# Patient Record
Sex: Male | Born: 1950 | Race: Black or African American | Hispanic: No | Smoking: Never smoker
Health system: Southern US, Community
[De-identification: ages and names within clinical notes are randomized; demographics above are authoritative.]

## PROBLEM LIST (undated history)

## (undated) DIAGNOSIS — K219 Gastro-esophageal reflux disease without esophagitis: Secondary | ICD-10-CM

## (undated) DIAGNOSIS — J449 Chronic obstructive pulmonary disease, unspecified: Secondary | ICD-10-CM

## (undated) DIAGNOSIS — D649 Anemia, unspecified: Secondary | ICD-10-CM

## (undated) DIAGNOSIS — K7030 Alcoholic cirrhosis of liver without ascites: Secondary | ICD-10-CM

## (undated) DIAGNOSIS — A419 Sepsis, unspecified organism: Secondary | ICD-10-CM

## (undated) DIAGNOSIS — K3182 Dieulafoy lesion (hemorrhagic) of stomach and duodenum: Secondary | ICD-10-CM

## (undated) DIAGNOSIS — I10 Essential (primary) hypertension: Secondary | ICD-10-CM

## (undated) DIAGNOSIS — R7303 Prediabetes: Secondary | ICD-10-CM

## (undated) DIAGNOSIS — R578 Other shock: Secondary | ICD-10-CM

## (undated) DIAGNOSIS — K922 Gastrointestinal hemorrhage, unspecified: Secondary | ICD-10-CM

## (undated) DIAGNOSIS — I4891 Unspecified atrial fibrillation: Secondary | ICD-10-CM

## (undated) DIAGNOSIS — Z87442 Personal history of urinary calculi: Secondary | ICD-10-CM

## (undated) DIAGNOSIS — N400 Enlarged prostate without lower urinary tract symptoms: Secondary | ICD-10-CM

## (undated) DIAGNOSIS — R0600 Dyspnea, unspecified: Secondary | ICD-10-CM

## (undated) DIAGNOSIS — N289 Disorder of kidney and ureter, unspecified: Secondary | ICD-10-CM

## (undated) HISTORY — DX: Other shock: R57.8

## (undated) HISTORY — DX: Essential (primary) hypertension: I10

## (undated) HISTORY — DX: Gastrointestinal hemorrhage, unspecified: K92.2

## (undated) HISTORY — DX: Dieulafoy lesion (hemorrhagic) of stomach and duodenum: K31.82

## (undated) HISTORY — DX: Benign prostatic hyperplasia without lower urinary tract symptoms: N40.0

## (undated) HISTORY — DX: Alcoholic cirrhosis of liver without ascites: K70.30

## (undated) HISTORY — DX: Gastro-esophageal reflux disease without esophagitis: K21.9

## (undated) HISTORY — PX: KIDNEY SURGERY: SHX687

---
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
## 2007-08-16 ENCOUNTER — Ambulatory Visit: Payer: Self-pay | Admitting: Nurse Practitioner

## 2007-09-08 ENCOUNTER — Ambulatory Visit: Payer: Self-pay | Admitting: Family Medicine

## 2007-09-29 ENCOUNTER — Ambulatory Visit: Payer: Self-pay | Admitting: Family Medicine

## 2009-02-05 ENCOUNTER — Ambulatory Visit: Payer: Self-pay | Admitting: Adult Health

## 2009-02-14 ENCOUNTER — Emergency Department: Payer: Self-pay | Admitting: Emergency Medicine

## 2009-02-26 ENCOUNTER — Emergency Department: Payer: Self-pay | Admitting: Emergency Medicine

## 2009-03-05 ENCOUNTER — Emergency Department: Payer: Self-pay | Admitting: Emergency Medicine

## 2009-07-19 ENCOUNTER — Ambulatory Visit: Payer: Self-pay | Admitting: Cardiology

## 2009-07-19 ENCOUNTER — Inpatient Hospital Stay (HOSPITAL_COMMUNITY): Admission: EM | Admit: 2009-07-19 | Discharge: 2009-07-21 | Payer: Self-pay | Admitting: Emergency Medicine

## 2009-08-22 ENCOUNTER — Emergency Department: Payer: Self-pay | Admitting: Emergency Medicine

## 2009-08-24 ENCOUNTER — Emergency Department: Payer: Self-pay | Admitting: Unknown Physician Specialty

## 2010-05-28 LAB — CBC
Hemoglobin: 12.5 g/dL — ABNORMAL LOW (ref 13.0–17.0)
Hemoglobin: 14 g/dL (ref 13.0–17.0)
MCHC: 35 g/dL (ref 30.0–36.0)
MCV: 99.1 fL (ref 78.0–100.0)
MCV: 99.7 fL (ref 78.0–100.0)
Platelets: 161 10*3/uL (ref 150–400)
Platelets: 197 10*3/uL (ref 150–400)
RBC: 3.47 MIL/uL — ABNORMAL LOW (ref 4.22–5.81)
RBC: 3.59 MIL/uL — ABNORMAL LOW (ref 4.22–5.81)
RDW: 13.2 % (ref 11.5–15.5)
RDW: 13.6 % (ref 11.5–15.5)
WBC: 5.4 10*3/uL (ref 4.0–10.5)
WBC: 5.9 10*3/uL (ref 4.0–10.5)

## 2010-05-28 LAB — LIPID PANEL: HDL: 58 mg/dL (ref >39)

## 2010-05-28 LAB — CK TOTAL AND CKMB (NOT AT ARMC)
CK, MB: 0.9 ng/mL (ref 0.3–4.0)
CK, MB: 0.9 ng/mL (ref 0.3–4.0)
CK, MB: 1 ng/mL (ref 0.3–4.0)
Relative Index: 0.4 (ref 0.0–2.5)
Relative Index: 0.5 (ref 0.0–2.5)
Relative Index: 0.5 (ref 0.0–2.5)
Total CK: 170 U/L (ref 7–232)
Total CK: 194 U/L (ref 7–232)

## 2010-05-28 LAB — COMPREHENSIVE METABOLIC PANEL
ALT: 30 U/L (ref 0–53)
Alkaline Phosphatase: 59 U/L (ref 39–117)
BUN: 10 mg/dL (ref 6–23)
Creatinine, Ser: 0.87 mg/dL (ref 0.4–1.5)
GFR calc non Af Amer: >60 mL/min (ref >60)
Potassium: 3.9 mEq/L (ref 3.5–5.1)
Total Bilirubin: 1.1 mg/dL (ref 0.3–1.2)
Total Protein: 7 g/dL (ref 6.0–8.3)

## 2010-05-28 LAB — D-DIMER, QUANTITATIVE: D-Dimer, Quant: <0.22 ug/mL-FEU (ref 0.00–0.48)

## 2010-05-28 LAB — TROPONIN I: Troponin I: 0.02 ng/mL (ref 0.00–0.06)

## 2010-05-28 LAB — DIFFERENTIAL
Basophils Absolute: 0 10*3/uL (ref 0.0–0.1)
Basophils Relative: 0 % (ref 0–1)
Eosinophils Absolute: 0.1 10*3/uL (ref 0.0–0.7)
Monocytes Relative: 10 % (ref 3–12)

## 2010-05-28 LAB — BASIC METABOLIC PANEL
CO2: 27 mEq/L (ref 19–32)
Creatinine, Ser: 0.87 mg/dL (ref 0.4–1.5)
Potassium: 3.2 mEq/L — ABNORMAL LOW (ref 3.5–5.1)
Sodium: 133 mEq/L — ABNORMAL LOW (ref 135–145)

## 2010-05-28 LAB — POCT I-STAT, CHEM 8
BUN: 15 mg/dL (ref 6–23)
Chloride: 105 mEq/L (ref 96–112)
HCT: 43 % (ref 39.0–52.0)
Hemoglobin: 14.6 g/dL (ref 13.0–17.0)
Sodium: 141 mEq/L (ref 135–145)
TCO2: 28 mmol/L (ref 0–100)

## 2010-05-28 LAB — POCT CARDIAC MARKERS
CKMB, poc: <1 ng/mL — ABNORMAL LOW (ref 1.0–8.0)
Myoglobin, poc: 64.3 ng/mL (ref 12–200)

## 2010-06-20 DIAGNOSIS — E785 Hyperlipidemia, unspecified: Secondary | ICD-10-CM | POA: Insufficient documentation

## 2010-06-20 LAB — PSA: PSA: 1.8

## 2010-09-10 ENCOUNTER — Ambulatory Visit: Payer: Self-pay

## 2011-02-13 DIAGNOSIS — K219 Gastro-esophageal reflux disease without esophagitis: Secondary | ICD-10-CM | POA: Insufficient documentation

## 2012-01-29 LAB — HEMOGLOBIN A1C: HEMOGLOBIN A1C: 5.4

## 2012-08-17 LAB — LIPID PANEL
CHOLESTEROL: 129 mg/dL (ref 0–200)
HDL: 91 mg/dL — AB (ref 35–70)
LDL Cholesterol: 24 mg/dL
Triglycerides: 70 mg/dL (ref 40–160)

## 2013-06-02 LAB — BASIC METABOLIC PANEL
BUN: 24 mg/dL — AB (ref 4–21)
CREATININE: 1.1 mg/dL (ref 0.6–1.3)
GLUCOSE: 101 mg/dL
POTASSIUM: 4.5 mmol/L (ref 3.4–5.3)
SODIUM: 145 mmol/L (ref 137–147)

## 2013-06-02 LAB — CBC AND DIFFERENTIAL
HCT: 42 % (ref 41–53)
HEMOGLOBIN: 13.9 g/dL (ref 13.5–17.5)
Neutrophils Absolute: 2 /uL
Platelets: 180 10*3/uL (ref 150–399)
WBC: 4.3 10*3/mL

## 2013-06-02 LAB — HEPATIC FUNCTION PANEL
ALT: 58 U/L — AB (ref 10–40)
AST: 72 U/L — AB (ref 14–40)
Alkaline Phosphatase: 78 U/L (ref 25–125)
Bilirubin, Total: 0.7 mg/dL

## 2013-06-02 LAB — TSH: TSH: 1.4 u[IU]/mL (ref 0.41–5.90)

## 2013-12-08 DIAGNOSIS — F1010 Alcohol abuse, uncomplicated: Secondary | ICD-10-CM | POA: Insufficient documentation

## 2013-12-08 DIAGNOSIS — N400 Enlarged prostate without lower urinary tract symptoms: Secondary | ICD-10-CM

## 2013-12-08 DIAGNOSIS — I10 Essential (primary) hypertension: Secondary | ICD-10-CM | POA: Insufficient documentation

## 2013-12-08 DIAGNOSIS — N401 Enlarged prostate with lower urinary tract symptoms: Secondary | ICD-10-CM | POA: Insufficient documentation

## 2014-03-20 ENCOUNTER — Ambulatory Visit: Payer: Self-pay | Admitting: Gastroenterology

## 2014-07-03 LAB — SURGICAL PATHOLOGY

## 2014-07-09 NOTE — Consult Note (Signed)
   Present Illness 64 year old male with history of hypertension and benign prostatic hypertrophy been no cardiac history he was noted to have atrial fibrillation after a colonoscopy.  Patient had been treated with metoprolol succinate and and enteric-coated aspirin as an outpatient.  He drinks approximately 1 tight of vodka today.  He was in atrial fibrillation with controlled ventricular response post procedure.  He had no chest pain or shortness of breath.  He was given 20 mg of IV Cardizem with conversion back to normal sinus rhythm.  There were no ischemic changes on his electrocardiogram.   Physical Exam:  GEN no acute distress   HEENT PERRL   NECK supple  No masses   RESP clear BS   CARD Irregular rate and rhythm  Normal, S1, S2  No murmur   ABD denies tenderness  normal BS  no Adominal Mass   LYMPH negative neck, negative axillae   EXTR negative cyanosis/clubbing, negative edema   SKIN normal to palpation   NEURO cranial nerves intact, motor/sensory function intact   PSYCH A+O to time, place, person   Review of Systems:  Subjective/Chief Complaint No chest pain or shortness of breath   General: No Complaints   Skin: No Complaints   ENT: No Complaints   Eyes: No Complaints   Neck: No Complaints   Respiratory: No Complaints   Cardiovascular: No Complaints   Gastrointestinal: No Complaints   Genitourinary: No Complaints   Vascular: No Complaints   Musculoskeletal: No Complaints   Neurologic: No Complaints   Hematologic: No Complaints   Endocrine: No Complaints   Psychiatric: No Complaints   Review of Systems: All other systems were reviewed and found to be negative   Medications/Allergies Reviewed Medications/Allergies reviewed   Family & Social History:  Family and Social History:  Family History Non-Contributory   Social History positive ETOH   Place of Living Home   EKG:  Interpretation initial atrial fibrillation with variable  ventricular response.  Converted to normal sinus rhythm with IV Cardizem.  There were no ischemic changes.    No Known Allergies:    Impression 64 year old male with history of hypertension and ethanol abuse to was noted be in atrial fibrillation post colonoscopy.  He is converted back to sinus rhythm with IV Cardizem.  He was hemodynamically stable he in atrial fibrillation and remains so in sinus rhythm.  He is currently without symptoms.  He has no chest pain or shortness of breath.  He is on metoprolol succinate at home now will continue this drug.  Will see him as an outpatient if needed.   Plan 1. Continue with current home medications including metoprolol succinate 50 mg twice daily 2. Continue with enteric-coated aspirin when acceptable from a GI standpoint 3. Reduced ethanol intake 4. Will follow-up as an outpatient if desired   Electronic Signatures: Dalia HeadingFath, Mavis Fichera A (MD)  (Signed 11-Jan-16 10:10)  Authored: General Aspect/Present Illness, History and Physical Exam, Review of System, Family & Social History, EKG , Allergies, Impression/Plan   Last Updated: 11-Jan-16 10:10 by Dalia HeadingFath, Jenniferlynn Saad A (MD)

## 2014-12-14 ENCOUNTER — Other Ambulatory Visit: Payer: Self-pay | Admitting: Family Medicine

## 2014-12-14 DIAGNOSIS — R748 Abnormal levels of other serum enzymes: Secondary | ICD-10-CM

## 2014-12-21 ENCOUNTER — Ambulatory Visit
Admission: RE | Admit: 2014-12-21 | Discharge: 2014-12-21 | Disposition: A | Discharge status: Home / Self Care | Payer: Medicare HMO | Source: Ambulatory Visit | Attending: Family Medicine | Admitting: Family Medicine

## 2014-12-21 DIAGNOSIS — R748 Abnormal levels of other serum enzymes: Secondary | ICD-10-CM | POA: Diagnosis present

## 2014-12-21 DIAGNOSIS — K8020 Calculus of gallbladder without cholecystitis without obstruction: Secondary | ICD-10-CM | POA: Insufficient documentation

## 2015-07-19 DIAGNOSIS — K219 Gastro-esophageal reflux disease without esophagitis: Secondary | ICD-10-CM

## 2015-07-19 DIAGNOSIS — E785 Hyperlipidemia, unspecified: Secondary | ICD-10-CM

## 2018-02-15 ENCOUNTER — Emergency Department: Payer: Medicare HMO | Admitting: Registered Nurse

## 2018-02-15 ENCOUNTER — Inpatient Hospital Stay
Admission: EM | Admit: 2018-02-15 | Discharge: 2018-02-19 | DRG: 377 | Disposition: A | Discharge status: Home / Self Care | Payer: Medicare HMO | Attending: Internal Medicine | Admitting: Internal Medicine

## 2018-02-15 ENCOUNTER — Emergency Department: Payer: Medicare HMO

## 2018-02-15 ENCOUNTER — Encounter
Admission: EM | Disposition: A | Discharge status: Home / Self Care | Payer: Self-pay | Source: Home / Self Care | Attending: Family Medicine

## 2018-02-15 ENCOUNTER — Encounter: Payer: Self-pay | Admitting: Emergency Medicine

## 2018-02-15 ENCOUNTER — Other Ambulatory Visit: Payer: Self-pay

## 2018-02-15 DIAGNOSIS — D6959 Other secondary thrombocytopenia: Secondary | ICD-10-CM | POA: Diagnosis present

## 2018-02-15 DIAGNOSIS — K8020 Calculus of gallbladder without cholecystitis without obstruction: Secondary | ICD-10-CM | POA: Diagnosis present

## 2018-02-15 DIAGNOSIS — K922 Gastrointestinal hemorrhage, unspecified: Secondary | ICD-10-CM | POA: Diagnosis not present

## 2018-02-15 DIAGNOSIS — N400 Enlarged prostate without lower urinary tract symptoms: Secondary | ICD-10-CM | POA: Diagnosis present

## 2018-02-15 DIAGNOSIS — I4891 Unspecified atrial fibrillation: Secondary | ICD-10-CM | POA: Diagnosis not present

## 2018-02-15 DIAGNOSIS — F10239 Alcohol dependence with withdrawal, unspecified: Secondary | ICD-10-CM | POA: Diagnosis present

## 2018-02-15 DIAGNOSIS — R578 Other shock: Secondary | ICD-10-CM | POA: Diagnosis present

## 2018-02-15 DIAGNOSIS — Z23 Encounter for immunization: Secondary | ICD-10-CM | POA: Diagnosis not present

## 2018-02-15 DIAGNOSIS — Z8249 Family history of ischemic heart disease and other diseases of the circulatory system: Secondary | ICD-10-CM

## 2018-02-15 DIAGNOSIS — K2211 Ulcer of esophagus with bleeding: Secondary | ICD-10-CM | POA: Diagnosis present

## 2018-02-15 DIAGNOSIS — K219 Gastro-esophageal reflux disease without esophagitis: Secondary | ICD-10-CM | POA: Diagnosis present

## 2018-02-15 DIAGNOSIS — G9340 Encephalopathy, unspecified: Secondary | ICD-10-CM | POA: Diagnosis present

## 2018-02-15 DIAGNOSIS — F1010 Alcohol abuse, uncomplicated: Secondary | ICD-10-CM

## 2018-02-15 DIAGNOSIS — K3182 Dieulafoy lesion (hemorrhagic) of stomach and duodenum: Principal | ICD-10-CM | POA: Diagnosis present

## 2018-02-15 DIAGNOSIS — K921 Melena: Secondary | ICD-10-CM

## 2018-02-15 DIAGNOSIS — I10 Essential (primary) hypertension: Secondary | ICD-10-CM | POA: Diagnosis present

## 2018-02-15 DIAGNOSIS — Z79899 Other long term (current) drug therapy: Secondary | ICD-10-CM | POA: Diagnosis not present

## 2018-02-15 DIAGNOSIS — K449 Diaphragmatic hernia without obstruction or gangrene: Secondary | ICD-10-CM | POA: Diagnosis present

## 2018-02-15 DIAGNOSIS — D62 Acute posthemorrhagic anemia: Secondary | ICD-10-CM | POA: Diagnosis present

## 2018-02-15 DIAGNOSIS — E872 Acidosis: Secondary | ICD-10-CM | POA: Diagnosis present

## 2018-02-15 DIAGNOSIS — N179 Acute kidney failure, unspecified: Secondary | ICD-10-CM | POA: Diagnosis present

## 2018-02-15 DIAGNOSIS — I248 Other forms of acute ischemic heart disease: Secondary | ICD-10-CM | POA: Diagnosis present

## 2018-02-15 DIAGNOSIS — Z7982 Long term (current) use of aspirin: Secondary | ICD-10-CM

## 2018-02-15 DIAGNOSIS — D696 Thrombocytopenia, unspecified: Secondary | ICD-10-CM

## 2018-02-15 HISTORY — DX: Gastrointestinal hemorrhage, unspecified: K92.2

## 2018-02-15 HISTORY — PX: ESOPHAGOGASTRODUODENOSCOPY: SHX5428

## 2018-02-15 LAB — CBC
HEMATOCRIT: 24 % — AB (ref 39.0–52.0)
Hemoglobin: 7.9 g/dL — ABNORMAL LOW (ref 13.0–17.0)
MCH: 37.3 pg — ABNORMAL HIGH (ref 26.0–34.0)
MCHC: 32.9 g/dL (ref 30.0–36.0)
MCV: 113.2 fL — ABNORMAL HIGH (ref 80.0–100.0)
Platelets: 134 10*3/uL — ABNORMAL LOW (ref 150–400)
RBC: 2.12 MIL/uL — ABNORMAL LOW (ref 4.22–5.81)
RDW: 13 % (ref 11.5–15.5)
WBC: 8.7 10*3/uL (ref 4.0–10.5)
nRBC: 0 % (ref 0.0–0.2)

## 2018-02-15 LAB — DIFFERENTIAL
Abs Immature Granulocytes: 0.04 10*3/uL (ref 0.00–0.07)
Basophils Absolute: 0 10*3/uL (ref 0.0–0.1)
Basophils Relative: 1 %
Eosinophils Absolute: 0.2 10*3/uL (ref 0.0–0.5)
Eosinophils Relative: 2 %
Immature Granulocytes: 1 %
LYMPHS ABS: 1.2 10*3/uL (ref 0.7–4.0)
LYMPHS PCT: 14 %
Monocytes Absolute: 0.8 10*3/uL (ref 0.1–1.0)
Monocytes Relative: 9 %
Neutro Abs: 6.4 10*3/uL (ref 1.7–7.7)
Neutrophils Relative %: 73 %
Smear Review: NORMAL

## 2018-02-15 LAB — HEMOGLOBIN AND HEMATOCRIT, BLOOD
HCT: 37.8 % — ABNORMAL LOW (ref 39.0–52.0)
Hemoglobin: 12.6 g/dL — ABNORMAL LOW (ref 13.0–17.0)

## 2018-02-15 LAB — PROTIME-INR
INR: 1.29
Prothrombin Time: 16 seconds — ABNORMAL HIGH (ref 11.4–15.2)

## 2018-02-15 LAB — PROCALCITONIN: PROCALCITONIN: 0.19 ng/mL

## 2018-02-15 LAB — COMPREHENSIVE METABOLIC PANEL
ALK PHOS: 77 U/L (ref 38–126)
ALT: 32 U/L (ref 0–44)
AST: 54 U/L — ABNORMAL HIGH (ref 15–41)
Albumin: 3.1 g/dL — ABNORMAL LOW (ref 3.5–5.0)
Anion gap: 10 (ref 5–15)
BUN: 63 mg/dL — ABNORMAL HIGH (ref 8–23)
CALCIUM: 9 mg/dL (ref 8.9–10.3)
CHLORIDE: 110 mmol/L (ref 98–111)
CO2: 22 mmol/L (ref 22–32)
CREATININE: 1.36 mg/dL — AB (ref 0.61–1.24)
GFR calc Af Amer: >60 mL/min (ref >60)
GFR, EST NON AFRICAN AMERICAN: 53 mL/min — AB (ref >60)
Glucose, Bld: 172 mg/dL — ABNORMAL HIGH (ref 70–99)
Potassium: 3.7 mmol/L (ref 3.5–5.1)
Sodium: 142 mmol/L (ref 135–145)
Total Bilirubin: 1.6 mg/dL — ABNORMAL HIGH (ref 0.3–1.2)
Total Protein: 6 g/dL — ABNORMAL LOW (ref 6.5–8.1)

## 2018-02-15 LAB — AMMONIA: Ammonia: 18 umol/L (ref 9–35)

## 2018-02-15 LAB — TROPONIN I
Troponin I: 0.13 ng/mL (ref <0.03)
Troponin I: 0.15 ng/mL (ref <0.03)

## 2018-02-15 LAB — CG4 I-STAT (LACTIC ACID): LACTIC ACID, VENOUS: 5.58 mmol/L — AB (ref 0.5–1.9)

## 2018-02-15 LAB — APTT: APTT: 27 s (ref 24–36)

## 2018-02-15 LAB — ABO/RH: ABO/RH(D): B POS

## 2018-02-15 LAB — LACTIC ACID, PLASMA: Lactic Acid, Venous: 3.7 mmol/L (ref 0.5–1.9)

## 2018-02-15 LAB — MRSA PCR SCREENING: MRSA by PCR: NEGATIVE

## 2018-02-15 SURGERY — EGD (ESOPHAGOGASTRODUODENOSCOPY)
Anesthesia: General

## 2018-02-15 MED ORDER — LIDOCAINE HCL (PF) 2 % IJ SOLN
INTRAMUSCULAR | Status: AC
  Filled 2018-02-15: qty 10

## 2018-02-15 MED ORDER — DEXMEDETOMIDINE HCL IN NACL 400 MCG/100ML IV SOLN
0.2000 ug/kg/h | INTRAVENOUS | Status: DC
  Administered 2018-02-15: 0.3 ug/kg/h via INTRAVENOUS
  Filled 2018-02-15: qty 100

## 2018-02-15 MED ORDER — FENTANYL CITRATE (PF) 100 MCG/2ML IJ SOLN
INTRAMUSCULAR | Status: DC | PRN
  Administered 2018-02-15: 50 ug via INTRAVENOUS

## 2018-02-15 MED ORDER — PROPOFOL 10 MG/ML IV BOLUS
INTRAVENOUS | Status: AC
  Filled 2018-02-15: qty 20

## 2018-02-15 MED ORDER — DEXAMETHASONE SODIUM PHOSPHATE 10 MG/ML IJ SOLN
INTRAMUSCULAR | Status: DC | PRN
  Administered 2018-02-15: 5 mg via INTRAVENOUS

## 2018-02-15 MED ORDER — PHENYLEPHRINE HCL 10 MG/ML IJ SOLN
INTRAMUSCULAR | Status: DC | PRN
  Administered 2018-02-15 (×4): 100 ug via INTRAVENOUS

## 2018-02-15 MED ORDER — SODIUM CHLORIDE 0.9 % IV BOLUS
500.0000 mL | Freq: Once | INTRAVENOUS | Status: AC
  Administered 2018-02-15: 500 mL via INTRAVENOUS

## 2018-02-15 MED ORDER — MIDAZOLAM HCL 2 MG/2ML IJ SOLN
INTRAMUSCULAR | Status: AC
  Filled 2018-02-15: qty 2

## 2018-02-15 MED ORDER — ADULT MULTIVITAMIN W/MINERALS CH
1.0000 | ORAL_TABLET | Freq: Every day | ORAL | Status: DC
  Administered 2018-02-17 – 2018-02-19 (×3): 1 via ORAL
  Filled 2018-02-15 (×3): qty 1

## 2018-02-15 MED ORDER — FOLIC ACID 1 MG PO TABS
1.0000 mg | ORAL_TABLET | Freq: Every day | ORAL | Status: DC
  Administered 2018-02-17 – 2018-02-19 (×3): 1 mg via ORAL
  Filled 2018-02-15 (×3): qty 1

## 2018-02-15 MED ORDER — LIDOCAINE HCL (CARDIAC) PF 100 MG/5ML IV SOSY
PREFILLED_SYRINGE | INTRAVENOUS | Status: DC | PRN
  Administered 2018-02-15: 80 mg via INTRAVENOUS

## 2018-02-15 MED ORDER — LORAZEPAM 1 MG PO TABS: 1.0000 mg | ORAL_TABLET | Freq: Four times a day (QID) | ORAL | Status: DC | PRN

## 2018-02-15 MED ORDER — PROPOFOL 10 MG/ML IV BOLUS
INTRAVENOUS | Status: DC | PRN
  Administered 2018-02-15: 100 mg via INTRAVENOUS

## 2018-02-15 MED ORDER — PANTOPRAZOLE SODIUM 40 MG IV SOLR: 40.0000 mg | Freq: Two times a day (BID) | INTRAVENOUS | Status: DC

## 2018-02-15 MED ORDER — OCTREOTIDE LOAD VIA INFUSION
50.0000 ug | Freq: Once | INTRAVENOUS | Status: DC
  Filled 2018-02-15: qty 25

## 2018-02-15 MED ORDER — VITAMIN B-1 100 MG PO TABS
100.0000 mg | ORAL_TABLET | Freq: Every day | ORAL | Status: DC
  Administered 2018-02-17 – 2018-02-19 (×3): 100 mg via ORAL
  Filled 2018-02-15 (×3): qty 1

## 2018-02-15 MED ORDER — ALBUTEROL SULFATE (2.5 MG/3ML) 0.083% IN NEBU: 2.5000 mg | INHALATION_SOLUTION | RESPIRATORY_TRACT | Status: DC

## 2018-02-15 MED ORDER — ONDANSETRON HCL 4 MG/2ML IJ SOLN
INTRAMUSCULAR | Status: AC
  Administered 2018-02-15: 4 mg
  Filled 2018-02-15: qty 2

## 2018-02-15 MED ORDER — DEXAMETHASONE SODIUM PHOSPHATE 10 MG/ML IJ SOLN
INTRAMUSCULAR | Status: AC
  Filled 2018-02-15: qty 1

## 2018-02-15 MED ORDER — SODIUM CHLORIDE 0.9 % IV SOLN
10.0000 mL/h | Freq: Once | INTRAVENOUS | Status: AC
  Administered 2018-02-15: 10 mL/h via INTRAVENOUS

## 2018-02-15 MED ORDER — SODIUM CHLORIDE 0.9 % IV SOLN
50.0000 ug/h | INTRAVENOUS | Status: DC
  Administered 2018-02-15 – 2018-02-19 (×7): 50 ug/h via INTRAVENOUS
  Filled 2018-02-15 (×13): qty 1

## 2018-02-15 MED ORDER — ACETAMINOPHEN 325 MG PO TABS: 650.0000 mg | ORAL_TABLET | ORAL | Status: DC | PRN

## 2018-02-15 MED ORDER — THIAMINE HCL 100 MG/ML IJ SOLN
100.0000 mg | Freq: Once | INTRAMUSCULAR | Status: AC
  Administered 2018-02-15: 100 mg via INTRAVENOUS
  Filled 2018-02-15 (×2): qty 2

## 2018-02-15 MED ORDER — LORAZEPAM 2 MG/ML IJ SOLN: 1.0000 mg | INTRAMUSCULAR | Status: DC | PRN

## 2018-02-15 MED ORDER — FENTANYL CITRATE (PF) 100 MCG/2ML IJ SOLN
INTRAMUSCULAR | Status: AC
  Filled 2018-02-15: qty 2

## 2018-02-15 MED ORDER — ONDANSETRON HCL 4 MG/2ML IJ SOLN
INTRAMUSCULAR | Status: AC
  Filled 2018-02-15: qty 2

## 2018-02-15 MED ORDER — SODIUM CHLORIDE 0.9 % IV SOLN
8.0000 mg/h | INTRAVENOUS | Status: AC
  Administered 2018-02-15 – 2018-02-18 (×6): 8 mg/h via INTRAVENOUS
  Filled 2018-02-15 (×8): qty 80

## 2018-02-15 MED ORDER — THIAMINE HCL 100 MG/ML IJ SOLN
100.0000 mg | Freq: Every day | INTRAMUSCULAR | Status: DC
  Administered 2018-02-15 – 2018-02-16 (×2): 100 mg via INTRAVENOUS
  Filled 2018-02-15: qty 1
  Filled 2018-02-15: qty 2
  Filled 2018-02-15: qty 1

## 2018-02-15 MED ORDER — SODIUM CHLORIDE 0.9 % IV BOLUS
1000.0000 mL | Freq: Once | INTRAVENOUS | Status: AC
  Administered 2018-02-15: 1000 mL via INTRAVENOUS

## 2018-02-15 MED ORDER — SUCCINYLCHOLINE CHLORIDE 20 MG/ML IJ SOLN
INTRAMUSCULAR | Status: DC | PRN
  Administered 2018-02-15: 100 mg via INTRAVENOUS

## 2018-02-15 MED ORDER — ALBUTEROL SULFATE (2.5 MG/3ML) 0.083% IN NEBU: 2.5000 mg | INHALATION_SOLUTION | RESPIRATORY_TRACT | Status: DC | PRN

## 2018-02-15 MED ORDER — TAMSULOSIN HCL 0.4 MG PO CAPS
0.4000 mg | ORAL_CAPSULE | Freq: Every day | ORAL | Status: DC
  Administered 2018-02-17 – 2018-02-18 (×2): 0.4 mg via ORAL
  Filled 2018-02-15 (×2): qty 1

## 2018-02-15 MED ORDER — LACTATED RINGERS IV SOLN
INTRAVENOUS | Status: DC | PRN
  Administered 2018-02-15: 19:00:00 via INTRAVENOUS

## 2018-02-15 MED ORDER — LORAZEPAM 2 MG/ML IJ SOLN
1.0000 mg | Freq: Four times a day (QID) | INTRAMUSCULAR | Status: DC | PRN
  Administered 2018-02-15: 1 mg via INTRAVENOUS
  Filled 2018-02-15: qty 1

## 2018-02-15 MED ORDER — ONDANSETRON HCL 4 MG/2ML IJ SOLN
INTRAMUSCULAR | Status: DC | PRN
  Administered 2018-02-15: 4 mg via INTRAVENOUS

## 2018-02-15 MED ORDER — SODIUM CHLORIDE 0.9 % IV SOLN
1.0000 g | INTRAVENOUS | Status: DC
  Administered 2018-02-15: 1 g via INTRAVENOUS
  Filled 2018-02-15: qty 10
  Filled 2018-02-15: qty 1

## 2018-02-15 MED ORDER — ONDANSETRON HCL 4 MG/2ML IJ SOLN: 4.0000 mg | Freq: Four times a day (QID) | INTRAMUSCULAR | Status: DC | PRN

## 2018-02-15 MED ORDER — SODIUM CHLORIDE 0.9 % IV SOLN
80.0000 mg | Freq: Once | INTRAVENOUS | Status: DC
  Filled 2018-02-15 (×3): qty 80

## 2018-02-15 MED ORDER — IPRATROPIUM BROMIDE 0.02 % IN SOLN
0.5000 mg | RESPIRATORY_TRACT | Status: DC
  Administered 2018-02-16 – 2018-02-17 (×11): 0.5 mg via RESPIRATORY_TRACT
  Filled 2018-02-15 (×11): qty 2.5

## 2018-02-15 MED ORDER — SODIUM CHLORIDE 0.9 % IV SOLN: 1.0000 g | Freq: Once | INTRAVENOUS | Status: DC

## 2018-02-15 MED ORDER — SODIUM CHLORIDE 0.9 % IV SOLN
INTRAVENOUS | Status: DC
  Administered 2018-02-15 – 2018-02-18 (×5): via INTRAVENOUS

## 2018-02-15 MED ORDER — MIDAZOLAM HCL 2 MG/2ML IJ SOLN
INTRAMUSCULAR | Status: DC | PRN
  Administered 2018-02-15: 1 mg via INTRAVENOUS

## 2018-02-15 NOTE — Transfer of Care (Signed)
Immediate Anesthesia Transfer of Care Note  Patient: Marvin Ortega  Procedure(s) Performed: ESOPHAGOGASTRODUODENOSCOPY (EGD) (N/A )  Patient Location: PACU and ICU  Anesthesia Type:General  Level of Consciousness: awake and pateint uncooperative  Airway & Oxygen Therapy: Patient Spontanous Breathing and Patient connected to face mask oxygen  Post-op Assessment: Report given to RN and Post -op Vital signs reviewed and stable  Post vital signs: Reviewed and stable  Last Vitals:  Vitals Value Taken Time  BP 142/99 02/15/2018 2025  Temp    Pulse 107   Resp 20   SpO2 96%     Last Pain:  Vitals:   02/15/18 1800  TempSrc: Oral  PainSc:          Complications: No apparent anesthesia complications

## 2018-02-15 NOTE — Op Note (Signed)
[facility] Gastroenterology Patient Name: Marvin Ortega Procedure Date: 02/15/2018 6:57 PM MRN: [id number] Account #: [id number] Date of Birth: [birth year] Admit Type: Outpatient Age: 67 Room: [facility] ENDO ROOM 1 Gender: Male Note Status: Finalized Procedure:            Upper GI endoscopy Indications:          Acute post hemorrhagic anemia, Coffee-ground emesis,                        Melena, Active gastrointestinal bleeding, Suspected                        upper gastrointestinal bleeding Providers:            Toney Reil MD, MD Referring MD:         No Local Md, MD (Referring MD) Medicines:            Monitored Anesthesia Care Complications:        No immediate complications. Estimated blood loss:                        Minimal. Procedure:            Pre-Anesthesia Assessment:                       - Prior to the procedure, a History and Physical was                        performed, and patient medications and allergies were                        reviewed. The patient is competent. The risks and                        benefits of the procedure and the sedation options and                        risks were discussed with the patient. All questions                        were answered and informed consent was obtained.                        Patient identification and proposed procedure were                        verified by the physician, the nurse, the                        anesthesiologist, the anesthetist and the technician in                        the pre-procedure area in the procedure room in the                        endoscopy suite. Mental Status Examination: alert and                        oriented. Airway Examination: normal oropharyngeal  airway and neck mobility. Respiratory Examination:                        clear to auscultation. CV Examination: normal.                        Prophylactic Antibiotics: The patient  does not require                        prophylactic antibiotics. Prior Anticoagulants: The                        patient has taken no previous anticoagulant or                        antiplatelet agents. ASA Grade Assessment: IV - A                        patient with severe systemic disease that is a constant                        threat to life. After reviewing the risks and benefits,                        the patient was deemed in satisfactory condition to                        undergo the procedure. The anesthesia plan was to use                        general anesthesia. Immediately prior to administration                        of medications, the patient was re-assessed for                        adequacy to receive sedatives. The heart rate,                        respiratory rate, oxygen saturations, blood pressure,                        adequacy of pulmonary ventilation, and response to care                        were monitored throughout the procedure. The physical                        status of the patient was re-assessed after the                        procedure.                       After obtaining informed consent, the endoscope was                        passed under direct vision. Throughout the procedure,                        the patient's  blood pressure, pulse, and oxygen                        saturations were monitored continuously. The Endoscope                        was introduced through the mouth, and advanced to the                        second part of duodenum. The upper GI endoscopy was                        accomplished without difficulty. The patient tolerated                        the procedure well. Findings:      The duodenal bulb and second portion of the duodenum were normal.      Hematin (altered blood/coffee-ground-like material) was found in the       entire examined stomach. Lavage with suction was performed using 50 -       200 mL  of sterile water, resulting in clearance with adequate       visualization.      A Dieulafoy lesion with spurting bleeding and was found in the cardia in       the hiatal hernia. Area was injected with 2 mL of a 1:10,000 solution of       epinephrine for hemostasis, bleeding slowed. For hemostasis, three       hemostatic clips were successfully placed (MR conditional). There was no       bleeding at the end of the procedure.      A small hiatal hernia was present.      LA Grade D (one or more mucosal breaks involving at least 75% of       esophageal circumference) esophagitis with no bleeding was found in the       lower third of the esophagus. Impression:           - Normal duodenal bulb and second portion of the                        duodenum.                       - Hematin (altered blood/coffee-ground-like material)                        in the entire stomach.                       - Dieulafoy lesion of stomach.                       - Small hiatal hernia.                       - LA Grade D erosive esophagitis.                       - No specimens collected. Recommendation:       - Transport patient to ER.                       -  NPO today.                       - Give Protonix (pantoprazole): 8 mg/hr IV by                        continuous infusion today.                       - Check hemogram with white blood cell count and                        platelets q 6 hours for one day, goal Hb > 8.                       - Administer an IV bolus of 50 micrograms of octreotide                        followed by an infusion of 50 micrograms per hour today. Procedure Code(s):    --- Professional ---                       [phone], Esophagogastroduodenoscopy, flexible, transoral;                        with control of bleeding, any method Diagnosis Code(s):    --- Professional ---                       K92.2, Gastrointestinal hemorrhage, unspecified                       K31.82, Dieulafoy  lesion (hemorrhagic) of stomach and                        duodenum                       K44.9, Diaphragmatic hernia without obstruction or                        gangrene                       K20.8, Other esophagitis                       D62, Acute posthemorrhagic anemia                       K92.0, Hematemesis                       K92.1, Melena (includes Hematochezia) CPT copyright 2018 American Medical Association. All rights reserved. The codes documented in this report are preliminary and upon coder review may  be revised to meet current compliance requirements. Dr. Libby Mawonini Vanga Toney Reilohini Reddy Vanga MD, MD 02/15/2018 8:02:20 PM This report has been signed electronically. Number of Addenda: 0 Note Initiated On: 02/15/2018 6:57 PM      [facility]

## 2018-02-15 NOTE — Progress Notes (Signed)
eLink Physician-Brief Progress Note Patient Name: [name] DOB: 08-11-1950 MRN: [id number]   Date of Service  02/15/2018  HPI/Events of Note  67 yo male with PMH of ETOH abuse. Presents with melena and coffee ground emesis. EGD --> Dieulafoy lesion of stomach which has been injected with epinephrine and clipped with good hemostasis. Now admitted to ICU. PCCM asked to assume care. VSS.  eICU Interventions  No new orders.      Intervention Category Evaluation Type: New Patient Evaluation  Lenell AntuSommer,Steven Eugene 02/15/2018, 8:59 PM

## 2018-02-15 NOTE — ED Triage Notes (Addendum)
Sent by PCP for black stools since yesterday AM.  Has had 10 black stools today. No blood thinners.  + weakness, dizziness, SHOB/CP, headache.  Hypotensive in triage,

## 2018-02-15 NOTE — ED Provider Notes (Signed)
[facility] Emergency Department Provider Note  ____________________________________________  Time seen: Approximately 6:48 PM  I have reviewed the triage vital signs and the nursing notes.   HISTORY  Chief Complaint Abnormal Lab   HPI Marvin Ortega is a 67 y.o. male with a history of alcohol abuse who presents for evaluation of upper GI bleed.  Patient reports 20 episodes of black tarry stools since yesterday.  Today started feeling dizzy like he is going to pass out which prompted his visit to his primary care doctor.  At the PCPs office patient was found to have a new hemoglobin of 8 and tachycardia with heart rate of 130s and he was transferred to the emergency room for evaluation.  Patient denies any vomiting but has had nausea.  No abdominal pain.  He takes an aspirin but no other NSAIDs.  Drinks about a gallon of vodka a day.  No known history of cirrhosis or varices.  Has never undergone colonoscopy or endoscopy.  Last alcohol was yesterday evening.  Past Medical History:  Diagnosis Date  . BPH (benign prostatic hyperplasia)   . GERD (gastroesophageal reflux disease)   . Hypertension     Patient Active Problem List   Diagnosis Date Noted  . 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 Essential (primary) hypertension 12/08/2013  . AA (alcohol abuse) 12/08/2013  . GERD (gastroesophageal reflux disease) 02/13/2011  . Hyperlipidemia 06/20/2010    History reviewed. No pertinent surgical history.  Prior to Admission medications   Medication Sig Start Date End Date Taking? Authorizing Provider  aspirin EC 81 MG tablet Take 81 mg by mouth daily.   Yes [provider]  famotidine (PEPCID) 20 MG tablet Take 20 mg by mouth 2 (two) times daily. 02/10/18  Yes [provider]  finasteride (PROSCAR) 5 MG tablet Take 5 mg by mouth daily.   Yes [provider]  metoprolol succinate (TOPROL-XL) 50 MG 24 hr tablet Take 100 mg by mouth  daily. 02/13/18  Yes [provider]  silodosin (RAPAFLO) 8 MG CAPS capsule Take 8 mg by mouth daily. 01/26/18  Yes [provider]  Thiamine HCl (VITAMIN B-1) 250 MG tablet Take 250 mg by mouth daily.   Yes [provider]    Allergies Patient has no known allergies.  Family History  Problem Relation Age of Onset  . Heart disease Father   . Aneurysm Sister     Social History Social History   Tobacco Use  . Smoking status: Never Smoker  . Smokeless tobacco: Never Used  Substance Use Topics  . Alcohol use: Yes  . Drug use: Not on file    Review of Systems  Constitutional: Negative for fever. + Lightheadedness Eyes: Negative for visual changes. ENT: Negative for sore throat. Neck: No neck pain  Cardiovascular: Negative for chest pain. Respiratory: Negative for shortness of breath. Gastrointestinal: Negative for abdominal pain, vomiting. + nausea, melena. Genitourinary: Negative for dysuria. Musculoskeletal: Negative for back pain. Skin: Negative for rash. Neurological: Negative for headaches, weakness or numbness. Psych: No SI or HI  ____________________________________________   PHYSICAL EXAM:  VITAL SIGNS: ED Triage Vitals  Enc Vitals Group     BP 02/15/18 1646 (!) 88/47     Pulse Rate 02/15/18 1646 (!) 200     Resp 02/15/18 1646 18     Temp 02/15/18 1646 98.5 F (36.9 C)     Temp Source 02/15/18 1646 Oral     SpO2 02/15/18 1646 96 %  Weight 02/15/18 1642 143 lb (64.9 kg)     Height 02/15/18 1642 5\' 6"  (1.676 m)     Head Circumference --      Peak Flow --      Pain Score 02/15/18 1641 8     Pain Loc --      Pain Edu? --      Excl. in GC? --     Constitutional: Alert and oriented. HEENT:      Head: Normocephalic and atraumatic.         Eyes: Conjunctivae are normal. Sclera is non-icteric.       Mouth/Throat: Mucous membranes are moist.       Neck: Supple with no signs of meningismus. Cardiovascular: Irregularly  irregular rhythm with tachycardic rate. No murmurs, gallops, or rubs. 2+ symmetrical distal pulses are present in all extremities. No JVD. Respiratory: Normal respiratory effort. Lungs are clear to auscultation bilaterally. No wheezes, crackles, or rhonchi.  Gastrointestinal: Soft, non tender, and non distended with positive bowel sounds. No rebound or guarding. Genitourinary: Rectal exam with gross melena Musculoskeletal: Nontender with normal range of motion in all extremities. No edema, cyanosis, or erythema of extremities. Neurologic: Normal speech and language. Face is symmetric. Moving all extremities. No gross focal neurologic deficits are appreciated. Skin: Skin is warm, dry and intact. No rash noted. Psychiatric: Mood and affect are normal. Speech and behavior are normal.  ____________________________________________   LABS (all labs ordered are listed, but only abnormal results are displayed)  Labs Reviewed  COMPREHENSIVE METABOLIC PANEL - Abnormal; Notable for the following components:      Result Value   Glucose, Bld 172 (*)    BUN 63 (*)    Creatinine, Ser 1.36 (*)    Total Protein 6.0 (*)    Albumin 3.1 (*)    AST 54 (*)    Total Bilirubin 1.6 (*)    GFR calc non Af Amer 53 (*)    All other components within normal limits  CBC - Abnormal; Notable for the following components:   RBC 2.12 (*)    Hemoglobin 7.9 (*)    HCT 24.0 (*)    MCV 113.2 (*)    MCH 37.3 (*)    Platelets 134 (*)    All other components within normal limits  TROPONIN I - Abnormal; Notable for the following components:   Troponin I 0.13 (*)    All other components within normal limits  PROTIME-INR - Abnormal; Notable for the following components:   Prothrombin Time 16.0 (*)    All other components within normal limits  CG4 I-STAT (LACTIC ACID) - Abnormal; Notable for the following components:   Lactic Acid, Venous 5.58 (*)    All other components within normal limits  APTT  AMMONIA    DIFFERENTIAL  POC OCCULT BLOOD, ED  I-STAT CG4 LACTIC ACID, ED  TYPE AND SCREEN  PREPARE RBC (CROSSMATCH)   ____________________________________________  EKG  ED ECG REPORT I, Nita Sickle, the attending physician, personally viewed and interpreted this ECG.  Atrial fibrillation with ventricular rate of 189, normal QTC and normal axis, diffuse ST depressions with no ST elevation. New from prior ____________________________________________  RADIOLOGY  none  ____________________________________________   PROCEDURES  Procedure(s) performed: None Procedures Critical Care performed: yes  CRITICAL CARE Performed by: Nita Sickle  ?  Total critical care time: 60 min  Critical care time was exclusive of separately billable procedures and treating other patients.  Critical care was necessary to treat or  prevent imminent or life-threatening deterioration.  Critical care was time spent personally by me on the following activities: development of treatment plan with patient and/or surrogate as well as nursing, discussions with consultants, evaluation of patient's response to treatment, examination of patient, obtaining history from patient or surrogate, ordering and performing treatments and interventions, ordering and review of laboratory studies, ordering and review of radiographic studies, pulse oximetry and re-evaluation of patient's condition.  ____________________________________________   INITIAL IMPRESSION / ASSESSMENT AND PLAN / ED COURSE  67 y.o. male with a history of alcohol abuse who presents for evaluation of upper GI bleed.  Patient with history of alcohol abuse and more than 20 episodes of melena since yesterday.  Arrives with new A. fib with RVR and hypotension concerning for hemorrhagic shock.  Rectal exam showing gross melena.    Patient was given 2 units of emergent release blood and a liter of fluid.  His labs came back with a hemoglobin of 7.91  patient's baseline is 14.  After receiving the first 2 units of emergent release blood patient's blood pressure improved to however he remained in A. fib with RVR.  He then had one episode of coffee-ground emesis of about 400 cc. 2 more units of pRBCs were started. Zofran given. Octreotide, protonix, and rocephin given for concerns of variceal bleed. Dr. Allegra LaiVanga from GI was consulted and agrees with management. She is calling in anesthesia for emergent endoscopy.   Clinical Course as of Feb 15 1853  [facility]  [id number] Patient is stabilizing, flipped back into sinus rhythm with a rate of 117.  Blood pressure is now 123/80. Patient being transferred to endoscopy suite for emergent endoscopy with Dr. Allegra LaiVanga. Discussed with Dr. Luberta MutterKonidena and Dr. Katheren ShamsSalary for ICU admission after procedure. ICU is holding a bed for this patient.    [CV]    Clinical Course User Index [CV] Don PerkingVeronese, [place], MD     As part of my medical decision making, I reviewed the following data within the electronic MEDICAL RECORD NUMBER History obtained from family, Nursing notes reviewed and incorporated, Labs reviewed , EKG interpreted , Old EKG reviewed, Old chart reviewed, Discussed with admitting physician , A consult was requested and obtained from this/these consultant(s) GI, Notes from prior ED visits and [place] Controlled Substance Database    Pertinent labs & imaging results that were available during my care of the patient were reviewed by me and considered in my medical decision making (see chart for details).    ____________________________________________   FINAL CLINICAL IMPRESSION(S) / ED DIAGNOSES  Final diagnoses:  Hemorrhagic shock (HCC)  Upper GI bleed  Thrombocytopenia (HCC)  Alcohol abuse  Atrial fibrillation with RVR (HCC)  Demand ischemia of myocardium (HCC)      NEW MEDICATIONS STARTED DURING THIS VISIT:  ED Discharge Orders    None       Note:  This document was prepared using Dragon voice  recognition software and may include unintentional dictation errors.    Nita SickleVeronese, [place], MD 02/15/18 [phone]

## 2018-02-15 NOTE — Consult Note (Addendum)
Marvin Darby, MD [address]  [place], [place] [address]  Main: [phone]  Fax: [phone] Pager: [phone]   Consultation  Referring Provider:     No ref. provider found Primary Care Physician:  Derinda Late, MD Primary Gastroenterologist: None       Reason for Consultation:     Acute upper GI bleed, melena  Date of Admission:  02/15/2018 Date of Consultation:  02/15/2018         HPI:   Marvin Ortega is a 67 y.o. African-American male with heavy alcohol use, drinks about 1 gallon of vodka daily who presented to ER with hypotension, tachycardia and several episodes of melena that started since yesterday morning.  Patient had one episode of coffee-ground emesis, 400 cc in the ER.  He initially went to see his PCP, where he was found to have heart rate in 130s, hemoglobin 8 and he was transferred to the ER.  He had approximately 20 episodes of black tarry stools since yesterday.  He denies abdominal pain.  He takes baby aspirin.  No known history of cirrhosis or varices.  He denies similar episodes in the past.  He never had EGD or colonoscopy in the past.  He had alcohol yesterday.  His hemoglobin in the ER 7.9, MCV 113.2.  Last normal 13.9 in 05/2013.  Platelets 134, INR 1.29, BUN is disproportionately elevated to creatinine.  Patient denies abdominal pain, abdominal distention, swelling of legs.  In the ER, he was found to have new onset of A. fib with RVR, hypotensive, started on rapid transfusion protocol, received 2 units emergently and just received third unit.  His heart rate slightly improved, blood pressure is normal.  He is on pantoprazole and octreotide drips.  Troponin mildly elevated, lactic acid 5.58.  GI is consulted urgently due to active GI bleed and hypotension   NSAIDs: None  Antiplts/Anticoagulants/Anti thrombotics: None  GI Procedures: None  Past Medical History:  Diagnosis Date  . BPH (benign prostatic hyperplasia)   . GERD  (gastroesophageal reflux disease)   . Hypertension     History reviewed. No pertinent surgical history.  Prior to Admission medications   Medication Sig Start Date End Date Taking? Authorizing Provider  aspirin EC 81 MG tablet Take 81 mg by mouth daily.   Yes [provider]  famotidine (PEPCID) 20 MG tablet Take 20 mg by mouth 2 (two) times daily. 02/10/18  Yes [provider]  finasteride (PROSCAR) 5 MG tablet Take 5 mg by mouth daily.   Yes [provider]  metoprolol succinate (TOPROL-XL) 50 MG 24 hr tablet Take 100 mg by mouth daily. 02/13/18  Yes [provider]  silodosin (RAPAFLO) 8 MG CAPS capsule Take 8 mg by mouth daily. 01/26/18  Yes [provider]  Thiamine HCl (VITAMIN B-1) 250 MG tablet Take 250 mg by mouth daily.   Yes [provider]    Current Facility-Administered Medications:  .  cefTRIAXone (ROCEPHIN) 1 g in sodium chloride 0.9 % 100 mL IVPB, 1 g, Intravenous, Once, Alfred Levins, [place], MD .  octreotide (SANDOSTATIN) 2 mcg/mL load via infusion 50 mcg, 50 mcg, Intravenous, Once **AND** octreotide (SANDOSTATIN) 500 mcg in sodium chloride 0.9 % 250 mL (2 mcg/mL) infusion, 50 mcg/hr, Intravenous, Continuous, [place], [place], MD .  pantoprazole (PROTONIX) 80 mg in sodium chloride 0.9 % 100 mL IVPB, 80 mg, Intravenous, Once, Alfred Levins, [place], MD .  pantoprazole (PROTONIX) 80 mg in sodium chloride 0.9 %  250 mL (0.32 mg/mL) infusion, 8 mg/hr, Intravenous, Continuous, Alfred Levins, [place], MD .  Derrill Memo ON 02/19/2018] pantoprazole (PROTONIX) injection 40 mg, 40 mg, Intravenous, Q12H, Veronese, [place], MD .  thiamine (B-1) injection 100 mg, 100 mg, Intravenous, Once, Alfred Levins, [place], MD  Current Outpatient Medications:  .  aspirin EC 81 MG tablet, Take 81 mg by mouth daily., Disp: , Rfl:  .  famotidine (PEPCID) 20 MG tablet, Take 20 mg by mouth 2 (two) times daily., Disp: , Rfl: 3 .  finasteride (PROSCAR) 5 MG tablet,  Take 5 mg by mouth daily., Disp: , Rfl:  .  metoprolol succinate (TOPROL-XL) 50 MG 24 hr tablet, Take 100 mg by mouth daily., Disp: , Rfl:  .  silodosin (RAPAFLO) 8 MG CAPS capsule, Take 8 mg by mouth daily., Disp: , Rfl: 11 .  Thiamine HCl (VITAMIN B-1) 250 MG tablet, Take 250 mg by mouth daily., Disp: , Rfl:   Facility-Administered Medications Ordered in Other Encounters:  .  lactated ringers infusion, , , Continuous PRN, Hedda Slade, CRNA  Family History  Problem Relation Age of Onset  . Heart disease Father   . Aneurysm Sister      Social History   Tobacco Use  . Smoking status: Never Smoker  . Smokeless tobacco: Never Used  Substance Use Topics  . Alcohol use: Yes  . Drug use: Not on file    Allergies as of 02/15/2018  . (No Known Allergies)    Review of Systems:    All systems reviewed and negative except where noted in HPI.   Physical Exam:  Vital signs in last 24 hours: Temp:  [98.2 F (36.8 C)-98.5 F (36.9 C)] 98.3 F (36.8 C) (12/09 1800) Pulse Rate:  [93-200] 127 (12/09 1830) Resp:  [14-25] 14 (12/09 1830) BP: (73-124)/(47-100) 124/83 (12/09 1830) SpO2:  [95 %-99 %] 99 % (12/09 1830) Weight:  [64.9 kg] 64.9 kg (12/09 1642)   General: In mild distress, cooperative, shivering Head:  Normocephalic and atraumatic. Eyes:   No icterus.   Conjunctiva pale. PERRLA. Ears:  Normal auditory acuity. Neck:  Supple; no masses or thyroidomegaly Lungs: Respirations even and unlabored. Lungs clear to auscultation bilaterally.   No wheezes, crackles, or rhonchi.  Heart: Tachycardia;  Without murmur, clicks, rubs or gallops Abdomen:  Soft, nondistended, nontender. Normal bowel sounds. No appreciable masses or hepatomegaly.  No rebound or guarding.  Rectal:  Not performed. Msk:  Symmetrical without gross deformities.   Extremities:  Without edema, cyanosis or clubbing. Neurologic:  Alert and oriented x3;  grossly normal neurologically. Skin:  Intact without significant  lesions or rashes. Psych:  Alert and cooperative. Normal affect.  LAB RESULTS: CBC Latest Ref Rng & Units 02/15/2018 06/02/2013 07/21/2009  WBC 4.0 - 10.5 K/uL 8.7 4.3 5.9  Hemoglobin 13.0 - 17.0 g/dL 7.9(L) 13.9 11.8(L)  Hematocrit 39.0 - 52.0 % 24.0(L) 42 34.6(L)  Platelets 150 - 400 K/uL 134(L) 180 161    BMET BMP Latest Ref Rng & Units 02/15/2018 06/02/2013 07/21/2009  Glucose 70 - 99 mg/dL 172(H) - 111(H)  BUN 8 - 23 mg/dL 63(H) 24(A) 6  Creatinine 0.61 - 1.24 mg/dL 1.36(H) 1.1 0.87  Sodium 135 - 145 mmol/L 142 145 133(L)  Potassium 3.5 - 5.1 mmol/L 3.7 4.5 3.2(L)  Chloride 98 - 111 mmol/L 110 - 99  CO2 22 - 32 mmol/L 22 - 27  Calcium 8.9 - 10.3 mg/dL 9.0 - 8.4    LFT Hepatic Function Latest Ref Rng & Units  02/15/2018 06/02/2013 07/19/2009  Total Protein 6.5 - 8.1 g/dL 6.0(L) - 7.0  Albumin 3.5 - 5.0 g/dL 3.1(L) - 3.6  AST 15 - 41 U/L 54(H) 72(A) 37  ALT 0 - 44 U/L 32 58(A) 30  Alk Phosphatase 38 - 126 U/L 77 78 59  Total Bilirubin 0.3 - 1.2 mg/dL 1.6(H) - 1.1     STUDIES: No results found.    Impression / Plan:   Marvin Ortega is a 67 y.o. African-American male with heavy alcohol use presents with 2 days history of several episodes of melena resulting in hemorrhagic shock.  Differentials include peptic ulcer disease or variceal bleed or Dielafouy's.   Ultrasound in 2016 with no evidence of cirrhosis or portal hypertension  Acute hemorrhagic shock, melena: Continue pantoprazole and octreotide drips Ceftriaxone for SBP prophylaxis Monitor CBC closely and transfuse to maintain hemoglobin greater than 8 Recommend emergent upper endoscopy to evaluate the source of GI bleed as patient presented with hemorrhagic shock and ongoing melena I spoke to patient's sister.  His wife is not bedside.  Patient signed the consent.  He will be intubated for EGD  I have discussed alternative options, risks & benefits,  which include, but are not limited to, bleeding, infection,  perforation,respiratory complication & drug reaction.  The patient agrees with this plan & written consent will be obtained.   Critically ill  Thank you for involving me in the care of this patient.      LOS: 0 days   Sherri Sear, MD  02/15/2018, 7:11 PM   Note: This dictation was prepared with Dragon dictation along with smaller phrase technology. Any transcriptional errors that result from this process are unintentional.

## 2018-02-15 NOTE — Anesthesia Preprocedure Evaluation (Signed)
Anesthesia Evaluation  Patient identified by MRN, date of birth, ID band Patient awake    Reviewed: Allergy & Precautions, H&P , NPO status , Patient's Chart, lab work & pertinent test results, reviewed documented beta blocker date and time   History of Anesthesia Complications Negative for: history of anesthetic complications  Airway Mallampati: II  TM Distance: >3 FB Neck ROM: full    Dental  (+) Dental Advidsory Given, Missing, Poor Dentition   Pulmonary neg pulmonary ROS,    breath sounds clear to auscultation       Cardiovascular Exercise Tolerance: Good hypertension, (-) angina(-) Past MI, (-) Cardiac Stents and (-) CABG + dysrhythmias (in the ED was in Afib with RVR, now stable) Atrial Fibrillation (-) Valvular Problems/Murmurs Rhythm:regular Rate:Tachycardia  Troponins elevated to .13 currently, but denies chest pain.  This is likely from demand ischemia secondary to acute blood loss, but will need cardiology to workup further.   Neuro/Psych negative neurological ROS  negative psych ROS   GI/Hepatic Neg liver ROS, GERD  ,  Endo/Other  negative endocrine ROS  Renal/GU negative Renal ROS  negative genitourinary   Musculoskeletal   Abdominal   Peds  Hematology negative hematology ROS (+)   Anesthesia Other Findings Past Medical History: No date: BPH (benign prostatic hyperplasia) No date: GERD (gastroesophageal reflux disease) No date: Hypertension  Patient with a history of alcohol abuse, HTN, and GERD, presenting with acute GI bleed and atrial fibrillation with RVR.  Afib now under control and patient received 2 units of blood in the ED.  Now coming emergently for EGD to evaluate/treat the bleed.  Reproductive/Obstetrics negative OB ROS                             Anesthesia Physical Anesthesia Plan  ASA: IV and emergent  Anesthesia Plan: General   Post-op Pain Management:     Induction: Intravenous, Rapid sequence and Cricoid pressure planned  PONV Risk Score and Plan: 2 and Treatment may vary due to age or medical condition  Airway Management Planned: Oral ETT  Additional Equipment:   Intra-op Plan:   Post-operative Plan: Possible Post-op intubation/ventilation and Extubation in OR  Informed Consent: I have reviewed the patients History and Physical, chart, labs and discussed the procedure including the risks, benefits and alternatives for the proposed anesthesia with the patient or authorized representative who has indicated his/her understanding and acceptance.   Dental Advisory Given  Plan Discussed with: Anesthesiologist, CRNA and Surgeon  Anesthesia Plan Comments: (Discussed this patient with Dr. Allegra LaiVanga.  Patient came in with a fib with RVR and has an increased troponin of .13, which is concerning, but given the nature of the case we will emergently go to the endoscopy suite for EGD.  Discussed the risks of anesthesia with the patient including possible post operative intubation and ICU stay as well as loss of life.  Patient voiced understanding and agreed with proceeding.)        Anesthesia Quick Evaluation

## 2018-02-15 NOTE — Anesthesia Procedure Notes (Signed)
Procedure Name: Intubation Date/Time: 02/15/2018 7:20 PM Performed by: Hedda Slade, CRNA Pre-anesthesia Checklist: Patient identified, Patient being monitored, Timeout performed, Emergency Drugs available and Suction available Patient Re-evaluated:Patient Re-evaluated prior to induction Oxygen Delivery Method: Circle system utilized Preoxygenation: Pre-oxygenation with 100% oxygen Induction Type: IV induction and Cricoid Pressure applied Laryngoscope Size: Mac and 4 Grade View: Grade I Tube type: Oral Tube size: 7.0 mm Number of attempts: 1 Airway Equipment and Method: Stylet Placement Confirmation: ETT inserted through vocal cords under direct vision,  positive ETCO2 and breath sounds checked- equal and bilateral Secured at: 22 cm Tube secured with: Tape Dental Injury: Teeth and Oropharynx as per pre-operative assessment

## 2018-02-15 NOTE — H&P (Signed)
[facility] [place] [facility]   PATIENT NAME: Marvin Ortega    MR#:  [id number]  DATE OF BIRTH:  Aug 17, 1950  DATE OF ADMISSION:  02/15/2018  PRIMARY CARE PHYSICIAN: Kandyce RudBabaoff, [name], MD   REQUESTING/REFERRING PHYSICIAN:   CHIEF COMPLAINT:   Chief Complaint  Patient presents with  . Abnormal Lab    HISTORY OF PRESENT ILLNESS: Marvin Ortega  is a 67 y.o. male with a known history per below presenting to the emergency room with numerous black stools for over 24 hours, patient with complaints of lightheadedness, dizziness, generalized weakness, fatigue, in the emergency room patient was found to have heart rate in the 150s, hypotension, creatinine 1.3, lactic acid 5.5, hemoglobin 7.8, patient taken immediately to endoscopy by gastroenterology-had EGD done noted for esophagitis/Dieulafol lesion, patient placed on Protonix drip/octreotide drip per gastroenterology, sent to ICU, hospitalist asked for admission, patient is now been admitted for acute melena with associated shock.  PAST MEDICAL HISTORY:   Past Medical History:  Diagnosis Date  . BPH (benign prostatic hyperplasia)   . GERD (gastroesophageal reflux disease)   . Hypertension     PAST SURGICAL HISTORY: History reviewed. No pertinent surgical history.  SOCIAL HISTORY:  Social History   Tobacco Use  . Smoking status: Never Smoker  . Smokeless tobacco: Never Used  Substance Use Topics  . Alcohol use: Yes    FAMILY HISTORY:  Family History  Problem Relation Age of Onset  . Heart disease Father   . Aneurysm Sister     DRUG ALLERGIES: No Known Allergies  REVIEW OF SYSTEMS:   CONSTITUTIONAL: No fever, fatigue or weakness.  EYES: No blurred or double vision.  EARS, NOSE, AND THROAT: No tinnitus or ear pain.  RESPIRATORY: No cough, shortness of breath, wheezing or hemoptysis.  CARDIOVASCULAR: No chest pain, orthopnea, edema.  GASTROINTESTINAL: No nausea, vomiting, diarrhea or abdominal pain.   GENITOURINARY: No dysuria, hematuria.  ENDOCRINE: No polyuria, nocturia,  HEMATOLOGY: No anemia, easy bruising or bleeding SKIN: No rash or lesion. MUSCULOSKELETAL: No joint pain or arthritis.   NEUROLOGIC: No tingling, numbness, weakness.  PSYCHIATRY: No anxiety or depression.   MEDICATIONS AT HOME:  Prior to Admission medications   Medication Sig Start Date End Date Taking? Authorizing Provider  aspirin EC 81 MG tablet Take 81 mg by mouth daily.   Yes [provider]  famotidine (PEPCID) 20 MG tablet Take 20 mg by mouth 2 (two) times daily. 02/10/18  Yes [provider]  finasteride (PROSCAR) 5 MG tablet Take 5 mg by mouth daily.   Yes [provider]  metoprolol succinate (TOPROL-XL) 50 MG 24 hr tablet Take 100 mg by mouth daily. 02/13/18  Yes [provider]  silodosin (RAPAFLO) 8 MG CAPS capsule Take 8 mg by mouth daily. 01/26/18  Yes [provider]  Thiamine HCl (VITAMIN B-1) 250 MG tablet Take 250 mg by mouth daily.   Yes [provider]      PHYSICAL EXAMINATION:   VITAL SIGNS: Blood pressure 124/83, pulse (!) 127, temperature 98.3 F (36.8 C), temperature source Oral, resp. rate 14, height 5\' 6"  (1.676 m), weight 64.9 kg, SpO2 99 %.  GENERAL:  67 y.o.-year-old patient lying in the bed with no acute distress.  EYES: Pupils equal, round, reactive to light and accommodation. No scleral icterus. Extraocular muscles intact.  HEENT: Head atraumatic, normocephalic. Oropharynx and nasopharynx clear.  NECK:  Supple, no jugular venous distention. No thyroid enlargement, no tenderness.  LUNGS: Normal breath  sounds bilaterally, no wheezing, rales,rhonchi or crepitation. No use of accessory muscles of respiration.  CARDIOVASCULAR: S1, S2 normal. No murmurs, rubs, or gallops.  ABDOMEN: Soft, nontender, nondistended. Bowel sounds present. No organomegaly or mass.  EXTREMITIES: No pedal edema, cyanosis, or clubbing.  NEUROLOGIC: Cranial  nerves II through XII are intact. Muscle strength 5/5 in all extremities. Sensation intact. Gait not checked.  PSYCHIATRIC: The patient is alert and oriented x 3.  SKIN: No obvious rash, lesion, or ulcer.   LABORATORY PANEL:   CBC Recent Labs  Lab 02/15/18 1710  WBC 8.7  HGB 7.9*  HCT 24.0*  PLT 134*  MCV 113.2*  MCH 37.3*  MCHC 32.9  RDW 13.0  LYMPHSABS 1.2  MONOABS 0.8  EOSABS 0.2  BASOSABS 0.0   ------------------------------------------------------------------------------------------------------------------  Chemistries  Recent Labs  Lab 02/15/18 1710  NA 142  K 3.7  CL 110  CO2 22  GLUCOSE 172*  BUN 63*  CREATININE 1.36*  CALCIUM 9.0  AST 54*  ALT 32  ALKPHOS 77  BILITOT 1.6*   ------------------------------------------------------------------------------------------------------------------ estimated creatinine clearance is 46.3 mL/min (A) (by C-G formula based on SCr of 1.36 mg/dL (H)). ------------------------------------------------------------------------------------------------------------------ No results for input(s): TSH, T4TOTAL, T3FREE, THYROIDAB in the last 72 hours.  Invalid input(s): FREET3   Coagulation profile Recent Labs  Lab 02/15/18 1710  INR 1.29   ------------------------------------------------------------------------------------------------------------------- No results for input(s): DDIMER in the last 72 hours. -------------------------------------------------------------------------------------------------------------------  Cardiac Enzymes Recent Labs  Lab 02/15/18 1710  TROPONINI 0.13*   ------------------------------------------------------------------------------------------------------------------ Invalid input(s): POCBNP  ---------------------------------------------------------------------------------------------------------------  Urinalysis No results found for: COLORURINE, APPEARANCEUR, LABSPEC, PHURINE,  GLUCOSEU, HGBUR, BILIRUBINUR, KETONESUR, PROTEINUR, UROBILINOGEN, NITRITE, LEUKOCYTESUR   RADIOLOGY: No results found.  EKG: Orders placed or performed during the hospital encounter of 02/15/18  . ED EKG  . ED EKG    IMPRESSION AND PLAN: *Acute melena with acute shock Status post emergent EGD noted for esophagitis/Dieulafol lesion by gastroenterology Admit to ICU, Protonix drip, octreotide drip, IV fluids for rehydration, gastroenterology consulted for expert opinion, H&H every 6 hours, CBC daily, transfuse as needed, and continue close medical monitoring  *Acute tachyarrhythmia Most likely secondary to above Repeat EKG, rule out acute coronary syndrome with cardiac enzymes x2 sets, continue to monitor hemoglobin as stated above and transfuse as needed  *Chronic alcohol abuse Alcohol withdrawal protocol while in house  *Chronic benign essential hypertension Antihypertensives on hold given hypotension from acute GI bleeding  *History of GERD PPI per above  *Chronic BPH Continue Flomax   All the records are reviewed and case discussed with ED provider. Management plans discussed with the patient, family and they are in agreement.  CODE STATUS:full    Code Status Orders  (From admission, onward)         Start     Ordered   02/15/18 2100  Full code  Continuous     02/15/18 2059        Code Status History    This patient has a current code status but no historical code status.     TOTAL TIME TAKING CARE OF THIS PATIENT: 40 minutes.    Evelena Asa Dallyn Bergland M.D on 02/15/2018   Between 7am to 6pm - Pager - [phone]  After 6pm go to [email or web address] - password [email or web address]  [facility]  Office  [phone]  CC: Primary care physician; Kandyce Rud, MD   Note: This dictation was prepared with Dragon dictation along with smaller phrase technology. Any transcriptional errors that result from this  process are unintentional.

## 2018-02-15 NOTE — Anesthesia Post-op Follow-up Note (Signed)
Anesthesia QCDR form completed.        

## 2018-02-15 NOTE — Consult Note (Signed)
Name: Marvin Ortega MRN: [id number] DOB: Mar 13, 1950    ADMISSION DATE:  02/15/2018 CONSULTATION DATE: 02/15/2018  REFERRING MD : Dr. Allegra Lai   CHIEF COMPLAINT:  Altered Mental Status and GI Bleed   BRIEF PATIENT DESCRIPTION:  67 yo male with PMH of ETOH abuse admitted with GI bleed s/p EGD revealing dieulafoy lesion of stomach which has been injected with epinephrine and clipped with good hemostasis  SIGNIFICANT EVENTS/STUDIES   12/9-Upper endoscopy revealed The duodenal bulb and second portion of the duodenum were normal. Hematin (altered blood/coffee-ground-like material) was found in the entire examined stomach. Lavage with suction was performed using 50 -200 mL of sterile water, resulting in clearance with adequate visualization. A Dieulafoy lesion with spurting bleeding and was found in the cardia in  the hiatal hernia. Area was injected with 2 mL of a 1:10,000 solution of  epinephrine for hemostasis, bleeding slowed. For hemostasis, three  hemostatic clips were successfully placed (MR conditional). There was no bleeding at the end of the procedure. A small hiatal hernia was present. LA Grade D (one or more mucosal breaks involving at least 75% of esophageal circumference) esophagitis with no bleeding was found in the lower third of the esophagus. 12/9-Pt admitted to ICU post EGD   HISTORY OF PRESENT ILLNESS:   This is a 67 yo male admitted with a PMH of HTN, GERD, Benign Prostatic Hyperplasia, and heavy ETOH Abuse (drinks a gallon of vodka daily).  He presented to [facility] ER on 12/9 per his PCP recommendations with GI Bleed.  Per ER notes the pt had 20 episodes of black tarry stools since 12/8, and became dizzy feeling as if he was going to pass out prompting PCP office visit.  During PCP office visit pt tachycardic hr 130's and hgb 8, therefore pt instructed to proceed to the ER.  He does have a hx of heavy ETOH abuse and drinks a gallon of vodka daily his most recent alcoholic beverage was  the evening of 12/8.  In the ER he received 2 units pRBC's of emergent blood and 1L NS bolus hgb 7.91 at that time baseline hgb 14.  He had 1 episode of coffee ground emesis about 400 ml while in the ER.  Initial EKG revealed atrial fibrillation with rvr hr 189, diffuse ST depression without ST elevation following administration of blood pt stabilized cardiac rhythm sinus tachycardia hr 117 bpm. Octreotide and protonix gtts initiated, and he received ceftriaxone due to concern of variceal bleed.  Gastroenterologist Dr. Allegra Lai consulted pt underwent emergent upper endoscopy, which revealed dieulafoy lesion of stomach which has been injected with epinephrine and clipped with good hemostasis.  He was subsequently admitted to ICU post procedure for additional workup and treatment.   PAST MEDICAL HISTORY :   has a past medical history of BPH (benign prostatic hyperplasia), GERD (gastroesophageal reflux disease), and Hypertension.  has no past surgical history on file. Prior to Admission medications   Medication Sig Start Date End Date Taking? Authorizing Provider  aspirin EC 81 MG tablet Take 81 mg by mouth daily.   Yes [provider]  famotidine (PEPCID) 20 MG tablet Take 20 mg by mouth 2 (two) times daily. 02/10/18  Yes [provider]  finasteride (PROSCAR) 5 MG tablet Take 5 mg by mouth daily.   Yes [provider]  metoprolol succinate (TOPROL-XL) 50 MG 24 hr tablet Take 100 mg by mouth daily. 02/13/18  Yes [provider]  silodosin (RAPAFLO) 8 MG CAPS capsule  Take 8 mg by mouth daily. 01/26/18  Yes [provider]  Thiamine HCl (VITAMIN B-1) 250 MG tablet Take 250 mg by mouth daily.   Yes [provider]   No Known Allergies  FAMILY HISTORY:  family history includes Aneurysm in his sister; Heart disease in his father. SOCIAL HISTORY:  reports that he has never smoked. He has never used smokeless tobacco. He reports that he drinks  alcohol.  REVIEW OF SYSTEMS:   Unable to assess pt remains confused   SUBJECTIVE:  Unable to assess pt remains confused   VITAL SIGNS: Temp:  [98.2 F (36.8 C)-98.5 F (36.9 C)] 98.3 F (36.8 C) (12/09 1800) Pulse Rate:  [93-200] 127 (12/09 1830) Resp:  [14-25] 14 (12/09 1830) BP: (73-124)/(47-100) 124/83 (12/09 1830) SpO2:  [95 %-99 %] 99 % (12/09 1830) Weight:  [64.9 kg] 64.9 kg (12/09 1642)  PHYSICAL EXAMINATION: General: well developed male resting in bed, NAD  Neuro: confused, follows commands, PERRL  HEENT: supple, no JVD  Cardiovascular: nsr, rrr, no R/G  Lungs: clear throughout, even, non labored  Abdomen: +BS x4, soft, non tender, non distended  Musculoskeletal: normal bulk and tone, no edema  Skin: intact no rashes or lesions present   Recent Labs  Lab 02/15/18 1710  NA 142  K 3.7  CL 110  CO2 22  BUN 63*  CREATININE 1.36*  GLUCOSE 172*   Recent Labs  Lab 02/15/18 1710  HGB 7.9*  HCT 24.0*  WBC 8.7  PLT 134*   No results found.  ASSESSMENT / PLAN:  Acute hemorrhagic shock secondary to GI Bleed s/p EGD Thrombocytopenia secondary to ETOH abuse  Continuous telemetry monitoring  NS @100  ml/hr  GI consulted appreciate input Serial H&H q6hrs Monitor for s/sx of bleeding  Maintain hgb >8 Continue protonix and octreotide gtts for now  Maintain map >65 with fluid resuscitation  Ceftriaxone for SBP prophylaxis, will check PCT and obtain cultures  US RUQ Limited pending   Elevated troponin likely demand ischemia in setting of GI Bleed New onset atrial fibrillation with rvr-resolved  Continuous telemetry monitoring  Trend troponin's   Acute renal failure likely secondary to hypotension  Lactic acidosis-improving  Trend BMP and lactic acid  Replace electrolytes as indicated  Monitor UOP Avoid nephrotoxic medications   Acute encephalopathy  ETOH abuse Continue CIWA protocol   Sonda Rumbleana Kayci [place], AGNP  Pulmonary/Critical Care Pager [phone]  (please enter 7 digits) PCCM Consult Pager [phone] (please enter 7 digits)

## 2018-02-16 ENCOUNTER — Inpatient Hospital Stay: Payer: Medicare HMO

## 2018-02-16 ENCOUNTER — Encounter: Payer: Self-pay | Admitting: Gastroenterology

## 2018-02-16 DIAGNOSIS — K3182 Dieulafoy lesion (hemorrhagic) of stomach and duodenum: Principal | ICD-10-CM

## 2018-02-16 LAB — LACTIC ACID, PLASMA: Lactic Acid, Venous: 1.5 mmol/L (ref 0.5–1.9)

## 2018-02-16 LAB — CBC
HCT: 32.6 % — ABNORMAL LOW (ref 39.0–52.0)
Hemoglobin: 11 g/dL — ABNORMAL LOW (ref 13.0–17.0)
MCH: 31.6 pg (ref 26.0–34.0)
MCHC: 33.7 g/dL (ref 30.0–36.0)
MCV: 93.7 fL (ref 80.0–100.0)
Platelets: 72 10*3/uL — ABNORMAL LOW (ref 150–400)
RBC: 3.48 MIL/uL — ABNORMAL LOW (ref 4.22–5.81)
RDW: 21.2 % — ABNORMAL HIGH (ref 11.5–15.5)
WBC: 8.4 10*3/uL (ref 4.0–10.5)
nRBC: 0 % (ref 0.0–0.2)

## 2018-02-16 LAB — TROPONIN I
Troponin I: 0.1 ng/mL (ref <0.03)
Troponin I: 0.07 ng/mL (ref <0.03)

## 2018-02-16 LAB — COMPREHENSIVE METABOLIC PANEL
ALT: 26 U/L (ref 0–44)
AST: 39 U/L (ref 15–41)
Albumin: 2.6 g/dL — ABNORMAL LOW (ref 3.5–5.0)
Alkaline Phosphatase: 61 U/L (ref 38–126)
Anion gap: 4 — ABNORMAL LOW (ref 5–15)
BUN: 52 mg/dL — ABNORMAL HIGH (ref 8–23)
CO2: 23 mmol/L (ref 22–32)
Calcium: 7.8 mg/dL — ABNORMAL LOW (ref 8.9–10.3)
Chloride: 117 mmol/L — ABNORMAL HIGH (ref 98–111)
Creatinine, Ser: 1.16 mg/dL (ref 0.61–1.24)
GFR calc Af Amer: >60 mL/min (ref >60)
GFR calc non Af Amer: >60 mL/min (ref >60)
Glucose, Bld: 174 mg/dL — ABNORMAL HIGH (ref 70–99)
Potassium: 4.3 mmol/L (ref 3.5–5.1)
Sodium: 144 mmol/L (ref 135–145)
Total Bilirubin: 1.6 mg/dL — ABNORMAL HIGH (ref 0.3–1.2)
Total Protein: 5 g/dL — ABNORMAL LOW (ref 6.5–8.1)

## 2018-02-16 LAB — HEMOGLOBIN AND HEMATOCRIT, BLOOD
HCT: 34.6 % — ABNORMAL LOW (ref 39.0–52.0)
HEMOGLOBIN: 11.5 g/dL — AB (ref 13.0–17.0)

## 2018-02-16 LAB — ETHANOL: Alcohol, Ethyl (B): <10 mg/dL (ref <10)

## 2018-02-16 LAB — GLUCOSE, CAPILLARY: Glucose-Capillary: 105 mg/dL — ABNORMAL HIGH (ref 70–99)

## 2018-02-16 LAB — PROCALCITONIN: PROCALCITONIN: 0.19 ng/mL

## 2018-02-16 MED ORDER — PNEUMOCOCCAL VAC POLYVALENT 25 MCG/0.5ML IJ INJ
0.5000 mL | INJECTION | INTRAMUSCULAR | Status: AC
  Administered 2018-02-18: 11:00:00 0.5 mL via INTRAMUSCULAR
  Filled 2018-02-16: qty 0.5

## 2018-02-16 MED ORDER — LORAZEPAM 2 MG/ML IJ SOLN: 2.0000 mg | INTRAMUSCULAR | Status: DC | PRN

## 2018-02-16 NOTE — Progress Notes (Signed)
   Subjective:    Patient ID: Marvin Ortega, male    DOB: November 06, 1950, 67 y.o.   MRN: [id number]  HPI 67 yo male with PMH of ETOH abuse admitted with GI bleed s/p EGD revealing Dieulafoy lesion of stomachwhich has been injected with epinephrine and clipped with good hemostasis.  No further episodes of bleeding overnight.  Has been on [place] scale for alcohol withdrawal.  He is on octreotide infusion and pantoprazole infusion. Review of Systems  Unable to perform ROS: Mental status change       Objective:   Physical Exam Vital signs and nursing notes reviewed. General: Well-developed, well-nourished male in no respiratory distress. HEENT: Oral mucosa moist.  Edentulous. Neck: Supple, trachea midline, no JVD. CV: Regular rate and rhythm, no rubs murmurs or gallops heard. Lungs: Clear throughout, no rhonchi or wheezes noted. Abdomen: Nondistended, soft, normoactive bowel sounds.  Cannot elicit tenderness. Musculoskeletal: No deformities.  No cyanosis clubbing or edema noted.  Pulses are intact. Skin: No rashes or petechiae noted. Neurologic: Sleepy, confused when engaged.  Follows simple commands.  Sickly nonverbal at present.     Assessment & Plan:  Acute hemorrhagic shock secondary to GI Bleed s/p EGD Thrombocytopenia secondary to ETOH abuse  Continuous telemetry monitoring  NS @100  ml/hr  GI following patient Serial H&H q6hrs  Monitor for s/sx of bleeding  Maintain hgb >8 Continue protonix and octreotide gtts for now, GI to determine when to DC. Maintain map >65 with fluid resuscitation  Ceftriaxone for SBP prophylaxis, calcitonin was less than 0.5 and cultures negative so far US RUQ not show ascites.  He has gallstones but cannot support use of antibiotics so therefore will DC.  Elevated troponin likely demand ischemia in setting of GI Bleed New onset atrial fibrillation with rvr-resolved  Continuous telemetry monitoring  Trend troponin's, these are trending down with  supportive care so favor demand ischemia.  Acute renal failure likely secondary to hypotension  Lactic acidosis-improving  Trend BMP and lactic acid, lactic acid has normalized now being 1.5. Replace electrolytes as indicated  Monitor UOP Avoid nephrotoxic medications   Acute encephalopathy  ETOH abuse Continue CIWA protocol    Patient's wife at bedside, apprised.  She was discussed at multidisciplinary rounds with Pharm.D., RD, RT, case management, nutrition and bedside nurse.   Blima Rich. Laura Macai Sisneros [place] PCCM

## 2018-02-16 NOTE — Progress Notes (Signed)
[facility] - [place] at [facility]   PATIENT NAME: Marvin Ortega    MR#:  [id number]  DATE OF BIRTH:  04-24-1950  SUBJECTIVE:   Doing well this morning.  Has no concerns.  Denies any chest pain or palpitations.  REVIEW OF SYSTEMS:  Review of Systems  Constitutional: Positive for malaise/fatigue. Negative for chills and fever.  HENT: Negative for congestion and sore throat.   Eyes: Negative for blurred vision and double vision.  Respiratory: Negative for cough and shortness of breath.   Cardiovascular: Negative for chest pain, palpitations and leg swelling.  Gastrointestinal: Negative for abdominal pain, nausea and vomiting.  Genitourinary: Negative for dysuria and urgency.  Musculoskeletal: Negative for back pain and neck pain.  Neurological: Negative for dizziness and headaches.  Psychiatric/Behavioral: Negative for depression. The patient is not nervous/anxious.     DRUG ALLERGIES:  No Known Allergies VITALS:  Blood pressure 122/77, pulse 81, temperature 98.5 F (36.9 C), temperature source Oral, resp. rate 15, height 5\' 6"  (1.676 m), weight 64.9 kg, SpO2 95 %. PHYSICAL EXAMINATION:  Physical Exam  GENERAL:  67 y.o.-year-old patient lying in the bed with no acute distress.  EYES: Pupils equal, round, reactive to light and accommodation. No scleral icterus. Extraocular muscles intact.  HEENT: Head atraumatic, normocephalic. Oropharynx and nasopharynx clear.  Moist mucous membranes. NECK:  Supple, no jugular venous distention. No thyroid enlargement, no tenderness.  LUNGS: Normal breath sounds bilaterally, no wheezing, rales,rhonchi or crepitation. No use of accessory muscles of respiration.  CARDIOVASCULAR: RRR, S1, S2 normal. No murmurs, rubs, or gallops.  ABDOMEN: Soft, nontender, nondistended. Bowel sounds present. No organomegaly or mass.  EXTREMITIES: No pedal edema, cyanosis, or clubbing.  NEUROLOGIC: Cranial nerves II through XII are intact. +  Global weakness. Sensation intact. Gait not checked.  PSYCHIATRIC: The patient is alert and oriented x 3.  SKIN: No obvious rash, lesion, or ulcer.  LABORATORY PANEL:  Male CBC Recent Labs  Lab 02/16/18 0327  WBC 8.4  HGB 11.0*  HCT 32.6*  PLT 72*   ------------------------------------------------------------------------------------------------------------------ Chemistries  Recent Labs  Lab 02/16/18 0328  NA 144  K 4.3  CL 117*  CO2 23  GLUCOSE 174*  BUN 52*  CREATININE 1.16  CALCIUM 7.8*  AST 39  ALT 26  ALKPHOS 61  BILITOT 1.6*   RADIOLOGY:  [place] Abdomen Limited Ruq  Result Date: 02/16/2018 CLINICAL DATA:  ETOH abuse. EXAM: ULTRASOUND ABDOMEN LIMITED RIGHT UPPER QUADRANT COMPARISON:  12/21/2014. FINDINGS: Gallbladder: Multiple gallstones are again noted. Questionable small amount pericholecystic fluid. Negative Murphy sign. Common bile duct: Diameter: 3.9 mm Liver: Heterogeneous/echogenic hepatic parenchyma consistent with fatty infiltration and/or hepatocellular disease. Limited evaluation of the left hepatic lobe due to overlying bowel gas and patient respiration. No focal hepatic abnormality identified. Portal vein is patent on color Doppler imaging with normal direction of blood flow towards the liver. IMPRESSION: 1. Multiple gallstones again noted. Questionable small amount of pericholecystic fluid. Cholecystitis cannot be completely excluded. Negative Murphy sign. No biliary distention. 2. Heterogeneous/echogenic hepatic parenchymal echotexture consistent with fatty infiltration and/or hepatocellular disease. No focal hepatic abnormality identified. Exam noted by bowel gas and respiratory motion. Electronically Signed   By: Maisie Fushomas  Register   On: 02/16/2018 09:35   ASSESSMENT AND PLAN:   Acute melena with acute hemorrhagic shock- resolved after emergent EGD that showed a Dieulafoy lesion -Continue Protonix and octreotide drips -Continue IV fluids -GI  following -Monitor H&H closely -Transfuse as needed  Elevated troponin-likely due to  demand ischemia.  Troponins peaked at 0.13 and then down trended.  No active chest pain. -Monitor  Chronic alcohol abuse -CIWA -Thiamine, folate, multivitamin  Hypertension- BPs have improved -Continue holding home antihypertensives  Acute renal failure-improving, likely due to shock -Continue IV fluids -Monitor creatinine -Avoid nephrotoxic agents  History of GERD -On Protonix drip  Chronic BPH- stable -Continue Flomax  All the records are reviewed and case discussed with Care Management/Social Worker. Management plans discussed with the patient, family and they are in agreement.  CODE STATUS: Full Code  TOTAL TIME TAKING CARE OF THIS PATIENT: 40 minutes.   More than 50% of the time was spent in counseling/coordination of care: YES  POSSIBLE D/C IN 2-3 DAYS, DEPENDING ON CLINICAL CONDITION.   Jinny Blossom Latha Staunton M.D on 02/16/2018 at 5:16 PM  Between 7am to 6pm - Pager - [phone]  After 6pm go to [email or web address] - Social research officer, government  [facility] [place] [facility]  Office  [phone]  CC: Primary care physician; Kandyce Rud, MD  Note: This dictation was prepared with Dragon dictation along with smaller phrase technology. Any transcriptional errors that result from this process are unintentional.

## 2018-02-16 NOTE — Plan of Care (Signed)
?  Problem: Education: ?Goal: Knowledge of General Education information will improve ?Description: Including pain rating scale, medication(s)/side effects and non-pharmacologic comfort measures ?Outcome: Progressing ?  ?Problem: Health Behavior/Discharge Planning: ?Goal: Ability to manage health-related needs will improve ?Outcome: Progressing ?  ?Problem: Clinical Measurements: ?Goal: Ability to maintain clinical measurements within normal limits will improve ?Outcome: Progressing ?  ?Problem: Clinical Measurements: ?Goal: Will remain free from infection ?Outcome: Progressing ?  ?Problem: Clinical Measurements: ?Goal: Diagnostic test results will improve ?Outcome: Progressing ?  ?Problem: Clinical Measurements: ?Goal: Respiratory complications will improve ?Outcome: Progressing ?  ?Problem: Clinical Measurements: ?Goal: Cardiovascular complication will be avoided ?Outcome: Progressing ?  ?Problem: Activity: ?Goal: Risk for activity intolerance will decrease ?Outcome: Progressing ?  ?Problem: Nutrition: ?Goal: Adequate nutrition will be maintained ?Outcome: Progressing ?  ?Problem: Coping: ?Goal: Level of anxiety will decrease ?Outcome: Progressing ?  ?Problem: Elimination: ?Goal: Will not experience complications related to bowel motility ?Outcome: Progressing ?Goal: Will not experience complications related to urinary retention ?Outcome: Progressing ?  ?Problem: Pain Managment: ?Goal: General experience of comfort will improve ?Outcome: Progressing ?  ?Problem: Safety: ?Goal: Ability to remain free from injury will improve ?Outcome: Progressing ?  ?Problem: Skin Integrity: ?Goal: Risk for impaired skin integrity will decrease ?Outcome: Progressing ?  ?

## 2018-02-17 ENCOUNTER — Other Ambulatory Visit: Payer: Self-pay

## 2018-02-17 LAB — CBC WITH DIFFERENTIAL/PLATELET
Abs Immature Granulocytes: 0.04 10*3/uL (ref 0.00–0.07)
Basophils Absolute: 0 10*3/uL (ref 0.0–0.1)
Basophils Relative: 0 %
EOS ABS: 0 10*3/uL (ref 0.0–0.5)
Eosinophils Relative: 0 %
HCT: 30 % — ABNORMAL LOW (ref 39.0–52.0)
HEMOGLOBIN: 9.9 g/dL — AB (ref 13.0–17.0)
Immature Granulocytes: 1 %
Lymphocytes Relative: 17 %
Lymphs Abs: 1.3 10*3/uL (ref 0.7–4.0)
MCH: 31.4 pg (ref 26.0–34.0)
MCHC: 33 g/dL (ref 30.0–36.0)
MCV: 95.2 fL (ref 80.0–100.0)
Monocytes Absolute: 0.6 10*3/uL (ref 0.1–1.0)
Monocytes Relative: 8 %
Neutro Abs: 5.5 10*3/uL (ref 1.7–7.7)
Neutrophils Relative %: 74 %
Platelets: 69 10*3/uL — ABNORMAL LOW (ref 150–400)
RBC: 3.15 MIL/uL — ABNORMAL LOW (ref 4.22–5.81)
RDW: 20.7 % — ABNORMAL HIGH (ref 11.5–15.5)
WBC: 7.4 10*3/uL (ref 4.0–10.5)
nRBC: 0 % (ref 0.0–0.2)

## 2018-02-17 LAB — PROCALCITONIN: Procalcitonin: 0.1 ng/mL

## 2018-02-17 LAB — BASIC METABOLIC PANEL
Anion gap: 4 — ABNORMAL LOW (ref 5–15)
BUN: 36 mg/dL — ABNORMAL HIGH (ref 8–23)
CO2: 21 mmol/L — ABNORMAL LOW (ref 22–32)
Calcium: 7.1 mg/dL — ABNORMAL LOW (ref 8.9–10.3)
Chloride: 116 mmol/L — ABNORMAL HIGH (ref 98–111)
Creatinine, Ser: 1.2 mg/dL (ref 0.61–1.24)
GFR calc Af Amer: >60 mL/min (ref >60)
GFR calc non Af Amer: >60 mL/min (ref >60)
Glucose, Bld: 121 mg/dL — ABNORMAL HIGH (ref 70–99)
Potassium: 3.4 mmol/L — ABNORMAL LOW (ref 3.5–5.1)
Sodium: 141 mmol/L (ref 135–145)

## 2018-02-17 LAB — HIV ANTIBODY (ROUTINE TESTING W REFLEX): HIV Screen 4th Generation wRfx: NONREACTIVE

## 2018-02-17 MED ORDER — IPRATROPIUM BROMIDE 0.02 % IN SOLN: 0.5000 mg | RESPIRATORY_TRACT | Status: DC | PRN

## 2018-02-17 MED ORDER — POTASSIUM CHLORIDE CRYS ER 20 MEQ PO TBCR
20.0000 meq | EXTENDED_RELEASE_TABLET | Freq: Once | ORAL | Status: AC
  Administered 2018-02-17: 20 meq via ORAL
  Filled 2018-02-17: qty 1

## 2018-02-17 NOTE — Progress Notes (Signed)
Called significant other to notify of transfer

## 2018-02-17 NOTE — Progress Notes (Signed)
[facility] - [place] at [facility]   PATIENT NAME: Zebbie Ace    MR#:  [id number]  DATE OF BIRTH:  11-07-1950  SUBJECTIVE:   Continues to do well.  No additional bleeding episodes.  Endorses some generalized fatigue.  REVIEW OF SYSTEMS:  Review of Systems  Constitutional: Positive for malaise/fatigue. Negative for chills and fever.  HENT: Negative for congestion and sore throat.   Eyes: Negative for blurred vision and double vision.  Respiratory: Negative for cough and shortness of breath.   Cardiovascular: Negative for chest pain, palpitations and leg swelling.  Gastrointestinal: Negative for abdominal pain, nausea and vomiting.  Genitourinary: Negative for dysuria and urgency.  Musculoskeletal: Negative for back pain and neck pain.  Neurological: Negative for dizziness and headaches.  Psychiatric/Behavioral: Negative for depression. The patient is not nervous/anxious.     DRUG ALLERGIES:  No Known Allergies VITALS:  Blood pressure 129/73, pulse 72, temperature 99 F (37.2 C), temperature source Oral, resp. rate (!) 24, height 5\' 6"  (1.676 m), weight 65.8 kg, SpO2 95 %. PHYSICAL EXAMINATION:  Physical Exam  GENERAL:  67 y.o.-year-old patient lying in the bed with no acute distress.  EYES: Pupils equal, round, reactive to light and accommodation. No scleral icterus. Extraocular muscles intact.  HEENT: Head atraumatic, normocephalic. Oropharynx and nasopharynx clear.  Moist mucous membranes. NECK:  Supple, no jugular venous distention. No thyroid enlargement, no tenderness.  LUNGS: Normal breath sounds bilaterally, no wheezing, rales,rhonchi or crepitation. No use of accessory muscles of respiration.  CARDIOVASCULAR: RRR, S1, S2 normal. No murmurs, rubs, or gallops.  ABDOMEN: Soft, nontender, nondistended. Bowel sounds present. No organomegaly or mass.  EXTREMITIES: No pedal edema, cyanosis, or clubbing.  NEUROLOGIC: Cranial nerves II through XII are  intact. + Global weakness. Sensation intact. Gait not checked.  PSYCHIATRIC: The patient is alert and oriented x 3.  SKIN: No obvious rash, lesion, or ulcer.  LABORATORY PANEL:  Male CBC Recent Labs  Lab 02/17/18 0543  WBC 7.4  HGB 9.9*  HCT 30.0*  PLT 69*   ------------------------------------------------------------------------------------------------------------------ Chemistries  Recent Labs  Lab 02/16/18 0328 02/17/18 0543  NA 144 141  K 4.3 3.4*  CL 117* 116*  CO2 23 21*  GLUCOSE 174* 121*  BUN 52* 36*  CREATININE 1.16 1.20  CALCIUM 7.8* 7.1*  AST 39  --   ALT 26  --   ALKPHOS 61  --   BILITOT 1.6*  --    RADIOLOGY:  No results found. ASSESSMENT AND PLAN:   Acute melena with acute hemorrhagic shock- resolved after emergent EGD that showed a Dieulafoy lesion.  Hemoglobin dropped from 11.5>9.9 today. -Continue Protonix and octreotide drips -Continue IV fluids -GI following -Monitor H&H closely -Transfuse as needed -On clear liquid diet, will advance as tolerated  Elevated troponin-likely due to demand ischemia.  Troponins peaked at 0.13 and then down trended.  No active chest pain. -Monitor  Chronic alcohol abuse -CIWA -Thiamine, folate, multivitamin  Hypertension- BPs have improved -Continue holding home antihypertensives  Acute renal failure-improved, likely due to shock -Continue IV fluids -Monitor creatinine -Avoid nephrotoxic agents  History of GERD -On Protonix drip  Chronic BPH- stable -Continue Flomax  All the records are reviewed and case discussed with Care Management/Social Worker. Management plans discussed with the patient, family and they are in agreement.  CODE STATUS: Full Code  TOTAL TIME TAKING CARE OF THIS PATIENT: 42 minutes.   More than 50% of the time was spent in counseling/coordination of care:  YES  POSSIBLE D/C IN 2-3 DAYS, DEPENDING ON CLINICAL CONDITION.   Jinny BlossomKaty D Duane Trias M.D on 02/17/2018 at 4:25  PM  Between 7am to 6pm - Pager - [phone]  After 6pm go to [email or web address] - Social research officer, governmentpassword EPAS [facility]  [facility] [place] [facility]  Office  [phone]  CC: Primary care physician; Kandyce RudBabaoff, [name], MD  Note: This dictation was prepared with Dragon dictation along with smaller phrase technology. Any transcriptional errors that result from this process are unintentional.

## 2018-02-17 NOTE — Treatment Plan (Addendum)
Patient has been stable without any further episodes of bleeding.  DTs well controlled.  He is awake and alert.  Hemodynamically stable.  Transfer to floor with telemetry.  It is currently floor status.  PCCM will sign off.  Gailen Shelter. Laura Gonzalez, MD [place] PCCM

## 2018-02-18 LAB — TYPE AND SCREEN
ABO/RH(D): B POS
Antibody Screen: NEGATIVE
UNIT DIVISION: 0
UNIT DIVISION: 0
Unit division: 0
Unit division: 0

## 2018-02-18 LAB — BPAM RBC
BLOOD PRODUCT EXPIRATION DATE: 202001102359
BLOOD PRODUCT EXPIRATION DATE: 202001102359
BLOOD PRODUCT EXPIRATION DATE: 202001102359
Blood Product Expiration Date: 202001102359
ISSUE DATE / TIME: 201912091711
ISSUE DATE / TIME: 201912091755
ISSUE DATE / TIME: 201912091755
ISSUE DATE / TIME: 201912091711
Unit Type and Rh: 5100
Unit Type and Rh: 5100
Unit Type and Rh: 5100
Unit Type and Rh: 5100

## 2018-02-18 LAB — CBC
HEMATOCRIT: 31 % — AB (ref 39.0–52.0)
Hemoglobin: 10.4 g/dL — ABNORMAL LOW (ref 13.0–17.0)
MCH: 31.7 pg (ref 26.0–34.0)
MCHC: 33.5 g/dL (ref 30.0–36.0)
MCV: 94.5 fL (ref 80.0–100.0)
Platelets: 70 10*3/uL — ABNORMAL LOW (ref 150–400)
RBC: 3.28 MIL/uL — ABNORMAL LOW (ref 4.22–5.81)
RDW: 19 % — ABNORMAL HIGH (ref 11.5–15.5)
WBC: 7.6 10*3/uL (ref 4.0–10.5)
nRBC: 0 % (ref 0.0–0.2)

## 2018-02-18 LAB — BASIC METABOLIC PANEL
Anion gap: 6 (ref 5–15)
BUN: 21 mg/dL (ref 8–23)
CO2: 19 mmol/L — ABNORMAL LOW (ref 22–32)
Calcium: 7.1 mg/dL — ABNORMAL LOW (ref 8.9–10.3)
Chloride: 112 mmol/L — ABNORMAL HIGH (ref 98–111)
Creatinine, Ser: 1.18 mg/dL (ref 0.61–1.24)
GFR calc Af Amer: >60 mL/min (ref >60)
Glucose, Bld: 102 mg/dL — ABNORMAL HIGH (ref 70–99)
POTASSIUM: 3.4 mmol/L — AB (ref 3.5–5.1)
SODIUM: 137 mmol/L (ref 135–145)

## 2018-02-18 LAB — PREPARE RBC (CROSSMATCH)

## 2018-02-18 MED ORDER — POTASSIUM CHLORIDE CRYS ER 20 MEQ PO TBCR
40.0000 meq | EXTENDED_RELEASE_TABLET | Freq: Once | ORAL | Status: AC
  Administered 2018-02-18: 40 meq via ORAL
  Filled 2018-02-18: qty 2

## 2018-02-18 NOTE — Plan of Care (Signed)

## 2018-02-18 NOTE — Evaluation (Signed)
Physical Therapy Evaluation Patient Details Name: Marvin Ortega MRN: [id number] DOB: 04-09-1950 Today's Date: 02/18/2018   History of Present Illness  Pt is a 67 y.o. male with a known history that includes BPH, GERD, and HTN presented to the emergency room with numerous black stools for over 24 hours, patient with complaints of lightheadedness, dizziness, generalized weakness, fatigue, in the emergency room patient was found to have heart rate in the 150s, hypotension, creatinine 1.3, lactic acid 5.5, hemoglobin 7.8, patient taken immediately to endoscopy by gastroenterology-had EGD done noted for esophagitis/Dieulafol lesion, patient placed on Protonix drip/octreotide drip per gastroenterology, sent to ICU, hospitalist asked for admission, patient is now been admitted for acute melena with associated shock.  Assessment includes: acute melena with acute hemorrhagic shock, elevated troponin likely due to demand ischemia, chronic alcohol abuse, HTN, and acute renal failure.    Clinical Impression  Pt presents with minor deficits in strength, transfers, mobility, gait, and activity tolerance but overall performed well during the session with BP 119/73 and HR/SpO2 WNL throughout on room air.  Pt required extra time and effort with bed mobility tasks and transfers but no physical assistance.  Pt reported feeling weaker than at baseline and began ambulating with a RW but quickly decided to walk without an AD.  Pt was ultimately steady walking without an AD with slow cadence and short B step length but fatigued after ambulating around 125' and returned to sitting.  Pt will benefit from HHPT services upon discharge to safely address above deficits for decreased risk of further functional decline and eventual return to PLOF.      Follow Up Recommendations Home health PT    Equipment Recommendations  None recommended by PT    Recommendations for Other Services       Precautions / Restrictions  Precautions Precautions: Fall Restrictions Weight Bearing Restrictions: No      Mobility  Bed Mobility Overal bed mobility: Modified Independent             General bed mobility comments: Extra time and effort but no physical assistance required  Transfers Overall transfer level: Needs assistance Equipment used: Rolling walker (2 wheeled) Transfers: Sit to/from Stand Sit to Stand: Supervision         General transfer comment: Fair eccentric and concentric control  Ambulation/Gait Ambulation/Gait assistance: Min guard Gait Distance (Feet): 125 Feet Assistive device: None Gait Pattern/deviations: Step-through pattern;Decreased step length - right;Decreased step length - left Gait velocity: Decreased   General Gait Details: Slow cadence with amb but steady including during sharp turns without an AD  Stairs            Wheelchair Mobility    Modified Rankin (Stroke Patients Only)       Balance Overall balance assessment: No apparent balance deficits (not formally assessed)                                           Pertinent Vitals/Pain Pain Assessment: No/denies pain    Home Living Family/patient expects to be discharged to:: Private residence Living Arrangements: Non-relatives/Friends Available Help at Discharge: Family;Available 24 hours/day Type of Home: House Home Access: Stairs to enter Entrance Stairs-Rails: Right Entrance Stairs-Number of Steps: 3 Home Layout: Multi-level;Able to live on main level with bedroom/bathroom Home Equipment: None      Prior Function Level of Independence: Independent  Comments: Ind amb community distances without an AD, no fall history, Ind with ADLs     Hand Dominance        Extremity/Trunk Assessment   Upper Extremity Assessment Upper Extremity Assessment: Overall WFL for tasks assessed    Lower Extremity Assessment Lower Extremity Assessment: Generalized weakness        Communication   Communication: No difficulties  Cognition Arousal/Alertness: Awake/alert Behavior During Therapy: WFL for tasks assessed/performed Overall Cognitive Status: Within Functional Limits for tasks assessed                                        General Comments      Exercises Total Joint Exercises Ankle Circles/Pumps: AROM;Both;10 reps;15 reps Quad Sets: Strengthening;Both;10 reps Gluteal Sets: Strengthening;Both;10 reps Towel Squeeze: Strengthening;Both;10 reps Heel Slides: AROM;Both;10 reps Hip ABduction/ADduction: AROM;Both;10 reps Straight Leg Raises: AROM;Both;10 reps Long Arc Quad: AROM;Both;10 reps;15 reps Knee Flexion: AROM;Both;10 reps;15 reps Marching in Standing: AROM;Both;10 reps;Standing   Assessment/Plan    PT Assessment Patient needs continued PT services  PT Problem List Decreased strength;Decreased activity tolerance       PT Treatment Interventions Gait training;Stair training;Functional mobility training;Therapeutic activities;Therapeutic exercise;Patient/family education;Balance training    PT Goals (Current goals can be found in the Care Plan section)  Acute Rehab PT Goals Patient Stated Goal: To get my strength back PT Goal Formulation: With patient Time For Goal Achievement: 03/03/18 Potential to Achieve Goals: Good    Frequency Min 2X/week   Barriers to discharge        Co-evaluation               AM-PAC PT "6 Clicks" Mobility  Outcome Measure Help needed turning from your back to your side while in a flat bed without using bedrails?: None Help needed moving from lying on your back to sitting on the side of a flat bed without using bedrails?: None Help needed moving to and from a bed to a chair (including a wheelchair)?: A Little Help needed standing up from a chair using your arms (e.g., wheelchair or bedside chair)?: A Little Help needed to walk in hospital room?: A Little Help needed climbing  3-5 steps with a railing? : A Little 6 Click Score: 20    End of Session Equipment Utilized During Treatment: Gait belt Activity Tolerance: Patient tolerated treatment well Patient left: in chair;with chair alarm set;with call bell/phone within reach Nurse Communication: Mobility status PT Visit Diagnosis: Muscle weakness (generalized) (M62.81);Difficulty in walking, not elsewhere classified (R26.2)    Time: [id number] PT Time Calculation (min) (ACUTE ONLY): 29 min   Charges:   PT Evaluation $PT Eval Low Complexity: 1 Low PT Treatments $Therapeutic Exercise: 8-22 mins        D. Scott Eddye Broxterman PT, DPT 02/18/18, 10:45 AM

## 2018-02-18 NOTE — Progress Notes (Addendum)
[facility] [place] [facility]   PATIENT NAME: Marvin Ortega    MR#:  [id number]  DATE OF BIRTH:  08-Jul-1950  SUBJECTIVE:   No complaints this morning.  Denies any hematochezia, melena, abdominal pain, nausea, vomiting.  REVIEW OF SYSTEMS:  Review of Systems  Constitutional: Positive for malaise/fatigue. Negative for chills and fever.  HENT: Negative for congestion and sore throat.   Eyes: Negative for blurred vision and double vision.  Respiratory: Negative for cough and shortness of breath.   Cardiovascular: Negative for chest pain, palpitations and leg swelling.  Gastrointestinal: Negative for abdominal pain, nausea and vomiting.  Genitourinary: Negative for dysuria and urgency.  Musculoskeletal: Negative for back pain and neck pain.  Neurological: Negative for dizziness and headaches.  Psychiatric/Behavioral: Negative for depression. The patient is not nervous/anxious.     DRUG ALLERGIES:  No Known Allergies VITALS:  Blood pressure 119/73, pulse 86, temperature 98.8 F (37.1 C), temperature source Oral, resp. rate 17, height 5\' 6"  (1.676 m), weight 68.5 kg, SpO2 93 %. PHYSICAL EXAMINATION:  Physical Exam  GENERAL:  67 y.o.-year-old patient lying in the bed with no acute distress.  EYES: Pupils equal, round, reactive to light and accommodation. No scleral icterus. Extraocular muscles intact.  HEENT: Head atraumatic, normocephalic. Oropharynx and nasopharynx clear.  Moist mucous membranes. NECK:  Supple, no jugular venous distention. No thyroid enlargement, no tenderness.  LUNGS: Normal breath sounds bilaterally, no wheezing, rales,rhonchi or crepitation. No use of accessory muscles of respiration.  CARDIOVASCULAR: RRR, S1, S2 normal. No murmurs, rubs, or gallops.  ABDOMEN: Soft, nontender, nondistended. Bowel sounds present. No organomegaly or mass.  EXTREMITIES: No pedal edema, cyanosis, or clubbing.  NEUROLOGIC: Cranial nerves II through XII are  intact. + Global weakness. Sensation intact. Gait not checked.  PSYCHIATRIC: The patient is alert and oriented x 3.  SKIN: No obvious rash, lesion, or ulcer.  LABORATORY PANEL:  Male CBC Recent Labs  Lab 02/18/18 0730  WBC 7.6  HGB 10.4*  HCT 31.0*  PLT 70*   ------------------------------------------------------------------------------------------------------------------ Chemistries  Recent Labs  Lab 02/16/18 0328  02/18/18 0730  NA 144   < > 137  K 4.3   < > 3.4*  CL 117*   < > 112*  CO2 23   < > 19*  GLUCOSE 174*   < > 102*  BUN 52*   < > 21  CREATININE 1.16   < > 1.18  CALCIUM 7.8*   < > 7.1*  AST 39  --   --   ALT 26  --   --   ALKPHOS 61  --   --   BILITOT 1.6*  --   --    < > = values in this interval not displayed.   RADIOLOGY:  No results found. ASSESSMENT AND PLAN:   Acute melena with acute hemorrhagic shock- resolved after emergent EGD that showed a Dieulafoy lesion.  Hemoglobin has been stable over the last 3 days. -Continue Protonix and octreotide drips x 72 hours (last day today) -Stop IV fluids -Advance to full liquid diet -GI following  Elevated troponin-likely due to demand ischemia.  Troponins peaked at 0.13 and then down trended.  No active chest pain. -Monitor  Chronic alcohol abuse- stable -CIWA -Thiamine, folate, multivitamin  Hypertension- had a low blood pressure this morning -Continue holding home antihypertensives  History of GERD -On Protonix drip, will plan to discharge on p.o. Protonix twice daily  Chronic BPH- stable -Continue Flomax  All the records are reviewed and case discussed with Care Management/Social Worker. Management plans discussed with the patient, family and they are in agreement.  CODE STATUS: Full Code  TOTAL TIME TAKING CARE OF THIS PATIENT: 35 minutes.   More than 50% of the time was spent in counseling/coordination of care: YES  POSSIBLE D/C tomorrow, DEPENDING ON CLINICAL CONDITION.   Jinny Blossom  Mieke Brinley M.D on 02/18/2018 at 12:39 PM  Between 7am to 6pm - Pager - [phone]  After 6pm go to [email or web address] - Social research officer, government  [facility]  Office  [phone]  CC: Primary care physician; Kandyce Rud, MD  Note: This dictation was prepared with Dragon dictation along with smaller phrase technology. Any transcriptional errors that result from this process are unintentional.

## 2018-02-18 NOTE — Progress Notes (Signed)
Marvin Miniumarren Lanier Felty, MD [facility]   [address] [place], [place] [address] Phone: [phone] Fax : [phone]   Subjective: The patient reports that he has not had any further signs of GI bleeding such as black stools or bloody stools.  He denies any abdominal pain.  He also states that he is tolerating his p.o. diet.  Patient's hemoglobin yesterday was 9.9 and today it is 10.4.   Objective: Vital signs in last 24 hours: Vitals:   02/18/18 0352 02/18/18 0500 02/18/18 0930 02/18/18 1338  BP: (!) 97/53  119/73 108/68  Pulse: 69  86 79  Resp: 17   18  Temp: 98.8 F (37.1 C)   99.6 F (37.6 C)  TempSrc: Oral   Oral  SpO2: 93%  93% 95%  Weight:  68.5 kg    Height:       Weight change: 2.7 kg  Intake/Output Summary (Last 24 hours) at 02/18/2018 1415 Last data filed at 02/18/2018 1345 Gross per 24 hour  Intake 4461.35 ml  Output 1100 ml  Net 3361.35 ml     Exam: Heart:: Regular rate and rhythm, S1S2 present or without murmur or extra heart sounds Lungs: normal and clear to auscultation and percussion Abdomen: soft, nontender, normal bowel sounds   Lab Results: @LABTEST2 @ Micro Results: Recent Results (from the past 240 hour(s))  MRSA PCR Screening     Status: None   Collection Time: 02/15/18  8:49 PM  Result Value Ref Range Status   MRSA by PCR NEGATIVE NEGATIVE Final    Comment:        The GeneXpert MRSA Assay (FDA approved for NASAL specimens only), is one component of a comprehensive MRSA colonization surveillance program. It is not intended to diagnose MRSA infection nor to guide or monitor treatment for MRSA infections. Performed at [facility], [address] [place], [place] [address]   CULTURE, BLOOD (ROUTINE X 2) w Reflex to ID Panel     Status: None (Preliminary result)   Collection Time: 02/16/18  3:00 AM  Result Value Ref Range Status   Specimen Description BLOOD BLOOD RIGHT HAND  Final   Special Requests   Final    BOTTLES DRAWN  AEROBIC AND ANAEROBIC Blood Culture adequate volume   Culture   Final    NO GROWTH 2 DAYS Performed at [facility], [address] [place], [place] [address]    Report Status PENDING  Incomplete  CULTURE, BLOOD (ROUTINE X 2) w Reflex to ID Panel     Status: None (Preliminary result)   Collection Time: 02/16/18  3:28 AM  Result Value Ref Range Status   Specimen Description BLOOD BLOOD LEFT HAND  Final   Special Requests   Final    BOTTLES DRAWN AEROBIC AND ANAEROBIC Blood Culture results may not be optimal due to an excessive volume of blood received in culture bottles   Culture   Final    NO GROWTH 2 DAYS Performed at [facility], [address] [place], [place] [address]    Report Status PENDING  Incomplete   Studies/Results: No results found. Medications: I have reviewed the patient's current medications. Scheduled Meds: . folic acid  1 mg Oral Daily  . multivitamin with minerals  1 tablet Oral Daily  . octreotide  50 mcg Intravenous Once  . [START ON 02/19/2018] pantoprazole  40 mg Intravenous Q12H  . tamsulosin  0.4 mg Oral QPC supper  . thiamine  100 mg Oral Daily  Or  . thiamine  100 mg Intravenous Daily   Continuous Infusions: . dexmedetomidine (PRECEDEX) IV infusion Stopped (02/16/18 0840)  . octreotide  (SANDOSTATIN)    IV infusion 50 mcg/hr (02/17/18 2200)  . pantoprazole (PROTONIX) IVPB Stopped (02/15/18 2137)  . pantoprozole (PROTONIX) infusion 8 mg/hr (02/18/18 1347)   PRN Meds:.acetaminophen, albuterol, ipratropium, LORazepam, ondansetron (ZOFRAN) IV   Assessment: Active Problems:   Hemorrhagic shock (HCC)   Upper GI bleed   Dieulafoy lesion of stomach   GIB (gastrointestinal bleeding)    Plan: The patient has had no further sign of any GI bleeding.  The patient has been doing well and states that he was told he may go home tomorrow.  There is nothing further to do from a GI point of view and the patient should follow-up with  Dr. Allegra Lai as an outpatient after discharge.  The patient has been explained the plan and agrees with it.   LOS: 3 days   Marvin Minium 02/18/2018, 2:15 PM

## 2018-02-18 NOTE — Care Management Important Message (Signed)
Important Message  Patient Details  Name: Marvin Ortega MRN: [id number] Date of Birth: 01-05-1951   Medicare Important Message Given:  Yes    Olegario MessierKathy A Sebastyan Snodgrass 02/18/2018, 12:06 PM

## 2018-02-19 LAB — CBC
HCT: 29.5 % — ABNORMAL LOW (ref 39.0–52.0)
Hemoglobin: 9.9 g/dL — ABNORMAL LOW (ref 13.0–17.0)
MCH: 32 pg (ref 26.0–34.0)
MCHC: 33.6 g/dL (ref 30.0–36.0)
MCV: 95.5 fL (ref 80.0–100.0)
Platelets: 73 10*3/uL — ABNORMAL LOW (ref 150–400)
RBC: 3.09 MIL/uL — ABNORMAL LOW (ref 4.22–5.81)
RDW: 18.8 % — AB (ref 11.5–15.5)
WBC: 6.9 10*3/uL (ref 4.0–10.5)
nRBC: 0 % (ref 0.0–0.2)

## 2018-02-19 LAB — BASIC METABOLIC PANEL
Anion gap: 6 (ref 5–15)
BUN: 15 mg/dL (ref 8–23)
CALCIUM: 7.3 mg/dL — AB (ref 8.9–10.3)
CO2: 20 mmol/L — ABNORMAL LOW (ref 22–32)
CREATININE: 1.17 mg/dL (ref 0.61–1.24)
Chloride: 110 mmol/L (ref 98–111)
GFR calc Af Amer: >60 mL/min (ref >60)
GFR calc non Af Amer: >60 mL/min (ref >60)
Glucose, Bld: 110 mg/dL — ABNORMAL HIGH (ref 70–99)
Potassium: 3.4 mmol/L — ABNORMAL LOW (ref 3.5–5.1)
Sodium: 136 mmol/L (ref 135–145)

## 2018-02-19 LAB — MAGNESIUM: Magnesium: 1.6 mg/dL — ABNORMAL LOW (ref 1.7–2.4)

## 2018-02-19 MED ORDER — POTASSIUM CHLORIDE CRYS ER 20 MEQ PO TBCR
40.0000 meq | EXTENDED_RELEASE_TABLET | Freq: Once | ORAL | Status: AC
  Administered 2018-02-19: 08:00:00 40 meq via ORAL
  Filled 2018-02-19: qty 2

## 2018-02-19 MED ORDER — POTASSIUM CHLORIDE CRYS ER 20 MEQ PO TBCR
40.0000 meq | EXTENDED_RELEASE_TABLET | Freq: Once | ORAL | Status: AC
  Administered 2018-02-19: 40 meq via ORAL
  Filled 2018-02-19: qty 2

## 2018-02-19 MED ORDER — PANTOPRAZOLE SODIUM 40 MG PO TBEC: 40.0000 mg | DELAYED_RELEASE_TABLET | Freq: Two times a day (BID) | ORAL | 0 refills | Status: DC

## 2018-02-19 MED ORDER — MAGNESIUM SULFATE 2 GM/50ML IV SOLN
2.0000 g | Freq: Once | INTRAVENOUS | Status: AC
  Administered 2018-02-19: 2 g via INTRAVENOUS
  Filled 2018-02-19: qty 50

## 2018-02-19 MED ORDER — PANTOPRAZOLE SODIUM 40 MG PO TBEC
40.0000 mg | DELAYED_RELEASE_TABLET | Freq: Two times a day (BID) | ORAL | Status: DC
  Administered 2018-02-19: 08:00:00 40 mg via ORAL
  Filled 2018-02-19: qty 1

## 2018-02-19 NOTE — Consult Note (Signed)
PHARMACY CONSULT NOTE - FOLLOW UP  Pharmacy Consult for Electrolyte Monitoring and Replacement   Recent Labs: Potassium (mmol/L)  Date Value  02/19/2018 3.4 (L)   Magnesium (mg/dL)  Date Value  16/10/960412/13/2019 1.6 (L)   Calcium (mg/dL)  Date Value  54/09/811912/13/2019 7.3 (L)   Albumin (g/dL)  Date Value  14/78/295612/12/2017 2.6 (L)  ]   Assessment: Pharmacy has been consulted to replete potassium and magnesium  Goal of Therapy:  K~ 4 Mg ~2  Plan:  Mg IV 2g x 1 and KCl tabs 40meq x 1  Albina Billetharles M Trinadee Verhagen ,PharmD Clinical Pharmacist 02/19/2018 8:21 AM

## 2018-02-19 NOTE — Discharge Instructions (Signed)
It was so nice to meet you during this hospitalization!  You came into the hospital because you were having bleeding from your stomach. The stomach doctor did an endoscopy, which showed that you had an artery bleeding in your stomach. She was able to stop the bleeding.  I have started a new medication called Protonix to help heal your stomach lining. Please take this twice a day.  I would also recommend STOPPING your aspirin for now. Your primary care doctor may choose to restart it.  Take care, Dr. Nancy MarusMayo

## 2018-02-19 NOTE — Discharge Summary (Signed)
[employer] [facility] [place] [facility]   PATIENT NAME: Marvin Ortega    MR#:  [id number]  DATE OF BIRTH:  Jun 25, 1950  DATE OF ADMISSION:  02/15/2018   ADMITTING PHYSICIAN: Toney Reil, MD  DATE OF DISCHARGE: 02/19/18  PRIMARY CARE PHYSICIAN: Kandyce Rud, MD   ADMISSION DIAGNOSIS:  Hemorrhagic shock (HCC) [R57.8] Alcohol abuse [F10.10] Thrombocytopenia (HCC) [D69.6] Upper GI bleed [K92.2] Atrial fibrillation with RVR (HCC) [I48.91] Demand ischemia of myocardium (HCC) [I24.8] DISCHARGE DIAGNOSIS:  Active Problems:   Hemorrhagic shock (HCC)   Upper GI bleed   Dieulafoy lesion of stomach   GIB (gastrointestinal bleeding)  SECONDARY DIAGNOSIS:   Past Medical History:  Diagnosis Date  . BPH (benign prostatic hyperplasia)   . GERD (gastroesophageal reflux disease)   . Hypertension    HOSPITAL COURSE:   Marvin Ortega is a 67 year old male who presented to the ED with multiple episodes of melena, lightheadedness, dizziness, generalized weakness.  In the ED, heart rates were in the 150s and he was hypotensive with a hemoglobin of 7.8.  He was taken immediately to endoscopy by gastroenterology.    Acute melena withacute hemorrhagic shock- resolved  -EGD that showed a Dieulafoy lesion.   -Hemoglobin remained stable after EGD -Treated with Protonix and octreotide drips x 72 hours  -Discharged home on Protonix 40 mg twice daily -Aspirin held on discharge, can consider restarting as an outpatient -Follow-up with GI as an outpatient  Elevated troponin-likely due to demand ischemia.  Troponins peaked at 0.13 and then down trended.  No active chest pain.  Chronicalcohol abuse- stable -CIWA -Given thiamine, folate, multivitamin  Hypertension- normotensive on the day of discharge -Blood pressure meds initially held due to hemorrhagic shock, but were restarted on the day of discharge  History of GERD -Discharged home on Protonix 40 mg twice daily, in  addition to Pepcid.  Chronic BPH- stable -Continued Flomax  Generalized weakness-likely secondary to deconditioning -Seen by physical therapy, who recommended home health PT  DISCHARGE CONDITIONS:  Acute melena-resolved Elevated troponin Chronic alcohol abuse Hypertension History of GERD Chronic BPH CONSULTS OBTAINED:  Treatment Team:  Toney Reil, MD DRUG ALLERGIES:  No Known Allergies DISCHARGE MEDICATIONS:   Allergies as of 02/19/2018   No Known Allergies     Medication List    STOP taking these medications   aspirin EC 81 MG tablet     TAKE these medications   famotidine 20 MG tablet Commonly known as:  PEPCID Take 20 mg by mouth 2 (two) times daily.   finasteride 5 MG tablet Commonly known as:  PROSCAR Take 5 mg by mouth daily.   metoprolol succinate 50 MG 24 hr tablet Commonly known as:  TOPROL-XL Take 100 mg by mouth daily.   pantoprazole 40 MG tablet Commonly known as:  PROTONIX Take 1 tablet (40 mg total) by mouth 2 (two) times daily.   silodosin 8 MG Caps capsule Commonly known as:  RAPAFLO Take 8 mg by mouth daily.   vitamin B-1 250 MG tablet Take 250 mg by mouth daily.        DISCHARGE INSTRUCTIONS:  1.  Follow-up with PCP in 5 days 2.  Follow-up with gastroenterology in 2 weeks 3.  Take Protonix 40 mg twice daily 4.  Aspirin held on discharge, may be restarted as an outpatient if no additional signs of GI bleeding DIET:  Cardiac diet DISCHARGE CONDITION:  Stable ACTIVITY:  Activity as tolerated OXYGEN:  Home Oxygen: No.  Oxygen Delivery:  room air DISCHARGE LOCATION:  home   If you experience worsening of your admission symptoms, develop shortness of breath, life threatening emergency, suicidal or homicidal thoughts you must seek medical attention immediately by calling 911 or calling your MD immediately  if symptoms less severe.  You Must read complete instructions/literature along with all the possible adverse  reactions/side effects for all the Medicines you take and that have been prescribed to you. Take any new Medicines after you have completely understood and accpet all the possible adverse reactions/side effects.   Please note  You were cared for by a hospitalist during your hospital stay. If you have any questions about your discharge medications or the care you received while you were in the hospital after you are discharged, you can call the unit and asked to speak with the hospitalist on call if the hospitalist that took care of you is not available. Once you are discharged, your primary care physician will handle any further medical issues. Please note that NO REFILLS for any discharge medications will be authorized once you are discharged, as it is imperative that you return to your primary care physician (or establish a relationship with a primary care physician if you do not have one) for your aftercare needs so that they can reassess your need for medications and monitor your lab values.    On the day of Discharge:  VITAL SIGNS:  Blood pressure 130/72, pulse 66, temperature 98.6 F (37 C), temperature source Oral, resp. rate 16, height 5\' 6"  (1.676 m), weight 68.6 kg, SpO2 97 %. PHYSICAL EXAMINATION:  GENERAL:  67 y.o.-year-old patient lying in the bed with no acute distress.  EYES: Pupils equal, round, reactive to light and accommodation. No scleral icterus. Extraocular muscles intact.  HEENT: Head atraumatic, normocephalic. Oropharynx and nasopharynx clear.  NECK:  Supple, no jugular venous distention. No thyroid enlargement, no tenderness.  LUNGS: Normal breath sounds bilaterally, no wheezing, rales,rhonchi or crepitation. No use of accessory muscles of respiration.  CARDIOVASCULAR: RRR, S1, S2 normal. No murmurs, rubs, or gallops.  ABDOMEN: Soft, non-tender, non-distended. Bowel sounds present. No organomegaly or mass.  EXTREMITIES: No pedal edema, cyanosis, or clubbing.  NEUROLOGIC:  Cranial nerves II through XII are intact. + Global weakness. Sensation intact. Gait not checked.  PSYCHIATRIC: The patient is alert and oriented x 3.  SKIN: No obvious rash, lesion, or ulcer.  DATA REVIEW:   CBC Recent Labs  Lab 02/19/18 0258  WBC 6.9  HGB 9.9*  HCT 29.5*  PLT 73*    Chemistries  Recent Labs  Lab 02/16/18 0328  02/19/18 0258  NA 144   < > 136  K 4.3   < > 3.4*  CL 117*   < > 110  CO2 23   < > 20*  GLUCOSE 174*   < > 110*  BUN 52*   < > 15  CREATININE 1.16   < > 1.17  CALCIUM 7.8*   < > 7.3*  MG  --   --  1.6*  AST 39  --   --   ALT 26  --   --   ALKPHOS 61  --   --   BILITOT 1.6*  --   --    < > = values in this interval not displayed.     Microbiology Results  Results for orders placed or performed during the hospital encounter of 02/15/18  MRSA PCR Screening     Status: None   Collection Time:  02/15/18  8:49 PM  Result Value Ref Range Status   MRSA by PCR NEGATIVE NEGATIVE Final    Comment:        The GeneXpert MRSA Assay (FDA approved for NASAL specimens only), is one component of a comprehensive MRSA colonization surveillance program. It is not intended to diagnose MRSA infection nor to guide or monitor treatment for MRSA infections. Performed at [facility], [address] [place], [place] [address]   CULTURE, BLOOD (ROUTINE X 2) w Reflex to ID Panel     Status: None (Preliminary result)   Collection Time: 02/16/18  3:00 AM  Result Value Ref Range Status   Specimen Description BLOOD BLOOD RIGHT HAND  Final   Special Requests   Final    BOTTLES DRAWN AEROBIC AND ANAEROBIC Blood Culture adequate volume   Culture   Final    NO GROWTH 3 DAYS Performed at [facility], [address] [place], [place] [address]    Report Status PENDING  Incomplete  CULTURE, BLOOD (ROUTINE X 2) w Reflex to ID Panel     Status: None (Preliminary result)   Collection Time: 02/16/18  3:28 AM  Result Value Ref Range Status    Specimen Description BLOOD BLOOD LEFT HAND  Final   Special Requests   Final    BOTTLES DRAWN AEROBIC AND ANAEROBIC Blood Culture results may not be optimal due to an excessive volume of blood received in culture bottles   Culture   Final    NO GROWTH 3 DAYS Performed at [facility], [address] [place], [place] [address]    Report Status PENDING  Incomplete    RADIOLOGY:  No results found.   Management plans discussed with the patient, family and they are in agreement.  CODE STATUS: Full Code   TOTAL TIME TAKING CARE OF THIS PATIENT: 35 minutes.    Jinny BlossomKaty D Dejai Schubach M.D on 02/19/2018 at 1:37 PM  Between 7am to 6pm - Pager - [phone]  After 6pm go to [email or web address] - Social research officer, governmentpassword [email or web address]  [employer]  Hospitalists  Office  [phone]  CC: Primary care physician; Kandyce RudBabaoff, [name], MD   Note: This dictation was prepared with Dragon dictation along with smaller phrase technology. Any transcriptional errors that result from this process are unintentional.

## 2018-02-19 NOTE — Progress Notes (Signed)
Pt  Alert and oriented times 4. Discharged instructions provided to pt. And reviewed. No questions presented by pt. Following review. Unit Number provided for and future clarification needed. Hard copy prescription provided with review of discharge instructions. Pt awaiting completion of IV medication before removal of IV and transition to self care at home

## 2018-02-19 NOTE — Care Management Note (Signed)
Case Management Note  Patient Details  Name: Marvin Ortega MRN: [id number] Date of Birth: 1950/08/08  Subjective/Objective:    Admitted to [facility] with the diagnosis of hemorrhagic shock, Lives with wife, Lucendia HerrlichFaye [phone]). Seen Dr, Madelin RearBaboff last Monday. [facility] [address].  No home health. No skilled facility.   No home oxygen. Cane in the home, if needed.   Takes care of all basic activities of daily living himself, drives. No falls. Good appetite. Wife will transport           Action/Plan: Physical therapy evaluation completed. Recommending home with physical therapy. Query for zip code [phone] completed. One copy on chart and one copy given to [name]. Discussed agencies. Declining these services at this time. States that [facility] usually sends a nurse to see me.      Expected Discharge Date:                  Expected Discharge Plan:     In-House Referral:   yes  Discharge planning Services   yes  Post Acute Care Choice:   yes Choice offered to:   [name].   DME Arranged:    DME Agency:     HH Arranged:    HH Agency:    Declining  Status of Service:     If discussed at Long Length of Stay Meetings, dates discussed:    Additional Comments:  Gwenette GreetBrenda S Nayali Talerico, RN MSN CCM Care Management [phone] 02/19/2018, 8:26 AM

## 2018-02-21 LAB — CULTURE, BLOOD (ROUTINE X 2)
Culture: NO GROWTH
Culture: NO GROWTH
Special Requests: ADEQUATE

## 2018-02-25 DIAGNOSIS — I480 Paroxysmal atrial fibrillation: Secondary | ICD-10-CM | POA: Insufficient documentation

## 2018-02-25 NOTE — Anesthesia Postprocedure Evaluation (Signed)
Anesthesia Post Note  Patient: Marvin Ortega  Procedure(s) Performed: ESOPHAGOGASTRODUODENOSCOPY (EGD) (N/A )  Anesthesia Type: General Comments: Patient discharged prior to anesthesia evaluation     Last Vitals:  Vitals:   02/19/18 0015 02/19/18 0427  BP: 126/70 130/72  Pulse: 72 66  Resp: 17 16  Temp: 37.1 C 37 C  SpO2: 99% 97%    Last Pain:  Vitals:   02/19/18 0750  TempSrc:   PainSc: 0-No pain                 [name],  [name]

## 2018-03-29 ENCOUNTER — Encounter: Payer: Self-pay | Admitting: Gastroenterology

## 2018-03-29 ENCOUNTER — Ambulatory Visit (INDEPENDENT_AMBULATORY_CARE_PROVIDER_SITE_OTHER): Payer: Medicare HMO | Admitting: Gastroenterology

## 2018-03-29 VITALS — BP 127/77 | HR 68 | Resp 17 | Ht 66.0 in | Wt 146.8 lb

## 2018-03-29 DIAGNOSIS — K3182 Dieulafoy lesion (hemorrhagic) of stomach and duodenum: Secondary | ICD-10-CM

## 2018-03-29 DIAGNOSIS — R945 Abnormal results of liver function studies: Secondary | ICD-10-CM | POA: Diagnosis not present

## 2018-03-29 DIAGNOSIS — D62 Acute posthemorrhagic anemia: Secondary | ICD-10-CM | POA: Diagnosis not present

## 2018-03-29 DIAGNOSIS — R7989 Other specified abnormal findings of blood chemistry: Secondary | ICD-10-CM

## 2018-03-29 NOTE — Progress Notes (Addendum)
Marvin Repress, MD [address]  [address]  [place], [place] [address]  Main: [phone]  Fax: [phone]    Gastroenterology Consultation  Referring Provider:     Kandyce Rud, MD Primary Care Physician:  Kandyce Rud, MD Primary Gastroenterologist:  Dr. Arlyss Ortega Reason for Consultation:  Hospital f/u, recent upper GI bleed         HPI:   Marvin Ortega is a 68 y.o. African-American male referred by Dr. Kandyce Rud, MD  for consultation & management of recent hospitalization at [facility] for massive upper GI bleed, 02/15/2018 to 02/19/2018.  Patient presented with hematemesis and melena, acute blood loss anemia, hemorrhagic shock, underwent emergent EGD and was found to have actively bleeding Diuelafoy lesion in the stomach, hemostasis achieved with placement of clips.  Patient was extubated and was discharged home on Protonix.  Patient has history of heavy alcohol use with possible underlying alcoholic liver disease.  There was no evidence of varices or portal hypertensive gastropathy on recent EGD  Interval summary Patient is accompanied by his spouse today.  He denies any symptoms today.  He denies recurrent episodes of melena, hematemesis since discharge.  He reports that he does not drink alcohol at least at home.  He denies abdominal pain, nausea or vomiting.  He reports having regular bowel movements.  He denies fatigue, shortness of breath, chest pain.  He is taking Protonix daily  NSAIDs: None  Antiplts/Anticoagulants/Anti thrombotics: None  GI Procedures:  EGD 02/15/18 - Normal duodenal bulb and second portion of the duodenum. - Hematin (altered blood/coffee-ground-like material) in the entire stomach. - Dieulafoy lesion of stomach. - Small hiatal hernia. - LA Grade D erosive esophagitis. - No specimens collected.  Past Medical History:  Diagnosis Date  . BPH (benign prostatic hyperplasia)   . GERD (gastroesophageal reflux disease)   .  Hypertension     Past Surgical History:  Procedure Laterality Date  . ESOPHAGOGASTRODUODENOSCOPY N/A 02/15/2018   Procedure: ESOPHAGOGASTRODUODENOSCOPY (EGD);  Surgeon: Toney Reil, MD;  Location: [facility] ENDOSCOPY;  Service: Gastroenterology;  Laterality: N/A;    Current Outpatient Medications:  .  finasteride (PROSCAR) 5 MG tablet, Take 5 mg by mouth daily., Disp: , Rfl:  .  metoprolol succinate (TOPROL-XL) 50 MG 24 hr tablet, Take 100 mg by mouth daily., Disp: , Rfl:  .  pantoprazole (PROTONIX) 40 MG tablet, Take 1 tablet (40 mg total) by mouth 2 (two) times daily., Disp: 60 tablet, Rfl: 0 .  silodosin (RAPAFLO) 8 MG CAPS capsule, Take 8 mg by mouth daily., Disp: , Rfl: 11 .  Thiamine HCl (VITAMIN B-1) 250 MG tablet, Take 250 mg by mouth daily., Disp: , Rfl:  .  famotidine (PEPCID) 20 MG tablet, Take 20 mg by mouth 2 (two) times daily., Disp: , Rfl: 3   Family History  Problem Relation Age of Onset  . Heart disease Father   . Aneurysm Sister      Social History   Tobacco Use  . Smoking status: Never Smoker  . Smokeless tobacco: Never Used  Substance Use Topics  . Alcohol use: Yes  . Drug use: Not on file    Allergies as of 03/29/2018  . (No Known Allergies)    Review of Systems:    All systems reviewed and negative except where noted in HPI.   Physical Exam:  BP 127/77 (BP Location: Left Arm, Patient Position: Sitting, Cuff Size: Normal)   Pulse 68   Resp 17  Ht 5\' 6"  (1.676 m)   Wt 146 lb 12.8 oz (66.6 kg)   BMI 23.69 kg/m  No LMP for male patient.  General:   Alert,  Well-developed, well-nourished, pleasant and cooperative in NAD Head:  Normocephalic and atraumatic. Eyes:  Sclera clear, no icterus.   Conjunctiva pink. Ears:  Normal auditory acuity. Nose:  No deformity, discharge, or lesions. Mouth:  No deformity or lesions,oropharynx pink & moist. Neck:  Supple; no masses or thyromegaly. Lungs:  Respirations even and unlabored.  Clear throughout to  auscultation.   No wheezes, crackles, or rhonchi. No acute distress. Heart:  Regular rate and rhythm; no murmurs, clicks, rubs, or gallops. Abdomen:  Normal bowel sounds. Soft, non-tender and non-distended without masses, hepatosplenomegaly or hernias noted.  No guarding or rebound tenderness.   Rectal: Not performed Msk:  Symmetrical without gross deformities. Good, equal movement & strength bilaterally. Pulses:  Normal pulses noted. Extremities:  No clubbing or edema.  No cyanosis. Neurologic:  Alert and oriented x3;  grossly normal neurologically. Skin:  Intact without significant lesions or rashes. No jaundice. Psych:  Alert and cooperative. Normal mood and affect.  Imaging Studies: Reviewed  Assessment and Plan:   Marvin Ortega is a 68 y.o. African-American male with history of heavy alcohol use, with recent massive upper GI bleed secondary to Dieulafoy lesion in the stomach, hemostasis achieved with placement of hemoclips Patient is currently asymptomatic, with no signs or symptoms to suggest recurrent GI bleed He also has severe erosive esophagitis  Continue Protonix 40 mg twice daily Check CBC, iron studies, B12 and folate levels  History of alcohol use Patient developed thrombocytopenia during the hospital stay most likely secondary to active alcohol use Ultrasound right upper quadrant did not reveal cirrhosis, patent portal vein Recheck platelet count, LFTs today If he has ongoing thrombocytopenia, will perform ultrasound to evaluate for splenomegaly Check viral hepatitis panel Maintain abstinence from alcohol  Colon cancer screening Patient had a colonoscopy in 03/2014 at [facility], report not available Will obtain colonoscopy and pathology report  Follow up in 4 weeks    Addendum 04/19/2018 Colonoscopy and pathology results obtained, scanned into his chart Bowel prep excellent Recommend surveillance colonoscopy in 03/2019   Marvin Repress, MD

## 2018-03-30 LAB — IRON AND TIBC
Iron Saturation: 20 % (ref 15–55)
Iron: 60 ug/dL (ref 38–169)
Total Iron Binding Capacity: 293 ug/dL (ref 250–450)
UIBC: 233 ug/dL (ref 111–343)

## 2018-03-30 LAB — FERRITIN: Ferritin: 69 ng/mL (ref 30–400)

## 2018-03-30 LAB — COMPREHENSIVE METABOLIC PANEL
ALT: 16 IU/L (ref 0–44)
AST: 28 IU/L (ref 0–40)
Albumin/Globulin Ratio: 1 — ABNORMAL LOW (ref 1.2–2.2)
Albumin: 3.3 g/dL — ABNORMAL LOW (ref 3.8–4.8)
Alkaline Phosphatase: 118 IU/L — ABNORMAL HIGH (ref 39–117)
BUN/Creatinine Ratio: 14 (ref 10–24)
BUN: 14 mg/dL (ref 8–27)
Bilirubin Total: 0.8 mg/dL (ref 0.0–1.2)
CALCIUM: 9 mg/dL (ref 8.6–10.2)
CO2: 20 mmol/L (ref 20–29)
Chloride: 105 mmol/L (ref 96–106)
Creatinine, Ser: 0.97 mg/dL (ref 0.76–1.27)
GFR calc Af Amer: 92 mL/min/{1.73_m2} (ref >59)
GFR calc non Af Amer: 79 mL/min/{1.73_m2} (ref >59)
Globulin, Total: 3.4 g/dL (ref 1.5–4.5)
Glucose: 103 mg/dL — ABNORMAL HIGH (ref 65–99)
Potassium: 4 mmol/L (ref 3.5–5.2)
Sodium: 139 mmol/L (ref 134–144)
Total Protein: 6.7 g/dL (ref 6.0–8.5)

## 2018-03-30 LAB — HEPATITIS PANEL, ACUTE
HEP B S AG: NEGATIVE
Hep A IgM: NEGATIVE
Hep B C IgM: NEGATIVE
Hep C Virus Ab: <0.1 s/co ratio (ref 0.0–0.9)

## 2018-03-30 LAB — VITAMIN B12: Vitamin B-12: 774 pg/mL (ref 232–1245)

## 2018-03-30 LAB — CBC
Hematocrit: 32.9 % — ABNORMAL LOW (ref 37.5–51.0)
Hemoglobin: 11.6 g/dL — ABNORMAL LOW (ref 13.0–17.7)
MCH: 33.3 pg — ABNORMAL HIGH (ref 26.6–33.0)
MCHC: 35.3 g/dL (ref 31.5–35.7)
MCV: 95 fL (ref 79–97)
Platelets: 153 10*3/uL (ref 150–450)
RBC: 3.48 x10E6/uL — ABNORMAL LOW (ref 4.14–5.80)
RDW: 16.7 % — ABNORMAL HIGH (ref 11.6–15.4)
WBC: 6.2 10*3/uL (ref 3.4–10.8)

## 2018-03-30 LAB — FOLATE: Folate: 6.6 ng/mL (ref >3.0)

## 2018-04-12 ENCOUNTER — Encounter: Payer: Self-pay | Admitting: Gastroenterology

## 2018-04-27 ENCOUNTER — Encounter: Payer: Self-pay | Admitting: Gastroenterology

## 2018-04-27 ENCOUNTER — Ambulatory Visit (INDEPENDENT_AMBULATORY_CARE_PROVIDER_SITE_OTHER): Payer: Medicare HMO | Admitting: Gastroenterology

## 2018-04-27 ENCOUNTER — Other Ambulatory Visit: Payer: Self-pay

## 2018-04-27 VITALS — BP 124/79 | HR 71 | Resp 17 | Ht 66.0 in | Wt 147.6 lb

## 2018-04-27 DIAGNOSIS — K2210 Ulcer of esophagus without bleeding: Secondary | ICD-10-CM

## 2018-04-27 NOTE — Progress Notes (Signed)
Arlyss Repress, MD [address]  [address]  [place], [place] [address]  Main: [phone]  Fax: [phone]    Gastroenterology Consultation  Referring Provider:     Kandyce Rud, MD Primary Care Physician:  Kandyce Rud, MD Primary Gastroenterologist:  Dr. Arlyss Repress Reason for Consultation:  Hospital f/u, recent upper GI bleed         HPI:   Marvin Ortega is a 68 y.o. African-American male referred by Dr. Kandyce Rud, MD  for consultation & management of recent hospitalization at [facility] for massive upper GI bleed, 02/15/2018 to 02/19/2018.  Patient presented with hematemesis and melena, acute blood loss anemia, hemorrhagic shock, underwent emergent EGD and was found to have actively bleeding Diuelafoy lesion in the stomach, hemostasis achieved with placement of clips.  Patient was extubated and was discharged home on Protonix.  Patient has history of heavy alcohol use with possible underlying alcoholic liver disease.  There was no evidence of varices or portal hypertensive gastropathy on recent EGD  Interval summary Patient is accompanied by his spouse today.  He denies any symptoms today.  He denies recurrent episodes of melena, hematemesis since discharge.  He reports that he does not drink alcohol at least at home.  He denies abdominal pain, nausea or vomiting.  He reports having regular bowel movements.  He denies fatigue, shortness of breath, chest pain.  He is taking Protonix daily  Follow-up visit 04/27/2018 He is no longer experiencing melena, upper abdominal pain.  He reports seeing blood in the urine, currently being treated for possible UTI.  He continues to take Protonix 40 mg twice daily.  He admits to not drinking alcohol.  He is accompanied by his wife today.  His labs from last visit showed improvement in his anemia  NSAIDs: None  Antiplts/Anticoagulants/Anti thrombotics: None  GI Procedures:  EGD 02/15/18 - Normal duodenal bulb and second  portion of the duodenum. - Hematin (altered blood/coffee-ground-like material) in the entire stomach. - Dieulafoy lesion of stomach. - Small hiatal hernia. - LA Grade D erosive esophagitis. - No specimens collected.  Past Medical History:  Diagnosis Date  . BPH (benign prostatic hyperplasia)   . GERD (gastroesophageal reflux disease)   . Hemorrhagic shock (HCC)   . Hypertension     Past Surgical History:  Procedure Laterality Date  . ESOPHAGOGASTRODUODENOSCOPY N/A 02/15/2018   Procedure: ESOPHAGOGASTRODUODENOSCOPY (EGD);  Surgeon: Toney Reil, MD;  Location: [facility];  Service: Gastroenterology;  Laterality: N/A;    Current Outpatient Medications:  .  ciprofloxacin (CIPRO) 500 MG tablet, Take 500 mg by mouth 2 (two) times daily. for 10 days, Disp: , Rfl:  .  finasteride (PROSCAR) 5 MG tablet, Take 5 mg by mouth daily., Disp: , Rfl:  .  metoprolol succinate (TOPROL-XL) 50 MG 24 hr tablet, Take 100 mg by mouth daily., Disp: , Rfl:  .  pantoprazole (PROTONIX) 40 MG tablet, Take 1 tablet (40 mg total) by mouth 2 (two) times daily., Disp: 60 tablet, Rfl: 0 .  silodosin (RAPAFLO) 8 MG CAPS capsule, Take 8 mg by mouth daily., Disp: , Rfl: 11 .  Thiamine HCl (VITAMIN B-1) 250 MG tablet, Take 250 mg by mouth daily., Disp: , Rfl:  .  famotidine (PEPCID) 20 MG tablet, Take 20 mg by mouth 2 (two) times daily., Disp: , Rfl: 3   Family History  Problem Relation Age of Onset  . Heart disease Father   . Aneurysm Sister  Social History   Tobacco Use  . Smoking status: Never Smoker  . Smokeless tobacco: Never Used  Substance Use Topics  . Alcohol use: Yes  . Drug use: Not on file    Allergies as of 04/27/2018  . (No Known Allergies)    Review of Systems:    All systems reviewed and negative except where noted in HPI.   Physical Exam:  BP 124/79 (BP Location: Left Arm, Patient Position: Sitting, Cuff Size: Normal)   Pulse 71   Resp 17   Ht 5\' 6"  (1.676 m)   Wt  147 lb 9.6 oz (67 kg)   BMI 23.82 kg/m  No LMP for male patient.  General:   Alert,  Well-developed, well-nourished, pleasant and cooperative in NAD Head:  Normocephalic and atraumatic. Eyes:  Sclera clear, no icterus.   Conjunctiva pink. Ears:  Normal auditory acuity. Nose:  No deformity, discharge, or lesions. Mouth:  No deformity or lesions,oropharynx pink & moist. Neck:  Supple; no masses or thyromegaly. Lungs:  Respirations even and unlabored.  Clear throughout to auscultation.   No wheezes, crackles, or rhonchi. No acute distress. Heart:  Regular rate and rhythm; no murmurs, clicks, rubs, or gallops. Abdomen:  Normal bowel sounds. Soft, non-tender and non-distended without masses, hepatosplenomegaly or hernias noted.  No guarding or rebound tenderness.   Rectal: Not performed Msk:  Symmetrical without gross deformities. Good, equal movement & strength bilaterally. Pulses:  Normal pulses noted. Extremities:  No clubbing or edema.  No cyanosis. Neurologic:  Alert and oriented x3;  grossly normal neurologically. Skin:  Intact without significant lesions or rashes. No jaundice. Psych:  Alert and cooperative. Normal mood and affect.  Imaging Studies: Reviewed  Assessment and Plan:   Marvin Ortega is a 68 y.o. African-American male with history of heavy alcohol use, with recent massive upper GI bleed secondary to Dieulafoy lesion in the stomach, hemostasis achieved with placement of hemoclips Patient is currently asymptomatic, with no signs or symptoms to suggest recurrent GI bleed He also has severe erosive esophagitis  Recent upper GI bleed: Currently resolved Anemia is improving iron studies, B12 and folate levels are normal  LA grade D esophagitis Continue Protonix 40 mg twice daily at least for 2 more months Recommend EGD in 2 months to confirm healing of the esophagitis and rule out underlying Barrett's  History of alcohol use: Currently sober Patient developed  thrombocytopenia during the hospital stay most likely secondary to active alcohol use Ultrasound right upper quadrant did not reveal cirrhosis, patent portal vein Thrombocytopenia resolved, LFTs significantly improved viral hepatitis panel negative Maintain abstinence from alcohol  Colon cancer screening Patient had a colonoscopy in 03/2014 at [facility] Colonoscopy and pathology results obtained, scanned into his chart Bowel prep excellent Recommend surveillance colonoscopy in 03/2019  Follow-up with PCP   Arlyss Repress, MD

## 2018-06-08 ENCOUNTER — Telehealth: Payer: Self-pay

## 2018-06-08 NOTE — Telephone Encounter (Signed)
Contacted patient to make him aware that his EGD scheduled to 06/24/18 had been canceled due to COVID 19 and offered to reschedule it for him in June/July.  Patient states he did not want to reschedule and will call the office if he needs anything.  Thanks [employer]

## 2018-06-24 ENCOUNTER — Encounter: Admission: RE | Payer: Self-pay | Source: Home / Self Care

## 2018-06-24 ENCOUNTER — Ambulatory Visit: Admission: RE | Admit: 2018-06-24 | Payer: Medicare HMO | Source: Home / Self Care | Admitting: Gastroenterology

## 2018-06-24 SURGERY — ESOPHAGOGASTRODUODENOSCOPY (EGD) WITH PROPOFOL
Anesthesia: General

## 2018-06-28 ENCOUNTER — Ambulatory Visit: Payer: Self-pay | Admitting: Urology

## 2018-08-12 ENCOUNTER — Telehealth: Payer: Self-pay | Admitting: Urology

## 2018-09-28 ENCOUNTER — Encounter: Payer: Self-pay | Admitting: Urology

## 2018-09-28 ENCOUNTER — Other Ambulatory Visit: Payer: Self-pay

## 2018-09-28 ENCOUNTER — Ambulatory Visit (INDEPENDENT_AMBULATORY_CARE_PROVIDER_SITE_OTHER): Payer: Medicare HMO | Admitting: Urology

## 2018-09-28 VITALS — BP 127/79 | HR 81 | Ht 66.0 in | Wt 151.1 lb

## 2018-09-28 DIAGNOSIS — R310 Gross hematuria: Secondary | ICD-10-CM | POA: Insufficient documentation

## 2018-09-28 DIAGNOSIS — N401 Enlarged prostate with lower urinary tract symptoms: Secondary | ICD-10-CM | POA: Diagnosis not present

## 2018-09-28 LAB — URINALYSIS, COMPLETE
Bilirubin, UA: NEGATIVE
Glucose, UA: NEGATIVE
Ketones, UA: NEGATIVE
Nitrite, UA: NEGATIVE
Protein,UA: NEGATIVE
RBC, UA: NEGATIVE
Specific Gravity, UA: 1.02 (ref 1.005–1.030)
Urobilinogen, Ur: 0.2 mg/dL (ref 0.2–1.0)
pH, UA: 5 (ref 5.0–7.5)

## 2018-09-28 LAB — MICROSCOPIC EXAMINATION
Bacteria, UA: NONE SEEN
RBC, Urine: NONE SEEN /hpf (ref 0–2)

## 2018-09-28 NOTE — Progress Notes (Signed)
09/28/2018 9:11 AM   Marvin Ortega 04-20-1950 [id number]  Referring provider: Kandyce RudBabaoff, [name], MD [phone] [facility] [place] [facility] [place],  [place] [id number]  Chief Complaint  Patient presents with  . Hematuria    HPI: Marvin Ortega is a 68 year-old male seen at the request of Dr. Larwance SachsBabaoff for evaluation of hematuria.  Today he denies gross hematuria however chart review remarkable for a visit with his PCP on 04/23/2018 with an episode of total gross painless hematuria beginning on 04/22/2018.  Urinalysis at that visit remarkable for dark red urine with >50 RBCs.  A repeat urinalysis on 05/20/2018 showed moderate blood on dipstick and 10-50 RBCs.  Urine culture on 04/23/2018 was negative.  A PSA performed on 05/17/2018 was 0.57 (uncorrected).  He denies prior urologic evaluation however is on finasteride and tamsulosin for BPH.  He denies bothersome lower urinary tract symptoms.  He denies flank, abdominal, pelvic or scrotal pain.  PMH: Past Medical History:  Diagnosis Date  . BPH (benign prostatic hyperplasia)   . Dieulafoy lesion of stomach   . GERD (gastroesophageal reflux disease)   . GIB (gastrointestinal bleeding) 02/15/2018  . Hemorrhagic shock (HCC)   . Hypertension   . Upper GI bleed     Surgical History: Past Surgical History:  Procedure Laterality Date  . ESOPHAGOGASTRODUODENOSCOPY N/A 02/15/2018   Procedure: ESOPHAGOGASTRODUODENOSCOPY (EGD);  Surgeon: Toney ReilVanga, Rohini Reddy, MD;  [facility];  Service: Gastroenterology;  Laterality: N/A;    Home Medications:  Allergies as of 09/28/2018   No Known Allergies     Medication List       Accurate as of September 28, 2018  9:11 AM. If you have any questions, ask your nurse or doctor.        STOP taking these medications   ciprofloxacin 500 MG tablet Commonly known as: CIPRO Stopped by: Riki AltesScott C Abriel Geesey, MD     TAKE these medications   famotidine 20 MG tablet Commonly  known as: PEPCID Take 20 mg by mouth 2 (two) times daily.   finasteride 5 MG tablet Commonly known as: PROSCAR Take 5 mg by mouth daily.   furosemide 40 MG tablet Commonly known as: LASIX TAKE 1 TABLET BY MOUTH EVERY DAY   metoprolol succinate 50 MG 24 hr tablet Commonly known as: TOPROL-XL Take 100 mg by mouth daily.   pantoprazole 40 MG tablet Commonly known as: PROTONIX Take 1 tablet (40 mg total) by mouth 2 (two) times daily.   Potassium Chloride ER 20 MEQ Tbcr TAKE 1 TABLET BY MOUTH TWICE A DAY   silodosin 8 MG Caps capsule Commonly known as: RAPAFLO Take 8 mg by mouth daily.   tamsulosin 0.4 MG Caps capsule Commonly known as: FLOMAX Take by mouth.   vitamin B-1 250 MG tablet Take 250 mg by mouth daily.       Allergies: No Known Allergies  Family History: Family History  Problem Relation Age of Onset  . Heart disease Father   . Aneurysm Sister     Social History:  reports that he has never smoked. He has never used smokeless tobacco. He reports current alcohol use. He reports that he does not use drugs.  ROS: UROLOGY Frequent Urination?: No Hard to postpone urination?: No Burning/pain with urination?: No Get up at night to urinate?: Yes Leakage of urine?: No Urine stream starts and stops?: No Trouble starting stream?: No Do you have to strain to urinate?: No Blood in  urine?: No Urinary tract infection?: No Sexually transmitted disease?: No Injury to kidneys or bladder?: No Painful intercourse?: No Weak stream?: No Erection problems?: No Penile pain?: No  Gastrointestinal Nausea?: No Vomiting?: No Indigestion/heartburn?: No Diarrhea?: No Constipation?: No  Constitutional Fever: No Night sweats?: No Weight loss?: No Fatigue?: No  Skin Skin rash/lesions?: No Itching?: No  Eyes Blurred vision?: No Double vision?: No  Ears/Nose/Throat Sore throat?: No Sinus problems?: Yes  Hematologic/Lymphatic Swollen glands?: No Easy  bruising?: No  Cardiovascular Leg swelling?: No Chest pain?: No  Respiratory Cough?: No Shortness of breath?: Yes  Endocrine Excessive thirst?: No  Musculoskeletal Back pain?: No Joint pain?: No  Neurological Headaches?: No Dizziness?: No  Psychologic Depression?: No Anxiety?: No  Physical Exam: BP 127/79 (BP Location: Left Arm, Patient Position: Sitting, Cuff Size: Normal)   Pulse 81   Ht 5\' 6"  (1.676 m)   Wt 151 lb 1.6 oz (68.5 kg)   BMI 24.39 kg/m   Constitutional:  Alert and oriented, No acute distress. HEENT: [place] AT, moist mucus membranes.  Trachea midline, no masses. Cardiovascular: No clubbing, cyanosis, or edema. Respiratory: Normal respiratory effort, no increased work of breathing. GI: Abdomen is soft, nontender, nondistended, no abdominal masses GU: No CVA tenderness, phallus uncircumcised without lesions, testes descended bilaterally without masses or tenderness, cord/epididymis palpably normal bilaterally.  Prostate-lower one half smooth without nodules.  Unable to palpate upper half a gland secondary to patient discomfort and movement. Skin: No rashes, bruises or suspicious lesions. Neurologic: Grossly intact, no focal deficits, moving all 4 extremities. Psychiatric: Normal mood and affect.  Laboratory Data:  Urinalysis Dipstick trace leukocytes Microscopy 6-10 WBC, no RBC  Assessment & Plan:   68 year old male with a history of gross hematuria February 2020 and significant microscopic hematuria 1 month later.  We discussed potential etiologies of gross hematuria including both benign and malignant conditions.  I recommended further evaluation with CT urogram and cystoscopy.  The procedures were discussed in detail.  All questions were answered and he elected to schedule.   Abbie Sons, [place] [address], [place], [place] [address] [phone]

## 2018-10-07 ENCOUNTER — Other Ambulatory Visit: Payer: Self-pay

## 2018-10-07 ENCOUNTER — Ambulatory Visit
Admission: RE | Admit: 2018-10-07 | Discharge: 2018-10-07 | Disposition: A | Discharge status: Home / Self Care | Payer: Medicare HMO | Source: Ambulatory Visit | Attending: Urology | Admitting: Urology

## 2018-10-07 DIAGNOSIS — R310 Gross hematuria: Secondary | ICD-10-CM | POA: Diagnosis not present

## 2018-10-07 LAB — POCT I-STAT CREATININE: Creatinine, Ser: 1.2 mg/dL (ref 0.61–1.24)

## 2018-10-07 MED ORDER — IOHEXOL 300 MG/ML  SOLN
125.0000 mL | Freq: Once | INTRAMUSCULAR | Status: AC | PRN
  Administered 2018-10-07: 125 mL via INTRAVENOUS

## 2018-10-29 ENCOUNTER — Encounter: Payer: Self-pay | Admitting: Urology

## 2018-10-29 ENCOUNTER — Ambulatory Visit (INDEPENDENT_AMBULATORY_CARE_PROVIDER_SITE_OTHER): Payer: Medicare HMO | Admitting: Urology

## 2018-10-29 ENCOUNTER — Other Ambulatory Visit: Payer: Self-pay

## 2018-10-29 VITALS — BP 125/84 | HR 80 | Ht 66.0 in | Wt 151.0 lb

## 2018-10-29 DIAGNOSIS — N210 Calculus in bladder: Secondary | ICD-10-CM

## 2018-10-29 DIAGNOSIS — R310 Gross hematuria: Secondary | ICD-10-CM

## 2018-10-29 DIAGNOSIS — N200 Calculus of kidney: Secondary | ICD-10-CM | POA: Insufficient documentation

## 2018-10-29 DIAGNOSIS — N201 Calculus of ureter: Secondary | ICD-10-CM | POA: Diagnosis not present

## 2018-10-29 LAB — URINALYSIS, COMPLETE
Bilirubin, UA: NEGATIVE
Glucose, UA: NEGATIVE
Ketones, UA: NEGATIVE
Leukocytes,UA: NEGATIVE
Nitrite, UA: NEGATIVE
Protein,UA: NEGATIVE
Specific Gravity, UA: 1.02 (ref 1.005–1.030)
Urobilinogen, Ur: 0.2 mg/dL (ref 0.2–1.0)
pH, UA: 5 (ref 5.0–7.5)

## 2018-10-29 LAB — MICROSCOPIC EXAMINATION: Bacteria, UA: NONE SEEN

## 2018-10-29 NOTE — H&P (View-Only) (Signed)
10/29/2018 10:08 AM   Karmen BongoWallace E Leiphart 07/05/1950 [id number]  Referring provider: Kandyce RudBabaoff, [name], MD [phone] [address] [facility] [place] [facility] [place],  [place] [id number]  Chief Complaint  Patient presents with  . Cysto    HPI: 68 y.o. male initially seen 09/28/2018 for gross hematuria and clinically significant microscopic hematuria.  CT urogram and cystoscopy was recommended.  He was scheduled today for CT however requested his CT report prior to cystoscopy.  His CT did show a large jack stone bladder calculus measuring 4.9 cm.  He also had 4 and 5 mm distal right ureteral calculi without obstruction.  There was a 2.1 cm lower pole partial staghorn calculus in the left kidney and 2 small nonobstructing bilateral renal calculi   PMH: Past Medical History:  Diagnosis Date  . BPH (benign prostatic hyperplasia)   . Dieulafoy lesion of stomach   . GERD (gastroesophageal reflux disease)   . GIB (gastrointestinal bleeding) 02/15/2018  . Hemorrhagic shock (HCC)   . Hypertension   . Upper GI bleed     Surgical History: Past Surgical History:  Procedure Laterality Date  . ESOPHAGOGASTRODUODENOSCOPY N/A 02/15/2018   Procedure: ESOPHAGOGASTRODUODENOSCOPY (EGD);  Surgeon: Toney ReilVanga, Rohini Reddy, MD;  [facility];  Service: Gastroenterology;  Laterality: N/A;    Home Medications:  Allergies as of 10/29/2018   No Known Allergies     Medication List       Accurate as of October 29, 2018 10:08 AM. If you have any questions, ask your nurse or doctor.        famotidine 20 MG tablet Commonly known as: PEPCID Take 20 mg by mouth 2 (two) times daily.   finasteride 5 MG tablet Commonly known as: PROSCAR Take 5 mg by mouth daily.   furosemide 40 MG tablet Commonly known as: LASIX TAKE 1 TABLET BY MOUTH EVERY DAY   metoprolol succinate 50 MG 24 hr tablet Commonly known as: TOPROL-XL Take 100 mg by mouth daily.   pantoprazole 40 MG tablet  Commonly known as: PROTONIX Take 1 tablet (40 mg total) by mouth 2 (two) times daily.   Potassium Chloride ER 20 MEQ Tbcr TAKE 1 TABLET BY MOUTH TWICE A DAY   silodosin 8 MG Caps capsule Commonly known as: RAPAFLO Take 8 mg by mouth daily.   tamsulosin 0.4 MG Caps capsule Commonly known as: FLOMAX Take by mouth.   vitamin B-1 250 MG tablet Take 250 mg by mouth daily.       Allergies: No Known Allergies  Family History: Family History  Problem Relation Age of Onset  . Heart disease Father   . Aneurysm Sister     Social History:  reports that he has never smoked. He has never used smokeless tobacco. He reports current alcohol use. He reports that he does not use drugs.  ROS: No significant change from 09/28/2018  Physical Exam: BP 125/84 (BP Location: Left Arm, Patient Position: Sitting, Cuff Size: Normal)   Pulse 80   Ht 5\' 6"  (1.676 m)   Wt 151 lb (68.5 kg)   BMI 24.37 kg/m   Constitutional:  Alert and oriented, No acute distress. HEENT:  AT, moist mucus membranes.  Trachea midline, no masses. Cardiovascular: No clubbing, cyanosis, or edema.  RRR Respiratory: Normal respiratory effort, no increased work of breathing.  Clear GI: Abdomen is soft, nontender, nondistended, no abdominal masses GU: No CVA tenderness Lymph: No cervical or inguinal lymphadenopathy. Skin: No rashes, bruises or suspicious  lesions. Neurologic: Grossly intact, no focal deficits, moving all 4 extremities. Psychiatric: Normal mood and affect.  Laboratory Data:  Urinalysis Dipstick 2+ blood Microscopy 3-10 RBC  Pertinent Imaging: CT personally reviewed  Results for orders placed during the hospital encounter of 10/07/18  CT HEMATURIA WORKUP   Narrative CLINICAL DATA:  Patient with gross hematuria.  EXAM: CT ABDOMEN AND PELVIS WITHOUT AND WITH CONTRAST  TECHNIQUE: Multidetector CT imaging of the abdomen and pelvis was performed following the standard protocol before and  following the bolus administration of intravenous contrast.  CONTRAST:  145mL OMNIPAQUE IOHEXOL 300 MG/ML  SOLN  COMPARISON:  None.  FINDINGS: Lower chest: Heart is mildly enlarged. Emphysematous changes within the lower lungs bilaterally. No pleural effusion.  Hepatobiliary: The liver is mildly nodular in contour. No focal hepatic lesions identified. Multiple stones within the gallbladder lumen. No intrahepatic or extrahepatic biliary ductal dilatation.  Pancreas: Unremarkable  Spleen: Unremarkable  Adrenals/Urinary Tract: Normal adrenal glands. There is a 2.1 cm stone within the inferior pole of the left kidney. There is a 3 mm stone within the superior pole of the left kidney. There is a 2 mm stone within the superior pole of the right kidney. There are 2 adjacent stones within the distal right ureter measuring 5 mm and 4 mm respectively. There is a 1.4 cm cyst within the interpolar region of the right kidney. There is a 1.6 cm exophytic cyst inferior pole left kidney. No suspicious enhancing renal masses. Delayed images demonstrate excreted contrast material the bilateral renal collecting systems and ureters. No filling defects identified within the opacified portions. There is a large jack stone calculus within the urinary bladder measuring up to 4.9 cm.  Stomach/Bowel: Sigmoid colonic diverticulosis. No CT evidence for acute diverticulitis. Normal appendix. No evidence for small bowel obstruction. No free fluid or free intraperitoneal air. Small hiatal hernia. Normal morphology of the stomach.  Vascular/Lymphatic: Normal caliber abdominal aorta. Peripheral calcified atherosclerotic plaque. No retroperitoneal lymphadenopathy. There is a 7 mm paraesophageal lymph node (image 9; series 9)  Reproductive: Heterogeneous prostate.  Other: None.  Musculoskeletal: Lower thoracic and lumbar spine degenerative changes. Healing posterior right lower rib fracture   IMPRESSION: 1. There are 2 adjacent stones within the distal right ureter measuring 5 mm and 4 mm. 2. Large jack stone calculus (4.9 cm) within the urinary bladder. 3. Bilateral nephrolithiasis with the larger stone measuring 2.1 cm inferior pole left kidney. 4. Nodular hepatic contour suggestive of cirrhosis. 5. Paraesophageal varices. There is circumferential wall thickening of the distal esophagus, nonspecific. Recommend further evaluation with upper endoscopy given the adjacent periesophageal lymph node to exclude the possibility of esophageal lesion/mass. 6. Cholelithiasis. 7. These results will be called to the ordering clinician or representative by the Radiologist Assistant, and communication documented in the PACS or zVision Dashboard.   Electronically Signed   By: Lovey Newcomer M.D.   On: 10/07/2018 14:11     Assessment & Plan:    - Bladder calculus Based on CT findings cystoscopy was not performed today and will be performed under anesthesia.  He was informed of the large bladder calculus and the recommendation for removal.  I have recommended scheduling cystolitholapaxy.  He has no bothersome lower urinary tract symptoms and declines an outlet procedure.  He would like to discuss this with his family.  The procedure was discussed in detail including potential risks of bleeding and infection.  - Right ureteral calculi He has 2 small distal right ureteral calculi which may  pass.  No significant obstruction or pain.  If he does not pass the calculi would recommend ureteroscopic removal at the time of his bladder stone surgery.  - Nephrolithiasis Partial lower pole left staghorn.  Will address options at a future visit since above calculi will need to be addressed initially.   Le Ferraz C Kemaya Dorner, MD  [facility] [address], [address] [place], [place] [address] [phone]  

## 2018-10-29 NOTE — Progress Notes (Signed)
10/29/2018 10:08 AM   Marvin Ortega 07/05/1950 [id number]  Referring provider: Kandyce RudBabaoff, [name], MD [phone] [address] [facility] [place] [facility] [place],  [place] [id number]  Chief Complaint  Patient presents with  . Cysto    HPI: 68 y.o. male initially seen 09/28/2018 for gross hematuria and clinically significant microscopic hematuria.  CT urogram and cystoscopy was recommended.  He was scheduled today for CT however requested his CT report prior to cystoscopy.  His CT did show a large jack stone bladder calculus measuring 4.9 cm.  He also had 4 and 5 mm distal right ureteral calculi without obstruction.  There was a 2.1 cm lower pole partial staghorn calculus in the left kidney and 2 small nonobstructing bilateral renal calculi   PMH: Past Medical History:  Diagnosis Date  . BPH (benign prostatic hyperplasia)   . Dieulafoy lesion of stomach   . GERD (gastroesophageal reflux disease)   . GIB (gastrointestinal bleeding) 02/15/2018  . Hemorrhagic shock (HCC)   . Hypertension   . Upper GI bleed     Surgical History: Past Surgical History:  Procedure Laterality Date  . ESOPHAGOGASTRODUODENOSCOPY N/A 02/15/2018   Procedure: ESOPHAGOGASTRODUODENOSCOPY (EGD);  Surgeon: Toney ReilVanga, Rohini Reddy, MD;  [facility];  Service: Gastroenterology;  Laterality: N/A;    Home Medications:  Allergies as of 10/29/2018   No Known Allergies     Medication List       Accurate as of October 29, 2018 10:08 AM. If you have any questions, ask your nurse or doctor.        famotidine 20 MG tablet Commonly known as: PEPCID Take 20 mg by mouth 2 (two) times daily.   finasteride 5 MG tablet Commonly known as: PROSCAR Take 5 mg by mouth daily.   furosemide 40 MG tablet Commonly known as: LASIX TAKE 1 TABLET BY MOUTH EVERY DAY   metoprolol succinate 50 MG 24 hr tablet Commonly known as: TOPROL-XL Take 100 mg by mouth daily.   pantoprazole 40 MG tablet  Commonly known as: PROTONIX Take 1 tablet (40 mg total) by mouth 2 (two) times daily.   Potassium Chloride ER 20 MEQ Tbcr TAKE 1 TABLET BY MOUTH TWICE A DAY   silodosin 8 MG Caps capsule Commonly known as: RAPAFLO Take 8 mg by mouth daily.   tamsulosin 0.4 MG Caps capsule Commonly known as: FLOMAX Take by mouth.   vitamin B-1 250 MG tablet Take 250 mg by mouth daily.       Allergies: No Known Allergies  Family History: Family History  Problem Relation Age of Onset  . Heart disease Father   . Aneurysm Sister     Social History:  reports that he has never smoked. He has never used smokeless tobacco. He reports current alcohol use. He reports that he does not use drugs.  ROS: No significant change from 09/28/2018  Physical Exam: BP 125/84 (BP Location: Left Arm, Patient Position: Sitting, Cuff Size: Normal)   Pulse 80   Ht 5\' 6"  (1.676 m)   Wt 151 lb (68.5 kg)   BMI 24.37 kg/m   Constitutional:  Alert and oriented, No acute distress. HEENT:  AT, moist mucus membranes.  Trachea midline, no masses. Cardiovascular: No clubbing, cyanosis, or edema.  RRR Respiratory: Normal respiratory effort, no increased work of breathing.  Clear GI: Abdomen is soft, nontender, nondistended, no abdominal masses GU: No CVA tenderness Lymph: No cervical or inguinal lymphadenopathy. Skin: No rashes, bruises or suspicious  lesions. Neurologic: Grossly intact, no focal deficits, moving all 4 extremities. Psychiatric: Normal mood and affect.  Laboratory Data:  Urinalysis Dipstick 2+ blood Microscopy 3-10 RBC  Pertinent Imaging: CT personally reviewed  Results for orders placed during the hospital encounter of 10/07/18  CT HEMATURIA WORKUP   Narrative CLINICAL DATA:  Patient with gross hematuria.  EXAM: CT ABDOMEN AND PELVIS WITHOUT AND WITH CONTRAST  TECHNIQUE: Multidetector CT imaging of the abdomen and pelvis was performed following the standard protocol before and  following the bolus administration of intravenous contrast.  CONTRAST:  145mL OMNIPAQUE IOHEXOL 300 MG/ML  SOLN  COMPARISON:  None.  FINDINGS: Lower chest: Heart is mildly enlarged. Emphysematous changes within the lower lungs bilaterally. No pleural effusion.  Hepatobiliary: The liver is mildly nodular in contour. No focal hepatic lesions identified. Multiple stones within the gallbladder lumen. No intrahepatic or extrahepatic biliary ductal dilatation.  Pancreas: Unremarkable  Spleen: Unremarkable  Adrenals/Urinary Tract: Normal adrenal glands. There is a 2.1 cm stone within the inferior pole of the left kidney. There is a 3 mm stone within the superior pole of the left kidney. There is a 2 mm stone within the superior pole of the right kidney. There are 2 adjacent stones within the distal right ureter measuring 5 mm and 4 mm respectively. There is a 1.4 cm cyst within the interpolar region of the right kidney. There is a 1.6 cm exophytic cyst inferior pole left kidney. No suspicious enhancing renal masses. Delayed images demonstrate excreted contrast material the bilateral renal collecting systems and ureters. No filling defects identified within the opacified portions. There is a large jack stone calculus within the urinary bladder measuring up to 4.9 cm.  Stomach/Bowel: Sigmoid colonic diverticulosis. No CT evidence for acute diverticulitis. Normal appendix. No evidence for small bowel obstruction. No free fluid or free intraperitoneal air. Small hiatal hernia. Normal morphology of the stomach.  Vascular/Lymphatic: Normal caliber abdominal aorta. Peripheral calcified atherosclerotic plaque. No retroperitoneal lymphadenopathy. There is a 7 mm paraesophageal lymph node (image 9; series 9)  Reproductive: Heterogeneous prostate.  Other: None.  Musculoskeletal: Lower thoracic and lumbar spine degenerative changes. Healing posterior right lower rib fracture   IMPRESSION: 1. There are 2 adjacent stones within the distal right ureter measuring 5 mm and 4 mm. 2. Large jack stone calculus (4.9 cm) within the urinary bladder. 3. Bilateral nephrolithiasis with the larger stone measuring 2.1 cm inferior pole left kidney. 4. Nodular hepatic contour suggestive of cirrhosis. 5. Paraesophageal varices. There is circumferential wall thickening of the distal esophagus, nonspecific. Recommend further evaluation with upper endoscopy given the adjacent periesophageal lymph node to exclude the possibility of esophageal lesion/mass. 6. Cholelithiasis. 7. These results will be called to the ordering clinician or representative by the Radiologist Assistant, and communication documented in the PACS or zVision Dashboard.   Electronically Signed   By: Lovey Newcomer M.D.   On: 10/07/2018 14:11     Assessment & Plan:    - Bladder calculus Based on CT findings cystoscopy was not performed today and will be performed under anesthesia.  He was informed of the large bladder calculus and the recommendation for removal.  I have recommended scheduling cystolitholapaxy.  He has no bothersome lower urinary tract symptoms and declines an outlet procedure.  He would like to discuss this with his family.  The procedure was discussed in detail including potential risks of bleeding and infection.  - Right ureteral calculi He has 2 small distal right ureteral calculi which may  pass.  No significant obstruction or pain.  If he does not pass the calculi would recommend ureteroscopic removal at the time of his bladder stone surgery.  - Nephrolithiasis Partial lower pole left staghorn.  Will address options at a future visit since above calculi will need to be addressed initially.   Riki AltesScott C Stoioff, MD  [facility] [address], [address] [place], [place] [address] [phone]

## 2018-11-04 ENCOUNTER — Telehealth: Payer: Self-pay | Admitting: Urology

## 2018-11-04 NOTE — Telephone Encounter (Signed)
Pt's sister called and would like a call back. (She is on his DPR) Pt has told her that surgery was discussed at his last visit but he was unsure what kind of surgery it was.

## 2018-11-09 ENCOUNTER — Other Ambulatory Visit: Payer: Self-pay | Admitting: Radiology

## 2018-11-09 DIAGNOSIS — N201 Calculus of ureter: Secondary | ICD-10-CM

## 2018-11-09 DIAGNOSIS — N210 Calculus in bladder: Secondary | ICD-10-CM

## 2018-11-12 ENCOUNTER — Other Ambulatory Visit: Payer: Self-pay

## 2018-11-12 ENCOUNTER — Encounter
Admission: RE | Admit: 2018-11-12 | Discharge: 2018-11-12 | Disposition: A | Discharge status: Home / Self Care | Payer: Medicare HMO | Source: Ambulatory Visit | Attending: Urology | Admitting: Urology

## 2018-11-12 DIAGNOSIS — I10 Essential (primary) hypertension: Secondary | ICD-10-CM | POA: Diagnosis not present

## 2018-11-12 DIAGNOSIS — Z01812 Encounter for preprocedural laboratory examination: Secondary | ICD-10-CM | POA: Diagnosis not present

## 2018-11-12 HISTORY — DX: Personal history of urinary calculi: Z87.442

## 2018-11-12 HISTORY — DX: Prediabetes: R73.03

## 2018-11-12 NOTE — Pre-Procedure Instructions (Signed)
Echo complete5/03/2018 [place] Component Name Value Ref Range  LV Ejection Fraction (%) 55   Aortic Valve Stenosis Grade none   Aortic Valve Regurgitation Grade mild   Aortic Valve Max Velocity (m/s) 1.4 m/sec  Mitral Valve Stenosis Grade none   Mitral Valve Regurgitation Grade mild   Tricuspid Valve Regurgitation Grade mild   Tricuspid Valve Regurgitation Max Velocity (m/s) 2.5 m/sec  Right Ventricle Systolic Pressure (mmHg) 50.0 mmHg  LV End Diastolic Diameter (cm) 4.3 cm  LV End Systolic Diameter (cm) 3.2 cm  LV Septum Wall Thickness (cm) 1.4 cm  LV Posterior Wall Thickness (cm) 1.2 cm  Left Atrium Diameter (cm) 3.7 cm  Result Narrative             CARDIOLOGY DEPARTMENT          Marcelli, McCone                  [id number]      [place] #: [id number]      [place], [place], [place] [address]    Date: 07/09/2018 10: 31 AM                                Adult  Male  Age: 68 yrs      ECHOCARDIOGRAM REPORT               Outpatient                                [id number][facility]    STUDY:CHEST WALL        TAPE:          MD1: BABAOFF, [name] ELIJAH    ECHO:Yes  DOPPLER:Yes    FILE:          BP: 124/79 mmHg    COLOR:Yes  CONTRAST:No   MACHINE:Philips  RV BIOPSY:No     3D:No SOUND QLTY:Moderate      Height: 64 in   MEDIUM:None                       Weight: 155 lb                                BSA: 1.8 m2 _________________________________________________________________________________________        HISTORY: DOE         REASON: Assess, LV function       INDICATION: Acute congestive heart failure, unspecified heart failure type  ( _________________________________________________________________________________________ ECHOCARDIOGRAPHIC MEASUREMENTS 2D DIMENSIONS AORTA         Values  Normal Range  MAIN PA     Values  Normal Range        Annulus: 2.2 cm    [2.3-2.9]     PA Main: nm*    [1.5-2.1]       Aorta Sin: 3.5 cm    [3.1-3.7]  RIGHT VENTRICLE      ST Junction: nm*     [2.6-3.2]     RV Base: nm*    [<4.2]       Asc.Aorta: nm*     [2.6-3.4]     RV Mid: 2.8 cm  [<  3.5] LEFT VENTRICLE                   RV Length: nm*    [<8.6]         LVIDd: 4.3 cm    [4.2-5.9]  INFERIOR VENA CAVA         LVIDs: 3.2 cm            Max. IVC: nm*    [<=2.1]           FS: 27.0 %    [>25]      Min. IVC: nm*          SWT: 1.4 cm    [0.6-1.0]  ------------------          PWT: 1.2 cm    [0.6-1.0]  nm* - not measured LEFT ATRIUM        LA Diam: 3.7 cm    [3.0-4.0]      LA A4C Area: nm*     [<20]       LA Volume: nm*     [18-58] _________________________________________________________________________________________ ECHOCARDIOGRAPHIC DESCRIPTIONS AORTIC ROOT          Size: Normal       Dissection: INDETERM FOR DISSECTION AORTIC VALVE        Leaflets: Tricuspid          Morphology: MILDLY THICKENED        Mobility: Fully mobile LEFT VENTRICLE          Size: Normal            Anterior: Normal      Contraction: Normal             Lateral: Normal       Closest EF: >55% (Estimated)        Septal: Normal       LV Masses: No Masses            Apical: Normal          LVH: MODERATE LVH         Inferior: Normal                           Posterior: Normal      Dias.FxClass: N/A MITRAL  VALVE        Leaflets: Normal            Mobility: Fully mobile       Morphology: Normal LEFT ATRIUM          Size: Normal            LA Masses: No masses       IA Septum: Normal IAS MAIN PA          Size: Normal PULMONIC VALVE       Morphology: Normal            Mobility: Fully mobile RIGHT VENTRICLE       RV Masses: No Masses             Size: Normal       Free Wall: Normal           Contraction: Normal TRICUSPID VALVE        Leaflets: Normal            Mobility: Fully mobile       Morphology: Normal RIGHT ATRIUM          Size: Normal  RA Other: None        RA Mass: No masses PERICARDIUM         Fluid: No effusion INFERIOR VENACAVA          Size: Not seen Not Seen _________________________________________________________________________________________  DOPPLER ECHO and OTHER SPECIAL PROCEDURES         Aortic: MILD AR          No AS             137.2 cm/sec peak vel   7.5 mmHg peak grad         Mitral: MILD MR          No MS             3.8 cm^2 by DOPPLER             MV Inflow E Vel = 69.0 cm/sec   MV Annulus E'Vel = 5.8 cm/sec             E/E'Ratio = 11.9       Tricuspid: MILD TR          No TS             249.8 cm/sec peak TR vel  28.0 mmHg peak RV pressure       Pulmonary: MILD PR          No PS             118.1 cm/sec peak vel   5.6 mmHg peak grad _________________________________________________________________________________________ INTERPRETATION NORMAL LEFT VENTRICULAR SYSTOLIC FUNCTION  WITH MODERATE LVH NORMAL RIGHT VENTRICULAR SYSTOLIC FUNCTION MILD VALVULAR REGURGITATION (See above) NO VALVULAR STENOSIS MILD to MODERATE AR MILD MR, TR,  PR EF 50-55% LVH: MODERATE LVH Closest EF: >55% (Estimated) Aortic: MILD AR Mitral: MILD MR Tricuspid: MILD TR _________________________________________________________________________________________ Electronically signed by      MD Mariel Kansky on 07/09/2018 02: 28 PM      Performed By: Merla Riches, RDCS   Ordering Physician: Kandyce Rud _________________________________________________________________________________________  Other Result Information  Interface, Text Results In - 07/09/2018  2:29 PM EDT                      CARDIOLOGY DEPARTMENT                   REMMY, BEERMAN           [facility]                                    [id number]           [facility]                           Acct #: [id number]           [address] [place], [place] [address]       Date: 07/09/2018 10: 52 AM                                                              Adult   Male   Age: 68 yrs  ECHOCARDIOGRAM REPORT                              Outpatient                                                              [facility]      STUDY:CHEST WALL               TAPE:                    MD1: BABAOFF, [name] ELIJAH       ECHO:Yes    DOPPLER:Yes       FILE:                    BP: 124/79 mmHg      COLOR:Yes   CONTRAST:No     MACHINE:Philips  RV BIOPSY:No          3D:No  SOUND QLTY:Moderate            Height: 64 in     MEDIUM:None                                              Weight: 155 lb                                                              BSA: 1.8 m2 _________________________________________________________________________________________               HISTORY: DOE                REASON: Assess, LV function            INDICATION: Acute congestive heart failure, unspecified heart failure type ( _________________________________________________________________________________________ ECHOCARDIOGRAPHIC MEASUREMENTS 2D DIMENSIONS AORTA                   Values   Normal Range   MAIN PA         Values    Normal Range               Annulus: 2.2 cm       [2.3-2.9]         PA Main: nm*       [1.5-2.1]             Aorta Sin: 3.5 cm       [3.1-3.7]    RIGHT VENTRICLE           ST Junction: nm*          [2.6-3.2]         RV Base: nm*       [<4.2]             Asc.Aorta: nm*          [2.6-3.4]          RV Mid: 2.8 cm    [<3.5] LEFT VENTRICLE  RV Length: nm*       [<8.6]                 LVIDd: 4.3 cm       [4.2-5.9]    INFERIOR VENA CAVA                 LVIDs: 3.2 cm                        Max. IVC: nm*       [<=2.1]                    FS: 27.0 %       [>25]            Min. IVC: nm*                   SWT: 1.4 cm       [0.6-1.0]    ------------------                   PWT: 1.2 cm       [0.6-1.0]    nm* - not measured LEFT ATRIUM               LA Diam: 3.7 cm       [3.0-4.0]           LA A4C Area: nm*          [<20]             LA Volume: nm*          [18-58] _________________________________________________________________________________________ ECHOCARDIOGRAPHIC DESCRIPTIONS AORTIC ROOT                  Size: Normal            Dissection: INDETERM FOR DISSECTION AORTIC VALVE              Leaflets: Tricuspid                   Morphology: MILDLY THICKENED              Mobility: Fully mobile LEFT VENTRICLE                  Size: Normal                        Anterior: Normal           Contraction: Normal                         Lateral: Normal            Closest EF: >55% (Estimated)                Septal: Normal             LV Masses: No Masses                       Apical: Normal                   LVH: MODERATE LVH                  Inferior: Normal  Posterior: Normal          Dias.FxClass: N/A MITRAL VALVE              Leaflets: Normal                        Mobility: Fully mobile            Morphology: Normal LEFT ATRIUM                  Size: Normal                        LA Masses: No masses             IA Septum: Normal IAS MAIN PA                  Size: Normal PULMONIC VALVE            Morphology: Normal                        Mobility: Fully mobile RIGHT VENTRICLE             RV Masses: No Masses                         Size: Normal             Free Wall: Normal                     Contraction: Normal TRICUSPID VALVE              Leaflets: Normal                        Mobility: Fully mobile            Morphology: Normal RIGHT ATRIUM                  Size: Normal                        RA Other: None               RA Mass: No masses PERICARDIUM                 Fluid: No effusion INFERIOR VENACAVA                  Size: Not seen Not Seen _________________________________________________________________________________________  DOPPLER ECHO and OTHER SPECIAL PROCEDURES                Aortic: MILD AR                    No AS                        137.2 cm/sec peak vel      7.5 mmHg peak grad                Mitral: MILD MR                    No MS                        3.8 cm^2 by DOPPLER  MV Inflow E Vel = 69.0 cm/sec     MV Annulus E'Vel = 5.8 cm/sec                        E/E'Ratio = 11.9             Tricuspid: MILD TR                    No TS                        249.8 cm/sec peak TR vel   28.0 mmHg peak RV pressure             Pulmonary: MILD PR                    No PS                        118.1 cm/sec peak vel      5.6 mmHg peak grad _________________________________________________________________________________________ INTERPRETATION NORMAL LEFT VENTRICULAR SYSTOLIC FUNCTION   WITH MODERATE LVH NORMAL RIGHT VENTRICULAR SYSTOLIC FUNCTION MILD VALVULAR REGURGITATION (See above) NO VALVULAR STENOSIS MILD to MODERATE AR MILD MR, TR, PR EF 50-55% LVH: MODERATE LVH Closest EF: >55% (Estimated) Aortic: MILD AR Mitral: MILD MR Tricuspid: MILD  TR _________________________________________________________________________________________ Electronically signed by            MD Mariel Kansky on 07/09/2018 02: 28 PM          Performed By: Merla Riches, RDCS    Ordering Physician: Kandyce Rud _________________________________________________________________________________________  Status Results Details   Encounter Summary  2019 December ECG 12-lead12/11/2017 [facility] Component Name Value Ref Range  Vent Rate (bpm) 131   PR Interval (msec) 132   QRS Interval (msec) 80   QT Interval (msec) 320   QTc (msec) 472   Other Result Information  This result has an attachment that is not available.  Result Narrative  Sinus tachycardia Otherwise normal ECG No previous ECGs available I reviewed and concur with this report. Electronically signed RU:EAVWUJW MD, [name] [phone]) on 03/08/2018 8:12:05 AM

## 2018-11-12 NOTE — Patient Instructions (Signed)
Your procedure is scheduled on: Tuesday 11/23/18 Report to DAY SURGERY DEPARTMENT LOCATED ON 2ND FLOOR MEDICAL MALL ENTRANCE. To find out your arrival time please call [phone] between 1PM - 3PM on Monday 11/22/18.  Remember: Instructions that are not followed completely may result in serious medical risk, up to and including death, or upon the discretion of your surgeon and anesthesiologist your surgery may need to be rescheduled.     _X__ 1. Do not eat food after midnight the night before your procedure.                 No gum chewing or hard candies. You may drink clear liquids up to 2 hours                 before you are scheduled to arrive for your surgery- DO not drink clear                 liquids within 2 hours of the start of your surgery.                 Clear Liquids include:  water, apple juice without pulp, clear carbohydrate                 drink such as Clearfast or Gatorade, Black Coffee or Tea (Do not add                 anything to coffee or tea). Diabetics water only  __X__2.  On the morning of surgery brush your teeth with toothpaste and water, you                 may rinse your mouth with mouthwash if you wish.  Do not swallow any              toothpaste of mouthwash.     _X__ 3.  No Alcohol for 24 hours before or after surgery.   _X__ 4.  Do Not Smoke or use e-cigarettes For 24 Hours Prior to Your Surgery.                 Do not use any chewable tobacco products for at least 6 hours prior to                 surgery.  ____  5.  Bring all medications with you on the day of surgery if instructed.   __X__  6.  Notify your doctor if there is any change in your medical condition      (cold, fever, infections).     Do not wear jewelry, make-up, hairpins, clips or nail polish. Do not wear lotions, powders, or perfumes.  Do not shave 48 hours prior to surgery. Men may shave face and neck. Do not bring valuables to the hospital.    [facility] not [facility]  any belongings or valuables.  Contacts, dentures/partials or body piercings may not be worn into surgery. Bring a case for your contacts, glasses or hearing aids, a denture cup will be supplied. Leave your suitcase in the car. After surgery it may be brought to your room. For patients admitted to the hospital, discharge time is determined by your treatment team.   Patients discharged the day of surgery will not be allowed to drive home.   Please read over the following fact sheets that you were given:   MRSA Information  __X__ Take these medicines the morning of surgery with A SIP OF WATER:  1. finasteride (PROSCAR  2. metoprolol succinate (TOPROL-XL  3. pantoprazole (PROTONIX  4. tamsulosin (FLOMAX  5.  6.  ____ Fleet Enema (as directed)   ____ Use CHG Soap/SAGE wipes as directed  ____ Use inhalers on the day of surgery  ____ Stop metformin/Janumet/Farxiga 2 days prior to surgery    ____ Take 1/2 of usual insulin dose the night before surgery. No insulin the morning          of surgery.   ____ Stop Blood Thinners Coumadin/Plavix/Xarelto/Pleta/Pradaxa/Eliquis/Effient/Aspirin  on   Or contact your Surgeon, Cardiologist or Medical Doctor regarding  ability to stop your blood thinners  __X__ Stop Anti-inflammatories 7 days before surgery such as Advil, Ibuprofen, Motrin,  BC or Goodies Powder, Naprosyn, Naproxen, Aleve, Aspirin    __X__ Stop all herbal supplements, fish oil or vitamin E until after surgery.    ____ Bring C-Pap to the hospital.

## 2018-11-13 LAB — URINE CULTURE: Culture: NO GROWTH

## 2018-11-19 ENCOUNTER — Other Ambulatory Visit: Payer: Self-pay

## 2018-11-19 ENCOUNTER — Other Ambulatory Visit
Admission: RE | Admit: 2018-11-19 | Discharge: 2018-11-19 | Disposition: A | Discharge status: Home / Self Care | Payer: Medicare HMO | Source: Ambulatory Visit | Attending: Urology | Admitting: Urology

## 2018-11-19 DIAGNOSIS — Z20828 Contact with and (suspected) exposure to other viral communicable diseases: Secondary | ICD-10-CM | POA: Diagnosis not present

## 2018-11-19 DIAGNOSIS — Z01812 Encounter for preprocedural laboratory examination: Secondary | ICD-10-CM | POA: Diagnosis present

## 2018-11-20 LAB — SARS CORONAVIRUS 2 (TAT 6-24 HRS): SARS Coronavirus 2: NEGATIVE

## 2018-11-23 ENCOUNTER — Encounter: Payer: Self-pay | Admitting: Certified Registered Nurse Anesthetist

## 2018-11-23 ENCOUNTER — Ambulatory Visit: Payer: Medicare HMO | Admitting: Certified Registered Nurse Anesthetist

## 2018-11-23 ENCOUNTER — Encounter
Admission: AD | Disposition: A | Discharge status: Home Health | Payer: Self-pay | Source: Home / Self Care | Attending: Urology

## 2018-11-23 ENCOUNTER — Inpatient Hospital Stay
Admission: AD | Admit: 2018-11-23 | Discharge: 2018-11-28 | DRG: 660 | Disposition: A | Discharge status: Home Health | Payer: Medicare HMO | Attending: Internal Medicine | Admitting: Internal Medicine

## 2018-11-23 ENCOUNTER — Ambulatory Visit: Payer: Medicare HMO

## 2018-11-23 ENCOUNTER — Other Ambulatory Visit: Payer: Self-pay

## 2018-11-23 DIAGNOSIS — N210 Calculus in bladder: Secondary | ICD-10-CM

## 2018-11-23 DIAGNOSIS — A0472 Enterocolitis due to Clostridium difficile, not specified as recurrent: Secondary | ICD-10-CM | POA: Diagnosis present

## 2018-11-23 DIAGNOSIS — D509 Iron deficiency anemia, unspecified: Secondary | ICD-10-CM | POA: Diagnosis present

## 2018-11-23 DIAGNOSIS — Z8249 Family history of ischemic heart disease and other diseases of the circulatory system: Secondary | ICD-10-CM

## 2018-11-23 DIAGNOSIS — I129 Hypertensive chronic kidney disease with stage 1 through stage 4 chronic kidney disease, or unspecified chronic kidney disease: Secondary | ICD-10-CM | POA: Diagnosis present

## 2018-11-23 DIAGNOSIS — R310 Gross hematuria: Secondary | ICD-10-CM | POA: Diagnosis present

## 2018-11-23 DIAGNOSIS — K219 Gastro-esophageal reflux disease without esophagitis: Secondary | ICD-10-CM | POA: Diagnosis present

## 2018-11-23 DIAGNOSIS — R159 Full incontinence of feces: Secondary | ICD-10-CM | POA: Diagnosis present

## 2018-11-23 DIAGNOSIS — R3129 Other microscopic hematuria: Secondary | ICD-10-CM | POA: Diagnosis present

## 2018-11-23 DIAGNOSIS — N202 Calculus of kidney with calculus of ureter: Secondary | ICD-10-CM | POA: Diagnosis present

## 2018-11-23 DIAGNOSIS — N200 Calculus of kidney: Secondary | ICD-10-CM

## 2018-11-23 DIAGNOSIS — N400 Enlarged prostate without lower urinary tract symptoms: Secondary | ICD-10-CM | POA: Diagnosis present

## 2018-11-23 DIAGNOSIS — N201 Calculus of ureter: Secondary | ICD-10-CM

## 2018-11-23 DIAGNOSIS — N183 Chronic kidney disease, stage 3 (moderate): Secondary | ICD-10-CM | POA: Diagnosis present

## 2018-11-23 HISTORY — PX: CYSTOSCOPY/URETEROSCOPY/HOLMIUM LASER/STENT PLACEMENT: SHX6546

## 2018-11-23 HISTORY — PX: CYSTOSCOPY WITH LITHOLAPAXY: SHX1425

## 2018-11-23 HISTORY — PX: CYSTOSCOPY W/ RETROGRADES: SHX1426

## 2018-11-23 SURGERY — CYSTOSCOPY, WITH BLADDER CALCULUS LITHOLAPAXY
Anesthesia: General | Site: Ureter | Laterality: Right

## 2018-11-23 MED ORDER — ONDANSETRON HCL 4 MG/2ML IJ SOLN: 4.0000 mg | Freq: Once | INTRAMUSCULAR | Status: DC | PRN

## 2018-11-23 MED ORDER — CEFAZOLIN SODIUM-DEXTROSE 2-4 GM/100ML-% IV SOLN
2.0000 g | INTRAVENOUS | Status: AC
  Administered 2018-11-23: 2 g via INTRAVENOUS

## 2018-11-23 MED ORDER — FENTANYL CITRATE (PF) 100 MCG/2ML IJ SOLN
INTRAMUSCULAR | Status: AC
  Filled 2018-11-23: qty 2

## 2018-11-23 MED ORDER — MIDAZOLAM HCL 2 MG/2ML IJ SOLN
INTRAMUSCULAR | Status: AC
  Filled 2018-11-23: qty 2

## 2018-11-23 MED ORDER — MIDAZOLAM HCL 2 MG/2ML IJ SOLN
INTRAMUSCULAR | Status: DC | PRN
  Administered 2018-11-23: 2 mg via INTRAVENOUS

## 2018-11-23 MED ORDER — LACTATED RINGERS IV SOLN
INTRAVENOUS | Status: DC
  Administered 2018-11-23 – 2018-11-25 (×5): via INTRAVENOUS

## 2018-11-23 MED ORDER — ONDANSETRON HCL 4 MG/2ML IJ SOLN
INTRAMUSCULAR | Status: AC
  Filled 2018-11-23: qty 2

## 2018-11-23 MED ORDER — FAMOTIDINE 20 MG PO TABS
20.0000 mg | ORAL_TABLET | Freq: Two times a day (BID) | ORAL | Status: DC
  Administered 2018-11-23 – 2018-11-28 (×10): 20 mg via ORAL
  Filled 2018-11-23 (×10): qty 1

## 2018-11-23 MED ORDER — LIDOCAINE HCL (CARDIAC) PF 100 MG/5ML IV SOSY
PREFILLED_SYRINGE | INTRAVENOUS | Status: DC | PRN
  Administered 2018-11-23: 60 mg via INTRAVENOUS

## 2018-11-23 MED ORDER — VITAMIN B-1 100 MG PO TABS
250.0000 mg | ORAL_TABLET | Freq: Every day | ORAL | Status: DC
  Administered 2018-11-23 – 2018-11-28 (×6): 250 mg via ORAL
  Filled 2018-11-23 (×7): qty 3

## 2018-11-23 MED ORDER — DEXAMETHASONE SODIUM PHOSPHATE 10 MG/ML IJ SOLN
INTRAMUSCULAR | Status: DC | PRN
  Administered 2018-11-23: 10 mg via INTRAVENOUS

## 2018-11-23 MED ORDER — METOPROLOL SUCCINATE ER 100 MG PO TB24: 100.0000 mg | ORAL_TABLET | Freq: Every day | ORAL | Status: DC

## 2018-11-23 MED ORDER — FENTANYL CITRATE (PF) 100 MCG/2ML IJ SOLN
INTRAMUSCULAR | Status: DC | PRN
  Administered 2018-11-23 (×6): 25 ug via INTRAVENOUS

## 2018-11-23 MED ORDER — PROPOFOL 10 MG/ML IV BOLUS
INTRAVENOUS | Status: AC
  Filled 2018-11-23: qty 20

## 2018-11-23 MED ORDER — IOHEXOL 180 MG/ML  SOLN
INTRAMUSCULAR | Status: DC | PRN
  Administered 2018-11-23: 20 mL

## 2018-11-23 MED ORDER — DEXAMETHASONE SODIUM PHOSPHATE 10 MG/ML IJ SOLN
INTRAMUSCULAR | Status: AC
  Filled 2018-11-23: qty 1

## 2018-11-23 MED ORDER — PHENYLEPHRINE HCL (PRESSORS) 10 MG/ML IV SOLN
INTRAVENOUS | Status: DC | PRN
  Administered 2018-11-23: 100 ug via INTRAVENOUS

## 2018-11-23 MED ORDER — FUROSEMIDE 40 MG PO TABS
40.0000 mg | ORAL_TABLET | Freq: Every day | ORAL | Status: DC
  Administered 2018-11-23 – 2018-11-28 (×6): 40 mg via ORAL
  Filled 2018-11-23 (×6): qty 1

## 2018-11-23 MED ORDER — METOPROLOL SUCCINATE ER 50 MG PO TB24
50.0000 mg | ORAL_TABLET | Freq: Every day | ORAL | Status: DC
  Administered 2018-11-23 – 2018-11-28 (×7): 50 mg via ORAL
  Filled 2018-11-23 (×5): qty 1
  Filled 2018-11-23: qty 2
  Filled 2018-11-23 (×3): qty 1

## 2018-11-23 MED ORDER — POTASSIUM CHLORIDE ER 20 MEQ PO TBCR: 1.0000 | EXTENDED_RELEASE_TABLET | Freq: Two times a day (BID) | ORAL | Status: DC

## 2018-11-23 MED ORDER — FENTANYL CITRATE (PF) 100 MCG/2ML IJ SOLN: 25.0000 ug | INTRAMUSCULAR | Status: DC | PRN

## 2018-11-23 MED ORDER — FINASTERIDE 5 MG PO TABS
5.0000 mg | ORAL_TABLET | Freq: Every day | ORAL | Status: DC
  Administered 2018-11-24 – 2018-11-28 (×5): 5 mg via ORAL
  Filled 2018-11-23 (×5): qty 1

## 2018-11-23 MED ORDER — POTASSIUM CHLORIDE CRYS ER 20 MEQ PO TBCR
20.0000 meq | EXTENDED_RELEASE_TABLET | Freq: Two times a day (BID) | ORAL | Status: DC
  Administered 2018-11-23 – 2018-11-28 (×10): 20 meq via ORAL
  Filled 2018-11-23 (×10): qty 1

## 2018-11-23 MED ORDER — PANTOPRAZOLE SODIUM 40 MG PO TBEC
40.0000 mg | DELAYED_RELEASE_TABLET | Freq: Two times a day (BID) | ORAL | Status: DC
  Administered 2018-11-23 – 2018-11-28 (×10): 40 mg via ORAL
  Filled 2018-11-23 (×10): qty 1

## 2018-11-23 MED ORDER — LIDOCAINE HCL (PF) 2 % IJ SOLN
INTRAMUSCULAR | Status: AC
  Filled 2018-11-23: qty 10

## 2018-11-23 MED ORDER — FAMOTIDINE 20 MG PO TABS
ORAL_TABLET | ORAL | Status: AC
  Administered 2018-11-23: 20 mg via ORAL
  Filled 2018-11-23: qty 1

## 2018-11-23 MED ORDER — CEFAZOLIN SODIUM-DEXTROSE 2-4 GM/100ML-% IV SOLN
INTRAVENOUS | Status: AC
  Filled 2018-11-23: qty 100

## 2018-11-23 MED ORDER — PROPOFOL 10 MG/ML IV BOLUS
INTRAVENOUS | Status: DC | PRN
  Administered 2018-11-23: 120 mg via INTRAVENOUS

## 2018-11-23 MED ORDER — FAMOTIDINE 20 MG PO TABS
20.0000 mg | ORAL_TABLET | Freq: Once | ORAL | Status: AC
  Administered 2018-11-23: 10:00:00 20 mg via ORAL

## 2018-11-23 MED ORDER — ONDANSETRON HCL 4 MG/2ML IJ SOLN
INTRAMUSCULAR | Status: DC | PRN
  Administered 2018-11-23: 4 mg via INTRAVENOUS

## 2018-11-23 SURGICAL SUPPLY — 45 items
ADAPTER IRRIG TUBE 2 SPIKE SOL (ADAPTER) ×4 IMPLANT
ADPR TBG 2 SPK PMP STRL ASCP (ADAPTER) ×4
BAG DRAIN CYSTO-URO LG1000N (MISCELLANEOUS) ×4 IMPLANT
BASKET PARACHUTE 3.1  ZE (MISCELLANEOUS) ×2 IMPLANT
BASKET ZERO TIP 1.9FR (BASKET) ×2 IMPLANT
BRUSH SCRUB EZ 1% IODOPHOR (MISCELLANEOUS) ×4 IMPLANT
BSKT STON RTRVL ZERO TP 1.9FR (BASKET) ×2
CATH FOL 2WAY LX 20X30 (CATHETERS) ×2 IMPLANT
CATH URETL 5X70 OPEN END (CATHETERS) ×2 IMPLANT
CNTNR SPEC 2.5X3XGRAD LEK (MISCELLANEOUS) ×2
CONT SPEC 4OZ STER OR WHT (MISCELLANEOUS) ×2
CONT SPEC 4OZ STRL OR WHT (MISCELLANEOUS) ×2
CONTAINER SPEC 2.5X3XGRAD LEK (MISCELLANEOUS) IMPLANT
DRAPE UTILITY 15X26 TOWEL STRL (DRAPES) ×4 IMPLANT
FIBER LASER TRAC TIP (UROLOGICAL SUPPLIES) ×2 IMPLANT
GLOVE BIO SURGEON STRL SZ8 (GLOVE) ×4 IMPLANT
GLOVE BIOGEL M 6.5 STRL (GLOVE) ×2 IMPLANT
GLOVE PROTEXIS LATEX SZ 7.5 (GLOVE) ×4 IMPLANT
GLOVE SURG LATEX 7.5 PF (GLOVE) IMPLANT
GOWN STRL REUS W/ TWL LRG LVL3 (GOWN DISPOSABLE) ×4 IMPLANT
GOWN STRL REUS W/ TWL XL LVL3 (GOWN DISPOSABLE) ×2 IMPLANT
GOWN STRL REUS W/TWL LRG LVL3 (GOWN DISPOSABLE) ×8
GOWN STRL REUS W/TWL XL LVL3 (GOWN DISPOSABLE) ×4
GUIDEWIRE STR DUAL SENSOR (WIRE) ×4 IMPLANT
INFUSOR MANOMETER BAG 3000ML (MISCELLANEOUS) ×4 IMPLANT
INTRODUCER DILATOR DOUBLE (INTRODUCER) IMPLANT
IV NS IRRIG 3000ML ARTHROMATIC (IV SOLUTION) ×30 IMPLANT
KIT PROBE 340X3.4XDISP GRN (MISCELLANEOUS) IMPLANT
KIT PROBE TRILOGY 3.4X340 (MISCELLANEOUS) ×4
KIT TURNOVER CYSTO (KITS) ×4 IMPLANT
PACK CYSTO AR (MISCELLANEOUS) ×4 IMPLANT
SET CYSTO W/LG BORE CLAMP LF (SET/KITS/TRAYS/PACK) ×2 IMPLANT
SET IRRIG Y TYPE TUR BLADDER L (SET/KITS/TRAYS/PACK) ×4 IMPLANT
SHEATH URETERAL 12FRX35CM (MISCELLANEOUS) IMPLANT
SOL .9 NS 3000ML IRR  AL (IV SOLUTION)
SOL .9 NS 3000ML IRR AL (IV SOLUTION)
SOL .9 NS 3000ML IRR UROMATIC (IV SOLUTION) ×2 IMPLANT
STENT URET 6FRX24 CONTOUR (STENTS) ×2 IMPLANT
STENT URET 6FRX26 CONTOUR (STENTS) IMPLANT
SURGILUBE 2OZ TUBE FLIPTOP (MISCELLANEOUS) ×4 IMPLANT
SYR 10ML LL (SYRINGE) ×2 IMPLANT
SYRINGE IRR TOOMEY STRL 70CC (SYRINGE) ×4 IMPLANT
VALVE UROSEAL ADJ ENDO (VALVE) IMPLANT
WATER STERILE IRR 1000ML POUR (IV SOLUTION) ×2 IMPLANT
WATER STERILE IRR 3000ML UROMA (IV SOLUTION) IMPLANT

## 2018-11-23 NOTE — Interval H&P Note (Signed)
History and Physical Interval Note:  11/23/2018 10:12 AM  Marvin Ortega  has presented today for surgery, with the diagnosis of bladder calculus,right ureteral calculi.  The various methods of treatment have been discussed with the patient and family. After consideration of risks, benefits and other options for treatment, the patient has consented to  Procedure(s): CYSTOSCOPY WITH LITHOLAPAXY (N/A) CYSTOSCOPY/URETEROSCOPY/HOLMIUM LASER/STENT PLACEMENT (N/A) as a surgical intervention.  The patient's history has been reviewed, patient examined, no change in status, stable for surgery.  I have reviewed the patient's chart and labs.  Questions were answered to the patient's satisfaction.     [place]

## 2018-11-23 NOTE — Anesthesia Procedure Notes (Signed)
Procedure Name: LMA Insertion Date/Time: 11/23/2018 10:43 AM Performed by: Eben Burow, CRNA Pre-anesthesia Checklist: Patient identified, Emergency Drugs available, Suction available and Patient being monitored Patient Re-evaluated:Patient Re-evaluated prior to induction Oxygen Delivery Method: Circle system utilized Preoxygenation: Pre-oxygenation with 100% oxygen Induction Type: IV induction Ventilation: Mask ventilation without difficulty LMA: LMA inserted LMA Size: 4.0 Number of attempts: 1 Placement Confirmation: positive ETCO2 and breath sounds checked- equal and bilateral Tube secured with: Tape Dental Injury: Teeth and Oropharynx as per pre-operative assessment

## 2018-11-23 NOTE — Op Note (Signed)
Preoperative diagnosis:  1. Right ureteral calculi 2. Bladder calculus (5 cm)  Postoperative diagnosis:  Same   Procedure:  1. Cystoscopy 2. Right ureteroscopy and stone removal 3. Ureteroscopic laser lithotripsy 4. Right ureteral stent placement (6FR) 24 cm 5. Right retrograde pyelography with interpretation 6. Cystolitholapaxy >2.5 cm (staged)  Surgeon: Lorin PicketScott C. Tod Abrahamsen, M.D.  Anesthesia: General  Complications: None  Intraoperative findings:  1. Right retrograde pyelography post procedure showed no filling defects, stone fragments or contrast extravasation  2. Large, dense jackstone bladder calculus  EBL: Minimal  Specimens: 1. Calculus fragments for analysis   Indication: Karmen BongoWallace E Encarnacion is a 68 y.o. male initially referred for total gross painless hematuria.  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  There was initially elected to treat his distal ureteral calculi and bladder calculus.  He denied bothersome lower urinary tract symptoms and refused a bladder outlet procedure.  After reviewing the management options for treatment, the patient elected to proceed with the above surgical procedure(s). We have discussed the potential benefits and risks of the procedure, side effects of the proposed treatment, the likelihood of the patient achieving the goals of the procedure, and any potential problems that might occur during the procedure or recuperation. Informed consent has been obtained.  Description of procedure:  The patient was taken to the operating room and general anesthesia was induced.  The patient was placed in the dorsal lithotomy position, prepped and draped in the usual sterile fashion, and preoperative antibiotics were administered. A preoperative time-out was performed.   A 21 French cystoscope was lubricated and passed under direct  vision.  The urethra was normal in caliber without stricture.  The prostate demonstrated mild lateral lobe enlargement and moderate median lobe.  Panendoscopy was performed and the bladder mucosa showed the large bladder calculus.  The ureteral orifice ease were normal appearing bilaterally.  No solid or papillary bladder mucosal lesions were identified.  The bladder mucosa was slightly erythematous secondary to the calculus.   Attention was directed to the right ureteral orifice and a 0.038 Sensor wire was then advanced up the right ureter into the renal pelvis under fluoroscopic guidance.  A 4.5 Fr semirigid ureteroscope was then advanced into the ureter next to the guidewire and the calculi were identified in the distal ureter.  The stones were then fragmented with a 200 micron holmium laser fiber on a setting of 0.2 J and frequency of 20 hz.  These stones were noted to be hard.  All stone fragments were then removed from the ureter with a zero tip nitinol basket.  Reinspection of the ureter revealed no remaining visible stones or fragments.   Retrograde pyelogram was performed with findings as described above.  A 6 FR/24 CM Contour stent was was placed under fluoroscopic guidance.  The wire was then removed with an adequate stent curl noted in the renal pelvis as well as in the bladder.  A 26 [place]French Piranha scope sheath with obturator was lubricated and passed under direct vision.  The inner sheath was removed and the offset scope was placed into the sheath.  A Swiss Psychologist, sport and exerciseLithoclast Trilogy Lithotripter was then placed through the scope and fragmentation of the calculus was commenced with subsequent increase in settings to 100% shockwave/12 Hz.  The stone was noted to be extremely hard and after approximately 2 hours greater than 50% of the  stone had been fragmented.  It was elected at this point to perform a staged procedure.  Several small fragments were irrigated from the bladder.  The scope was  removed.  A an 17 French Foley catheter was placed with return of clear effluent upon irrigation.  After anesthetic reversal he was transported to the PACU in stable condition.  Plan: He will be scheduled for follow-up cystolitholapaxy within the next 1-2 weeks.   John Giovanni, MD

## 2018-11-23 NOTE — Anesthesia Preprocedure Evaluation (Signed)
Anesthesia Evaluation  Patient identified by MRN, date of birth, ID band Patient awake    Reviewed: Allergy & Precautions, NPO status , Patient's Chart, lab work & pertinent test results, reviewed documented beta blocker date and time   History of Anesthesia Complications Negative for: history of anesthetic complications  Airway Mallampati: III       Dental  (+) Upper Dentures, Poor Dentition, Chipped, Missing   Pulmonary neg sleep apnea, neg COPD, Not current smoker,           Cardiovascular hypertension, Pt. on medications and Pt. on home beta blockers (-) Past MI and (-) CHF (-) dysrhythmias (-) Valvular Problems/Murmurs     Neuro/Psych neg Seizures    GI/Hepatic Neg liver ROS, GERD  Medicated and Controlled,  Endo/Other  neg diabetes  Renal/GU Renal disease (stones)     Musculoskeletal   Abdominal   Peds  Hematology   Anesthesia Other Findings   Reproductive/Obstetrics                             Anesthesia Physical Anesthesia Plan  ASA: III  Anesthesia Plan: General   Post-op Pain Management:    Induction: Intravenous  PONV Risk Score and Plan: 2 and Ondansetron and Dexamethasone  Airway Management Planned: LMA  Additional Equipment:   Intra-op Plan:   Post-operative Plan:   Informed Consent: I have reviewed the patients History and Physical, chart, labs and discussed the procedure including the risks, benefits and alternatives for the proposed anesthesia with the patient or authorized representative who has indicated his/her understanding and acceptance.       Plan Discussed with:   Anesthesia Plan Comments:         Anesthesia Quick Evaluation

## 2018-11-23 NOTE — Anesthesia Post-op Follow-up Note (Signed)
Anesthesia QCDR form completed.        

## 2018-11-23 NOTE — Transfer of Care (Signed)
Immediate Anesthesia Transfer of Care Note  Patient: BOW BUNTYN  Procedure(s) Performed: CYSTOSCOPY WITH LITHOLAPAXY (N/A Bladder) CYSTOSCOPY/URETEROSCOPY/HOLMIUM LASER/STENT PLACEMENT (Right Ureter) CYSTOSCOPY WITH RETROGRADE PYELOGRAM (Right Ureter)  Patient Location: PACU  Anesthesia Type:General  Level of Consciousness: drowsy  Airway & Oxygen Therapy: Patient Spontanous Breathing and Patient connected to face mask oxygen  Post-op Assessment: Report given to RN and Post -op Vital signs reviewed and stable  Post vital signs: Reviewed and stable  Last Vitals:  Vitals Value Taken Time  BP 132/93 11/23/18 1348  Temp 97.3   Pulse 62 11/23/18 1350  Resp 11 11/23/18 1350  SpO2 100 % 11/23/18 1350  Vitals shown include unvalidated device data.  Last Pain:  Vitals:   11/23/18 1348  TempSrc:   PainSc: (P) Asleep         Complications: No apparent anesthesia complications

## 2018-11-24 LAB — C DIFFICILE QUICK SCREEN W PCR REFLEX
C Diff antigen: POSITIVE — AB
C Diff interpretation: DETECTED
C Diff toxin: POSITIVE — AB

## 2018-11-24 NOTE — Care Management Obs Status (Signed)
[place] NOTIFICATION   Patient Details  Name: Marvin Ortega MRN: [id number] Date of Birth: Apr 22, 1950   Medicare Observation Status Notification Given:  Yes    Shelbie Hutching, RN 11/24/2018, 2:32 PM

## 2018-11-24 NOTE — Evaluation (Signed)
Physical Therapy Evaluation Patient Details Name: Marvin Ortega MRN: [id number] DOB: [birth year] Today's Date: 11/24/2018   History of Present Illness  Pt is a 68 year old male admitted with renal calculus and systocscopy with litholapaxy performed 11/23/2018. PMH includes dyspnea, chronic alcoholism and hospitalization last year for acute melena with hemorrhagic shock.  Clinical Impression  Pt is a 68 year old male who lives in a multi-story home alone.  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  Pt able to perform bed mobility mod I and sit without difficulty.  He stood from bedside without UE assistance, appearing slightly unsteady but able to stand without use of AD.  Pt with fair balance during standing ADL tasks and formal balance training.  He demonstrated deficits with dynamic and static tasks requiring narrow BOS and reaching outside BOS.  Pt presented with fair strength of UE/LE and reported no sensation loss or visual deficits.  He was able to complete 100 ft of ambulation without AD and with two standing rest breaks, appearing slightly unsteady and reporting fatigue.  PT discussed pt having SPC nearby during recovery in case he needs it.  Pt will continue to benefit from skilled PT with focus on strength, balance, tolerance to activity and AD training.    Follow Up Recommendations Home health PT;Supervision - Intermittent    Equipment Recommendations  Cane;Rolling walker with 5" wheels(Pt states that he has both a SPC and RW at home.)    Recommendations for Other Services       Precautions / Restrictions Precautions Precautions: Fall      Mobility  Bed Mobility Overal bed mobility: Modified Independent             General bed mobility comments: Increased time  Transfers Overall transfer level: Needs assistance   Transfers: Sit to/from Stand Sit to Stand: Supervision         General transfer comment: Pt able to rise from bedside without UE  assistance.  Slow and maintaining a flexed posture at first.  States that he does not feel dizzy but generally weak.  Ambulation/Gait Ambulation/Gait assistance: Min guard Gait Distance (Feet): 100 Feet       Gait velocity interpretation: 1.31 - 2.62 ft/sec, indicative of limited community ambulator General Gait Details: Wide BOS, fair step length and foot clearance.  Pt refused when offered to hold IV pole for support.  No LOB's but pt did stop 2x and appeared to be processing directions, reported feeling very weak but without dizziness.  Stairs            Wheelchair Mobility    Modified Rankin (Stroke Patients Only)       Balance Overall balance assessment: Independent             Standing balance comment: Able to stand at sink and wash hands without UE support. Single Leg Stance - Right Leg: 3 Single Leg Stance - Left Leg: 3 Tandem Stance - Right Leg: 10 Tandem Stance - Left Leg: 6 Rhomberg - Eyes Opened: 10 Rhomberg - Eyes Closed: 10     Standardized Balance Assessment Standardized Balance Assessment : Berg Balance Test Berg Balance Test Sit to Stand: Able to stand without using hands and stabilize independently Standing Unsupported: Able to stand safely 2 minutes Sitting with Back Unsupported but Feet Supported on Floor or Stool: Able to sit safely and securely 2 minutes Stand to Sit: Controls descent by using hands Transfers: Able to transfer safely, minor use of  hands Standing Unsupported with Eyes Closed: Able to stand 10 seconds with supervision Standing Ubsupported with Feet Together: Able to place feet together independently and stand for 1 minute with supervision From Standing, Reach Forward with Outstretched Arm: Can reach forward >5 cm safely (2") From Standing Position, Pick up Object from Floor: Unable to try/needs assist to keep balance From Standing Position, Turn to Look Behind Over each Shoulder: Looks behind one side only/other side shows  less weight shift Turn 360 Degrees: Able to turn 360 degrees safely but slowly Standing Unsupported, Alternately Place Feet on Step/Stool: Able to stand independently and complete 8 steps >20 seconds Standing Unsupported, One Foot in Front: Needs help to step but can hold 15 seconds Standing on One Leg: Able to lift leg independently and hold equal to or more than 3 seconds Total Score: 38         Pertinent Vitals/Pain Pain Assessment: No/denies pain    Home Living Family/patient expects to be discharged to:: Private residence Living Arrangements: Alone   Type of Home: House Home Access: Stairs to enter Entrance Stairs-Rails: Right Entrance Stairs-Number of Steps: 3 Home Layout: Multi-level;Able to live on main level with bedroom/bathroom Home Equipment: None      Prior Function Level of Independence: Independent         Comments: Community ambulator without AD, able to drive and obtain groceries on his own.     Hand Dominance        Extremity/Trunk Assessment   Upper Extremity Assessment Upper Extremity Assessment: Overall WFL for tasks assessed    Lower Extremity Assessment Lower Extremity Assessment: Overall WFL for tasks assessed(Grossly 4/5 bilaterally.)    Cervical / Trunk Assessment Cervical / Trunk Assessment: Normal  Communication   Communication: No difficulties  Cognition Arousal/Alertness: Lethargic Behavior During Therapy: Flat affect Overall Cognitive Status: Within Functional Limits for tasks assessed                                 General Comments: Pt intermittently responsive and not giving answers to questions.  Appears to understand questions but generally indifferent.  Pt's sister present to assist with history and decision making.      General Comments      Exercises Other Exercises Other Exercises: Time to discuss benefit of home health and possible adaptations needed for obtaining groceries and use of SPC. x5 min    Assessment/Plan    PT Assessment Patient needs continued PT services  PT Problem List Decreased strength;Decreased mobility;Decreased activity tolerance;Decreased balance;Decreased knowledge of use of DME       PT Treatment Interventions DME instruction;Therapeutic activities;Gait training;Therapeutic exercise;Patient/family education;Stair training;Balance training;Functional mobility training    PT Goals (Current goals can be found in the Care Plan section)  Acute Rehab PT Goals Patient Stated Goal: To be able to get out and obtain his own groceries again as soon as possible. PT Goal Formulation: With patient Time For Goal Achievement: 12/08/18 Potential to Achieve Goals: Good    Frequency Min 2X/week   Barriers to discharge        Co-evaluation               AM-PAC PT "6 Clicks" Mobility  Outcome Measure Help needed turning from your back to your side while in a flat bed without using bedrails?: A Little Help needed moving from lying on your back to sitting on the side of a flat bed without  using bedrails?: A Little Help needed moving to and from a bed to a chair (including a wheelchair)?: A Little Help needed standing up from a chair using your arms (e.g., wheelchair or bedside chair)?: A Little Help needed to walk in hospital room?: A Little Help needed climbing 3-5 steps with a railing? : A Little 6 Click Score: 18    End of Session Equipment Utilized During Treatment: Gait belt Activity Tolerance: Patient tolerated treatment well;Patient limited by fatigue Patient left: in bed;with call bell/phone within reach;with bed alarm set;with family/visitor present;with nursing/sitter in room Nurse Communication: Mobility status PT Visit Diagnosis: Unsteadiness on feet (R26.81);Muscle weakness (generalized) (M62.81);Difficulty in walking, not elsewhere classified (R26.2)    Time: 1027-2536 PT Time Calculation (min) (ACUTE ONLY): 29 min   Charges:   PT  Evaluation $PT Eval Low Complexity: 1 Low PT Treatments $Therapeutic Activity: 8-22 mins        Glenetta Hew, PT, DPT   Glenetta Hew 11/24/2018, 2:31 PM

## 2018-11-24 NOTE — Progress Notes (Signed)
Dr. Erlene Quan from urology notified about C. Diff result being positive. Dr. Erlene Quan indicates they will touch base with ID tomorrow about results.

## 2018-11-24 NOTE — Progress Notes (Signed)
PT has evaluated the patient. Patient will be D/C with home health services. Positive for C. Diff.

## 2018-11-24 NOTE — Progress Notes (Signed)
No complaints this morning VSS, afebrile  Exam: Abdomen soft.  Urine pink-tinged  Impression: Status post ureteroscopic stone removal and partial cystolitholapaxy of a large bladder calculus.  Plan: He is ready for discharge however lives alone and his sister has requested short-term rehab.  We will tentatively schedule follow-up cystolitholapaxy next week.

## 2018-11-24 NOTE — Progress Notes (Signed)
MD notified: The patient just his had his 3rd liquid stool and has not had any stool softeners. Would you like to place an order for  C. Diff?

## 2018-11-24 NOTE — TOC Initial Note (Signed)
Transition of Care [facility](TOC) - Initial/Assessment Note    Patient Details  Name: Marvin Ortega MRN: [id number] Date of Birth: 05/05/1950  Transition of Care [facility](TOC) CM/SW Contact:    Allayne ButcherJeanna M Maddy Graham, RN Phone Number: 11/24/2018, 2:35 PM  Clinical Narrative:                 Patient is from home and lives alone, patient's sister is at the bedside and expressed concerns about patient going home by himself.  PT has worked with patient and he did well with no assistive devices walking around the nurses station.  The patient is weaker than baseline but does not require SNF.  Patient is agreeable to home health services being arranged.  Agency choice offered, patient has no preference.  Referral for home health given to Mellissa Kohuteresa Cooper with [facility] for RN, PT, OT and aide.  Patient's sister will be able to provide transportation home.   RNCM provided information on Meals on Wheels for patient, patient normally drives but is recommended to stay at home while recovering.  Sister was worried because she [place] in [place] and the patient has no one close by to help out.   RNCM also provided patient with information on [employer] for transportation and informed him of grocery stores that deliver and other food delivery services like [employer] and [employer].   Patient and sister are agreeable to discharge plan.   Expected Discharge Plan: Home w Home Health Services Barriers to Discharge: Continued Medical Work up   Patient Goals and CMS Choice Patient states their goals for this hospitalization and ongoing recovery are:: To go home and for my breathing to get better- patient has mentioned wheezing to his PCP since November CMS Medicare.gov Compare Post Acute Care list provided to:: Patient Choice offered to / list presented to : Patient  Expected Discharge Plan and Services Expected Discharge Plan: Home w Home Health Services   Discharge Planning Services: CM Consult Post Acute Care Choice: Home Health Living  arrangements for the past 2 months: Single Family Home                           HH Arranged: RN, PT, OT, Nurse's Aide [facility] Date HH Agency Contacted: 11/24/18 Time HH Agency Contacted: 1434 [facility] Agency: Mellissa Kohuteresa Cooper  Prior Living Arrangements/Services Living arrangements for the past 2 months: Single Family Home Lives with:: Self Patient language and need for interpreter reviewed:: No Do you feel safe going back to the place where you live?: Yes      Need for Family Participation in Patient Care: Yes (Comment)(lives alone) Care giver support system in place?: Yes (comment)(sister)   Criminal Activity/Legal Involvement Pertinent to Current Situation/Hospitalization: No - Comment as needed  Activities of Daily Living Home Assistive Devices/Equipment: Dentures (specify type) ADL Screening (condition at time of admission) Patient's cognitive ability adequate to safely complete daily activities?: Yes Is the patient deaf or have difficulty hearing?: Yes Does the patient have difficulty seeing, even when wearing glasses/contacts?: No Does the patient have difficulty concentrating, remembering, or making decisions?: No Patient able to express need for assistance with ADLs?: Yes Does the patient have difficulty dressing or bathing?: No Independently performs ADLs?: Yes (appropriate for developmental age) Does the patient have difficulty walking or climbing stairs?: No Weakness of Legs: None Weakness of Arms/Hands: None  Permission Sought/Granted Permission sought to share  information with : Case Manager, Other (comment), Family Supports Permission granted to share information with : Yes, Verbal Permission Granted     Permission granted to share info w AGENCY: [facility]  Permission granted to share info w Relationship: Sister     Emotional Assessment Appearance:: Appears stated  age Attitude/Demeanor/Rapport: Engaged Affect (typically observed): Quiet, Accepting Orientation: : Oriented to Self, Oriented to Place, Oriented to  Time, Oriented to Situation Alcohol / Substance Use: Not Applicable Psych Involvement: No (comment)  Admission diagnosis:  bladder calculus,right ureteral calculi Patient Active Problem List   Diagnosis Date Noted  . Renal calculus 11/23/2018  . Ureteral calculi 10/29/2018  . Calculus of bladder 10/29/2018  . Nephrolithiasis 10/29/2018  . Gross hematuria 09/28/2018  . PAF (paroxysmal atrial fibrillation) ([place]) 02/25/2018  . Benign prostatic hyperplasia with lower urinary tract symptoms 12/08/2013  . Essential (primary) hypertension 12/08/2013  . AA (alcohol abuse) 12/08/2013  . GERD (gastroesophageal reflux disease) 02/13/2011  . Hyperlipidemia 06/20/2010   PCP:  Derinda Late, MD Pharmacy:   [facility] [address], [place] - [place] [address] [place] [address] Phone: [phone] Fax: [phone]     Social Determinants of Health (SDOH) Interventions    Readmission Risk Interventions No flowsheet data found.

## 2018-11-25 DIAGNOSIS — D509 Iron deficiency anemia, unspecified: Secondary | ICD-10-CM | POA: Diagnosis present

## 2018-11-25 DIAGNOSIS — K219 Gastro-esophageal reflux disease without esophagitis: Secondary | ICD-10-CM | POA: Diagnosis present

## 2018-11-25 DIAGNOSIS — R3129 Other microscopic hematuria: Secondary | ICD-10-CM | POA: Diagnosis present

## 2018-11-25 DIAGNOSIS — N210 Calculus in bladder: Secondary | ICD-10-CM | POA: Diagnosis present

## 2018-11-25 DIAGNOSIS — R159 Full incontinence of feces: Secondary | ICD-10-CM | POA: Diagnosis present

## 2018-11-25 DIAGNOSIS — N201 Calculus of ureter: Secondary | ICD-10-CM | POA: Diagnosis not present

## 2018-11-25 DIAGNOSIS — N400 Enlarged prostate without lower urinary tract symptoms: Secondary | ICD-10-CM | POA: Diagnosis present

## 2018-11-25 DIAGNOSIS — I129 Hypertensive chronic kidney disease with stage 1 through stage 4 chronic kidney disease, or unspecified chronic kidney disease: Secondary | ICD-10-CM | POA: Diagnosis present

## 2018-11-25 DIAGNOSIS — N183 Chronic kidney disease, stage 3 (moderate): Secondary | ICD-10-CM | POA: Diagnosis present

## 2018-11-25 DIAGNOSIS — A0472 Enterocolitis due to Clostridium difficile, not specified as recurrent: Secondary | ICD-10-CM | POA: Diagnosis present

## 2018-11-25 DIAGNOSIS — R310 Gross hematuria: Secondary | ICD-10-CM | POA: Diagnosis present

## 2018-11-25 DIAGNOSIS — Z8249 Family history of ischemic heart disease and other diseases of the circulatory system: Secondary | ICD-10-CM | POA: Diagnosis not present

## 2018-11-25 DIAGNOSIS — N202 Calculus of kidney with calculus of ureter: Secondary | ICD-10-CM | POA: Diagnosis present

## 2018-11-25 LAB — CBC
HCT: 27.4 % — ABNORMAL LOW (ref 39.0–52.0)
Hemoglobin: 8.8 g/dL — ABNORMAL LOW (ref 13.0–17.0)
MCH: 28.3 pg (ref 26.0–34.0)
MCHC: 32.1 g/dL (ref 30.0–36.0)
MCV: 88.1 fL (ref 80.0–100.0)
Platelets: 233 10*3/uL (ref 150–400)
RBC: 3.11 MIL/uL — ABNORMAL LOW (ref 4.22–5.81)
RDW: 15.8 % — ABNORMAL HIGH (ref 11.5–15.5)
WBC: 9.7 10*3/uL (ref 4.0–10.5)
nRBC: 0 % (ref 0.0–0.2)

## 2018-11-25 LAB — BASIC METABOLIC PANEL
Anion gap: 12 (ref 5–15)
BUN: 14 mg/dL (ref 8–23)
CO2: 23 mmol/L (ref 22–32)
Calcium: 8.6 mg/dL — ABNORMAL LOW (ref 8.9–10.3)
Chloride: 99 mmol/L (ref 98–111)
Creatinine, Ser: 1.25 mg/dL — ABNORMAL HIGH (ref 0.61–1.24)
GFR calc Af Amer: >60 mL/min (ref >60)
GFR calc non Af Amer: 58 mL/min — ABNORMAL LOW (ref >60)
Glucose, Bld: 151 mg/dL — ABNORMAL HIGH (ref 70–99)
Potassium: 3.8 mmol/L (ref 3.5–5.1)
Sodium: 134 mmol/L — ABNORMAL LOW (ref 135–145)

## 2018-11-25 LAB — IRON AND TIBC
Iron: 17 ug/dL — ABNORMAL LOW (ref 45–182)
Saturation Ratios: 4 % — ABNORMAL LOW (ref 17.9–39.5)
TIBC: 399 ug/dL (ref 250–450)
UIBC: 382 ug/dL

## 2018-11-25 MED ORDER — SODIUM CHLORIDE 0.9 % IV SOLN
INTRAVENOUS | Status: DC
  Administered 2018-11-25 – 2018-11-26 (×2): via INTRAVENOUS

## 2018-11-25 MED ORDER — OXYBUTYNIN CHLORIDE 5 MG PO TABS
5.0000 mg | ORAL_TABLET | Freq: Three times a day (TID) | ORAL | Status: DC | PRN
  Administered 2018-11-25 – 2018-11-27 (×3): 5 mg via ORAL
  Filled 2018-11-25 (×3): qty 1

## 2018-11-25 MED ORDER — VANCOMYCIN 50 MG/ML ORAL SOLUTION
125.0000 mg | Freq: Four times a day (QID) | ORAL | Status: DC
  Administered 2018-11-25 – 2018-11-28 (×13): 125 mg via ORAL
  Filled 2018-11-25 (×20): qty 2.5

## 2018-11-25 NOTE — Consult Note (Addendum)
Pharmacy Antibiotic Note  Marvin Ortega is a 68 y.o. male admitted on 11/23/2018 with C diff.  Pharmacy has been consulted for PO Vancomycin dosing.  Do not see a history of prior C Diff infection or treatment in pt history.  Plan: Will start Vancomycin 125mg  qid x 10 days per protocol   Height: 5\' 6"  (167.6 cm) Weight: 153 lb 4.8 oz (69.5 kg) IBW/kg (Calculated) : 63.8  Temp (24hrs), Avg:98.5 F (36.9 C), Min:98.3 F (36.8 C), Max:98.8 F (37.1 C)  No results for input(s): WBC, CREATININE, LATICACIDVEN, VANCOTROUGH, VANCOPEAK, VANCORANDOM, GENTTROUGH, GENTPEAK, GENTRANDOM, TOBRATROUGH, TOBRAPEAK, TOBRARND, AMIKACINPEAK, AMIKACINTROU, AMIKACIN in the last 168 hours.  CrCl cannot be calculated (Patient's most recent lab result is older than the maximum 21 days allowed.).    No Known Allergies  Antimicrobials this admission: 9/15 Cefazolin 2g x 1 9/17 PO Vancomycin >>  Dose adjustments this admission: None  Microbiology results: 9/15 C Diff Tox +, Antigen +  Thank you for allowing pharmacy to be a part of this patient's care.  Lu Duffel, PharmD, BCPS Clinical Pharmacist 11/25/2018 10:17 AM

## 2018-11-25 NOTE — H&P (Signed)
[facility] [place] [facility]   PATIENT NAME: Marvin Ortega    MR#:  [id number]  DATE OF BIRTH:  03/25/1950  DATE OF ADMISSION:  11/23/2018  PRIMARY CARE PHYSICIAN: Babaoff, [name], Marvin Ortega   REQUESTING/REFERRING PHYSICIAN: [place]  CHIEF COMPLAINT:  No chief complaint on file.   HISTORY OF PRESENT ILLNESS: Marvin Ortega  is a 68 y.o. male with a known history of BPH, GERD, Htn- was admitted to Urology service for bladder and ureteral stone procedure- s/p procedure and foley placement. Plan was to d/Ortega with foley and follow in urology clinic for further procedure. Pt started watery diarrhea and Checked and found positive for Ortega diff. He had so far 5 episodes today. Urology requested medicine to take over this pt as there is no active management by them currently. Pt. denies any fever or abd pain. Able to eat fine.  PAST MEDICAL HISTORY:   Past Medical History:  Diagnosis Date  . BPH (benign prostatic hyperplasia)   . Dieulafoy lesion of stomach   . Dyspnea    on exertion   . GERD (gastroesophageal reflux disease)   . GIB (gastrointestinal bleeding) 02/15/2018  . Hemorrhagic shock (HCC)   . History of kidney stones   . Hypertension   . Pre-diabetes   . Upper GI bleed     PAST SURGICAL HISTORY:  Past Surgical History:  Procedure Laterality Date  . CYSTOSCOPY W/ RETROGRADES Right 11/23/2018   Procedure: CYSTOSCOPY WITH RETROGRADE PYELOGRAM;  Surgeon: Marvin Ortega, Marvin Ortega, Marvin Ortega;  Location: [facility] ORS;  Service: Urology;  Laterality: Right;  . CYSTOSCOPY WITH LITHOLAPAXY N/A 11/23/2018   Procedure: CYSTOSCOPY WITH LITHOLAPAXY;  Surgeon: Marvin Ortega, Marvin Ortega, Marvin Ortega;  Location: [facility] ORS;  Service: Urology;  Laterality: N/A;  . CYSTOSCOPY/URETEROSCOPY/HOLMIUM LASER/STENT PLACEMENT Right 11/23/2018   Procedure: CYSTOSCOPY/URETEROSCOPY/HOLMIUM LASER/STENT PLACEMENT;  Surgeon: Marvin Ortega, Marvin Ortega, Marvin Ortega;  Location: [facility] ORS;  Service: Urology;  Laterality: Right;  .  ESOPHAGOGASTRODUODENOSCOPY N/A 02/15/2018   Procedure: ESOPHAGOGASTRODUODENOSCOPY (EGD);  Surgeon: Marvin Ortega, Marvin Reddy, Marvin Ortega;  Location: [facility] ENDOSCOPY;  Service: Gastroenterology;  Laterality: N/A;    SOCIAL HISTORY:  Social History   Tobacco Use  . Smoking status: Never Smoker  . Smokeless tobacco: Never Used  Substance Use Topics  . Alcohol use: Yes    FAMILY HISTORY:  Family History  Problem Relation Age of Onset  . Heart disease Father   . Aneurysm Sister     DRUG ALLERGIES: No Known Allergies  REVIEW OF SYSTEMS:   CONSTITUTIONAL: No fever, fatigue or weakness.  EYES: No blurred or double vision.  EARS, NOSE, AND THROAT: No tinnitus or ear pain.  RESPIRATORY: No cough, shortness of breath, wheezing or hemoptysis.  CARDIOVASCULAR: No chest pain, orthopnea, edema.  GASTROINTESTINAL: No nausea, vomiting,have diarrhea ,no abdominal pain.  GENITOURINARY: No dysuria, hematuria.  ENDOCRINE: No polyuria, nocturia,  HEMATOLOGY: No anemia, easy bruising or bleeding SKIN: No rash or lesion. MUSCULOSKELETAL: No joint pain or arthritis.   NEUROLOGIC: No tingling, numbness, weakness.  PSYCHIATRY: No anxiety or depression.   MEDICATIONS AT HOME:  Prior to Admission medications   Medication Sig Start Date End Date Taking? Authorizing Provider  famotidine (PEPCID) 20 MG tablet Take 20 mg by mouth 2 (two) times daily. 02/10/18  Yes Provider, Historical, Marvin Ortega  finasteride (PROSCAR) 5 MG tablet Take 5 mg by mouth daily.   Yes Provider, Historical, Marvin Ortega  furosemide (LASIX) 40 MG tablet TAKE 1 TABLET BY MOUTH EVERY DAY 09/23/18  Yes Provider, Historical, Marvin Ortega  metoprolol   succinate (TOPROL-XL) 50 MG 24 hr tablet Take 100 mg by mouth daily. 02/13/18  Yes Provider, Historical, Marvin Ortega  pantoprazole (PROTONIX) 40 MG tablet Take 1 tablet (40 mg total) by mouth 2 (two) times daily. 02/19/18  Yes Marvin Ortega, Marvin KennerKaty Dodd, Marvin Ortega  Potassium Chloride ER 20 MEQ TBCR TAKE 1 TABLET BY MOUTH TWICE A DAY 09/23/18  Yes Provider,  Historical, Marvin Ortega  silodosin (RAPAFLO) 8 MG CAPS capsule Take 8 mg by mouth daily. 01/26/18  Yes Provider, Historical, Marvin Ortega  tamsulosin (FLOMAX) 0.4 MG CAPS capsule Take 0.4 mg by mouth daily.  05/06/18 05/06/19 Yes Provider, Historical, Marvin Ortega  Thiamine HCl (VITAMIN B-1) 250 MG tablet Take 250 mg by mouth daily.   Yes Provider, Historical, Marvin Ortega      PHYSICAL EXAMINATION:   VITAL SIGNS: Blood pressure 112/78, pulse 69, temperature 98.4 F (36.9 Ortega), temperature source Oral, resp. rate 16, height 5\' 6"  (1.676 m), weight 69.5 kg, SpO2 99 %.  GENERAL:  68 y.o.-year-old patient lying in the bed with no acute distress.  EYES: Pupils equal, round, reactive to light and accommodation. No scleral icterus. Extraocular muscles intact.  HEENT: Head atraumatic, normocephalic. Oropharynx and nasopharynx clear.  NECK:  Supple, no jugular venous distention. No thyroid enlargement, no tenderness.  LUNGS: Normal breath sounds bilaterally, no wheezing, rales,rhonchi or crepitation. No use of accessory muscles of respiration.  CARDIOVASCULAR: S1, S2 normal. No murmurs, rubs, or gallops.  ABDOMEN: Soft, nontender, nondistended. Bowel sounds present. No organomegaly or mass. Foley in place. EXTREMITIES: No pedal edema, cyanosis, or clubbing.  NEUROLOGIC: Cranial nerves II through XII are intact. Muscle strength 5/5 in all extremities. Sensation intact. Gait not checked.  PSYCHIATRIC: The patient is alert and oriented x 3.  SKIN: No obvious rash, lesion, or ulcer.   LABORATORY PANEL:   CBC Recent Labs  Lab 11/25/18 1332  WBC 9.7  HGB 8.8*  HCT 27.4*  PLT 233  MCV 88.1  MCH 28.3  MCHC 32.1  RDW 15.8*   ------------------------------------------------------------------------------------------------------------------  Chemistries  No results for input(s): NA, K, CL, CO2, GLUCOSE, BUN, CREATININE, CALCIUM, MG, AST, ALT, ALKPHOS, BILITOT in the last 168 hours.  Invalid input(s):  GFRCGP ------------------------------------------------------------------------------------------------------------------ CrCl cannot be calculated (Patient's most recent lab result is older than the maximum 21 days allowed.). ------------------------------------------------------------------------------------------------------------------ No results for input(s): TSH, T4TOTAL, T3FREE, THYROIDAB in the last 72 hours.  Invalid input(s): FREET3   Coagulation profile No results for input(s): INR, PROTIME in the last 168 hours. ------------------------------------------------------------------------------------------------------------------- No results for input(s): DDIMER in the last 72 hours. -------------------------------------------------------------------------------------------------------------------  Cardiac Enzymes No results for input(s): CKMB, TROPONINI, MYOGLOBIN in the last 168 hours.  Invalid input(s): CK ------------------------------------------------------------------------------------------------------------------ Invalid input(s): POCBNP  ---------------------------------------------------------------------------------------------------------------  Urinalysis    Component Value Date/Time   APPEARANCEUR Hazy (A) 10/29/2018 0909   GLUCOSEU Negative 10/29/2018 0909   BILIRUBINUR Negative 10/29/2018 0909   PROTEINUR Negative 10/29/2018 0909   NITRITE Negative 10/29/2018 0909   LEUKOCYTESUR Negative 10/29/2018 0909     RADIOLOGY: No results found.  EKG: Orders placed or performed during the hospital encounter of 11/12/18  . EKG 12 lead  . EKG 12 lead    IMPRESSION AND PLAN:  * Ortega diff diarrhea   Oral vanc cont.   IV fluids for hydration.    Monitor.  * Ureteral stone.   S/p procedure for stone removal, foley in place- need to discharge home with foley and f/up in urology clinic.   Urology to cont to follow while inpatient.  * GERD  Cont PPI.  *  Chronic anemia   Check Iron.  * CKD stage 3   Monitor with IV fluids.  PT had suggested HHAPT on discharge.  All the records are reviewed and case discussed with ED provider. Management plans discussed with the patient, family and they are in agreement.  CODE STATUS: Code Status History    Date Active Date Inactive Code Status Order ID Comments User Context   02/15/2018 2059 02/19/2018 1612 Full Code [id number]  SalaryAvel Peace, Marvin Ortega Inpatient   Advance Care Planning Activity       TOTAL TIME TAKING CARE OF THIS PATIENT: 45 minutes.    Vaughan Basta M.D on 11/25/2018   Between 7am to 6pm - Pager - [phone]  After 6pm go to [email or web address] - password [email or web address] [place] [facility]  Office  [phone]  CC: Primary care physician; Derinda Late, Marvin Ortega   Note: This dictation was prepared with Dragon dictation along with smaller phrase technology. Any transcriptional errors that result from this process are unintentional.

## 2018-11-25 NOTE — Progress Notes (Signed)
Urology notified: The patient is complaing of feeling as he cannot urinate and having pain. Bladder scan per formed, 72ml . 338ml emptied from foley bag this AM. He does not have any PRN pain meds or PRN meds for bladder spams would you like to order so meds. Dr. Erlene Quan order a pharmacy consult for the patient yesterday as the patient tested positive for C. Diff.

## 2018-11-25 NOTE — Progress Notes (Signed)
Physical Therapy Treatment Patient Details Name: Marvin Ortega MRN: [id number] DOB: 31-Dec-1950 Today's Date: 11/25/2018    History of Present Illness Pt is a 68 year old male admitted with renal calculus and systocscopy with litholapaxy performed 11/23/2018. PMH includes dyspnea, chronic alcoholism and hospitalization last year for acute melena with hemorrhagic shock.    PT Comments    Pt appearing more alert and willing to work with therapy today.  He was able to perform bed mobility independently and stand from bedside without difficulty.  Pt allowed time for toileting and SBA during clean up.  He was able to perform all standing balance exercises, CGA, without significant LOB or deviation, though he did appear unsteady on feet 3-4 times.  Pt appeared to fatigue during seated there ex following balance training and began to only participate with 3-4 reps of each set.  Pt ended session in bed and reported no pain.  He will continue to benefit from skilled PT with focus on strength, tolerance to activity, balance and fall prevention.   Follow Up Recommendations  Home health PT;Supervision - Intermittent     Equipment Recommendations  Cane;Rolling walker with 5" wheels(Pt states that he has both a SPC and RW at home.)    Recommendations for Other Services       Precautions / Restrictions Precautions Precautions: Fall Precaution Comments: Contact pre    Mobility  Bed Mobility Overal bed mobility: Modified Independent             General bed mobility comments: Increased time  Transfers Overall transfer level: Needs assistance   Transfers: Sit to/from Stand Sit to Stand: Supervision         General transfer comment: Pt better able to stand from bedside with minimal UE assistance.  Pt also able to stand from [facility] without PT assistance.  Ambulation/Gait Ambulation/Gait assistance: Min guard Gait Distance (Feet): 100 Feet Assistive device: None Gait Pattern/deviations:  Wide base of support   Gait velocity interpretation: 1.31 - 2.62 ft/sec, indicative of limited community ambulator General Gait Details: Better step length and foot clearance today.  Pt did appear slightly unsteady at times but no LOB's experienced.  CGA at all times.   Stairs             Wheelchair Mobility    Modified Rankin (Stroke Patients Only)       Balance Overall balance assessment: Independent Sitting-balance support: Feet supported Sitting balance-Leahy Scale: Normal     Standing balance support: No upper extremity supported Standing balance-Leahy Scale: Fair                              Cognition Arousal/Alertness: Awake/alert Behavior During Therapy: WFL for tasks assessed/performed Overall Cognitive Status: Within Functional Limits for tasks assessed                                 General Comments: More alert today and answering all questions.      Exercises Other Exercises Other Exercises: Standing march, knee flexion, mini squat, toe lifts, heel lifts x20 each. Other Exercises: Dynamic gait: side stepping, tandem walk x4 laps along counter. Other Exercises: seated: leanbacks, leanbacks with trunk rotation, marching, heel lifts, toe lifts, pillow squeeze x10 with pt appearing to become fatigued and lose interest in ther ex during seated exercises. Other Exercises: Time for toileting x4 min  General Comments        Pertinent Vitals/Pain Pain Assessment: No/denies pain    Home Living                      Prior Function            PT Goals (current goals can now be found in the care plan section) Acute Rehab PT Goals Patient Stated Goal: To be able to get out and obtain his own groceries again as soon as possible. PT Goal Formulation: With patient Time For Goal Achievement: 12/08/18 Potential to Achieve Goals: Good    Frequency    Min 2X/week      PT Plan Current plan remains appropriate     Co-evaluation              AM-PAC PT "6 Clicks" Mobility   Outcome Measure  Help needed turning from your back to your side while in a flat bed without using bedrails?: A Little Help needed moving from lying on your back to sitting on the side of a flat bed without using bedrails?: A Little Help needed moving to and from a bed to a chair (including a wheelchair)?: A Little Help needed standing up from a chair using your arms (e.g., wheelchair or bedside chair)?: A Little Help needed to walk in hospital room?: A Little Help needed climbing 3-5 steps with a railing? : A Little 6 Click Score: 18    End of Session Equipment Utilized During Treatment: Gait belt Activity Tolerance: Patient tolerated treatment well;Patient limited by fatigue Patient left: in bed;with call bell/phone within reach;with bed alarm set   PT Visit Diagnosis: Unsteadiness on feet (R26.81);Muscle weakness (generalized) (M62.81);Difficulty in walking, not elsewhere classified (R26.2)     Time: [id number] PT Time Calculation (min) (ACUTE ONLY): 24 min  Charges:  $Therapeutic Exercise: 8-22 mins $Neuromuscular Re-education: 8-22 mins                     Glenetta HewSarah Ghada Abbett, PT, DPT    Glenetta HewSarah Lubna Stegeman 11/25/2018, 2:39 PM

## 2018-11-25 NOTE — Progress Notes (Signed)
Urology Inpatient Progress Note  Subjective: Marvin Ortega is a 68 y.o. male who is POD1 from cystoscopy with right ureteroscopy with laser lithotripsy and stent placement, as well as a partial cystolitholapaxy with Dr. Lonna CobbStoioff. Dr. Lonna CobbStoioff plans to complete the staged cystolitholapaxy next week.  Patient experienced multiple episodes of loose stool overnight; C diff assay positive. 6 bowel movements noted so far today; patient has had one episode of stool incontinence associated with this.  He reports abdominal pain only prior to bowel movements and when he feels his bladder is full. Foley in place draining pale pink urine.  No labs available; CBC and BMP pending. He is afebrile and normotensive.  Anti-infectives: Anti-infectives (From admission, onward)   Start     Dose/Rate Route Frequency Ordered Stop   11/25/18 1030  vancomycin (VANCOCIN) 50 mg/mL oral solution 125 mg     125 mg Oral 4 times daily 11/25/18 1015 12/05/18 0959   11/23/18 1007  ceFAZolin (ANCEF) 2-4 GM/100ML-% IVPB    Note to Pharmacy: Mikey BussingLewis, Cindy   : cabinet override      11/23/18 1007 11/23/18 1045   11/23/18 0943  ceFAZolin (ANCEF) IVPB 2g/100 mL premix     2 g 200 mL/hr over 30 Minutes Intravenous 30 min pre-op 11/23/18 0943 11/23/18 1055      Current Facility-Administered Medications  Medication Dose Route Frequency Provider Last Rate Last Dose  . famotidine (PEPCID) tablet 20 mg  20 mg Oral BID Riki AltesStoioff, Scott C, MD   20 mg at 11/25/18 0806  . finasteride (PROSCAR) tablet 5 mg  5 mg Oral Daily Stoioff, Scott C, MD   5 mg at 11/25/18 0806  . furosemide (LASIX) tablet 40 mg  40 mg Oral Daily Stoioff, Scott C, MD   40 mg at 11/25/18 0806  . lactated ringers infusion   Intravenous Continuous Riki AltesStoioff, Scott C, MD 75 mL/hr at 11/25/18 0811    . metoprolol succinate (TOPROL-XL) 24 hr tablet 50 mg  50 mg Oral Daily Stoioff, Scott C, MD   50 mg at 11/25/18 0806  . oxybutynin (DITROPAN) tablet 5 mg  5 mg Oral Q8H  PRN Stoioff, Scott C, MD   5 mg at 11/25/18 1015  . pantoprazole (PROTONIX) EC tablet 40 mg  40 mg Oral BID Riki AltesStoioff, Scott C, MD   40 mg at 11/25/18 0806  . potassium chloride SA (K-DUR) CR tablet 20 mEq  20 mEq Oral BID Riki AltesStoioff, Scott C, MD   20 mEq at 11/25/18 0806  . thiamine (VITAMIN B-1) tablet 250 mg  250 mg Oral Daily Stoioff, Scott C, MD   250 mg at 11/25/18 0806  . vancomycin (VANCOCIN) 50 mg/mL oral solution 125 mg  125 mg Oral QID Albina BilletShanlever, Charles M, RPH   125 mg at 11/25/18 1130   Objective: Vital signs in last 24 hours: Temp:  [98.3 F (36.8 C)-98.8 F (37.1 C)] 98.4 F (36.9 C) (09/17 1213) Pulse Rate:  [69-81] 69 (09/17 1213) Resp:  [16-18] 16 (09/17 1213) BP: (112-138)/(76-83) 112/78 (09/17 1213) SpO2:  [98 %-99 %] 99 % (09/17 1213)  Intake/Output from previous day: 09/16 0701 - 09/17 0700 In: 480 [P.O.:480] Out: 5125 [Urine:5125] Intake/Output this shift: Total I/O In: -  Out: 900 [Urine:900]  Physical Exam Vitals signs and nursing note reviewed.  Constitutional:      General: He is not in acute distress.    Appearance: He is not ill-appearing, toxic-appearing or diaphoretic.  HENT:     Head:  Normocephalic and atraumatic.  Abdominal:     General: There is no distension.     Palpations: Abdomen is soft.     Tenderness: There is no abdominal tenderness. There is no right CVA tenderness, left CVA tenderness, guarding or rebound.  Skin:    General: Skin is warm and dry.  Neurological:     Mental Status: He is alert and oriented to person, place, and time.  Psychiatric:        Mood and Affect: Mood normal.        Behavior: Behavior normal.    Lab Results:  Results for orders placed or performed during the hospital encounter of 11/23/18  C difficile quick scan w PCR reflex   Specimen: STOOL  Result Value Ref Range   C Diff antigen POSITIVE (A) NEGATIVE   C Diff toxin POSITIVE (A) NEGATIVE   C Diff interpretation Toxin producing C. difficile detected.     Assessment & Plan: Patient recovering well following surgery.  Stable from a urologic perspective.  We will transfer care to inpatient hospitalist for manage of his C. difficile infection.  Debroah Loop, PA-C 11/25/2018

## 2018-11-25 NOTE — H&P (View-Only) (Signed)
[employer] - [place] [facility]   PATIENT NAME: Marvin Ortega    MR#:  [id number]  DATE OF BIRTH:  [birth year]  DATE OF ADMISSION:  11/23/2018  PRIMARY CARE PHYSICIAN: Kandyce Rud, MD   REQUESTING/REFERRING PHYSICIAN: Stoiff  CHIEF COMPLAINT:  No chief complaint on file.   HISTORY OF PRESENT ILLNESS: Marvin Ortega  is a 68 y.o. male with a known history of BPH, GERD, Htn- was admitted to Urology service for bladder and ureteral stone procedure- s/p procedure and foley placement. Plan was to d/c with foley and follow in urology clinic for further procedure. Pt started watery diarrhea and Checked and found positive for C diff. He had so far 5 episodes today. Urology requested medicine to take over this pt as there is no active management by them currently. Pt. denies any fever or abd pain. Able to eat fine.  PAST MEDICAL HISTORY:   Past Medical History:  Diagnosis Date  . BPH (benign prostatic hyperplasia)   . Dieulafoy lesion of stomach   . Dyspnea    on exertion   . GERD (gastroesophageal reflux disease)   . GIB (gastrointestinal bleeding) 02/15/2018  . Hemorrhagic shock (HCC)   . History of kidney stones   . Hypertension   . Pre-diabetes   . Upper GI bleed     PAST SURGICAL HISTORY:  Past Surgical History:  Procedure Laterality Date  . CYSTOSCOPY W/ RETROGRADES Right 11/23/2018   Procedure: CYSTOSCOPY WITH RETROGRADE PYELOGRAM;  Surgeon: Riki Altes, MD;  Location: [facility] ORS;  Service: Urology;  Laterality: Right;  . CYSTOSCOPY WITH LITHOLAPAXY N/A 11/23/2018   Procedure: CYSTOSCOPY WITH LITHOLAPAXY;  Surgeon: Riki Altes, MD;  Location: [facility] OR[facility];  Service: Urology;  Laterality: N/A;  . CYSTOSCOPY/URETEROSCOPY/HOLMIUM LASER/STENT PLACEMENT Right 11/23/2018   Procedure: CYSTOSCOPY/URETEROSCOPY/HOLMIUM LASER/STENT PLACEMENT;  Surgeon: Riki Altes, MD;  Location: [facility] OR[facility];  Service: Urology;  Laterality: Right;  .  ESOPHAGOGASTRODUODENOSCOPY N/A 02/15/2018   Procedure: ESOPHAGOGASTRODUODENOSCOPY (EGD);  Surgeon: Toney Reil, MD;  Location: [facility];  Service: Gastroenterology;  Laterality: N/A;    SOCIAL HISTORY:  Social History   Tobacco Use  . Smoking status: Never Smoker  . Smokeless tobacco: Never Used  Substance Use Topics  . Alcohol use: Yes    FAMILY HISTORY:  Family History  Problem Relation Age of Onset  . Heart disease Father   . Aneurysm Sister     DRUG ALLERGIES: No Known Allergies  REVIEW OF SYSTEMS:   CONSTITUTIONAL: No fever, fatigue or weakness.  EYES: No blurred or double vision.  EARS, NOSE, AND THROAT: No tinnitus or ear pain.  RESPIRATORY: No cough, shortness of breath, wheezing or hemoptysis.  CARDIOVASCULAR: No chest pain, orthopnea, edema.  GASTROINTESTINAL: No nausea, vomiting,have diarrhea ,no abdominal pain.  GENITOURINARY: No dysuria, hematuria.  ENDOCRINE: No polyuria, nocturia,  HEMATOLOGY: No anemia, easy bruising or bleeding SKIN: No rash or lesion. MUSCULOSKELETAL: No joint pain or arthritis.   NEUROLOGIC: No tingling, numbness, weakness.  PSYCHIATRY: No anxiety or depression.   MEDICATIONS AT HOME:  Prior to Admission medications   Medication Sig Start Date End Date Taking? Authorizing Provider  famotidine (PEPCID) 20 MG tablet Take 20 mg by mouth 2 (two) times daily. 02/10/18  Yes [provider]  finasteride (PROSCAR) 5 MG tablet Take 5 mg by mouth daily.   Yes [provider]  furosemide (LASIX) 40 MG tablet TAKE 1 TABLET BY MOUTH EVERY DAY 09/23/18  Yes [provider]  metoprolol  succinate (TOPROL-XL) 50 MG 24 hr tablet Take 100 mg by mouth daily. 02/13/18  Yes [provider]  pantoprazole (PROTONIX) 40 MG tablet Take 1 tablet (40 mg total) by mouth 2 (two) times daily. 02/19/18  Yes Mayo, Allyn KennerKaty Dodd, MD  Potassium Chloride ER 20 MEQ TBCR TAKE 1 TABLET BY MOUTH TWICE A DAY 09/23/18  Yes [provider]  silodosin (RAPAFLO) 8 MG CAPS capsule Take 8 mg by mouth daily. 01/26/18  Yes [provider]  tamsulosin (FLOMAX) 0.4 MG CAPS capsule Take 0.4 mg by mouth daily.  05/06/18 05/06/19 Yes [provider]  Thiamine HCl (VITAMIN B-1) 250 MG tablet Take 250 mg by mouth daily.   Yes [provider]      PHYSICAL EXAMINATION:   VITAL SIGNS: Blood pressure 112/78, pulse 69, temperature 98.4 F (36.9 C), temperature source Oral, resp. rate 16, height 5\' 6"  (1.676 m), weight 69.5 kg, SpO2 99 %.  GENERAL:  68 y.o.-year-old patient lying in the bed with no acute distress.  EYES: Pupils equal, round, reactive to light and accommodation. No scleral icterus. Extraocular muscles intact.  HEENT: Head atraumatic, normocephalic. Oropharynx and nasopharynx clear.  NECK:  Supple, no jugular venous distention. No thyroid enlargement, no tenderness.  LUNGS: Normal breath sounds bilaterally, no wheezing, rales,rhonchi or crepitation. No use of accessory muscles of respiration.  CARDIOVASCULAR: S1, S2 normal. No murmurs, rubs, or gallops.  ABDOMEN: Soft, nontender, nondistended. Bowel sounds present. No organomegaly or mass. Foley in place. EXTREMITIES: No pedal edema, cyanosis, or clubbing.  NEUROLOGIC: Cranial nerves II through XII are intact. Muscle strength 5/5 in all extremities. Sensation intact. Gait not checked.  PSYCHIATRIC: The patient is alert and oriented x 3.  SKIN: No obvious rash, lesion, or ulcer.   LABORATORY PANEL:   CBC Recent Labs  Lab 11/25/18 1332  WBC 9.7  HGB 8.8*  HCT 27.4*  PLT 233  MCV 88.1  MCH 28.3  MCHC 32.1  RDW 15.8*   ------------------------------------------------------------------------------------------------------------------  Chemistries  No results for input(s): NA, K, CL, CO2, GLUCOSE, BUN, CREATININE, CALCIUM, MG, AST, ALT, ALKPHOS, BILITOT in the last 168 hours.  Invalid input(s):  GFRCGP ------------------------------------------------------------------------------------------------------------------ CrCl cannot be calculated (Patient's most recent lab result is older than the maximum 21 days allowed.). ------------------------------------------------------------------------------------------------------------------ No results for input(s): TSH, T4TOTAL, T3FREE, THYROIDAB in the last 72 hours.  Invalid input(s): FREET3   Coagulation profile No results for input(s): INR, PROTIME in the last 168 hours. ------------------------------------------------------------------------------------------------------------------- No results for input(s): DDIMER in the last 72 hours. -------------------------------------------------------------------------------------------------------------------  Cardiac Enzymes No results for input(s): CKMB, TROPONINI, MYOGLOBIN in the last 168 hours.  Invalid input(s): CK ------------------------------------------------------------------------------------------------------------------ Invalid input(s): POCBNP  ---------------------------------------------------------------------------------------------------------------  Urinalysis    Component Value Date/Time   APPEARANCEUR Hazy (A) 10/29/2018 0909   GLUCOSEU Negative 10/29/2018 0909   BILIRUBINUR Negative 10/29/2018 0909   PROTEINUR Negative 10/29/2018 0909   NITRITE Negative 10/29/2018 0909   LEUKOCYTESUR Negative 10/29/2018 0909     RADIOLOGY: No results found.  EKG: Orders placed or performed during the hospital encounter of 11/12/18  . EKG 12 lead  . EKG 12 lead    IMPRESSION AND PLAN:  * C diff diarrhea   Oral vanc cont.   IV fluids for hydration.    Monitor.  * Ureteral stone.   S/p procedure for stone removal, foley in place- need to discharge home with foley and f/up in urology clinic.   Urology to cont to follow while inpatient.  * GERD  Cont PPI.  *  Chronic anemia   Check Iron.  * CKD stage 3   Monitor with IV fluids.  PT had suggested HHAPT on discharge.  All the records are reviewed and case discussed with ED provider. Management plans discussed with the patient, family and they are in agreement.  CODE STATUS: Code Status History    Date Active Date Inactive Code Status Order ID Comments User Context   02/15/2018 2059 02/19/2018 1612 Full Code [id number]  SalaryAvel Peace, MD Inpatient   Advance Care Planning Activity       TOTAL TIME TAKING CARE OF THIS PATIENT: 45 minutes.    Vaughan Basta M.D on 11/25/2018   Between 7am to 6pm - Pager - [phone]  After 6pm go to [email or web address] - password [email or web address] [email or web address]  Office  [phone]  CC: Primary care physician; Derinda Late, MD   Note: This dictation was prepared with Dragon dictation along with smaller phrase technology. Any transcriptional errors that result from this process are unintentional.

## 2018-11-26 ENCOUNTER — Other Ambulatory Visit: Admission: AD | Admit: 2018-11-26 | Payer: Medicare HMO | Source: Home / Self Care | Admitting: Urology

## 2018-11-26 MED ORDER — ENOXAPARIN SODIUM 40 MG/0.4ML ~~LOC~~ SOLN
40.0000 mg | SUBCUTANEOUS | Status: DC
  Administered 2018-11-26 – 2018-11-28 (×3): 40 mg via SUBCUTANEOUS
  Filled 2018-11-26 (×3): qty 0.4

## 2018-11-26 MED ORDER — FERROUS SULFATE 325 (65 FE) MG PO TABS
325.0000 mg | ORAL_TABLET | Freq: Two times a day (BID) | ORAL | Status: DC
  Administered 2018-11-26 – 2018-11-28 (×4): 325 mg via ORAL
  Filled 2018-11-26 (×4): qty 1

## 2018-11-26 MED ORDER — SODIUM CHLORIDE 0.9 % IV SOLN
INTRAVENOUS | Status: DC
  Administered 2018-11-26 – 2018-11-27 (×2): via INTRAVENOUS

## 2018-11-26 NOTE — Progress Notes (Signed)
Urology Inpatient Progress Note  Subjective: Marvin Ortega is a 68 y.o. male who is POD2 from cystoscopy with right ureteroscopy with laser lithotripsy and stent placement, as well as a partial cystolitholapaxy with Dr. Bernardo Heater.  Dr. Bernardo Heater plans to complete the staged cystolitholapaxy next week.  Patient was started on PO vancomycin for C. difficile infection yesterday.  11 total bowel movements yesterday, none so far this morning.  Creatinine 1.25, WBC count 9.7.  He remains afebrile and normotensive.  He reports feeling well today.  He denies abdominal pain.  Foley in place draining pink-tinged urine.  Anti-infectives: Anti-infectives (From admission, onward)   Start     Dose/Rate Route Frequency Ordered Stop   11/25/18 1030  vancomycin (VANCOCIN) 50 mg/mL oral solution 125 mg     125 mg Oral 4 times daily 11/25/18 1015 12/05/18 0959   11/23/18 1007  ceFAZolin (ANCEF) 2-4 GM/100ML-% IVPB    Note to Pharmacy: Sylvester Harder   : cabinet override      11/23/18 1007 11/23/18 1045   11/23/18 0943  ceFAZolin (ANCEF) IVPB 2g/100 mL premix     2 g 200 mL/hr over 30 Minutes Intravenous 30 min pre-op 11/23/18 0943 11/23/18 1055      Current Facility-Administered Medications  Medication Dose Route Frequency Provider Last Rate Last Dose  . 0.9 %  sodium chloride infusion   Intravenous Continuous Vaughan Basta, MD 50 mL/hr at 11/26/18 0914    . enoxaparin (LOVENOX) injection 40 mg  40 mg Subcutaneous Q24H Epifanio Lesches, MD   40 mg at 11/26/18 0912  . famotidine (PEPCID) tablet 20 mg  20 mg Oral BID John Giovanni C, MD   20 mg at 11/26/18 0854  . finasteride (PROSCAR) tablet 5 mg  5 mg Oral Daily Stoioff, Scott C, MD   5 mg at 11/26/18 0855  . furosemide (LASIX) tablet 40 mg  40 mg Oral Daily Stoioff, Scott C, MD   40 mg at 11/26/18 0855  . lactated ringers infusion   Intravenous Continuous Abbie Sons, MD 75 mL/hr at 11/25/18 0811    . metoprolol succinate  (TOPROL-XL) 24 hr tablet 50 mg  50 mg Oral Daily Stoioff, Scott C, MD   50 mg at 11/26/18 0855  . oxybutynin (DITROPAN) tablet 5 mg  5 mg Oral Q8H PRN Stoioff, Scott C, MD   5 mg at 11/26/18 0900  . pantoprazole (PROTONIX) EC tablet 40 mg  40 mg Oral BID John Giovanni C, MD   40 mg at 11/26/18 0854  . potassium chloride SA (K-DUR) CR tablet 20 mEq  20 mEq Oral BID John Giovanni C, MD   20 mEq at 11/26/18 0855  . thiamine (VITAMIN B-1) tablet 250 mg  250 mg Oral Daily Stoioff, Scott C, MD   250 mg at 11/26/18 0854  . vancomycin (VANCOCIN) 50 mg/mL oral solution 125 mg  125 mg Oral QID Lu Duffel, RPH   125 mg at 11/26/18 0912     Objective: Vital signs in last 24 hours: Temp:  [98.4 F (36.9 C)] 98.4 F (36.9 C) (09/18 0537) Pulse Rate:  [69-81] 74 (09/18 0854) Resp:  [16-20] 20 (09/18 0537) BP: (112-134)/(78-85) 126/78 (09/18 0854) SpO2:  [98 %-99 %] 99 % (09/18 0537)  Intake/Output from previous day: 09/17 0701 - 09/18 0700 In: -  Out: 2650 [Urine:2650] Intake/Output this shift: No intake/output data recorded.  Physical Exam Vitals signs and nursing note reviewed.  Constitutional:      General:  He is awake. He is not in acute distress.    Appearance: He is not ill-appearing, toxic-appearing or diaphoretic.  HENT:     Head: Normocephalic and atraumatic.  Pulmonary:     Effort: Pulmonary effort is normal. No respiratory distress.  Abdominal:     General: There is no distension.     Palpations: Abdomen is soft.     Tenderness: There is no abdominal tenderness. There is no guarding or rebound.  Skin:    General: Skin is warm and dry.  Neurological:     Mental Status: He is alert.  Psychiatric:        Mood and Affect: Mood normal.        Behavior: Behavior normal.    Lab Results:  Recent Labs    11/25/18 1332  WBC 9.7  HGB 8.8*  HCT 27.4*  PLT 233   BMET Recent Labs    11/25/18 1332  NA 134*  K 3.8  CL 99  CO2 23  GLUCOSE 151*  BUN 14   CREATININE 1.25*  CALCIUM 8.6*   Assessment & Plan: Patient recovering well, stable from a urological perspective. Plan to keep Foley until scheduled staged cystolitholapaxy next week. No new recommendations today.  Carman ChingSamantha Onyekachi Gathright, PA-C 11/26/2018

## 2018-11-26 NOTE — Progress Notes (Signed)
The patient had 9 small bowel movements during the day shift. Oral vanc provided. IV fluids running. Urine pink tinged color.

## 2018-11-26 NOTE — Progress Notes (Signed)
[place] [facility] [place] NAME: Marvin Ortega    MR#:  [id number]  DATE OF BIRTH:  January 03, 1951  SUBJECTIVE: Seen at bedside, admitted to medical service secondary to C. difficile colitis, had 11 episodes of diarrhea yesterday and 3 this morning, on floor vancomycin.  No abdominal pain, oral intake is.,  Denies any other complaints.  CHIEF COMPLAINT:  No chief complaint on file.   REVIEW OF SYSTEMS:   ROS CONSTITUTIONAL: No fever, fatigue or weakness.  EYES: No blurred or double vision.  EARS, NOSE, AND THROAT: No tinnitus or ear pain.  RESPIRATORY: No cough, shortness of breath, wheezing or hemoptysis.  CARDIOVASCULAR: No chest pain, orthopnea, edema.  GASTROINTESTINAL: N no nausea or vomiting.  Has diarrhea, 3 loose stools since morning as per nurse GENITOURINARY: No dysuria, hematuria.  ENDOCRINE: No polyuria, nocturia,  HEMATOLOGY: No anemia, easy bruising or bleeding SKIN: No rash or lesion. MUSCULOSKELETAL: No joint pain or arthritis.   NEUROLOGIC: No tingling, numbness, weakness.  PSYCHIATRY: No anxiety or depression.   DRUG ALLERGIES:  No Known Allergies  VITALS:  Blood pressure 113/73, pulse 77, temperature 98.4 F (36.9 C), temperature source Oral, resp. rate 19, height 5\' 6"  (1.676 m), weight 69.5 kg, SpO2 98 %.  PHYSICAL EXAMINATION:  GENERAL:  68 y.o.-year-old patient lying in the bed with no acute distress.  EYES: Pupils equal, round, reactive to light and accommodation. No scleral icterus. Extraocular muscles intact.  HEENT: Head atraumatic, normocephalic. Oropharynx and nasopharynx clear.  NECK:  Supple, no jugular venous distention. No thyroid enlargement, no tenderness.  LUNGS: Normal breath sounds bilaterally, no wheezing, rales,rhonchi or crepitation. No use of accessory muscles of respiration.  CARDIOVASCULAR: S1, S2 normal. No murmurs, rubs, or gallops.  ABDOMEN: Soft, nontender, nondistended. Bowel sounds  present. No organomegaly or mass.  EXTREMITIES: No pedal edema, cyanosis, or clubbing.  NEUROLOGIC: Cranial nerves II through XII are intact. Muscle strength 5/5 in all extremities. Sensation intact. Gait not checked.  PSYCHIATRIC: The patient is alert and oriented x 3.  SKIN: No obvious rash, lesion, or ulcer.    LABORATORY PANEL:   CBC Recent Labs  Lab 11/25/18 1332  WBC 9.7  HGB 8.8*  HCT 27.4*  PLT 233   ------------------------------------------------------------------------------------------------------------------  Chemistries  Recent Labs  Lab 11/25/18 1332  NA 134*  K 3.8  CL 99  CO2 23  GLUCOSE 151*  BUN 14  CREATININE 1.25*  CALCIUM 8.6*   ------------------------------------------------------------------------------------------------------------------  Cardiac Enzymes No results for input(s): TROPONINI in the last 168 hours. ------------------------------------------------------------------------------------------------------------------  RADIOLOGY:  No results found.  EKG:   Orders placed or performed during the hospital encounter of 11/12/18  . EKG 12 lead  . EKG 12 lead    ASSESSMENT AND PLAN:   68 year old male with a postop day 2 of cystoscopy and right ureteroscopy with laser lithotripsy status post stent placement, patient supposed to have stagedcystolitholapaxy by Dr.  Bernardo Heater next week  #1. C. difficile colitis, diarrhea episodes are decreasing but still has diarrhea, continue oral vancomycin for total 14 days   2/. ureteral stone status post right ureteroscopy, laser lithotripsy, stent placement by urology, discharge with Foley when ready, appreciate urology following the patient   3/chronic iron deficiency anemia ;replace Iron.  4.  GERD: Continue PPI 5.  Essential hypertension, continue home antihypertensives.    all the records are reviewed and case discussed with Care Management/Social Workerr. Management plans discussed with  the patient, family and they  are in agreement.  CODE STATUS: Full code  TOTAL TIME TAKING CARE OF THIS PATIENT: 35 minutes.   POSSIBLE D/C IN 1-2 DAYS, DEPENDING ON CLINICAL CONDITION.   Katha HammingSnehalatha Drago Hammonds M.D on 11/26/2018 at 2:24 PM  Between 7am to 6pm - Pager - [phone]  After 6pm go to [email or web address] - password [email or web address] [email or web address]  [facility]  Office  [phone]  CC: Primary care physician; Kandyce RudBabaoff, [name], MD   Note: This dictation was prepared with Dragon dictation along with smaller phrase technology. Any transcriptional errors that result from this process are unintentional.

## 2018-11-27 LAB — URINE CULTURE: Culture: NO GROWTH

## 2018-11-27 NOTE — Progress Notes (Signed)
[facility] [place] [facility]   PATIENT NAME: Marvin CossWallace Paternostro    MR#:  [id number]  DATE OF BIRTH:  [birth year]  SUBJECTIVE: Seen at bedside, admitted to medical service secondary to C. difficile colitis, had 11 episodes of diarrhea yesterday and 3 this morning, on floor vancomycin.  No abdominal pain, oral intake is.,  Denies any other complaints.  CHIEF COMPLAINT:  No chief complaint on file.  Patient states that he is not having any further diarrhea.  REVIEW OF SYSTEMS:   ROS CONSTITUTIONAL: No fever, fatigue or weakness.  EYES: No blurred or double vision.  EARS, NOSE, AND THROAT: No tinnitus or ear pain.  RESPIRATORY: No cough, shortness of breath, wheezing or hemoptysis.  CARDIOVASCULAR: No chest pain, orthopnea, edema.  GASTROINTESTINAL: N no nausea or vomiting.  Has diarrhea, 3 loose stools since morning as per nurse GENITOURINARY: No dysuria, hematuria.  ENDOCRINE: No polyuria, nocturia,  HEMATOLOGY: No anemia, easy bruising or bleeding SKIN: No rash or lesion. MUSCULOSKELETAL: No joint pain or arthritis.   NEUROLOGIC: No tingling, numbness, weakness.  PSYCHIATRY: No anxiety or depression.   DRUG ALLERGIES:  No Known Allergies  VITALS:  Blood pressure 114/73, pulse 65, temperature 98.4 F (36.9 C), temperature source Oral, resp. rate 20, height 5\' 6"  (1.676 m), weight 69.5 kg, SpO2 100 %.  PHYSICAL EXAMINATION:  GENERAL:  68 y.o.-year-old patient lying in the bed with no acute distress.  EYES: Pupils equal, round, reactive to light and accommodation. No scleral icterus. Extraocular muscles intact.  HEENT: Head atraumatic, normocephalic. Oropharynx and nasopharynx clear.  NECK:  Supple, no jugular venous distention. No thyroid enlargement, no tenderness.  LUNGS: Normal breath sounds bilaterally, no wheezing, rales,rhonchi or crepitation. No use of accessory muscles of respiration.  CARDIOVASCULAR: S1, S2 normal. No murmurs, rubs, or  gallops.  ABDOMEN: Soft, nontender, nondistended. Bowel sounds present. No organomegaly or mass.  EXTREMITIES: No pedal edema, cyanosis, or clubbing.  NEUROLOGIC: Cranial nerves II through XII are intact. Muscle strength 5/5 in all extremities. Sensation intact. Gait not checked.  PSYCHIATRIC: The patient is alert and oriented x 3.  SKIN: No obvious rash, lesion, or ulcer.    LABORATORY PANEL:   CBC Recent Labs  Lab 11/25/18 1332  WBC 9.7  HGB 8.8*  HCT 27.4*  PLT 233   ------------------------------------------------------------------------------------------------------------------  Chemistries  Recent Labs  Lab 11/25/18 1332  NA 134*  K 3.8  CL 99  CO2 23  GLUCOSE 151*  BUN 14  CREATININE 1.25*  CALCIUM 8.6*   ------------------------------------------------------------------------------------------------------------------  Cardiac Enzymes No results for input(s): TROPONINI in the last 168 hours. ------------------------------------------------------------------------------------------------------------------  RADIOLOGY:  No results found.  EKG:   Orders placed or performed during the hospital encounter of 11/12/18  . EKG 12 lead  . EKG 12 lead    ASSESSMENT AND PLAN:   68 year old male with a postop day 2 of cystoscopy and right ureteroscopy with laser lithotripsy status post stent placement, patient supposed to have stagedcystolitholapaxy by Dr.  Lonna CobbStoioff next week  #1. C. difficile colitis, improved will need oral vancomycin for 14 days  #2 ureteral stone status post right ureteroscopy, laser lithotripsy, stent placement by urology, discharge with Foley when ready, appreciate urology following the patient   #3 chronic iron deficiency anemia ;replace Iron.  #4 GERD: Continue PPI #5 essential hypertension, continue home antihypertensives.    all the records are reviewed and case discussed with Care Management/Social Workerr. Management plans  discussed with the patient, family and  they are in agreement.  CODE STATUS: Full code  TOTAL TIME TAKING CARE OF THIS PATIENT: 35 minutes.   POSSIBLE D/C IN 1-2 DAYS, DEPENDING ON CLINICAL CONDITION.   Dustin Flock M.D on 11/27/2018 at 1:06 PM  Between 7am to 6pm - Pager - [phone]  After 6pm go to [email or web address] - password [email or web address] [facility]  Office  [phone]  CC: Primary care physician; Derinda Late, MD   Note: This dictation was prepared with Dragon dictation along with smaller phrase technology. Any transcriptional errors that result from this process are unintentional.

## 2018-11-27 NOTE — Plan of Care (Addendum)
Patient is getting better.no fall ,antibiotic given,catheter in place ,pt had 3 BMs and  BMS are soft during the shift, IV fluid is running.  Problem: Education: Goal: Knowledge of General Education information will improve Description: Including pain rating scale, medication(s)/side effects and non-pharmacologic comfort measures 11/27/2018 1658 by Duayne Cal, RN Outcome: Progressing 11/27/2018 1657 by Duayne Cal, RN Outcome: Progressing   Problem: Health Behavior/Discharge Planning: Goal: Ability to manage health-related needs will improve 11/27/2018 1658 by Duayne Cal, RN Outcome: Progressing 11/27/2018 1657 by Duayne Cal, RN Outcome: Progressing   Problem: Clinical Measurements: Goal: Ability to maintain clinical measurements within normal limits will improve 11/27/2018 1658 by Duayne Cal, RN Outcome: Progressing 11/27/2018 1657 by Duayne Cal, RN Outcome: Progressing Goal: Will remain free from infection 11/27/2018 1658 by Duayne Cal, RN Outcome: Progressing 11/27/2018 1657 by Duayne Cal, RN Outcome: Progressing Goal: Diagnostic test results will improve 11/27/2018 1658 by Duayne Cal, RN Outcome: Progressing 11/27/2018 1657 by Duayne Cal, RN Outcome: Progressing Goal: Respiratory complications will improve 11/27/2018 1658 by Duayne Cal, RN Outcome: Progressing 11/27/2018 1657 by Duayne Cal, RN Outcome: Progressing Goal: Cardiovascular complication will be avoided 11/27/2018 1658 by Duayne Cal, RN Outcome: Progressing 11/27/2018 1657 by Duayne Cal, RN Outcome: Progressing   Problem: Activity: Goal: Risk for activity intolerance will decrease 11/27/2018 1658 by Duayne Cal, RN Outcome: Progressing 11/27/2018 1657 by Duayne Cal, RN Outcome: Progressing   Problem: Nutrition: Goal: Adequate nutrition will be maintained 11/27/2018 1658 by Duayne Cal, RN Outcome: Progressing 11/27/2018 1657 by Duayne Cal, RN Outcome: Progressing    Problem: Coping: Goal: Level of anxiety will decrease 11/27/2018 1658 by Duayne Cal, RN Outcome: Progressing 11/27/2018 1657 by Duayne Cal, RN Outcome: Progressing   Problem: Elimination: Goal: Will not experience complications related to bowel motility 11/27/2018 1658 by Duayne Cal, RN Outcome: Progressing 11/27/2018 1657 by Duayne Cal, RN Outcome: Progressing Goal: Will not experience complications related to urinary retention 11/27/2018 1658 by Duayne Cal, RN Outcome: Progressing 11/27/2018 1657 by Duayne Cal, RN Outcome: Progressing   Problem: Pain Managment: Goal: General experience of comfort will improve 11/27/2018 1658 by Duayne Cal, RN Outcome: Progressing 11/27/2018 1657 by Duayne Cal, RN Outcome: Progressing   Problem: Safety: Goal: Ability to remain free from injury will improve 11/27/2018 1658 by Duayne Cal, RN Outcome: Progressing 11/27/2018 1657 by Duayne Cal, RN Outcome: Progressing   Problem: Skin Integrity: Goal: Risk for impaired skin integrity will decrease 11/27/2018 1658 by Duayne Cal, RN Outcome: Progressing 11/27/2018 1657 by Duayne Cal, RN Outcome: Progressing

## 2018-11-28 MED ORDER — OXYBUTYNIN CHLORIDE 5 MG PO TABS: 5.0000 mg | ORAL_TABLET | Freq: Three times a day (TID) | ORAL | 0 refills | Status: DC | PRN

## 2018-11-28 MED ORDER — VANCOMYCIN 50 MG/ML ORAL SOLUTION: 125.0000 mg | Freq: Four times a day (QID) | ORAL | 0 refills | Status: AC

## 2018-11-28 NOTE — TOC Transition Note (Signed)
Transition of Care [facility]) - CM/SW Discharge Note   Patient Details  Name: Marvin Ortega MRN: [id number] Date of Birth: 07-19-1950  Transition of Care [facility]) CM/SW Contact:  Katrina Stack, RN Phone Number: 11/28/2018, 9:59 AM   Clinical Narrative:   Patient for discharge home today.  Notified Helene Kelp with [facility] and obtained order for RN PT OT Aide. [facility] pharmacy will provide the Vancomycin solution.    Final next level of care: [place] Barriers to Discharge: Continued Medical Work up   Patient Goals and CMS Choice Patient states their goals for this hospitalization and ongoing recovery are:: To go home and for my breathing to get better- patient has mentioned wheezing to his PCP since November CMS Medicare.gov Compare Post Acute Care list provided to:: Patient Choice offered to / list presented to : Patient  Discharge Placement                       Discharge Plan and Services   Discharge Planning Services: CM Consult Post Acute Care Choice: Home Health                    HH Arranged: RN, PT, OT, Nurse's Aide [place] Agency: [facility] [employer]) Date [place]: 11/28/18 Time [place]: [phone] Representative spoke with at [place]: [place] (SDOH) Interventions     Readmission Risk Interventions No flowsheet data found.

## 2018-11-28 NOTE — Progress Notes (Signed)
Patient went home accompanied his niece. IV was removed. Patent was educated ab how to take care of the catheter at home, he also  Understood the discharged instruction including the medications.

## 2018-11-28 NOTE — Plan of Care (Signed)

## 2018-11-28 NOTE — Discharge Summary (Signed)
[facility] - [place] at [facility]  Marvin Ortega, 68 y.o., DOB Sep 12, 1950, MRN [id number]. Admission date: 11/23/2018 Discharge Date 11/28/2018 Primary MD Kandyce Rud, MD Admitting Physician Riki Altes, MD  Admission Diagnosis  bladder calculus,right ureteral calculi  Discharge Diagnosis   Active Problems: C. difficile colitis Ureteral stone status post right ureteroscopy and laser lithotripsy and stent placement Chronic iron deficiency anemia GERD Essential hypertension    Hospital Course  Patient is 68 year old who was postop day due to of cystoscopy and right ureteroscopy with laser lithotripsy with stent placement who started having diarrhea.  He was noted to have C. difficile colitis.  Patient was started on vancomycin.  With this treatment states diarrhea is much better.  Patient is doing better.  He is stable for discharge with outpatient follow-up.            Consults  urology  Significant Tests:  See full reports for all details     Dg Or Urology Cysto Image ([facility] Only)  Result Date: 11/23/2018 There is no interpretation for this exam.  This order is for images obtained during a surgical procedure.  Please See "Surgeries" Tab for more information regarding the procedure.       Today   Subjective:   Marvin Ortega patient's diarrhea is much improved. Objective:   Blood pressure 116/81, pulse 70, temperature 99.3 F (37.4 C), temperature source Oral, resp. rate 16, height 5\' 6"  (1.676 m), weight 69.5 kg, SpO2 99 %.  .  Intake/Output Summary (Last 24 hours) at 11/28/2018 1134 Last data filed at 11/28/2018 0800 Gross per 24 hour  Intake 1451.29 ml  Output 2250 ml  Net -798.71 ml    Exam VITAL SIGNS: Blood pressure 116/81, pulse 70, temperature 99.3 F (37.4 C), temperature source Oral, resp. rate 16, height 5\' 6"  (1.676 m), weight 69.5 kg, SpO2 99 %.  GENERAL:  68 y.o.-year-old patient lying in the bed with no acute  distress.  EYES: Pupils equal, round, reactive to light and accommodation. No scleral icterus. Extraocular muscles intact.  HEENT: Head atraumatic, normocephalic. Oropharynx and nasopharynx clear.  NECK:  Supple, no jugular venous distention. No thyroid enlargement, no tenderness.  LUNGS: Normal breath sounds bilaterally, no wheezing, rales,rhonchi or crepitation. No use of accessory muscles of respiration.  CARDIOVASCULAR: S1, S2 normal. No murmurs, rubs, or gallops.  ABDOMEN: Soft, nontender, nondistended. Bowel sounds present. No organomegaly or mass.  EXTREMITIES: No pedal edema, cyanosis, or clubbing.  NEUROLOGIC: Cranial nerves II through XII are intact. Muscle strength 5/5 in all extremities. Sensation intact. Gait not checked.  PSYCHIATRIC: The patient is alert and oriented x 3.  SKIN: No obvious rash, lesion, or ulcer.   Data Review     CBC w Diff:  Lab Results  Component Value Date   WBC 9.7 11/25/2018   HGB 8.8 (L) 11/25/2018   HGB 11.6 (L) 03/29/2018   HCT 27.4 (L) 11/25/2018   HCT 32.9 (L) 03/29/2018   PLT 233 11/25/2018   PLT 153 03/29/2018   LYMPHOPCT 17 02/17/2018   MONOPCT 8 02/17/2018   EOSPCT 0 02/17/2018   BASOPCT 0 02/17/2018   CMP:  Lab Results  Component Value Date   NA 134 (L) 11/25/2018   NA 139 03/29/2018   K 3.8 11/25/2018   CL 99 11/25/2018   CO2 23 11/25/2018   BUN 14 11/25/2018   BUN 14 03/29/2018   CREATININE 1.25 (H) 11/25/2018   GLU 101 06/02/2013   PROT 6.7  03/29/2018   ALBUMIN 3.3 (L) 03/29/2018   BILITOT 0.8 03/29/2018   ALKPHOS 118 (H) 03/29/2018   AST 28 03/29/2018   ALT 16 03/29/2018  .  Micro Results Recent Results (from the past 240 hour(s))  SARS CORONAVIRUS 2 (TAT 6-24 HRS) Nasopharyngeal Nasopharyngeal Swab     Status: None   Collection Time: 11/19/18  8:39 AM   Specimen: Nasopharyngeal Swab  Result Value Ref Range Status   SARS Coronavirus 2 NEGATIVE NEGATIVE Final    Comment: (NOTE) SARS-CoV-2 target nucleic  acids are NOT DETECTED. The SARS-CoV-2 RNA is generally detectable in upper and lower respiratory specimens during the acute phase of infection. Negative results do not preclude SARS-CoV-2 infection, do not rule out co-infections with other pathogens, and should not be used as the sole basis for treatment or other patient management decisions. Negative results must be combined with clinical observations, patient history, and epidemiological information. The expected result is Negative. Fact Sheet for Patients: HairSlick.nohttps://www.fda.gov/media/138098/download Fact Sheet for Healthcare Providers: quierodirigir.comhttps://www.fda.gov/media/138095/download This test is not yet approved or cleared by the [place] FDA and  has been authorized for detection and/or diagnosis of SARS-CoV-2 by FDA under an Emergency Use Authorization (EUA). This EUA will remain  in effect (meaning this test can be used) for the duration of the COVID-19 declaration under Section 56 4(b)(1) of the Act, 21 U.S.C. section 360bbb-3(b)(1), unless the authorization is terminated or revoked sooner. Performed at [facility], [address] [place], [place] [address]   C difficile quick scan w PCR reflex     Status: Abnormal   Collection Time: 11/24/18  4:32 PM   Specimen: STOOL  Result Value Ref Range Status   C Diff antigen POSITIVE (A) NEGATIVE Final   C Diff toxin POSITIVE (A) NEGATIVE Final   C Diff interpretation Toxin producing C. difficile detected.  Final    Comment: CRITICAL RESULT CALLED TO, READ BACK BY AND VERIFIED WITH: JORGE MORALES 11/24/18 @ 1849  MLK Performed at [facility], [address] [place], [place] [address]   Urine Culture     Status: None   Collection Time: 11/26/18 11:51 AM   Specimen: Urine, Random  Result Value Ref Range Status   Specimen Description   Final    URINE, RANDOM Performed at [facility], [address] [place], [place] [address]    Special Requests    Final    NONE Performed at [facility], [address] [place], [place] [address]    Culture   Final    NO GROWTH Performed at [facility], [address] [place], [place] [address]    Report Status 11/27/2018 FINAL  Final     Code Status History    Date Active Date Inactive Code Status Order ID Comments User Context   02/15/2018 2059 02/19/2018 1612 Full Code [id number]  SalaryEvelena Asa, Montell D, MD Inpatient   Advance Care Planning Activity          Follow-up Information    Kandyce RudBabaoff, [name], MD Follow up in 6 day(s).   Specialty: Family Medicine Contact information: [id number] [facility] [place] [phone]        Riki AltesStoioff, Scott C, MD Follow up in 2 day(s).   Specialty: Urology Why: hosp f/u need further procedures Contact information: [address] [place] [address] [phone]  Discharge Medications   Allergies as of 11/28/2018   No Known Allergies     Medication List    TAKE these medications   famotidine 20 MG tablet Commonly known as: PEPCID Take 20 mg by mouth 2 (two) times daily.   finasteride 5 MG tablet Commonly known as: PROSCAR Take 5 mg by mouth daily.   furosemide 40 MG tablet Commonly known as: LASIX TAKE 1 TABLET BY MOUTH EVERY DAY   metoprolol succinate 50 MG 24 hr tablet Commonly known as: TOPROL-XL Take 100 mg by mouth daily.   oxybutynin 5 MG tablet Commonly known as: DITROPAN Take 1 tablet (5 mg total) by mouth every 8 (eight) hours as needed for bladder spasms.   pantoprazole 40 MG tablet Commonly known as: PROTONIX Take 1 tablet (40 mg total) by mouth 2 (two) times daily.   Potassium Chloride ER 20 MEQ Tbcr TAKE 1 TABLET BY MOUTH TWICE A DAY   silodosin 8 MG Caps capsule Commonly known as: RAPAFLO Take 8 mg by mouth daily.   tamsulosin 0.4 MG Caps capsule Commonly known as: FLOMAX Take 0.4 mg by mouth  daily.   vancomycin 50 mg/mL  oral solution Commonly known as: VANCOCIN Take 2.5 mLs (125 mg total) by mouth 4 (four) times daily for 7 days.   vitamin B-1 250 MG tablet Take 250 mg by mouth daily.          Total Time in preparing paper work, data evaluation and todays exam - 57 minutes  Dustin Flock M.D on 11/28/2018 at 11:34 AM [place]  [phone]

## 2018-11-29 ENCOUNTER — Other Ambulatory Visit: Payer: Self-pay

## 2018-11-29 ENCOUNTER — Other Ambulatory Visit
Admission: RE | Admit: 2018-11-29 | Discharge: 2018-11-29 | Disposition: A | Discharge status: Home / Self Care | Payer: Medicare HMO | Source: Ambulatory Visit | Attending: Urology | Admitting: Urology

## 2018-11-29 DIAGNOSIS — Z01812 Encounter for preprocedural laboratory examination: Secondary | ICD-10-CM | POA: Diagnosis present

## 2018-11-29 DIAGNOSIS — Z20828 Contact with and (suspected) exposure to other viral communicable diseases: Secondary | ICD-10-CM | POA: Insufficient documentation

## 2018-11-29 LAB — SARS CORONAVIRUS 2 (TAT 6-24 HRS): SARS Coronavirus 2: NEGATIVE

## 2018-11-29 NOTE — Pre-Procedure Instructions (Signed)
Faxed request for doctor's orders and H&P to Dr. Dene Gentry office.  Date of surgery - 11/30/18.

## 2018-11-30 ENCOUNTER — Ambulatory Visit: Payer: Medicare HMO | Admitting: Anesthesiology

## 2018-11-30 ENCOUNTER — Other Ambulatory Visit: Payer: Self-pay

## 2018-11-30 ENCOUNTER — Encounter: Payer: Self-pay | Admitting: Anesthesiology

## 2018-11-30 ENCOUNTER — Encounter
Admission: RE | Disposition: A | Discharge status: Home Health | Payer: Self-pay | Source: Home / Self Care | Attending: Urology

## 2018-11-30 ENCOUNTER — Observation Stay
Admission: RE | Admit: 2018-11-30 | Discharge: 2018-12-01 | Disposition: A | Discharge status: Home Health | Payer: Medicare HMO | Attending: Urology | Admitting: Urology

## 2018-11-30 DIAGNOSIS — D649 Anemia, unspecified: Secondary | ICD-10-CM | POA: Insufficient documentation

## 2018-11-30 DIAGNOSIS — N210 Calculus in bladder: Principal | ICD-10-CM | POA: Diagnosis present

## 2018-11-30 DIAGNOSIS — Z466 Encounter for fitting and adjustment of urinary device: Secondary | ICD-10-CM | POA: Insufficient documentation

## 2018-11-30 DIAGNOSIS — K219 Gastro-esophageal reflux disease without esophagitis: Secondary | ICD-10-CM | POA: Diagnosis not present

## 2018-11-30 DIAGNOSIS — Z79899 Other long term (current) drug therapy: Secondary | ICD-10-CM | POA: Insufficient documentation

## 2018-11-30 DIAGNOSIS — A0472 Enterocolitis due to Clostridium difficile, not specified as recurrent: Secondary | ICD-10-CM | POA: Diagnosis not present

## 2018-11-30 DIAGNOSIS — Z87442 Personal history of urinary calculi: Secondary | ICD-10-CM | POA: Insufficient documentation

## 2018-11-30 DIAGNOSIS — I129 Hypertensive chronic kidney disease with stage 1 through stage 4 chronic kidney disease, or unspecified chronic kidney disease: Secondary | ICD-10-CM | POA: Diagnosis not present

## 2018-11-30 DIAGNOSIS — N401 Enlarged prostate with lower urinary tract symptoms: Secondary | ICD-10-CM | POA: Diagnosis not present

## 2018-11-30 DIAGNOSIS — N183 Chronic kidney disease, stage 3 (moderate): Secondary | ICD-10-CM | POA: Insufficient documentation

## 2018-11-30 HISTORY — PX: CYSTOSCOPY WITH LITHOLAPAXY: SHX1425

## 2018-11-30 HISTORY — PX: CYSTOSCOPY WITH HOLMIUM LASER LITHOTRIPSY: SHX6639

## 2018-11-30 LAB — CREATININE, SERUM
Creatinine, Ser: 1.07 mg/dL (ref 0.61–1.24)
GFR calc Af Amer: >60 mL/min (ref >60)
GFR calc non Af Amer: >60 mL/min (ref >60)

## 2018-11-30 LAB — CBC
HCT: 29.6 % — ABNORMAL LOW (ref 39.0–52.0)
Hemoglobin: 9.2 g/dL — ABNORMAL LOW (ref 13.0–17.0)
MCH: 28 pg (ref 26.0–34.0)
MCHC: 31.1 g/dL (ref 30.0–36.0)
MCV: 90 fL (ref 80.0–100.0)
Platelets: 211 10*3/uL (ref 150–400)
RBC: 3.29 MIL/uL — ABNORMAL LOW (ref 4.22–5.81)
RDW: 15.8 % — ABNORMAL HIGH (ref 11.5–15.5)
WBC: 4.5 10*3/uL (ref 4.0–10.5)
nRBC: 0 % (ref 0.0–0.2)

## 2018-11-30 SURGERY — CYSTOSCOPY, WITH BLADDER CALCULUS LITHOLAPAXY
Anesthesia: General

## 2018-11-30 MED ORDER — VITAMIN B-1 100 MG PO TABS
250.0000 mg | ORAL_TABLET | Freq: Every day | ORAL | Status: DC
  Administered 2018-11-30 – 2018-12-01 (×2): 250 mg via ORAL
  Filled 2018-11-30 (×3): qty 3

## 2018-11-30 MED ORDER — LIDOCAINE HCL (CARDIAC) PF 100 MG/5ML IV SOSY
PREFILLED_SYRINGE | INTRAVENOUS | Status: DC | PRN
  Administered 2018-11-30: 60 mg via INTRAVENOUS

## 2018-11-30 MED ORDER — METOPROLOL SUCCINATE ER 100 MG PO TB24
100.0000 mg | ORAL_TABLET | Freq: Every day | ORAL | Status: DC
  Administered 2018-11-30: 21:00:00 100 mg via ORAL
  Filled 2018-11-30 (×3): qty 1

## 2018-11-30 MED ORDER — TRAMADOL HCL 50 MG PO TABS: 50.0000 mg | ORAL_TABLET | Freq: Two times a day (BID) | ORAL | Status: DC | PRN

## 2018-11-30 MED ORDER — SEVOFLURANE IN SOLN
RESPIRATORY_TRACT | Status: AC
  Filled 2018-11-30: qty 250

## 2018-11-30 MED ORDER — ENOXAPARIN SODIUM 40 MG/0.4ML ~~LOC~~ SOLN
40.0000 mg | SUBCUTANEOUS | Status: DC
  Administered 2018-12-01: 40 mg via SUBCUTANEOUS
  Filled 2018-11-30: qty 0.4

## 2018-11-30 MED ORDER — LACTATED RINGERS IV SOLN: INTRAVENOUS | Status: DC

## 2018-11-30 MED ORDER — PROPOFOL 10 MG/ML IV BOLUS
INTRAVENOUS | Status: DC | PRN
  Administered 2018-11-30: 30 mg via INTRAVENOUS
  Administered 2018-11-30: 150 mg via INTRAVENOUS

## 2018-11-30 MED ORDER — LIDOCAINE HCL (PF) 2 % IJ SOLN
INTRAMUSCULAR | Status: AC
  Filled 2018-11-30: qty 10

## 2018-11-30 MED ORDER — FENTANYL CITRATE (PF) 100 MCG/2ML IJ SOLN
INTRAMUSCULAR | Status: AC
  Filled 2018-11-30: qty 2

## 2018-11-30 MED ORDER — MIDAZOLAM HCL 2 MG/2ML IJ SOLN
INTRAMUSCULAR | Status: DC | PRN
  Administered 2018-11-30: 2 mg via INTRAVENOUS

## 2018-11-30 MED ORDER — DEXAMETHASONE SODIUM PHOSPHATE 10 MG/ML IJ SOLN
INTRAMUSCULAR | Status: DC | PRN
  Administered 2018-11-30: 5 mg via INTRAVENOUS

## 2018-11-30 MED ORDER — FENTANYL CITRATE (PF) 100 MCG/2ML IJ SOLN
INTRAMUSCULAR | Status: DC | PRN
  Administered 2018-11-30: 25 ug via INTRAVENOUS
  Administered 2018-11-30: 50 ug via INTRAVENOUS
  Administered 2018-11-30 (×8): 25 ug via INTRAVENOUS

## 2018-11-30 MED ORDER — OXYBUTYNIN CHLORIDE 5 MG PO TABS: 5.0000 mg | ORAL_TABLET | Freq: Three times a day (TID) | ORAL | Status: DC | PRN

## 2018-11-30 MED ORDER — CEFAZOLIN SODIUM-DEXTROSE 2-4 GM/100ML-% IV SOLN
INTRAVENOUS | Status: AC
  Filled 2018-11-30: qty 100

## 2018-11-30 MED ORDER — FENTANYL CITRATE (PF) 100 MCG/2ML IJ SOLN: 25.0000 ug | INTRAMUSCULAR | Status: DC | PRN

## 2018-11-30 MED ORDER — FENTANYL CITRATE (PF) 250 MCG/5ML IJ SOLN
INTRAMUSCULAR | Status: AC
  Filled 2018-11-30: qty 5

## 2018-11-30 MED ORDER — MEPERIDINE HCL 50 MG/ML IJ SOLN: 6.2500 mg | INTRAMUSCULAR | Status: DC | PRN

## 2018-11-30 MED ORDER — FAMOTIDINE 20 MG PO TABS
20.0000 mg | ORAL_TABLET | Freq: Two times a day (BID) | ORAL | Status: DC
  Administered 2018-11-30 – 2018-12-01 (×2): 20 mg via ORAL
  Filled 2018-11-30 (×2): qty 1

## 2018-11-30 MED ORDER — PROMETHAZINE HCL 25 MG/ML IJ SOLN: 6.2500 mg | INTRAMUSCULAR | Status: DC | PRN

## 2018-11-30 MED ORDER — EPHEDRINE SULFATE 50 MG/ML IJ SOLN
INTRAMUSCULAR | Status: DC | PRN
  Administered 2018-11-30: 5 mg via INTRAVENOUS
  Administered 2018-11-30: 10 mg via INTRAVENOUS
  Administered 2018-11-30 (×2): 5 mg via INTRAVENOUS

## 2018-11-30 MED ORDER — EPHEDRINE SULFATE 50 MG/ML IJ SOLN
INTRAMUSCULAR | Status: AC
  Filled 2018-11-30: qty 1

## 2018-11-30 MED ORDER — OXYCODONE HCL 5 MG PO TABS: 5.0000 mg | ORAL_TABLET | Freq: Once | ORAL | Status: DC | PRN

## 2018-11-30 MED ORDER — DEXAMETHASONE SODIUM PHOSPHATE 10 MG/ML IJ SOLN
INTRAMUSCULAR | Status: AC
  Filled 2018-11-30: qty 1

## 2018-11-30 MED ORDER — ONDANSETRON HCL 4 MG/2ML IJ SOLN
INTRAMUSCULAR | Status: AC
  Filled 2018-11-30: qty 2

## 2018-11-30 MED ORDER — ACETAMINOPHEN 325 MG PO TABS: 650.0000 mg | ORAL_TABLET | ORAL | Status: DC | PRN

## 2018-11-30 MED ORDER — MIDAZOLAM HCL 2 MG/2ML IJ SOLN
INTRAMUSCULAR | Status: AC
  Filled 2018-11-30: qty 2

## 2018-11-30 MED ORDER — VANCOMYCIN 50 MG/ML ORAL SOLUTION
125.0000 mg | Freq: Four times a day (QID) | ORAL | Status: DC
  Administered 2018-11-30 – 2018-12-01 (×3): 125 mg via ORAL
  Filled 2018-11-30 (×6): qty 2.5

## 2018-11-30 MED ORDER — FUROSEMIDE 40 MG PO TABS
40.0000 mg | ORAL_TABLET | Freq: Every day | ORAL | Status: DC
  Administered 2018-11-30: 40 mg via ORAL
  Filled 2018-11-30 (×3): qty 1

## 2018-11-30 MED ORDER — SODIUM CHLORIDE 0.9 % IV SOLN
INTRAVENOUS | Status: DC
  Administered 2018-11-30: 13:00:00 via INTRAVENOUS

## 2018-11-30 MED ORDER — ONDANSETRON HCL 4 MG/2ML IJ SOLN
INTRAMUSCULAR | Status: DC | PRN
  Administered 2018-11-30: 4 mg via INTRAVENOUS

## 2018-11-30 MED ORDER — PHENYLEPHRINE HCL (PRESSORS) 10 MG/ML IV SOLN
INTRAVENOUS | Status: DC | PRN
  Administered 2018-11-30: 100 ug via INTRAVENOUS
  Administered 2018-11-30: 200 ug via INTRAVENOUS

## 2018-11-30 MED ORDER — PANTOPRAZOLE SODIUM 40 MG PO TBEC
40.0000 mg | DELAYED_RELEASE_TABLET | Freq: Two times a day (BID) | ORAL | Status: DC
  Administered 2018-11-30 – 2018-12-01 (×2): 40 mg via ORAL
  Filled 2018-11-30 (×2): qty 1

## 2018-11-30 MED ORDER — CEFAZOLIN SODIUM-DEXTROSE 2-4 GM/100ML-% IV SOLN
2.0000 g | Freq: Once | INTRAVENOUS | Status: AC
  Administered 2018-11-30: 2 g via INTRAVENOUS

## 2018-11-30 MED ORDER — LACTATED RINGERS IV SOLN
INTRAVENOUS | Status: DC | PRN
  Administered 2018-11-30: 16:00:00 via INTRAVENOUS

## 2018-11-30 MED ORDER — FINASTERIDE 5 MG PO TABS
5.0000 mg | ORAL_TABLET | Freq: Every day | ORAL | Status: DC
  Administered 2018-11-30 – 2018-12-01 (×2): 5 mg via ORAL
  Filled 2018-11-30 (×3): qty 1

## 2018-11-30 MED ORDER — TAMSULOSIN HCL 0.4 MG PO CAPS
0.4000 mg | ORAL_CAPSULE | Freq: Every day | ORAL | Status: DC
  Administered 2018-12-01: 0.4 mg via ORAL
  Filled 2018-11-30: qty 1

## 2018-11-30 MED ORDER — POTASSIUM CHLORIDE CRYS ER 20 MEQ PO TBCR
20.0000 meq | EXTENDED_RELEASE_TABLET | Freq: Two times a day (BID) | ORAL | Status: DC
  Administered 2018-11-30 – 2018-12-01 (×2): 20 meq via ORAL
  Filled 2018-11-30 (×2): qty 1

## 2018-11-30 MED ORDER — OXYCODONE HCL 5 MG/5ML PO SOLN: 5.0000 mg | Freq: Once | ORAL | Status: DC | PRN

## 2018-11-30 MED ORDER — ONDANSETRON HCL 4 MG/2ML IJ SOLN: 4.0000 mg | INTRAMUSCULAR | Status: DC | PRN

## 2018-11-30 SURGICAL SUPPLY — 20 items
BAG DRAIN CYSTO-URO LG1000N (MISCELLANEOUS) ×4 IMPLANT
BASKET ZERO TIP 1.9FR (BASKET) IMPLANT
BSKT STON RTRVL ZERO TP 1.9FR (BASKET)
CATH FOL 2WAY LX 18X30 (CATHETERS) ×2 IMPLANT
CATH URETL 5X70 OPEN END (CATHETERS) ×2 IMPLANT
FIBER LASER FLEXIVA 1000 (UROLOGICAL SUPPLIES) ×2 IMPLANT
GLOVE BIO SURGEON STRL SZ8 (GLOVE) ×4 IMPLANT
GOWN STRL REUS W/ TWL LRG LVL3 (GOWN DISPOSABLE) ×4 IMPLANT
GOWN STRL REUS W/ TWL XL LVL3 (GOWN DISPOSABLE) ×2 IMPLANT
GOWN STRL REUS W/TWL LRG LVL3 (GOWN DISPOSABLE) ×8
GOWN STRL REUS W/TWL XL LVL3 (GOWN DISPOSABLE) ×4
IV NS IRRIG 3000ML ARTHROMATIC (IV SOLUTION) ×22 IMPLANT
KIT PROBE TRILOGY 3.9X350 (MISCELLANEOUS) ×2 IMPLANT
KIT TURNOVER CYSTO (KITS) ×4 IMPLANT
PACK CYSTO AR (MISCELLANEOUS) ×4 IMPLANT
SET IRRIG Y TYPE TUR BLADDER L (SET/KITS/TRAYS/PACK) ×4 IMPLANT
SURGILUBE 2OZ TUBE FLIPTOP (MISCELLANEOUS) ×4 IMPLANT
SYRINGE IRR TOOMEY STRL 70CC (SYRINGE) ×4 IMPLANT
WATER STERILE IRR 1000ML POUR (IV SOLUTION) ×4 IMPLANT
WATER STERILE IRR 3000ML UROMA (IV SOLUTION) IMPLANT

## 2018-11-30 NOTE — Anesthesia Postprocedure Evaluation (Signed)
Anesthesia Post Note  Patient: DEZI SCHANER  Procedure(s) Performed: CYSTOSCOPY WITH LITHOLAPAXY (N/A Bladder) CYSTOSCOPY/URETEROSCOPY/HOLMIUM LASER/STENT PLACEMENT (Right Ureter) CYSTOSCOPY WITH RETROGRADE PYELOGRAM (Right Ureter)  Patient location during evaluation: PACU Anesthesia Type: General Level of consciousness: awake and alert Pain management: pain level controlled Vital Signs Assessment: post-procedure vital signs reviewed and stable Respiratory status: spontaneous breathing, nonlabored ventilation, respiratory function stable and patient connected to nasal cannula oxygen Cardiovascular status: blood pressure returned to baseline and stable Postop Assessment: no apparent nausea or vomiting Anesthetic complications: no     Last Vitals:  Vitals:   11/28/18 0540 11/28/18 0800  BP: (!) 118/58 116/81  Pulse: 67 70  Resp: 18 16  Temp: 37.4 C 37.4 C  SpO2: 96% 99%    Last Pain:  Vitals:   11/28/18 0800  TempSrc: Oral  PainSc: 0-No pain                 Molli Barrows

## 2018-11-30 NOTE — Anesthesia Postprocedure Evaluation (Signed)
Anesthesia Post Note  Patient: Marvin Ortega  Procedure(s) Performed: CYSTOSCOPY WITH LITHOLAPAXY (N/A ) CYSTOSCOPY WITH HOLMIUM LASER LITHOTRIPSY  Patient location during evaluation: PACU Anesthesia Type: General Level of consciousness: awake and alert Pain management: pain level controlled Vital Signs Assessment: post-procedure vital signs reviewed and stable Respiratory status: spontaneous breathing and respiratory function stable Cardiovascular status: stable Anesthetic complications: no     Last Vitals:  Vitals:   11/30/18 1744 11/30/18 1746  BP:  (!) 139/97  Pulse: 72 80  Resp: 10 13  Temp:    SpO2: 97% 97%    Last Pain:  Vitals:   11/30/18 1744  TempSrc:   PainSc: 0-No pain                 Romani Wilbon K

## 2018-11-30 NOTE — Interval H&P Note (Signed)
History and Physical Interval Note: Mr. Boylen underwent right ureteroscopy with laser lithotripsy and removal of distal ureteral calculi with stent placement and stage cystolitholapaxy of a 5 cm bladder calculus.  He developed C. difficile enterocolitis with a single dose of Ancef preop.  His preoperative urine culture was negative.  He presents for completion of cystolitholapaxy.  All questions were answered and he desires to proceed.  9/15  11/30/2018 1:23 PM  [name]  has presented today for surgery, with the diagnosis of Bladder Calculus.  The various methods of treatment have been discussed with the patient and family. After consideration of risks, benefits and other options for treatment, the patient has consented to  Procedure(s): CYSTOSCOPY WITH LITHOLAPAXY (N/A) as a surgical intervention.  The patient's history has been reviewed, patient examined, no change in status, stable for surgery.  I have reviewed the patient's chart and labs.  Questions were answered to the patient's satisfaction.     [place]

## 2018-11-30 NOTE — Anesthesia Post-op Follow-up Note (Signed)
Anesthesia QCDR form completed.        

## 2018-11-30 NOTE — Anesthesia Preprocedure Evaluation (Signed)
Anesthesia Evaluation  Patient identified by MRN, date of birth, ID band Patient awake    Reviewed: Allergy & Precautions, NPO status , Patient's Chart, lab work & pertinent test results  History of Anesthesia Complications Negative for: history of anesthetic complications  Airway Mallampati: II  TM Distance: >3 FB Neck ROM: Full    Dental no notable dental hx.    Pulmonary neg pulmonary ROS, neg sleep apnea, neg COPD,    breath sounds clear to auscultation- rhonchi (-) wheezing      Cardiovascular hypertension, Pt. on medications (-) CAD, (-) Past MI, (-) Cardiac Stents and (-) CABG  Rhythm:Regular Rate:Normal - Systolic murmurs and - Diastolic murmurs    Neuro/Psych neg Seizures PSYCHIATRIC DISORDERS negative neurological ROS     GI/Hepatic Neg liver ROS, GERD  ,  Endo/Other  negative endocrine ROSneg diabetes  Renal/GU Renal disease: nephrolithiasis.     Musculoskeletal negative musculoskeletal ROS (+)   Abdominal (+) - obese,   Peds  Hematology negative hematology ROS (+)   Anesthesia Other Findings Past Medical History: No date: BPH (benign prostatic hyperplasia) No date: Dieulafoy lesion of stomach No date: Dyspnea     Comment:  on exertion  No date: GERD (gastroesophageal reflux disease) 02/15/2018: GIB (gastrointestinal bleeding) No date: Hemorrhagic shock (HCC) No date: History of kidney stones No date: Hypertension No date: Pre-diabetes No date: Upper GI bleed   Reproductive/Obstetrics                             Anesthesia Physical Anesthesia Plan  ASA: II  Anesthesia Plan: General   Post-op Pain Management:    Induction: Intravenous  PONV Risk Score and Plan: 1 and Propofol infusion  Airway Management Planned: Natural Airway  Additional Equipment:   Intra-op Plan:   Post-operative Plan:   Informed Consent: I have reviewed the patients History and  Physical, chart, labs and discussed the procedure including the risks, benefits and alternatives for the proposed anesthesia with the patient or authorized representative who has indicated his/her understanding and acceptance.     Dental advisory given  Plan Discussed with: CRNA and Anesthesiologist  Anesthesia Plan Comments:         Anesthesia Quick Evaluation

## 2018-11-30 NOTE — Anesthesia Procedure Notes (Signed)
Procedure Name: LMA Insertion Date/Time: 11/30/2018 1:42 PM Performed by: Cory Munch, RN Pre-anesthesia Checklist: Patient identified Patient Re-evaluated:Patient Re-evaluated prior to induction Oxygen Delivery Method: Circle system utilized Preoxygenation: Pre-oxygenation with 100% oxygen Induction Type: IV induction Ventilation: Mask ventilation without difficulty LMA: LMA inserted LMA Size: 5.0 Number of attempts: 1 Placement Confirmation: positive ETCO2 and breath sounds checked- equal and bilateral Tube secured with: Tape Dental Injury: Teeth and Oropharynx as per pre-operative assessment

## 2018-11-30 NOTE — Op Note (Signed)
Preoperative diagnosis:  1. Bladder calculus <2.5 cm 2. Indwelling ureteral stent  Postoperative diagnosis:  Same  Procedure: 1. Cystolitholapaxy <2.5 cm 2. Removal right ureteral stent  Surgeon: Abbie Sons, MD  Anesthesia: General  Complications: None  Intraoperative findings:  Multiple remain stone fragments from prior cystolitholapaxy with 2 additional fragments each measuring >1 cm.  EBL: Minimal  Specimens: None  Indication: Marvin Ortega is a 68 y.o. patient with an approximately 5 cm jack stone bladder calculus who presents for completion of stage cystolitholapaxy.  Last week he is status post cystolitholapaxy and ureteroscopic removal of 2 right distal ureteral calculi.  After reviewing the management options for treatment, he elected to proceed with the above surgical procedure(s). We have discussed the potential benefits and risks of the procedure, side effects of the proposed treatment, the likelihood of the patient achieving the goals of the procedure, and any potential problems that might occur during the procedure or recuperation. Informed consent has been obtained.  Description of procedure:  The patient was taken to the operating room and general anesthesia was induced.  The patient was placed in the dorsal lithotomy position, prepped and draped in the usual sterile fashion, and preoperative antibiotics were administered. A preoperative time-out was performed.   A 26 [place] Piranha scope sheath with obturator was lubricated and passed under direct vision.  The inner sheath was removed and the offset scope was placed into the sheath.  Multiple remaining fragments from his previous procedure were subtle at the bladder base.  There were 2 larger fragments measuring greater than 1 cm in size.  There was moderate median lobe prostate tissue.  The right ureteral stent was easily identified.  A [place] Geologist, engineering was then placed through the scope  and fragmentation of the calculus was commenced at settings of 100% shockwave/12 Hz and 100% ultrasound.  Due to the hard composition of this calculus fragmentation of the remaining fragments was slow.  The trilogy was removed and replaced with a 1000 m holmium laser fiber and fragmentation of the larger calculi was performed starting at 0.4 J / 53 W and increased to 1 J / 53 W.  Once adequately fragmented the power was decreased to 0.4/40 W and several of the smaller fragments were treated.  The fragments were removed via irrigation with repeat fragmentation of larger calculi.  Once almost all the fragments were irrigated from the bladder the trilogy was replaced and smaller fragments and dust particles were treated with a combination of ultrasound and suction from the bladder.  Once all significantly sized fragments were removed the right ureteral stent was grasped endoscopic forceps and removed without difficulty.  A 20 French Foley catheter was placed without difficulty and irrigated with return of clear effluent.  After anesthetic reversal the patient was transported to the PACU in stable condition.  Plan: He lives alone and will be kept for overnight observation.  His Foley catheter will be removed early next week by home health.  Abbie Sons, M.D.

## 2018-11-30 NOTE — Transfer of Care (Addendum)
Immediate Anesthesia Transfer of Care Note  Patient: Marvin Ortega  Procedure(s) Performed: CYSTOSCOPY WITH LITHOLAPAXY (N/A ) CYSTOSCOPY WITH HOLMIUM LASER LITHOTRIPSY  Patient Location: PACU  Anesthesia Type:General  Level of Consciousness: sedated  Airway & Oxygen Therapy: Patient Spontanous Breathing and Patient connected to face mask oxygen  Post-op Assessment: Report given to RN and Post -op Vital signs reviewed and stable  Post vital signs: Reviewed and stable  Last Vitals:  Vitals Value Taken Time  BP    Temp    Pulse    Resp    SpO2      Last Pain:  Vitals:   11/30/18 1218  TempSrc: Oral  PainSc: 0-No pain         Complications: No apparent anesthesia complications

## 2018-12-01 ENCOUNTER — Telehealth: Payer: Self-pay | Admitting: Urology

## 2018-12-01 ENCOUNTER — Encounter: Payer: Self-pay | Admitting: Urology

## 2018-12-01 DIAGNOSIS — N210 Calculus in bladder: Secondary | ICD-10-CM | POA: Diagnosis not present

## 2018-12-01 LAB — STONE ANALYSIS
Calcium Oxalate Dihydrate: 10 %
Calcium Oxalate Monohydrate: 80 %
Uric Acid Calculi: 10 %
Weight Calculi: 28 mg

## 2018-12-01 MED ORDER — CHLORHEXIDINE GLUCONATE CLOTH 2 % EX PADS
6.0000 | MEDICATED_PAD | Freq: Every day | CUTANEOUS | Status: DC
  Administered 2018-12-01: 6 via TOPICAL

## 2018-12-01 NOTE — Progress Notes (Signed)
Patient was given all discharge information. Using teach back, he proved that he understood when to take his medication, his follow-up appointments and when to call the doctor. Patient's condition was stable and his IV was taken out. He tolerated it well. He was taken to the front in a wheelchair by one of our staff members.

## 2018-12-01 NOTE — Progress Notes (Signed)
POD#1  No complaints this morning.  Kept overnight observation as he lives alone.  VSS, afebrile  Exam: Foley catheter draining clear urine.  Abdomen soft   Impression: Doing well status post cystolitholapaxy-completion of staged treatment.  Plan: -Discharge home with indwelling Foley -Have home health remove Foley Monday morning 9/28 -Office follow-up approximately 1 month

## 2018-12-01 NOTE — Telephone Encounter (Signed)
-----   Message from Abbie Sons, MD sent at 12/01/2018  8:31 AM EDT ----- Needs a 1 month follow-up with bladder scan either me or PA

## 2018-12-01 NOTE — Care Management Obs Status (Signed)
[place] NOTIFICATION   Patient Details  Name: FREDRIK MOGEL MRN: [id number] Date of Birth: [birth year]   Medicare Observation Status Notification Given:  No(admitted obs less thand 24 hours)    Beverly Sessions, RN 12/01/2018, 8:55 AM

## 2018-12-01 NOTE — Discharge Summary (Signed)
Date of admission: 11/30/2018  Date of discharge: 12/01/2018  Admission diagnosis: Bladder calculus  Discharge diagnosis: Bladder calculus  Secondary diagnoses:  Patient Active Problem List   Diagnosis Date Noted  . Bladder calculus 11/30/2018  . C. difficile colitis 11/25/2018  . Renal calculus 11/23/2018  . Ureteral calculi 10/29/2018  . Calculus of bladder 10/29/2018  . Nephrolithiasis 10/29/2018  . Gross hematuria 09/28/2018  . PAF (paroxysmal atrial fibrillation) ([place]) 02/25/2018  . Benign prostatic hyperplasia with lower urinary tract symptoms 12/08/2013  . Essential (primary) hypertension 12/08/2013  . AA (alcohol abuse) 12/08/2013  . GERD (gastroesophageal reflux disease) 02/13/2011  . Hyperlipidemia 06/20/2010    History and Physical: For full details, please see admission history and physical. Briefly, Marvin Ortega is a 68 y.o. year old patient with a large bladder calculus who underwent completion of stage cystolitholapaxy.  He lives alone and was placed in overnight observation.  Hospital Course: Patient tolerated the procedure well.  He was then transferred to the floor after an uneventful PACU stay.  His hospital course was uncomplicated.  On POD#1 he had met discharge criteria: was eating a regular diet, was up and ambulating independently,  pain was well controlled.  He will be discharged with an indwelling catheter and will have home health remove on Monday 12/06/2018  Laboratory values:  Recent Labs    11/30/18 1950  WBC 4.5  HGB 9.2*  HCT 29.6*   Recent Labs    11/30/18 1950  CREATININE 1.07   No results for input(s): LABPT, INR in the last 72 hours. No results for input(s): LABURIN in the last 72 hours. Results for orders placed or performed during the hospital encounter of 11/29/18  SARS CORONAVIRUS 2 (TAT 6-24 HRS) Nasopharyngeal Nasopharyngeal Swab     Status: None   Collection Time: 11/29/18 10:31 AM   Specimen: Nasopharyngeal Swab  Result  Value Ref Range Status   SARS Coronavirus 2 NEGATIVE NEGATIVE Final    Comment: (NOTE) SARS-CoV-2 target nucleic acids are NOT DETECTED. The SARS-CoV-2 RNA is generally detectable in upper and lower respiratory specimens during the acute phase of infection. Negative results do not preclude SARS-CoV-2 infection, do not rule out co-infections with other pathogens, and should not be used as the sole basis for treatment or other patient management decisions. Negative results must be combined with clinical observations, patient history, and epidemiological information. The expected result is Negative. Fact Sheet for Patients: SugarRoll.be Fact Sheet for Healthcare Providers: https://www.woods-mathews.com/ This test is not yet approved or cleared by the [place] FDA and  has been authorized for detection and/or diagnosis of SARS-CoV-2 by FDA under an Emergency Use Authorization (EUA). This EUA will remain  in effect (meaning this test can be used) for the duration of the COVID-19 declaration under Section 56 4(b)(1) of the Act, 21 U.S.C. section 360bbb-3(b)(1), unless the authorization is terminated or revoked sooner. Performed at [facility], [place] [address] [place], [place] [address]     Disposition: Home  Discharge instruction: The patient was instructed to be ambulatory but told to refrain from heavy lifting, strenuous activity for 1 week  Discharge medications:  Allergies as of 12/01/2018   No Known Allergies     Medication List    STOP taking these medications   tamsulosin 0.4 MG Caps capsule Commonly known as: FLOMAX     TAKE these medications   famotidine 20 MG tablet Commonly known as: PEPCID Take 20 mg by mouth 2 (two) times daily.  finasteride 5 MG tablet Commonly known as: PROSCAR Take 5 mg by mouth daily.   furosemide 40 MG tablet Commonly known as: LASIX TAKE 1 TABLET BY MOUTH EVERY DAY   metoprolol  succinate 50 MG 24 hr tablet Commonly known as: TOPROL-XL Take 100 mg by mouth daily.   oxybutynin 5 MG tablet Commonly known as: DITROPAN Take 1 tablet (5 mg total) by mouth every 8 (eight) hours as needed for bladder spasms.   pantoprazole 40 MG tablet Commonly known as: PROTONIX Take 1 tablet (40 mg total) by mouth 2 (two) times daily.   Potassium Chloride ER 20 MEQ Tbcr TAKE 1 TABLET BY MOUTH TWICE A DAY   silodosin 8 MG Caps capsule Commonly known as: RAPAFLO Take 8 mg by mouth daily.   vancomycin 50 mg/mL  oral solution Commonly known as: VANCOCIN Take 2.5 mLs (125 mg total) by mouth 4 (four) times daily for 7 days.   vitamin B-1 250 MG tablet Take 250 mg by mouth daily.       Followup: Office follow-up 1 month

## 2018-12-01 NOTE — Telephone Encounter (Signed)
App made and mailed ° °Marvin Ortega ° °

## 2018-12-01 NOTE — TOC Transition Note (Signed)
Transition of Care [facility]) - CM/SW Discharge Note   Patient Details  Name: Marvin Ortega MRN: [id number] Date of Birth: Jul 22, 1950  [facility] CM/SW Contact:  Marvin Sessions, RN Phone Number: 12/01/2018, 10:12 AM   Clinical Narrative:     Patient to discharge with resumption of home health orders.   Marvin Ortega with [facility] notified of discharge.  Will need foley removed on Monday.  She is aware  Final next level of care: [place] Barriers to Discharge: No Barriers Identified   Patient Goals and CMS Choice        Discharge Placement                       Discharge Plan and Services                          HH Arranged: RN, PT, Nurse's Aide [place] Agency: [facility] [employer] Date [place]: 12/01/18 Time [place]: 1012 [place] spoke with [place] Interventions     Readmission Risk Interventions No flowsheet data found.

## 2018-12-02 ENCOUNTER — Emergency Department
Admission: EM | Admit: 2018-12-02 | Discharge: 2018-12-02 | Disposition: A | Discharge status: Home / Self Care | Payer: Medicare HMO | Attending: Emergency Medicine | Admitting: Emergency Medicine

## 2018-12-02 ENCOUNTER — Other Ambulatory Visit: Payer: Self-pay

## 2018-12-02 ENCOUNTER — Encounter: Payer: Self-pay | Admitting: Emergency Medicine

## 2018-12-02 DIAGNOSIS — Z466 Encounter for fitting and adjustment of urinary device: Secondary | ICD-10-CM | POA: Diagnosis not present

## 2018-12-02 DIAGNOSIS — I10 Essential (primary) hypertension: Secondary | ICD-10-CM | POA: Insufficient documentation

## 2018-12-02 DIAGNOSIS — Z79899 Other long term (current) drug therapy: Secondary | ICD-10-CM | POA: Diagnosis not present

## 2018-12-02 DIAGNOSIS — Z7689 Persons encountering health services in other specified circumstances: Secondary | ICD-10-CM

## 2018-12-02 DIAGNOSIS — R319 Hematuria, unspecified: Secondary | ICD-10-CM | POA: Diagnosis present

## 2018-12-02 DIAGNOSIS — Z960 Presence of urogenital implants: Secondary | ICD-10-CM | POA: Diagnosis not present

## 2018-12-02 NOTE — ED Triage Notes (Signed)
Pt reports is passing blood in his urinary catheter and having pain. Pt reports has a procedure Tuesday and has been having issues since then.

## 2018-12-02 NOTE — ED Provider Notes (Signed)
[facility] Emergency Department Provider Note   ____________________________________________   I have reviewed the triage vital signs and the nursing notes.   HISTORY  Chief Complaint Hematuria   History limited by: Not Limited   HPI Marvin Ortega is a 68 y.o. male who presents to the emergency department today because of concern for bleeding around his foley catheter and discomfort with foley catheter. Patient had it placed 2 days ago after a procedure to break up a bladder stone. Was discharged from the hospital yesterday. Says that since leaving he has noticed the discomfort. Noticed some blood coming out around the foley catheter today. Has continued to have urinary output into the leg bag.   Records reviewed. Per medical record review patient has a history of cystoscopy with litholapaxy performed 2 days ago.  Past Medical History:  Diagnosis Date  . BPH (benign prostatic hyperplasia)   . Dieulafoy lesion of stomach   . Dyspnea    on exertion   . GERD (gastroesophageal reflux disease)   . GIB (gastrointestinal bleeding) 02/15/2018  . Hemorrhagic shock (HCC)   . History of kidney stones   . Hypertension   . Pre-diabetes   . Upper GI bleed     Patient Active Problem List   Diagnosis Date Noted  . Bladder calculus 11/30/2018  . C. difficile colitis 11/25/2018  . Renal calculus 11/23/2018  . Ureteral calculi 10/29/2018  . Calculus of bladder 10/29/2018  . Nephrolithiasis 10/29/2018  . Gross hematuria 09/28/2018  . PAF (paroxysmal atrial fibrillation) (HCC) 02/25/2018  . Benign prostatic hyperplasia with lower urinary tract symptoms 12/08/2013  . Essential (primary) hypertension 12/08/2013  . AA (alcohol abuse) 12/08/2013  . GERD (gastroesophageal reflux disease) 02/13/2011  . Hyperlipidemia 06/20/2010    Past Surgical History:  Procedure Laterality Date  . CYSTOSCOPY W/ RETROGRADES Right 11/23/2018   Procedure: CYSTOSCOPY WITH  RETROGRADE PYELOGRAM;  Surgeon: Riki Altes, MD;  Location: [facility] ORS;  Service: Urology;  Laterality: Right;  . CYSTOSCOPY WITH HOLMIUM LASER LITHOTRIPSY  11/30/2018   Procedure: CYSTOSCOPY WITH HOLMIUM LASER LITHOTRIPSY;  Surgeon: Riki Altes, MD;  Location: [facility] ORS;  Service: Urology;;  . Bluford Kaufmann WITH LITHOLAPAXY N/A 11/23/2018   Procedure: CYSTOSCOPY WITH LITHOLAPAXY;  Surgeon: Riki Altes, MD;  Location: [facility] ORS;  Service: Urology;  Laterality: N/A;  . CYSTOSCOPY WITH LITHOLAPAXY N/A 11/30/2018   Procedure: CYSTOSCOPY WITH LITHOLAPAXY;  Surgeon: Riki Altes, MD;  Location: [facility] ORS;  Service: Urology;  Laterality: N/A;  . CYSTOSCOPY/URETEROSCOPY/HOLMIUM LASER/STENT PLACEMENT Right 11/23/2018   Procedure: CYSTOSCOPY/URETEROSCOPY/HOLMIUM LASER/STENT PLACEMENT;  Surgeon: Riki Altes, MD;  Location: [facility] ORS;  Service: Urology;  Laterality: Right;  . ESOPHAGOGASTRODUODENOSCOPY N/A 02/15/2018   Procedure: ESOPHAGOGASTRODUODENOSCOPY (EGD);  Surgeon: Toney Reil, MD;  Location: [facility] ENDOSCOPY;  Service: Gastroenterology;  Laterality: N/A;    Prior to Admission medications   Medication Sig Start Date End Date Taking? Authorizing Provider  famotidine (PEPCID) 20 MG tablet Take 20 mg by mouth 2 (two) times daily. 02/10/18   [provider]  finasteride (PROSCAR) 5 MG tablet Take 5 mg by mouth daily.    [provider]  furosemide (LASIX) 40 MG tablet TAKE 1 TABLET BY MOUTH EVERY DAY 09/23/18   [provider]  metoprolol succinate (TOPROL-XL) 50 MG 24 hr tablet Take 100 mg by mouth daily. 02/13/18   [provider]  oxybutynin (DITROPAN) 5 MG tablet Take 1 tablet (5 mg total) by mouth every 8 (eight) hours  as needed for bladder spasms. 11/28/18   [place]uburn BilberryPatel, Shreyang, MD  pantoprazole (PROTONIX) 40 MG tablet Take 1 tablet (40 mg total) by mouth 2 (two) times daily. 02/19/18   Mayo, Allyn KennerKaty Dodd, MD  Potassium Chloride ER 20 MEQ TBCR TAKE 1  TABLET BY MOUTH TWICE A DAY 09/23/18   [provider]  silodosin (RAPAFLO) 8 MG CAPS capsule Take 8 mg by mouth daily. 01/26/18   [provider]  Thiamine HCl (VITAMIN B-1) 250 MG tablet Take 250 mg by mouth daily.    [provider]  vancomycin (VANCOCIN) 50 mg/mL oral solution Take 2.5 mLs (125 mg total) by mouth 4 (four) times daily for 7 days. 11/28/18 12/05/18  [place]urn BilberryPatel, Shreyang, MD    Allergies Patient has no known allergies.  Family History  Problem Relation Age of Onset  . Heart disease Father   . Aneurysm Sister     Social History Social History   Tobacco Use  . Smoking status: Never Smoker  . Smokeless tobacco: Never Used  Substance Use Topics  . Alcohol use: Yes  . Drug use: Never    Review of Systems Constitutional: No fever/chills Eyes: No visual changes. ENT: No sore throat. Cardiovascular: Denies chest pain. Respiratory: Denies shortness of breath. Gastrointestinal: No abdominal pain.  No nausea, no vomiting.  No diarrhea.   Genitourinary: Positive for bleeding around foley catheter Musculoskeletal: Negative for back pain. Skin: Negative for rash. Neurological: Negative for headaches, focal weakness or numbness.  ____________________________________________   PHYSICAL EXAM:  VITAL SIGNS: ED Triage Vitals  Enc Vitals Group     BP 12/02/18 0934 111/72     Pulse Rate 12/02/18 0934 77     Resp 12/02/18 0934 18     Temp 12/02/18 0934 99 F (37.2 C)     Temp Source 12/02/18 0934 Oral     SpO2 12/02/18 0934 99 %     Weight 12/02/18 0925 153 lb (69.4 kg)     Height 12/02/18 0925 5\' 6"  (1.676 m)     Head Circumference --      Peak Flow --      Pain Score 12/02/18 0925 8   Constitutional: Alert and oriented.  Eyes: Conjunctivae are normal.  ENT      Head: Normocephalic and atraumatic.      Nose: No congestion/rhinnorhea.      Mouth/Throat: Mucous membranes are moist.      Neck: No  stridor. Hematological/Lymphatic/Immunilogical: No cervical lymphadenopathy. Cardiovascular: Normal rate, regular rhythm.  No murmurs, rubs, or gallops.  Respiratory: Normal respiratory effort without tachypnea nor retractions. Breath sounds are clear and equal bilaterally. No wheezes/rales/rhonchi. Gastrointestinal: Soft and non tender. No rebound. No guarding.  Genitourinary: Small amount of dried blood noted on foley catheter. Dark yellow urine in leg bag.  Musculoskeletal: Normal range of motion in all extremities. No lower extremity edema. Neurologic:  Normal speech and language. No gross focal neurologic deficits are appreciated.  Skin:  Skin is warm, dry and intact. No rash noted. Psychiatric: Mood and affect are normal. Speech and behavior are normal. Patient exhibits appropriate insight and judgment.  ____________________________________________    LABS (pertinent positives/negatives)  None  ____________________________________________   EKG  None  ____________________________________________    RADIOLOGY  None  ____________________________________________   PROCEDURES  Procedures  ____________________________________________   INITIAL IMPRESSION / ASSESSMENT AND PLAN / ED COURSE  Pertinent labs & imaging results that were available during my care of the patient were reviewed by me and  considered in my medical decision making (see chart for details).   Patient presented to the emergency department today because of concerns for some bleeding around site of recently placed Foley catheter.  Bladder scan showed less than 50 and patient does appear to still be draining urine in bag.  Do think likely this bleeding is coming from some irritation from the Foley catheter.  Discussed this with the patient.  Will plan on discharging to follow-up with urology.  ____________________________________________   FINAL CLINICAL IMPRESSION(S) / ED DIAGNOSES  Final  diagnoses:  Encounter for assessment of Foley catheter     Note: This dictation was prepared with Dragon dictation. Any transcriptional errors that result from this process are unintentional     Nance Pear, MD 12/02/18 1103

## 2018-12-06 ENCOUNTER — Telehealth: Payer: Self-pay | Admitting: Urology

## 2018-12-06 NOTE — Telephone Encounter (Signed)
Called [name] informed her it is ok to have foley pulled this week when she visits the pt.

## 2018-12-06 NOTE — Telephone Encounter (Signed)
Marvin Ortega from [facility] asking for clarification of foley removal, saw pt on Saturday, not able to remove foley today as no visits will be made today, Marvin Ortega wants to ensure or clarify it's okay to remove foley the next nurse visit this week. Please advise Marvin Ortega at [phone].

## 2018-12-07 ENCOUNTER — Encounter: Payer: Self-pay | Admitting: Urology

## 2018-12-30 ENCOUNTER — Other Ambulatory Visit: Payer: Self-pay

## 2018-12-30 ENCOUNTER — Other Ambulatory Visit: Payer: Self-pay | Admitting: Family Medicine

## 2018-12-30 ENCOUNTER — Ambulatory Visit (INDEPENDENT_AMBULATORY_CARE_PROVIDER_SITE_OTHER): Payer: Medicare HMO | Admitting: Physician Assistant

## 2018-12-30 ENCOUNTER — Encounter: Payer: Self-pay | Admitting: Physician Assistant

## 2018-12-30 VITALS — BP 129/76 | HR 105 | Ht 66.0 in | Wt 149.0 lb

## 2018-12-30 DIAGNOSIS — N200 Calculus of kidney: Secondary | ICD-10-CM

## 2018-12-30 DIAGNOSIS — N401 Enlarged prostate with lower urinary tract symptoms: Secondary | ICD-10-CM | POA: Diagnosis not present

## 2018-12-30 DIAGNOSIS — R918 Other nonspecific abnormal finding of lung field: Secondary | ICD-10-CM

## 2018-12-30 LAB — BLADDER SCAN AMB NON-IMAGING

## 2018-12-30 NOTE — Progress Notes (Signed)
12/30/2018 10:29 AM   Marvin Ortega 1951/02/12 [id number]  CC: Postop cystolitholapaxy and right ureteral stent removal  HPI: Marvin Ortega is a 68 y.o. male who presents for postoperative evaluation s/p completion of staged cystolitholapaxy and right ureteral stent removal with Dr. Bernardo Heater on 11/30/2018.  He proceeded to the emergency department on 12/02/2018 with complaints of hematuria and discomfort associated with his Foley catheter.  Foley catheter subsequently removed by home health approximately 3 weeks ago.  In the interim, patient reports feeling well.  He denies incomplete bladder emptying, dysuria, and gross hematuria.  He is still taking finasteride 5 mg daily.  PVR 260mL today.  PMH: Past Medical History:  Diagnosis Date  . BPH (benign prostatic hyperplasia)   . Dieulafoy lesion of stomach   . Dyspnea    on exertion   . GERD (gastroesophageal reflux disease)   . GIB (gastrointestinal bleeding) 02/15/2018  . Hemorrhagic shock ([place])   . History of kidney stones   . Hypertension   . Pre-diabetes   . Upper GI bleed     Surgical History: Past Surgical History:  Procedure Laterality Date  . CYSTOSCOPY W/ RETROGRADES Right 11/23/2018   Procedure: CYSTOSCOPY WITH RETROGRADE PYELOGRAM;  Surgeon: Abbie Sons, MD;  Location: [facility] ORS;  Service: Urology;  Laterality: Right;  . CYSTOSCOPY WITH HOLMIUM LASER LITHOTRIPSY  11/30/2018   Procedure: CYSTOSCOPY WITH HOLMIUM LASER LITHOTRIPSY;  Surgeon: Abbie Sons, MD;  Location: [facility] OR[facility];  Service: Urology;;  . Consuela Mimes WITH LITHOLAPAXY N/A 11/23/2018   Procedure: CYSTOSCOPY WITH LITHOLAPAXY;  Surgeon: Abbie Sons, MD;  Location: [facility];  Service: Urology;  Laterality: N/A;  . CYSTOSCOPY WITH LITHOLAPAXY N/A 11/30/2018   Procedure: CYSTOSCOPY WITH LITHOLAPAXY;  Surgeon: Abbie Sons, MD;  Location: [facility];  Service: Urology;  Laterality: N/A;  . CYSTOSCOPY/URETEROSCOPY/HOLMIUM LASER/STENT PLACEMENT  Right 11/23/2018   Procedure: CYSTOSCOPY/URETEROSCOPY/HOLMIUM LASER/STENT PLACEMENT;  Surgeon: Abbie Sons, MD;  Location: [facility];  Service: Urology;  Laterality: Right;  . ESOPHAGOGASTRODUODENOSCOPY N/A 02/15/2018   Procedure: ESOPHAGOGASTRODUODENOSCOPY (EGD);  Surgeon: Lin Landsman, MD;  Location: [facility] ENDOSCOPY;  Service: Gastroenterology;  Laterality: N/A;    Home Medications:  Allergies as of 12/30/2018   No Known Allergies     Medication List       Accurate as of December 30, 2018 10:29 AM. If you have any questions, ask your nurse or doctor.        STOP taking these medications   famotidine 20 MG tablet Commonly known as: PEPCID Stopped by: Debroah Loop, PA-C   furosemide 40 MG tablet Commonly known as: LASIX Stopped by: Debroah Loop, PA-C   oxybutynin 5 MG tablet Commonly known as: DITROPAN Stopped by: Debroah Loop, PA-C   Potassium Chloride ER 20 MEQ Tbcr Stopped by: Debroah Loop, PA-C   silodosin 8 MG Caps capsule Commonly known as: RAPAFLO Stopped by: Debroah Loop, PA-C     TAKE these medications   finasteride 5 MG tablet Commonly known as: PROSCAR Take 5 mg by mouth daily.   metoprolol succinate 50 MG 24 hr tablet Commonly known as: TOPROL-XL Take 100 mg by mouth 2 (two) times daily.   pantoprazole 40 MG tablet Commonly known as: PROTONIX Take 1 tablet (40 mg total) by mouth 2 (two) times daily.   vitamin B-1 250 MG tablet Take 250 mg by mouth daily.       Allergies: No Known Allergies  Family History: Family History  Problem Relation Age of  Onset  . Heart disease Father   . Aneurysm Sister     Social History:  reports that he has never smoked. He has never used smokeless tobacco. He reports current alcohol use. He reports that he does not use drugs.  ROS: UROLOGY Frequent Urination?: No Hard to postpone urination?: No Burning/pain with urination?: No Get up at night to  urinate?: No Leakage of urine?: No Urine stream starts and stops?: No Trouble starting stream?: No Do you have to strain to urinate?: No Blood in urine?: No Urinary tract infection?: No Sexually transmitted disease?: No Injury to kidneys or bladder?: No Painful intercourse?: No Weak stream?: No Erection problems?: No Penile pain?: No  Gastrointestinal Nausea?: No Vomiting?: No Indigestion/heartburn?: No Diarrhea?: No Constipation?: No  Constitutional Fever: No Night sweats?: No Weight loss?: No Fatigue?: No  Skin Skin rash/lesions?: No Itching?: No  Eyes Blurred vision?: No Double vision?: No  Ears/Nose/Throat Sore throat?: No Sinus problems?: No  Hematologic/Lymphatic Swollen glands?: No Easy bruising?: No  Cardiovascular Leg swelling?: No Chest pain?: No  Respiratory Cough?: No Shortness of breath?: Yes  Endocrine Excessive thirst?: No  Musculoskeletal Back pain?: No Joint pain?: No  Neurological Headaches?: No Dizziness?: No  Psychologic Depression?: No Anxiety?: No  Physical Exam: BP 129/76   Pulse (!) 105   Ht 5\' 6"  (1.676 m)   Wt 149 lb (67.6 kg)   BMI 24.05 kg/m   Constitutional:  Alert and oriented, No acute distress. HEENT: [place] AT, moist mucus membranes.  Trachea midline, no masses. Cardiovascular: No clubbing, cyanosis, or edema. Respiratory: Normal respiratory effort, no increased work of breathing. GI: Abdomen is soft, nontender, nondistended, no abdominal masses GU: No CVA tenderness Lymph: No cervical or inguinal lymphadenopathy. Skin: No rashes, bruises or suspicious lesions. Neurologic: Grossly intact, no focal deficits, moving all 4 extremities. Psychiatric: Normal mood and affect.  Laboratory Data: Results for orders placed or performed in visit on 12/30/18  BLADDER SCAN AMB NON-IMAGING  Result Value Ref Range   Scan Result 01/01/19    Assessment & Plan:   1. Benign prostatic hyperplasia with lower urinary tract  symptoms, symptom details unspecified PVR 213 mL today.  Catheter removed approximately 3 weeks ago.  I believe this is stable and does not require further intervention at this time.  Patient to continue finasteride.  I explained to the patient that bladder stones are caused by urinary stasis and that I cannot guarantee he would not develop a new bladder stone in the future.  He is still hesitant to undergo bladder outlet procedure. - BLADDER SCAN AMB NON-IMAGING  2. Bilateral nephrolithiasis Patient is believed to still have bilateral nonobstructing nephrolithiasis, including a 2.1 cm inferior left renal pole stone.  I would like him to follow-up with Dr. in 3 months with a renal ultrasound prior to evaluate for hydronephrosis and stability of these.  He expressed understanding. - Lonna Cobb RENAL; Future - Return in about 3 months (around 04/01/2019) for Bilateral nephrolithiasis f/u with [facility] with renal [facility] prior.  [place], PA-C  [facility] [address] [place], [place] [phone]

## 2018-12-31 ENCOUNTER — Other Ambulatory Visit: Payer: Self-pay

## 2018-12-31 ENCOUNTER — Ambulatory Visit
Admission: RE | Admit: 2018-12-31 | Discharge: 2018-12-31 | Disposition: A | Discharge status: Home / Self Care | Payer: Medicare HMO | Source: Ambulatory Visit | Attending: Family Medicine | Admitting: Family Medicine

## 2018-12-31 DIAGNOSIS — R918 Other nonspecific abnormal finding of lung field: Secondary | ICD-10-CM | POA: Insufficient documentation

## 2019-03-28 ENCOUNTER — Emergency Department: Payer: Medicare HMO

## 2019-03-28 ENCOUNTER — Inpatient Hospital Stay
Admission: EM | Admit: 2019-03-28 | Discharge: 2019-04-02 | DRG: 871 | Disposition: A | Discharge status: Home Health | Payer: Medicare HMO | Attending: Internal Medicine | Admitting: Internal Medicine

## 2019-03-28 ENCOUNTER — Inpatient Hospital Stay: Payer: Medicare HMO

## 2019-03-28 ENCOUNTER — Other Ambulatory Visit: Payer: Self-pay

## 2019-03-28 DIAGNOSIS — N390 Urinary tract infection, site not specified: Secondary | ICD-10-CM | POA: Diagnosis present

## 2019-03-28 DIAGNOSIS — I480 Paroxysmal atrial fibrillation: Secondary | ICD-10-CM | POA: Diagnosis present

## 2019-03-28 DIAGNOSIS — D649 Anemia, unspecified: Secondary | ICD-10-CM | POA: Diagnosis not present

## 2019-03-28 DIAGNOSIS — N179 Acute kidney failure, unspecified: Secondary | ICD-10-CM | POA: Diagnosis not present

## 2019-03-28 DIAGNOSIS — J449 Chronic obstructive pulmonary disease, unspecified: Secondary | ICD-10-CM | POA: Diagnosis not present

## 2019-03-28 DIAGNOSIS — K219 Gastro-esophageal reflux disease without esophagitis: Secondary | ICD-10-CM | POA: Diagnosis present

## 2019-03-28 DIAGNOSIS — R339 Retention of urine, unspecified: Secondary | ICD-10-CM

## 2019-03-28 DIAGNOSIS — N10 Acute tubulo-interstitial nephritis: Secondary | ICD-10-CM | POA: Diagnosis not present

## 2019-03-28 DIAGNOSIS — J440 Chronic obstructive pulmonary disease with acute lower respiratory infection: Secondary | ICD-10-CM | POA: Diagnosis present

## 2019-03-28 DIAGNOSIS — N401 Enlarged prostate with lower urinary tract symptoms: Secondary | ICD-10-CM | POA: Diagnosis present

## 2019-03-28 DIAGNOSIS — J209 Acute bronchitis, unspecified: Secondary | ICD-10-CM | POA: Diagnosis present

## 2019-03-28 DIAGNOSIS — N138 Other obstructive and reflux uropathy: Secondary | ICD-10-CM | POA: Diagnosis present

## 2019-03-28 DIAGNOSIS — I4891 Unspecified atrial fibrillation: Secondary | ICD-10-CM | POA: Diagnosis not present

## 2019-03-28 DIAGNOSIS — N170 Acute kidney failure with tubular necrosis: Secondary | ICD-10-CM | POA: Diagnosis present

## 2019-03-28 DIAGNOSIS — A419 Sepsis, unspecified organism: Secondary | ICD-10-CM | POA: Diagnosis not present

## 2019-03-28 DIAGNOSIS — I10 Essential (primary) hypertension: Secondary | ICD-10-CM | POA: Diagnosis not present

## 2019-03-28 DIAGNOSIS — F1010 Alcohol abuse, uncomplicated: Secondary | ICD-10-CM | POA: Diagnosis present

## 2019-03-28 DIAGNOSIS — R7303 Prediabetes: Secondary | ICD-10-CM | POA: Diagnosis present

## 2019-03-28 DIAGNOSIS — D509 Iron deficiency anemia, unspecified: Secondary | ICD-10-CM | POA: Diagnosis present

## 2019-03-28 DIAGNOSIS — I959 Hypotension, unspecified: Secondary | ICD-10-CM | POA: Diagnosis not present

## 2019-03-28 DIAGNOSIS — J441 Chronic obstructive pulmonary disease with (acute) exacerbation: Secondary | ICD-10-CM | POA: Diagnosis not present

## 2019-03-28 DIAGNOSIS — F1011 Alcohol abuse, in remission: Secondary | ICD-10-CM | POA: Diagnosis present

## 2019-03-28 DIAGNOSIS — I471 Supraventricular tachycardia: Secondary | ICD-10-CM | POA: Diagnosis not present

## 2019-03-28 DIAGNOSIS — R0600 Dyspnea, unspecified: Secondary | ICD-10-CM

## 2019-03-28 DIAGNOSIS — R5381 Other malaise: Secondary | ICD-10-CM

## 2019-03-28 DIAGNOSIS — N189 Chronic kidney disease, unspecified: Secondary | ICD-10-CM | POA: Diagnosis present

## 2019-03-28 DIAGNOSIS — I129 Hypertensive chronic kidney disease with stage 1 through stage 4 chronic kidney disease, or unspecified chronic kidney disease: Secondary | ICD-10-CM | POA: Diagnosis present

## 2019-03-28 DIAGNOSIS — N410 Acute prostatitis: Secondary | ICD-10-CM

## 2019-03-28 DIAGNOSIS — R6520 Severe sepsis without septic shock: Secondary | ICD-10-CM

## 2019-03-28 DIAGNOSIS — R0603 Acute respiratory distress: Secondary | ICD-10-CM | POA: Diagnosis not present

## 2019-03-28 DIAGNOSIS — A4153 Sepsis due to Serratia: Secondary | ICD-10-CM | POA: Diagnosis present

## 2019-03-28 DIAGNOSIS — Z20822 Contact with and (suspected) exposure to covid-19: Secondary | ICD-10-CM | POA: Diagnosis present

## 2019-03-28 DIAGNOSIS — N200 Calculus of kidney: Secondary | ICD-10-CM

## 2019-03-28 DIAGNOSIS — E872 Acidosis, unspecified: Secondary | ICD-10-CM

## 2019-03-28 HISTORY — DX: Unspecified atrial fibrillation: I48.91

## 2019-03-28 HISTORY — DX: Chronic obstructive pulmonary disease, unspecified: J44.9

## 2019-03-28 LAB — URINALYSIS, COMPLETE (UACMP) WITH MICROSCOPIC
Bilirubin Urine: NEGATIVE
Glucose, UA: NEGATIVE mg/dL
Ketones, ur: NEGATIVE mg/dL
Nitrite: POSITIVE — AB
Protein, ur: 30 mg/dL — AB
RBC / HPF: >50 RBC/hpf — ABNORMAL HIGH (ref 0–5)
Specific Gravity, Urine: 1.013 (ref 1.005–1.030)
Squamous Epithelial / HPF: NONE SEEN (ref 0–5)
WBC, UA: >50 WBC/hpf — ABNORMAL HIGH (ref 0–5)
pH: 5 (ref 5.0–8.0)

## 2019-03-28 LAB — CBC
HCT: 28.1 % — ABNORMAL LOW (ref 39.0–52.0)
Hemoglobin: 8.4 g/dL — ABNORMAL LOW (ref 13.0–17.0)
MCH: 26 pg (ref 26.0–34.0)
MCHC: 29.9 g/dL — ABNORMAL LOW (ref 30.0–36.0)
MCV: 87 fL (ref 80.0–100.0)
Platelets: 177 10*3/uL (ref 150–400)
RBC: 3.23 MIL/uL — ABNORMAL LOW (ref 4.22–5.81)
RDW: 19.6 % — ABNORMAL HIGH (ref 11.5–15.5)
WBC: 27.6 10*3/uL — ABNORMAL HIGH (ref 4.0–10.5)
nRBC: 0.1 % (ref 0.0–0.2)

## 2019-03-28 LAB — BASIC METABOLIC PANEL
Anion gap: 23 — ABNORMAL HIGH (ref 5–15)
BUN: 27 mg/dL — ABNORMAL HIGH (ref 8–23)
CO2: 12 mmol/L — ABNORMAL LOW (ref 22–32)
Calcium: 8.7 mg/dL — ABNORMAL LOW (ref 8.9–10.3)
Chloride: 104 mmol/L (ref 98–111)
Creatinine, Ser: 3.96 mg/dL — ABNORMAL HIGH (ref 0.61–1.24)
GFR calc Af Amer: 17 mL/min — ABNORMAL LOW (ref >60)
GFR calc non Af Amer: 14 mL/min — ABNORMAL LOW (ref >60)
Glucose, Bld: 99 mg/dL (ref 70–99)
Potassium: 3.4 mmol/L — ABNORMAL LOW (ref 3.5–5.1)
Sodium: 139 mmol/L (ref 135–145)

## 2019-03-28 LAB — LACTIC ACID, PLASMA
Lactic Acid, Venous: >11 mmol/L (ref 0.5–1.9)
Lactic Acid, Venous: 9.2 mmol/L (ref 0.5–1.9)
Lactic Acid, Venous: 8.6 mmol/L (ref 0.5–1.9)

## 2019-03-28 LAB — IRON AND TIBC
Iron: 6 ug/dL — ABNORMAL LOW (ref 45–182)
Saturation Ratios: 6 % — ABNORMAL LOW (ref 17.9–39.5)
TIBC: 106 ug/dL — ABNORMAL LOW (ref 250–450)
UIBC: 100 ug/dL

## 2019-03-28 LAB — VITAMIN B12: Vitamin B-12: 309 pg/mL (ref 180–914)

## 2019-03-28 LAB — FERRITIN: Ferritin: 14 ng/mL — ABNORMAL LOW (ref 24–336)

## 2019-03-28 LAB — SARS CORONAVIRUS 2 (TAT 6-24 HRS): SARS Coronavirus 2: NEGATIVE

## 2019-03-28 MED ORDER — SODIUM CHLORIDE 0.9 % IV SOLN
1.0000 g | INTRAVENOUS | Status: DC
  Administered 2019-03-29 – 2019-04-02 (×5): 1 g via INTRAVENOUS
  Filled 2019-03-28 (×2): qty 10
  Filled 2019-03-28: qty 1
  Filled 2019-03-28: qty 10
  Filled 2019-03-28: qty 1
  Filled 2019-03-28: qty 10

## 2019-03-28 MED ORDER — ENOXAPARIN SODIUM 30 MG/0.3ML ~~LOC~~ SOLN
30.0000 mg | SUBCUTANEOUS | Status: DC
  Administered 2019-03-28 – 2019-03-30 (×3): 30 mg via SUBCUTANEOUS
  Filled 2019-03-28 (×4): qty 0.3

## 2019-03-28 MED ORDER — SODIUM BICARBONATE-DEXTROSE 150-5 MEQ/L-% IV SOLN
150.0000 meq | INTRAVENOUS | Status: DC
  Administered 2019-03-28: 150 meq via INTRAVENOUS
  Filled 2019-03-28 (×3): qty 1000

## 2019-03-28 MED ORDER — IPRATROPIUM-ALBUTEROL 0.5-2.5 (3) MG/3ML IN SOLN
3.0000 mL | RESPIRATORY_TRACT | Status: DC | PRN
  Administered 2019-03-28 – 2019-03-30 (×6): 3 mL via RESPIRATORY_TRACT
  Filled 2019-03-28 (×6): qty 3

## 2019-03-28 MED ORDER — ACETAMINOPHEN 650 MG RE SUPP: 650.0000 mg | Freq: Four times a day (QID) | RECTAL | Status: DC | PRN

## 2019-03-28 MED ORDER — ACETAMINOPHEN 325 MG PO TABS
650.0000 mg | ORAL_TABLET | Freq: Four times a day (QID) | ORAL | Status: DC | PRN
  Administered 2019-03-29: 650 mg via ORAL
  Filled 2019-03-28: qty 2

## 2019-03-28 MED ORDER — POTASSIUM CHLORIDE IN NACL 20-0.9 MEQ/L-% IV SOLN: INTRAVENOUS | Status: DC

## 2019-03-28 MED ORDER — FINASTERIDE 5 MG PO TABS
5.0000 mg | ORAL_TABLET | Freq: Every day | ORAL | Status: DC
  Administered 2019-03-28 – 2019-04-02 (×6): 5 mg via ORAL
  Filled 2019-03-28 (×7): qty 1

## 2019-03-28 MED ORDER — FOLIC ACID 1 MG PO TABS: 1.0000 mg | ORAL_TABLET | Freq: Every day | ORAL | Status: DC

## 2019-03-28 MED ORDER — SODIUM CHLORIDE 0.9 % IV BOLUS
1000.0000 mL | Freq: Once | INTRAVENOUS | Status: AC
  Administered 2019-03-28: 1000 mL via INTRAVENOUS

## 2019-03-28 MED ORDER — LACTATED RINGERS IV BOLUS (SEPSIS)
1000.0000 mL | Freq: Once | INTRAVENOUS | Status: AC
  Administered 2019-03-28: 1000 mL via INTRAVENOUS

## 2019-03-28 MED ORDER — THIAMINE HCL 100 MG/ML IJ SOLN: 100.0000 mg | Freq: Every day | INTRAMUSCULAR | Status: DC

## 2019-03-28 MED ORDER — SODIUM BICARBONATE-DEXTROSE 150-5 MEQ/L-% IV SOLN
150.0000 meq | INTRAVENOUS | Status: DC
  Filled 2019-03-28 (×3): qty 1000

## 2019-03-28 MED ORDER — SODIUM CHLORIDE 0.9 % IV BOLUS
500.0000 mL | Freq: Once | INTRAVENOUS | Status: AC
  Administered 2019-03-28: 500 mL via INTRAVENOUS

## 2019-03-28 MED ORDER — ONDANSETRON HCL 4 MG/2ML IJ SOLN: 4.0000 mg | Freq: Four times a day (QID) | INTRAMUSCULAR | Status: DC | PRN

## 2019-03-28 MED ORDER — LORAZEPAM 2 MG/ML IJ SOLN: 1.0000 mg | INTRAMUSCULAR | Status: DC | PRN

## 2019-03-28 MED ORDER — ENOXAPARIN SODIUM 40 MG/0.4ML ~~LOC~~ SOLN
30.0000 mg | SUBCUTANEOUS | Status: DC
  Filled 2019-03-28: qty 0.4

## 2019-03-28 MED ORDER — LORAZEPAM 1 MG PO TABS: 1.0000 mg | ORAL_TABLET | ORAL | Status: DC | PRN

## 2019-03-28 MED ORDER — THIAMINE HCL 100 MG PO TABS: 100.0000 mg | ORAL_TABLET | Freq: Every day | ORAL | Status: DC

## 2019-03-28 MED ORDER — ONDANSETRON HCL 4 MG PO TABS: 4.0000 mg | ORAL_TABLET | Freq: Four times a day (QID) | ORAL | Status: DC | PRN

## 2019-03-28 MED ORDER — SODIUM CHLORIDE 0.9 % IV SOLN
2.0000 g | Freq: Once | INTRAVENOUS | Status: AC
  Administered 2019-03-28: 2 g via INTRAVENOUS
  Filled 2019-03-28: qty 20

## 2019-03-28 MED ORDER — SODIUM CHLORIDE 0.9 % IV SOLN: Freq: Once | INTRAVENOUS | Status: DC

## 2019-03-28 MED ORDER — ADULT MULTIVITAMIN W/MINERALS CH: 1.0000 | ORAL_TABLET | Freq: Every day | ORAL | Status: DC

## 2019-03-28 NOTE — Progress Notes (Signed)
Fluid requirements for sepsis protocol met

## 2019-03-28 NOTE — ED Notes (Signed)
Initial antibiotic dose started prior to order for Blood Cultures.

## 2019-03-28 NOTE — Progress Notes (Signed)
CODE SEPSIS - PHARMACY COMMUNICATION  **Broad Spectrum Antibiotics should be administered within 1 hour of Sepsis diagnosis**  Time Code Sepsis Called/Page Received: 1634  Antibiotics Ordered: Rocephin  Time of 1st antibiotic administration: 1600  Additional action taken by pharmacy: none  If necessary, Name of Provider/Nurse Contacted: n/a    Bettey Costa ,PharmD Clinical Pharmacist  03/28/2019  4:46 PM

## 2019-03-28 NOTE — ED Triage Notes (Signed)
Pt states he has been having a hard time passing urine for the past couple of days, states he has a hx of enlarged prostate.. states he has only been able to pass a couple drops at a time.

## 2019-03-28 NOTE — Progress Notes (Signed)
Notified bedside nurse of need to draw repeat lactic acid @ 1746 and pt still short 300 cc fluid for sepsis protocal.

## 2019-03-28 NOTE — ED Provider Notes (Signed)
[facility] Emergency Department Provider Note    First MD Initiated Contact with Patient 03/28/19 1442     (approximate)  I have reviewed the triage vital signs and the nursing notes.   HISTORY  Chief Complaint Urinary Retention    HPI Marvin Ortega is a 69 y.o. male below listed past medical history presents to the ER for evaluation of feel that his belly is distended having difficulty emptying his bladder with some nausea over the past 3 days.  No measured fevers.  Has had issues with an enlarged prostate in the past.  No recent antibiotics.  Patient found to have acute urinary retention with Foley catheter inserted and patient feels much improved.  Denies any chest pain or shortness of breath.    Past Medical History:  Diagnosis Date  . Atrial fibrillation (HCC)   . BPH (benign prostatic hyperplasia)   . COPD (chronic obstructive pulmonary disease) (HCC)   . Dieulafoy lesion of stomach   . GERD (gastroesophageal reflux disease)   . GIB (gastrointestinal bleeding) 02/15/2018  . Hemorrhagic shock (HCC)   . History of kidney stones   . Hypertension   . Pre-diabetes   . Upper GI bleed    Family History  Problem Relation Age of Onset  . Heart disease Father   . Aneurysm Sister    Past Surgical History:  Procedure Laterality Date  . CYSTOSCOPY W/ RETROGRADES Right 11/23/2018   Procedure: CYSTOSCOPY WITH RETROGRADE PYELOGRAM;  Surgeon: Riki Altes, MD;  Location: [facility] ORS;  Service: Urology;  Laterality: Right;  . CYSTOSCOPY WITH HOLMIUM LASER LITHOTRIPSY  11/30/2018   Procedure: CYSTOSCOPY WITH HOLMIUM LASER LITHOTRIPSY;  Surgeon: Riki Altes, MD;  Location: [facility] ORS;  Service: Urology;;  . Bluford Kaufmann WITH LITHOLAPAXY N/A 11/23/2018   Procedure: CYSTOSCOPY WITH LITHOLAPAXY;  Surgeon: Riki Altes, MD;  Location: [facility] ORS;  Service: Urology;  Laterality: N/A;  . CYSTOSCOPY WITH LITHOLAPAXY N/A 11/30/2018   Procedure: CYSTOSCOPY WITH  LITHOLAPAXY;  Surgeon: Riki Altes, MD;  Location: [facility] ORS;  Service: Urology;  Laterality: N/A;  . CYSTOSCOPY/URETEROSCOPY/HOLMIUM LASER/STENT PLACEMENT Right 11/23/2018   Procedure: CYSTOSCOPY/URETEROSCOPY/HOLMIUM LASER/STENT PLACEMENT;  Surgeon: Riki Altes, MD;  Location: [facility] ORS;  Service: Urology;  Laterality: Right;  . ESOPHAGOGASTRODUODENOSCOPY N/A 02/15/2018   Procedure: ESOPHAGOGASTRODUODENOSCOPY (EGD);  Surgeon: Toney Reil, MD;  Location: [facility] ENDOSCOPY;  Service: Gastroenterology;  Laterality: N/A;   Patient Active Problem List   Diagnosis Date Noted  . Acute renal failure (ARF) (HCC) 03/28/2019  . COPD (chronic obstructive pulmonary disease) (HCC) 03/28/2019  . Acidosis 03/28/2019  . Normocytic anemia 03/28/2019  . Bladder calculus 11/30/2018  . C. difficile colitis 11/25/2018  . Renal calculus 11/23/2018  . Ureteral calculi 10/29/2018  . Calculus of bladder 10/29/2018  . Nephrolithiasis 10/29/2018  . Gross hematuria 09/28/2018  . PAF (paroxysmal atrial fibrillation) (HCC) 02/25/2018  . Benign prostatic hyperplasia with lower urinary tract symptoms 12/08/2013  . Essential (primary) hypertension 12/08/2013  . AA (alcohol abuse) 12/08/2013  . GERD (gastroesophageal reflux disease) 02/13/2011  . Hyperlipidemia 06/20/2010      Prior to Admission medications   Medication Sig Start Date End Date Taking? Authorizing Provider  COMBIVENT RESPIMAT 20-100 MCG/ACT AERS respimat Inhale 2 puffs into the lungs 4 (four) times daily as needed for shortness of breath. 02/21/19  Yes [provider]  finasteride (PROSCAR) 5 MG tablet Take 5 mg by mouth daily.   Yes [provider]  metoprolol succinate (  TOPROL-XL) 50 MG 24 hr tablet Take 100 mg by mouth daily.    Yes [provider]  pantoprazole (PROTONIX) 40 MG tablet Take 1 tablet (40 mg total) by mouth 2 (two) times daily. 02/19/18  Yes Mayo, Allyn Kenner, MD  Thiamine HCl (VITAMIN B-1) 250  MG tablet Take 250 mg by mouth daily.   Yes [provider]    Allergies Patient has no known allergies.    Social History Social History   Tobacco Use  . Smoking status: Never Smoker  . Smokeless tobacco: Never Used  Substance Use Topics  . Alcohol use: Yes  . Drug use: Never    Review of Systems Patient denies headaches, rhinorrhea, blurry vision, numbness, shortness of breath, chest pain, edema, cough, abdominal pain, nausea, vomiting, diarrhea, dysuria, fevers, rashes or hallucinations unless otherwise stated above in HPI. ____________________________________________   PHYSICAL EXAM:  VITAL SIGNS: Vitals:   03/28/19 2030 03/28/19 2112  BP: 96/77 103/65  Pulse: (!) 118 (!) 113  Resp: (!) 25 (!) 26  Temp:    SpO2: 100% 100%    Constitutional: Alert and oriented.  Eyes: Conjunctivae are normal.  Head: Atraumatic. Nose: No congestion/rhinnorhea. Mouth/Throat: Mucous membranes are moist.   Neck: No stridor. Painless ROM.  Cardiovascular: Normal rate, regular rhythm. Grossly normal heart sounds.  Good peripheral circulation. Respiratory: Normal respiratory effort.  No retractions. Lungs CTAB. Gastrointestinal: Soft and nontender. No distention. No abdominal bruits. No CVA tenderness. Genitourinary: tender enlarged prostate Musculoskeletal: No lower extremity tenderness nor edema.  No joint effusions. Neurologic:  Normal speech and language. No gross focal neurologic deficits are appreciated. No facial droop Skin:  Skin is warm, dry and intact. No rash noted. Psychiatric: Mood and affect are normal. Speech and behavior are normal.  ____________________________________________   LABS (all labs ordered are listed, but only abnormal results are displayed)  Results for orders placed or performed during the hospital encounter of 03/28/19 (from the past 24 hour(s))  Urinalysis, Complete w Microscopic     Status: Abnormal   Collection Time: 03/28/19  1:58 PM    Result Value Ref Range   Color, Urine YELLOW (A) YELLOW   APPearance TURBID (A) CLEAR   Specific Gravity, Urine 1.013 1.005 - 1.030   pH 5.0 5.0 - 8.0   Glucose, UA NEGATIVE NEGATIVE mg/dL   Hgb urine dipstick SMALL (A) NEGATIVE   Bilirubin Urine NEGATIVE NEGATIVE   Ketones, ur NEGATIVE NEGATIVE mg/dL   Protein, ur 30 (A) NEGATIVE mg/dL   Nitrite POSITIVE (A) NEGATIVE   Leukocytes,Ua MODERATE (A) NEGATIVE   RBC / HPF >50 (H) 0 - 5 RBC/hpf   WBC, UA >50 (H) 0 - 5 WBC/hpf   Bacteria, UA MANY (A) NONE SEEN   Squamous Epithelial / LPF NONE SEEN 0 - 5   WBC Clumps PRESENT    Mucus PRESENT   Basic metabolic panel     Status: Abnormal   Collection Time: 03/28/19  1:58 PM  Result Value Ref Range   Sodium 139 135 - 145 mmol/L   Potassium 3.4 (L) 3.5 - 5.1 mmol/L   Chloride 104 98 - 111 mmol/L   CO2 12 (L) 22 - 32 mmol/L   Glucose, Bld 99 70 - 99 mg/dL   BUN 27 (H) 8 - 23 mg/dL   Creatinine, Ser 8.75 (H) 0.61 - 1.24 mg/dL   Calcium 8.7 (L) 8.9 - 10.3 mg/dL   GFR calc non Af Amer 14 (L) >60 mL/min   GFR  calc Af Amer 17 (L) >60 mL/min   Anion gap 23 (H) 5 - 15  CBC     Status: Abnormal   Collection Time: 03/28/19  1:58 PM  Result Value Ref Range   WBC 27.6 (H) 4.0 - 10.5 K/uL   RBC 3.23 (L) 4.22 - 5.81 MIL/uL   Hemoglobin 8.4 (L) 13.0 - 17.0 g/dL   HCT 09.7 (L) 35.3 - 29.9 %   MCV 87.0 80.0 - 100.0 fL   MCH 26.0 26.0 - 34.0 pg   MCHC 29.9 (L) 30.0 - 36.0 g/dL   RDW 24.2 (H) 68.3 - 41.9 %   Platelets 177 150 - 400 K/uL   nRBC 0.1 0.0 - 0.2 %  Lactic acid, plasma     Status: Abnormal   Collection Time: 03/28/19  3:46 PM  Result Value Ref Range   Lactic Acid, Venous >11.0 (HH) 0.5 - 1.9 mmol/L  Vitamin B12     Status: None   Collection Time: 03/28/19  4:39 PM  Result Value Ref Range   Vitamin B-12 309 180 - 914 pg/mL  Iron and TIBC     Status: Abnormal   Collection Time: 03/28/19  4:50 PM  Result Value Ref Range   Iron 6 (L) 45 - 182 ug/dL   TIBC 622 (L) 297 - 989 ug/dL    Saturation Ratios 6 (L) 17.9 - 39.5 %   UIBC 100 ug/dL  Ferritin     Status: Abnormal   Collection Time: 03/28/19  4:50 PM  Result Value Ref Range   Ferritin 14 (L) 24 - 336 ng/mL  Lactic acid, plasma     Status: Abnormal   Collection Time: 03/28/19  7:30 PM  Result Value Ref Range   Lactic Acid, Venous 9.2 (HH) 0.5 - 1.9 mmol/L   ____________________________________________ ____________________________________________  RADIOLOGY  I personally reviewed all radiographic images ordered to evaluate for the above acute complaints and reviewed radiology reports and findings.  These findings were personally discussed with the patient.  Please see medical record for radiology report.  ____________________________________________   PROCEDURES  Procedure(s) performed:  .Critical Care Performed by: Willy Eddy, MD Authorized by: Willy Eddy, MD   Critical care provider statement:    Critical care time (minutes):  34   Critical care time was exclusive of:  Separately billable procedures and treating other patients   Critical care was necessary to treat or prevent imminent or life-threatening deterioration of the following conditions:  Renal failure   Critical care was time spent personally by me on the following activities:  Development of treatment plan with patient or surrogate, discussions with consultants, evaluation of patient's response to treatment, examination of patient, obtaining history from patient or surrogate, ordering and performing treatments and interventions, ordering and review of laboratory studies, ordering and review of radiographic studies, pulse oximetry, re-evaluation of patient's condition and review of old charts      Critical Care performed: yes ____________________________________________   INITIAL IMPRESSION / ASSESSMENT AND PLAN / ED COURSE  Pertinent labs & imaging results that were available during my care of the patient were reviewed by  me and considered in my medical decision making (see chart for details).   DDX: aki, prostatitis, urinary retention, sepsis, dehydration  Marvin Ortega is a 69 y.o. who presents to the ED with was as described above.  Afebrile but mildly hypertensive has evidence of acute urinary retention.  Blood work does show significantly elevated white count as well is acute kidney  injury.  His symptoms of discomfort were improved after decompression with Foley catheter.  The patient will be placed on continuous pulse oximetry and telemetry for monitoring.  Laboratory evaluation will be sent to evaluate for the above complaints.     Clinical Course as of Mar 27 2126  Mon Mar 28, 2019  1514 On exam patient does have tender prostate it is enlarged.  I do not feel any nodules or fluctuance to suggest abscess.  Will order IV abx.  We will continue IV fluids   [PR]    Clinical Course User Index [PR] Merlyn Lot, MD    The patient was evaluated in Emergency Department today for the symptoms described in the history of present illness. He/she was evaluated in the context of the global COVID-19 pandemic, which necessitated consideration that the patient might be at risk for infection with the SARS-CoV-2 virus that causes COVID-19. Institutional protocols and algorithms that pertain to the evaluation of patients at risk for COVID-19 are in a state of rapid change based on information released by regulatory bodies including the CDC and federal and state organizations. These policies and algorithms were followed during the patient's care in the ED.  As part of my medical decision making, I reviewed the following data within the [place] notes reviewed and incorporated, Labs reviewed, notes from prior ED visits and [place] Controlled Substance Database   ____________________________________________   FINAL CLINICAL IMPRESSION(S) / ED DIAGNOSES  Final diagnoses:  Urinary retention    Acute prostatitis      NEW MEDICATIONS STARTED DURING THIS VISIT:  New Prescriptions   No medications on file     Note:  This document was prepared using Dragon voice recognition software and may include unintentional dictation errors.    Merlyn Lot, MD 03/28/19 2128

## 2019-03-28 NOTE — ED Notes (Signed)
Lab at bedside

## 2019-03-28 NOTE — H&P (Signed)
History and Physical  Patient Name: Marvin Ortega     WGN:[id number]    DOB: 1951/01/27    DOA: 03/28/2019 PCP: Kandyce Rud, MD  Patient coming from: Home  Chief Complaint: Urinary retention, malaise      HPI: Marvin Ortega is a 69 y.o. M with hx of BPH, kidney stones status post staged lithotripsy last fall c/b C. difficile, HTN, COPD, and remote history of alcohol use disorder now in remission, UGIB, and paroxysmal A. fib not on anticoagulants who presented with urinary retention, malaise for 3 days, no weak and confused.  Patient has chronic LUTS, but over the last several days he has noticed complete urine retention, just a few drips.  Today he could not get his pants on because his belly was distended, and he started to feel confused, malaise, and dizzy so he came to the ER.  In the ER, afebrile, HR 106, blood pressure 90/60, WBC 27.6K, hemoglobin 8.4, normocytic, and creatinine 3.96, up from baseline 1.1.  Lactic acid >11.  He was given ceftriaxone, IV fluids, and the hospitalist service were asked to evaluate for renal failure.            ROS: Review of Systems  Constitutional: Positive for malaise/fatigue. Negative for fever.  Respiratory: Negative for cough and shortness of breath.   Genitourinary: Negative for dysuria, flank pain, frequency, hematuria and urgency.       Urinary retention  Neurological: Positive for weakness.  Psychiatric/Behavioral:       Confusion  All other systems reviewed and are negative.         Past Medical History:  Diagnosis Date  . Atrial fibrillation (HCC)   . BPH (benign prostatic hyperplasia)   . COPD (chronic obstructive pulmonary disease) (HCC)   . Dieulafoy lesion of stomach   . GERD (gastroesophageal reflux disease)   . GIB (gastrointestinal bleeding) 02/15/2018  . Hemorrhagic shock (HCC)   . History of kidney stones   . Hypertension   . Pre-diabetes   . Upper GI bleed     Past Surgical History:    Procedure Laterality Date  . CYSTOSCOPY W/ RETROGRADES Right 11/23/2018   Procedure: CYSTOSCOPY WITH RETROGRADE PYELOGRAM;  Surgeon: Riki Altes, MD;  Location: [facility];  Service: Urology;  Laterality: Right;  . CYSTOSCOPY WITH HOLMIUM LASER LITHOTRIPSY  11/30/2018   Procedure: CYSTOSCOPY WITH HOLMIUM LASER LITHOTRIPSY;  Surgeon: Riki Altes, MD;  Location: [facility];  Service: Urology;;  . Bluford Kaufmann WITH LITHOLAPAXY N/A 11/23/2018   Procedure: CYSTOSCOPY WITH LITHOLAPAXY;  Surgeon: Riki Altes, MD;  Location: [facility];  Service: Urology;  Laterality: N/A;  . CYSTOSCOPY WITH LITHOLAPAXY N/A 11/30/2018   Procedure: CYSTOSCOPY WITH LITHOLAPAXY;  Surgeon: Riki Altes, MD;  Location: [facility];  Service: Urology;  Laterality: N/A;  . CYSTOSCOPY/URETEROSCOPY/HOLMIUM LASER/STENT PLACEMENT Right 11/23/2018   Procedure: CYSTOSCOPY/URETEROSCOPY/HOLMIUM LASER/STENT PLACEMENT;  Surgeon: Riki Altes, MD;  Location: [facility];  Service: Urology;  Laterality: Right;  . ESOPHAGOGASTRODUODENOSCOPY N/A 02/15/2018   Procedure: ESOPHAGOGASTRODUODENOSCOPY (EGD);  Surgeon: Toney Reil, MD;  Location: [facility];  Service: Gastroenterology;  Laterality: N/A;    Social History: Patient lives alone in an apartment.  The patient walks unassisted.  None smoker.  Denies alcohol use.  No Known Allergies  Family history: family history includes Aneurysm in his sister; Heart disease in his father.  No renal failure.  Prior to Admission medications   Medication Sig Start Date End Date Taking? Authorizing Provider  COMBIVENT RESPIMAT 20-100 MCG/ACT AERS respimat Inhale 2 puffs into the lungs 4 (four) times daily as needed for shortness of breath. 02/21/19  Yes [provider]  finasteride (PROSCAR) 5 MG tablet Take 5 mg by mouth daily.   Yes [provider]  metoprolol succinate (TOPROL-XL) 50 MG 24 hr tablet Take 100 mg by mouth daily.    Yes [provider]   pantoprazole (PROTONIX) 40 MG tablet Take 1 tablet (40 mg total) by mouth 2 (two) times daily. 02/19/18  Yes Mayo, Allyn Kenner, MD  Thiamine HCl (VITAMIN B-1) 250 MG tablet Take 250 mg by mouth daily.   Yes [provider]       Physical Exam: BP 92/66   Pulse (!) 106   Temp 98.2 F (36.8 C)   Resp 17   Ht 5\' 6"  (1.676 m)   Wt 63 kg   SpO2 95%   BMI 22.44 kg/m  General appearance: Thin adult male, awake and in no obvious distress, appears tired.   Eyes: Anicteric, conjunctiva pink, lids and lashes normal. PERRL.    ENT: No nasal deformity, discharge, epistaxis.  Hearing slightly diminished. OP tacky dry without lesions.  Mostly edentulous. Neck: No neck masses.  Trachea midline.  No thyromegaly/tenderness. Lymph: No cervical or supraclavicular lymphadenopathy. Skin: Warm and dry.    No suspicious rashes or lesions on the face, back, upper chest, abdomen, arms, or legs. Cardiac: Tachycardic, regular, nl S1-S2, no murmurs appreciated.  Capillary refill is brisk without mottling.  JVP normal.  No LE edema.  Radial pulses 2+ and symmetric. Respiratory: Normal respiratory rate and rhythm.  CTAB without rales or wheezes. Abdomen: Abdomen soft.  Fairly distended, no focal tenderness to palpation or guarding.  No hepatosplenomegaly.   MSK: No deformities or effusions of the large joints of the upper or lower extremities bilaterally.  No cyanosis or clubbing.  Normal muscle bulk and tone. Neuro: Cranial nerves 3 through 12 intact.  Sensation intact to light touch. Speech is fluent.  Muscle strength 4/5, generalized weakness, symmetric.    Psych: Sensorium intact and responding to questions, attention diminished, psychomotor slowing noted, affect blunted, judgment and insight appear moderately impaired.     Sepsis - Repeat Assessment  Performed at:    1724    Vitals     Blood pressure 90/73, pulse (!) 110, temperature 98.2 F (36.8 C), resp. rate 17, height 5\' 6"  (1.676 m), weight  63 kg, SpO2 98 %.  Heart:     Tachycardic  Lungs:    CTA  Capillary Refill:   <2 sec  Peripheral Pulse:   Radial pulse palpable  Skin:     Normal Color         Labs on Admission:  I have personally reviewed following labs and imaging studies: CBC: Recent Labs  Lab 03/28/19 1358  WBC 27.6*  HGB 8.4*  HCT 28.1*  MCV 87.0  PLT 177   Basic Metabolic Panel: Recent Labs  Lab 03/28/19 1358  NA 139  K 3.4*  CL 104  CO2 12*  GLUCOSE 99  BUN 27*  CREATININE 3.96*  CALCIUM 8.7*   GFR: Estimated Creatinine Clearance: 15.5 mL/min (A) (by C-G formula based on SCr of 3.96 mg/dL (H)).     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 Tiny echogenic foci without definite shadowing, no well-defined stones are identified. No renal mass noted. No hydronephrosis. Left Kidney: Renal measurements: 11.6 by 5.8 by 5.7 cm = volume: 200.6 mL. Large stone in the left kidney lower pole corresponding to previous findings at CT, approximately 1.6 cm in long axis. Normal renal echogenicity. Exophytic 1.4 cm cyst from the left kidney lower pole. Bladder: The urinary bladder is collapsed around the Foley catheter. Other: None. IMPRESSION: 1. 1.6 cm left kidney lower pole staghorn calculus as shown on prior CT. The smaller upper pole calculi previously present bilaterally on CT are not readily visible on today's ultrasound. 2. The urinary bladder appears collapsed around the Foley catheter. The very large bladder calculus shown on 10/07/2018 is not identified today. 3. No hydronephrosis.  Normal renal  echogenicity. Electronically Signed   By: Van Clines M.D.   On: 03/28/2019 16:39           Assessment/Plan   Sepsis due to UTI Patient presents with blood pressure systolic 90, tachycardia, leukocytosis as well as renal failure, confusion, and lactic acid greater than 10.  Source clearly urinary.  Prostate was large and slightly tender on exam, but not boggy, doubt prostatitis. -30 cc per cake IV fluids administered in the ER -Trend lactate -Continue ceftriaxone -Follow blood and urine cultures   Acute renal failure Patient's baseline creatinine is 1.1.    At present he has postobstructive uropathy due to BPH.  Foley placed in ER.  Discussed with urology, Dr. Jeb Levering, no role for surgical intervention for staghorn calculus or BPH until after infection is resolved, no obstructing stone appears evident on ultrasound. -IV fluids -Strict I/Os -Trend Cr -Maintain foley -Follow upw with Urology in 2 weeks -Avoid hypotension, nephrotoxins  BPH Longstanding BPH.  Had been offered prostate surgery last fall, patient declined.  Has been on finasteride, had foley temporarily last fall, removed since October. -Continue finasteride -Follow up with Dr. Bernardo Heater after discharge, within 2 weeks  Hypertension Hypotensive -Hold metoprolol 50 daily  Emphysema No wheezing -Continue Combivent  Anemia, normocytic Unclear cause.  Baseline last fall appears to be in the 8s.   Other cell lines normal.  CKD? -Check iron stores -Check B12 -Check retics, smear  History of atrial fibrillation This is a chart history and his PCPs notes, he is not on anticoagulation, I am unclear why. -Obtain ECG     DVT prophylaxis: Lovenox  Code Status: FULL  Family Communication: Attempted to call niece, no answer  Disposition Plan: Anticipate IV fluids, trend creatinine, likely several days.  Will dsicharge when cultures speciate and renal funciton returns towards normal Consults called:  Urology, they recommend maintain foley at [place], outpatietn follow up, no in hospital treatments needed Admission status: INPATIENT   At the time of admission, it appears that the appropriate admission status for this patient is INPATIENT. This is judged to be reasonable and necessary in order to provide the required intensity of service to ensure the patient's safety given: -presenting symptoms of confusion, weakness, urinary retention -physical exam findings of low blood pressure, psychomotor slwoing, confusion, and  -initial radiographic and laboratory data elevated lactic acid, leukocytosis, pyuria, elevated creatinine, acidosis, anemia     Together, these circumstances are felt to place him at high risk  for further clinical deterioration threatening life, limb, or organ requiring a high intensity of service due to this acute illness that poses a threat to life, limb or bodily function.  I certify that at the point of admission it is my clinical judgment that the patient will require inpatient hospital care spanning beyond 2 midnights from the point of admission and that early discharge would result in unnecessary risk of decompensation and readmission or threat to life, limb or bodily function.       Medical decision making: Patient seen at 4:52 PM on 03/28/2019.  The patient was discussed with Dr. Roxan Hockey.  What exists of the patient's chart was reviewed in depth and summarized above.  Clinical condition: stable hemodynamically.        Earl Lites Mellanie Bejarano [facility] Please page though AMION or Epic secure chat:  For password, contact charge nurse

## 2019-03-28 NOTE — ED Notes (Signed)
Lab notified to collect additional blood work.

## 2019-03-28 NOTE — ED Notes (Signed)
2 attempts for IV access-unsuccessful  

## 2019-03-28 NOTE — ED Notes (Signed)
Second bladder scan reported 945 mL. This NT and Melanie, NT are inserting catheter.

## 2019-03-28 NOTE — Progress Notes (Signed)
Messaged bedside RN Victorino Dike who quickly responded over need for F/U LA, RN messaged MD on call since there were no orders

## 2019-03-28 NOTE — ED Notes (Signed)
US to bedside

## 2019-03-28 NOTE — Progress Notes (Signed)
Notified bedside nurse of need to administer fluid bolus, pt needs 1890 cc per protocal.

## 2019-03-29 ENCOUNTER — Telehealth: Payer: Self-pay | Admitting: Urology

## 2019-03-29 LAB — CBC
HCT: 22.4 % — ABNORMAL LOW (ref 39.0–52.0)
Hemoglobin: 6.8 g/dL — ABNORMAL LOW (ref 13.0–17.0)
MCH: 26.2 pg (ref 26.0–34.0)
MCHC: 30.4 g/dL (ref 30.0–36.0)
MCV: 86.2 fL (ref 80.0–100.0)
Platelets: 125 10*3/uL — ABNORMAL LOW (ref 150–400)
RBC: 2.6 MIL/uL — ABNORMAL LOW (ref 4.22–5.81)
RDW: 19.6 % — ABNORMAL HIGH (ref 11.5–15.5)
WBC: 26.3 10*3/uL — ABNORMAL HIGH (ref 4.0–10.5)
nRBC: 0 % (ref 0.0–0.2)

## 2019-03-29 LAB — BASIC METABOLIC PANEL
Anion gap: 19 — ABNORMAL HIGH (ref 5–15)
Anion gap: 17 — ABNORMAL HIGH (ref 5–15)
Anion gap: 19 — ABNORMAL HIGH (ref 5–15)
Anion gap: 18 — ABNORMAL HIGH (ref 5–15)
BUN: 29 mg/dL — ABNORMAL HIGH (ref 8–23)
BUN: 30 mg/dL — ABNORMAL HIGH (ref 8–23)
BUN: 31 mg/dL — ABNORMAL HIGH (ref 8–23)
BUN: 38 mg/dL — ABNORMAL HIGH (ref 8–23)
CO2: 14 mmol/L — ABNORMAL LOW (ref 22–32)
CO2: 20 mmol/L — ABNORMAL LOW (ref 22–32)
CO2: 18 mmol/L — ABNORMAL LOW (ref 22–32)
CO2: 24 mmol/L (ref 22–32)
Calcium: 7.6 mg/dL — ABNORMAL LOW (ref 8.9–10.3)
Calcium: 7.3 mg/dL — ABNORMAL LOW (ref 8.9–10.3)
Calcium: 7.1 mg/dL — ABNORMAL LOW (ref 8.9–10.3)
Calcium: 7.3 mg/dL — ABNORMAL LOW (ref 8.9–10.3)
Chloride: 106 mmol/L (ref 98–111)
Chloride: 104 mmol/L (ref 98–111)
Chloride: 104 mmol/L (ref 98–111)
Chloride: 99 mmol/L (ref 98–111)
Creatinine, Ser: 3.66 mg/dL — ABNORMAL HIGH (ref 0.61–1.24)
Creatinine, Ser: 3.64 mg/dL — ABNORMAL HIGH (ref 0.61–1.24)
Creatinine, Ser: 3.27 mg/dL — ABNORMAL HIGH (ref 0.61–1.24)
Creatinine, Ser: 3.35 mg/dL — ABNORMAL HIGH (ref 0.61–1.24)
GFR calc Af Amer: 18 mL/min — ABNORMAL LOW (ref >60)
GFR calc Af Amer: 18 mL/min — ABNORMAL LOW (ref >60)
GFR calc Af Amer: 21 mL/min — ABNORMAL LOW (ref >60)
GFR calc Af Amer: 20 mL/min — ABNORMAL LOW (ref >60)
GFR calc non Af Amer: 16 mL/min — ABNORMAL LOW (ref >60)
GFR calc non Af Amer: 16 mL/min — ABNORMAL LOW (ref >60)
GFR calc non Af Amer: 18 mL/min — ABNORMAL LOW (ref >60)
GFR calc non Af Amer: 18 mL/min — ABNORMAL LOW (ref >60)
Glucose, Bld: 124 mg/dL — ABNORMAL HIGH (ref 70–99)
Glucose, Bld: 138 mg/dL — ABNORMAL HIGH (ref 70–99)
Glucose, Bld: 182 mg/dL — ABNORMAL HIGH (ref 70–99)
Glucose, Bld: 148 mg/dL — ABNORMAL HIGH (ref 70–99)
Potassium: 3.2 mmol/L — ABNORMAL LOW (ref 3.5–5.1)
Potassium: 3.4 mmol/L — ABNORMAL LOW (ref 3.5–5.1)
Potassium: 3.6 mmol/L (ref 3.5–5.1)
Potassium: 3.5 mmol/L (ref 3.5–5.1)
Sodium: 139 mmol/L (ref 135–145)
Sodium: 141 mmol/L (ref 135–145)
Sodium: 141 mmol/L (ref 135–145)
Sodium: 141 mmol/L (ref 135–145)

## 2019-03-29 LAB — RETICULOCYTES
Immature Retic Fract: 18.8 % — ABNORMAL HIGH (ref 2.3–15.9)
RBC.: 2.51 MIL/uL — ABNORMAL LOW (ref 4.22–5.81)
Retic Count, Absolute: 44.7 10*3/uL (ref 19.0–186.0)
Retic Ct Pct: 1.8 % (ref 0.4–3.1)

## 2019-03-29 LAB — HEMOGLOBIN AND HEMATOCRIT, BLOOD
HCT: 27.4 % — ABNORMAL LOW (ref 39.0–52.0)
Hemoglobin: 8.9 g/dL — ABNORMAL LOW (ref 13.0–17.0)

## 2019-03-29 LAB — GLUCOSE, CAPILLARY: Glucose-Capillary: 89 mg/dL (ref 70–99)

## 2019-03-29 LAB — HIV ANTIBODY (ROUTINE TESTING W REFLEX): HIV Screen 4th Generation wRfx: NONREACTIVE

## 2019-03-29 LAB — LACTIC ACID, PLASMA
Lactic Acid, Venous: 9 mmol/L (ref 0.5–1.9)
Lactic Acid, Venous: 8.6 mmol/L (ref 0.5–1.9)
Lactic Acid, Venous: 6.6 mmol/L (ref 0.5–1.9)
Lactic Acid, Venous: 5.3 mmol/L (ref 0.5–1.9)
Lactic Acid, Venous: 7.1 mmol/L (ref 0.5–1.9)
Lactic Acid, Venous: 5.9 mmol/L (ref 0.5–1.9)

## 2019-03-29 LAB — MAGNESIUM
Magnesium: 1.3 mg/dL — ABNORMAL LOW (ref 1.7–2.4)
Magnesium: 2.2 mg/dL (ref 1.7–2.4)

## 2019-03-29 LAB — PATHOLOGIST SMEAR REVIEW

## 2019-03-29 LAB — PREPARE RBC (CROSSMATCH)

## 2019-03-29 MED ORDER — SODIUM CHLORIDE 0.9% IV SOLUTION: Freq: Once | INTRAVENOUS | Status: DC

## 2019-03-29 MED ORDER — CHLORHEXIDINE GLUCONATE CLOTH 2 % EX PADS
6.0000 | MEDICATED_PAD | Freq: Every day | CUTANEOUS | Status: DC
  Administered 2019-03-29 – 2019-04-02 (×5): 6 via TOPICAL

## 2019-03-29 MED ORDER — SODIUM CHLORIDE 0.9 % IV BOLUS: 1000.0000 mL | Freq: Once | INTRAVENOUS | Status: DC

## 2019-03-29 MED ORDER — GUAIFENESIN-DM 100-10 MG/5ML PO SYRP
5.0000 mL | ORAL_SOLUTION | ORAL | Status: DC | PRN
  Administered 2019-03-29 – 2019-04-02 (×6): 5 mL via ORAL
  Filled 2019-03-29 (×6): qty 5

## 2019-03-29 MED ORDER — LACTATED RINGERS IV BOLUS
1000.0000 mL | Freq: Once | INTRAVENOUS | Status: AC
  Administered 2019-03-29: 1000 mL via INTRAVENOUS

## 2019-03-29 MED ORDER — POTASSIUM CHLORIDE CRYS ER 20 MEQ PO TBCR
40.0000 meq | EXTENDED_RELEASE_TABLET | Freq: Once | ORAL | Status: AC
  Administered 2019-03-29: 40 meq via ORAL
  Filled 2019-03-29: qty 2

## 2019-03-29 MED ORDER — SODIUM BICARBONATE 8.4 % IV SOLN
100.0000 meq | Freq: Once | INTRAVENOUS | Status: AC
  Administered 2019-03-29: 100 meq via INTRAVENOUS
  Filled 2019-03-29: qty 50

## 2019-03-29 MED ORDER — MAGNESIUM SULFATE 4 GM/100ML IV SOLN
4.0000 g | Freq: Once | INTRAVENOUS | Status: AC
  Administered 2019-03-29: 4 g via INTRAVENOUS
  Filled 2019-03-29: qty 100

## 2019-03-29 MED ORDER — SODIUM BICARBONATE-DEXTROSE 150-5 MEQ/L-% IV SOLN
150.0000 meq | INTRAVENOUS | Status: DC
  Administered 2019-03-29: 150 meq via INTRAVENOUS
  Filled 2019-03-29 (×7): qty 1000

## 2019-03-29 MED ORDER — SODIUM BICARBONATE-DEXTROSE 150-5 MEQ/L-% IV SOLN
150.0000 meq | INTRAVENOUS | Status: DC
  Administered 2019-03-29 (×2): 150 meq via INTRAVENOUS
  Filled 2019-03-29 (×5): qty 1000

## 2019-03-29 NOTE — Telephone Encounter (Signed)
He does not need.  He had the ultrasound yesterday

## 2019-03-29 NOTE — Progress Notes (Signed)
RN called RT to patient bedside for PRN breathing treatment due to wheezing and shortness of breath. Upon assessment, patient Heart Rate 180+ bpm. RN called and notified. RN at bedside and on phone with MD. Heart rate returned to 115-125bpm, breathing treatment given. Heart rate monitored throughout treatment. Clear/diminished bilateral breath sounds, no respiratory distress noted. Patient resting comfortably in bed. Denies feeling like his heart is racing or beating fast. Will continue to monitor.

## 2019-03-29 NOTE — Progress Notes (Signed)
Patient was started on sodium bicarb infusion and treated with 2 amps sodium bicarb IVP for his metabolic acidosis.His lactic acid remains elevated, slight improvement.  Creatine now with slow improvement, likely will be slower recovery given his sequelae of acute on chronic urinary retention. Bicarb on chem panel currently at 18. Anion gap remains 19  Had episode of tachycardia accompanied by shortness of breath around 0230. Pressure noted to be lower at that time as well. No sign of fluid overload. My assessment around 0300,  Patient lying flat in bed resting comfortably and ST on monitor rate 110-118.. And again at 0630, ST on monitor rate 118. Nurse reprts just assessed patient who is breathing normal and patient feeling fine, just tired.   Estimated + fluid balance of  1548.   Hourly IVF rate decreased to 125

## 2019-03-29 NOTE — Progress Notes (Signed)
PROGRESS NOTE    Marvin Ortega  TDH:[id number] DOB: 01-13-1951 DOA: 03/28/2019 PCP: Marvin Rud, MD      Brief Narrative:  Marvin Ortega is a 69 y.o. M with hx of BPH, kidney stones status post staged lithotripsy last fall c/b C. difficile, HTN, COPD, and remote history of alcohol use disorder now in remission, UGIB, and paroxysmal A. fib not on anticoagulants who presented with urinary retention, malaise for 3 days, no weak and confused.  Patient has chronic LUTS, but over the last several days he has noticed complete urine retention, just a few drips.  Today he could not get his pants on because his belly was distended, and he started to feel confused, malaise, and dizzy so he came to the ER.  In the ER, afebrile, HR 106, blood pressure 90/60, WBC 27.6K, hemoglobin 8.4, normocytic, and creatinine 3.96, up from baseline 1.1.  Lactic acid >11.  He was given ceftriaxone, IV fluids, and the hospitalist service were asked to evaluate for renal failure.      Assessment & Plan:  Sepsis due to UTI Patient presents with blood pressure systolic 90, tachycardia, leukocytosis as well as renal failure, confusion, and lactic acid greater than 10.  Source clearly urinary.  Prostate was large and slightly tender on exam, but not boggy, doubt prostatitis.  30 cc/kg IV fluids administered in the ER.  Lactate still clearing.  -Continue ceftriaxone -Follow blood and urine cultures   Acute renal failure Anion gap metaboolic acidosis Patient's baseline creatinine is 1.1.  On admission Cr 3.96  At present he has postobstructive uropathy due to BPH.  Foley placed in ER.  Discussed with urology, Dr. Signa Ortega, no role for surgical intervention for staghorn calculus or BPH until after infection is resolved, no obstructing stone appears evident on ultrasound.  Cr slightly down today to 3.3, not that much.  Only 20 cc/hr urine output today.  No evidence of fluid overload yet.   -Consult  Nephrology -Transfuse 1 unit -Continue aggressive IV fluids with Bicarb -monitor Strict I/Os -Trend Cr -Maintain foley -Follow up with Urology in 2 weeks -Avoid hypotension, nephrotoxins   BPH Longstanding BPH.  Had been offered prostate surgery last fall, patient declined.  Has been on finasteride, had foley temporarily last fall, removed since October. -Continue  finasteride -Follow up with Dr. Lonna Ortega after discharge, within 2 weeks  Hypertension Remains severely hypotensive. -Hold metoprolol 50 daily  Emphysema No wheezing -Continue home PRN Combivent  Anemia, normocytic Unclear cause.  Baseline last fall appears to be in the 8s.   Other cell lines normal.  CKD? B12 replete, iron stores low. Hgb down to 6.8 today -Transfuse 1 unit and obtain post tx H/H -Start iron -Will need outpatient GI follow up  History of atrial fibrillation This is a chart history and his PCPs notes, he is not on anticoagulation, I am unclear why.  ECG here shows only sinus tachycardia.           Disposition: The patient was admitted with due to renal failure.   I will discharge when his renal function is starting to improve, his urine output has normalized, his anemia has been corrected with transfusion, and his culture data allows [place] to narrow to appropriate oral agent.        MDM: The below labs and imaging reports were reviewed and summarized above.  Medication management as above. This is a severe acute illness that poses a threat to life or bodily function.  DVT prophylaxis: Lovenox Code Status: Full code Family Communication:     Consultants:   Nephrology  Procedures:   1/18 renal ultrasound  Antimicrobials:   Ceftriaxone 1/18>>  Culture data:   1/18 urine culture --GNR's, species pending  1/18 blood culture x2 --no growth to date          Subjective: Feeling okay, somewhat better, good appetite, no fever overnight, no confusion, no  dizziness, no chest pain, no shortness of breath.  Objective: Vitals:   03/29/19 1412 03/29/19 1444 03/29/19 1543 03/29/19 1604  BP: (!) 90/54 104/66  (!) 84/51  Pulse: (!) 117 (!) 116  (!) 117  Resp: 16   17  Temp: 98.5 F (36.9 C) 98.4 F (36.9 C)  98.5 F (36.9 C)  TempSrc: Oral Oral  Oral  SpO2: 97% 97% 95% 97%  Weight:      Height:        Intake/Output Summary (Last 24 hours) at 03/29/2019 1725 Last data filed at 03/29/2019 1446 Gross per 24 hour  Intake 3741.99 ml  Output 1750 ml  Net 1991.99 ml   Filed Weights   03/28/19 1344  Weight: 63 kg    Examination: General appearance: Thin adult male, alert and in no acute distress.   HEENT: Anicteric, conjunctiva pink, lids and lashes normal. No nasal deformity, discharge, epistaxis.  Lips, oropharynx dry, no oral lesions.   Skin: Warm and dry. No jaundice.  No suspicious rashes or lesions. Cardiac: Tachycardic, nl S1-S2, no murmurs appreciated.  Capillary refill is brisk.  JVP not visible.  No LE edema.  Radial pulses 2+ and symmetric. Respiratory: Normal respiratory rate and rhythm.  CTAB without rales or wheezes. Abdomen: Abdomen soft. Abdomen is distended, but there is no rigidity, rebound, tenderness to palpation, or hepatosplenomegaly.  MSK: No deformities or effusions. Diffuse loss of subcutaneous muscle mass and fat. Neuro: Awake and alert.  EOMI, moves all extremities. Speech fluent.    Psych: Sensorium intact and responding to questions, attention normal. Affect blunted.  Judgment and insight appear normal.    Data Reviewed: I have personally reviewed following labs and imaging studies:  CBC: Recent Labs  Lab 03/28/19 1358 03/29/19 0212 03/29/19 1507  WBC 27.6* 26.3*  --   HGB 8.4* 6.8* 8.9*  HCT 28.1* 22.4* 27.4*  MCV 87.0 86.2  --   PLT 177 125*  --    Basic Metabolic Panel: Recent Labs  Lab 03/28/19 1358 03/29/19 0038 03/29/19 0212 03/29/19 0547 03/29/19 1507  NA 139 139 141 141 141  K 3.4*  3.2* 3.4* 3.6 3.5  CL 104 106 104 104 99  CO2 12* 14* 20* 18* 24  GLUCOSE 99 124* 138* 182* 148*  BUN 27* 29* 30* 31* 38*  CREATININE 3.96* 3.66* 3.64* 3.27* 3.35*  CALCIUM 8.7* 7.6* 7.3* 7.1* 7.3*  MG  --  1.3*  --   --  2.2   GFR: Estimated Creatinine Clearance: 18.3 mL/min (A) (by C-G formula based on SCr of 3.35 mg/dL (H)). Liver Function Tests: No results for input(s): AST, ALT, ALKPHOS, BILITOT, PROT, ALBUMIN in the last 168 hours. No results for input(s): LIPASE, AMYLASE in the last 168 hours. No results for input(s): AMMONIA in the last 168 hours. Coagulation Profile: No results for input(s): INR, PROTIME in the last 168 hours. Cardiac Enzymes: No results for input(s): CKTOTAL, CKMB, CKMBINDEX, TROPONINI in the last 168 hours. BNP (last 3 results) No results for input(s): PROBNP in the last 8760 hours. HbA1C: No  results for input(s): HGBA1C in the last 72 hours. CBG: Recent Labs  Lab 03/29/19 0014  GLUCAP 89   Lipid Profile: No results for input(s): CHOL, HDL, LDLCALC, TRIG, CHOLHDL, LDLDIRECT in the last 72 hours. Thyroid Function Tests: No results for input(s): TSH, T4TOTAL, FREET4, T3FREE, THYROIDAB in the last 72 hours. Anemia Panel: Recent Labs    03/28/19 1639 03/28/19 1650 03/29/19 0547  VITAMINB12 309  --   --   FERRITIN  --  14*  --   TIBC  --  106*  --   IRON  --  6*  --   RETICCTPCT  --   --  1.8   Urine analysis:    Component Value Date/Time   COLORURINE YELLOW (A) 03/28/2019 1358   APPEARANCEUR TURBID (A) 03/28/2019 1358   APPEARANCEUR Hazy (A) 10/29/2018 0909   LABSPEC 1.013 03/28/2019 1358   PHURINE 5.0 03/28/2019 1358   GLUCOSEU NEGATIVE 03/28/2019 1358   HGBUR SMALL (A) 03/28/2019 1358   BILIRUBINUR NEGATIVE 03/28/2019 1358   BILIRUBINUR Negative 10/29/2018 0909   KETONESUR NEGATIVE 03/28/2019 1358   PROTEINUR 30 (A) 03/28/2019 1358   NITRITE POSITIVE (A) 03/28/2019 1358   LEUKOCYTESUR MODERATE (A) 03/28/2019 1358   Sepsis  Labs: @LABRCNTIP (procalcitonin:4,lacticacidven:4)  ) Recent Results (from the past 240 hour(s))  Urine C&S     Status: Abnormal (Preliminary result)   Collection Time: 03/28/19  1:58 PM   Specimen: Urine, Random  Result Value Ref Range Status   Specimen Description   Final    URINE, RANDOM Performed at [facility], [address] [place], [place] [address]    Special Requests   Final    NONE Performed at [facility], [address], [place], [place] [address]    Culture (A)  Final    >=100,000 COLONIES/mL GRAM NEGATIVE RODS IDENTIFICATION AND SUSCEPTIBILITIES TO FOLLOW Performed at [facility], [place] [address] [place], [place] [address]    Report Status PENDING  Incomplete  SARS CORONAVIRUS 2 (TAT 6-24 HRS) Nasopharyngeal Nasopharyngeal Swab     Status: None   Collection Time: 03/28/19  3:46 PM   Specimen: Nasopharyngeal Swab  Result Value Ref Range Status   SARS Coronavirus 2 NEGATIVE NEGATIVE Final    Comment: (NOTE) SARS-CoV-2 target nucleic acids are NOT DETECTED. The SARS-CoV-2 RNA is generally detectable in upper and lower respiratory specimens during the acute phase of infection. Negative results do not preclude SARS-CoV-2 infection, do not rule out co-infections with other pathogens, and should not be used as the sole basis for treatment or other patient management decisions. Negative results must be combined with clinical observations, patient history, and epidemiological information. The expected result is Negative. Fact Sheet for Patients: SugarRoll.be Fact Sheet for Healthcare Providers: https://www.woods-mathews.com/ This test is not yet approved or cleared by the [place] FDA and  has been authorized for detection and/or diagnosis of SARS-CoV-2 by FDA under an Emergency Use Authorization (EUA). This EUA will remain  in effect (meaning this test can be used) for the duration of  the COVID-19 declaration under Section 56 4(b)(1) of the Act, 21 U.S.C. section 360bbb-3(b)(1), unless the authorization is terminated or revoked sooner. Performed at [facility], [place] [address] [place], [place] [address]   CULTURE, BLOOD (ROUTINE X 2) w Reflex to ID Panel     Status: None (Preliminary result)   Collection Time: 03/28/19  4:38 PM   Specimen: BLOOD  Result Value Ref Range Status  Specimen Description BLOOD BLOOD LEFT HAND  Final   Special Requests   Final    BOTTLES DRAWN AEROBIC AND ANAEROBIC Blood Culture adequate volume   Culture   Final    NO GROWTH < 24 HOURS Performed at [facility], [address] [place], [place] [address]    Report Status PENDING  Incomplete  CULTURE, BLOOD (ROUTINE X 2) w Reflex to ID Panel     Status: None (Preliminary result)   Collection Time: 03/28/19  4:54 PM   Specimen: BLOOD  Result Value Ref Range Status   Specimen Description BLOOD BLOOD RIGHT FOREARM  Final   Special Requests   Final    BOTTLES DRAWN AEROBIC AND ANAEROBIC Blood Culture adequate volume   Culture   Final    NO GROWTH < 24 HOURS Performed at [facility], [address], [place], [place] [address]    Report Status PENDING  Incomplete         Radiology Studies: DG Abd 1 View  Result Date: 03/28/2019 CLINICAL DATA:  Difficulty urinating EXAM: ABDOMEN - 1 VIEW COMPARISON:  10/07/2018 CT of the abdomen FINDINGS: Scattered large and small bowel gas is noted. Left lower pole staghorn calculus is again noted and stable. No definitive renal or ureteral stones are seen. Previously noted bladder calculus has been removed in the interval. No bony abnormality is noted. 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 No acute abnormality noted. Electronically Signed   By: Alcide Clever M.D.   On: 03/28/2019 16:50   US Renal  Result Date: 03/28/2019 CLINICAL DATA:  Acute kidney injury EXAM: RENAL / URINARY TRACT  ULTRASOUND COMPLETE COMPARISON:  CT abdomen 10/07/2018 FINDINGS: Right Kidney: Renal measurements: 10.6 by 5.7 by 5.7 cm = volume: 178.6 mL. Tiny echogenic foci without definite shadowing, no well-defined stones are identified. No renal mass noted. No hydronephrosis. Left Kidney: Renal measurements: 11.6 by 5.8 by 5.7 cm = volume: 200.6 mL. Large stone in the left kidney lower pole corresponding to previous findings at CT, approximately 1.6 cm in long axis. Normal renal echogenicity. Exophytic 1.4 cm cyst from the left kidney lower pole. Bladder: The urinary bladder is collapsed around the Foley catheter. Other: None. IMPRESSION: 1. 1.6 cm left kidney lower pole staghorn calculus as shown on prior CT. The smaller upper pole calculi previously present bilaterally on CT are not readily visible on today's ultrasound. 2. The urinary bladder appears collapsed around the Foley catheter. The very large bladder calculus shown on 10/07/2018 is not identified today. 3. No hydronephrosis.  Normal renal echogenicity. Electronically Signed   By: Gaylyn Rong M.D.   On: 03/28/2019 16:39        Scheduled Meds:  sodium chloride   Intravenous Once   Chlorhexidine Gluconate Cloth  6 each Topical Daily   enoxaparin (LOVENOX) injection  30 mg Subcutaneous Q24H   finasteride  5 mg Oral Daily   Continuous Infusions:  cefTRIAXone (ROCEPHIN)  IV Stopped (03/29/19 1404)   sodium bicarbonate 150 mEq in dextrose 5% 1000 mL 125 mL/hr at 03/29/19 1446     LOS: 1 day    Time spent: 35 minutes    Alberteen Sam, MD [facility] 03/29/2019, 5:25 PM     Please page though AMION or Epic secure chat:  For [employer], Higher education careers adviser

## 2019-03-29 NOTE — Telephone Encounter (Signed)
Done

## 2019-03-29 NOTE — Progress Notes (Signed)
Notified Dr. Maryfrances Bunnell of patient's red MEWS. 1 unit of blood already ordered and started. Patient asymptomatic with low BP. No new orders at this time.

## 2019-03-29 NOTE — ED Notes (Signed)
ED TO INPATIENT HANDOFF REPORT  ED Nurse Name and Phone #: Woody Seller Name/Age/Gender Marvin Ortega 69 y.o. male Room/Bed: ED38A/ED38A  Code Status   Code Status: Full Code  Home/SNF/Other Home Patient oriented to: self, place, time and situation Is this baseline? Yes   Triage Complete: Triage complete  Chief Complaint Acute renal failure (ARF) (HCC) [N17.9]  Triage Note Pt states he has been having a hard time passing urine for the past couple of days, states he has a hx of enlarged prostate.. states he has only been able to pass a couple drops at a time.     Allergies No Known Allergies  Level of Care/Admitting Diagnosis ED Disposition    ED Disposition Condition Comment   Admit  Hospital Area: [facility] [[id number]]  Level of Care: Med-Surg [16]  Covid Evaluation: Asymptomatic Screening Protocol (No Symptoms)  Diagnosis: Acute renal failure (ARF) [facility]) [[id number]]  Admitting Physician: Alberteen Sam [[id number]]  Attending Physician: Alberteen Sam [[id number]]  Estimated length of stay: past midnight tomorrow  Certification:: I certify this patient will need inpatient services for at least 2 midnights       B Medical/Surgery History Past Medical History:  Diagnosis Date  . Atrial fibrillation (HCC)   . BPH (benign prostatic hyperplasia)   . COPD (chronic obstructive pulmonary disease) (HCC)   . Dieulafoy lesion of stomach   . GERD (gastroesophageal reflux disease)   . GIB (gastrointestinal bleeding) 02/15/2018  . Hemorrhagic shock (HCC)   . History of kidney stones   . Hypertension   . Pre-diabetes   . Upper GI bleed    Past Surgical History:  Procedure Laterality Date  . CYSTOSCOPY W/ RETROGRADES Right 11/23/2018   Procedure: CYSTOSCOPY WITH RETROGRADE PYELOGRAM;  Surgeon: Riki Altes, MD;  Location: [facility] ORS;  Service: Urology;  Laterality: Right;  . CYSTOSCOPY WITH HOLMIUM LASER LITHOTRIPSY  11/30/2018    Procedure: CYSTOSCOPY WITH HOLMIUM LASER LITHOTRIPSY;  Surgeon: Riki Altes, MD;  Location: [facility] OR[facility];  Service: Urology;;  . Bluford Kaufmann WITH LITHOLAPAXY N/A 11/23/2018   Procedure: CYSTOSCOPY WITH LITHOLAPAXY;  Surgeon: Riki Altes, MD;  Location: [facility] OR[facility];  Service: Urology;  Laterality: N/A;  . CYSTOSCOPY WITH LITHOLAPAXY N/A 11/30/2018   Procedure: CYSTOSCOPY WITH LITHOLAPAXY;  Surgeon: Riki Altes, MD;  Location: [facility] OR[facility];  Service: Urology;  Laterality: N/A;  . CYSTOSCOPY/URETEROSCOPY/HOLMIUM LASER/STENT PLACEMENT Right 11/23/2018   Procedure: CYSTOSCOPY/URETEROSCOPY/HOLMIUM LASER/STENT PLACEMENT;  Surgeon: Riki Altes, MD;  Location: [facility];  Service: Urology;  Laterality: Right;  . ESOPHAGOGASTRODUODENOSCOPY N/A 02/15/2018   Procedure: ESOPHAGOGASTRODUODENOSCOPY (EGD);  Surgeon: Toney Reil, MD;  Location: [facility];  Service: Gastroenterology;  Laterality: N/A;     A IV Location/Drains/Wounds Patient Lines/Drains/Airways Status   Active Line/Drains/Airways    Name:   Placement date:   Placement time:   Site:   Days:   Peripheral IV 03/28/19 Left Forearm   03/28/19    1830    Forearm   1   Peripheral IV 03/28/19 Left Wrist   03/28/19    2258    Wrist   1   Urethral Catheter stoioff Double-lumen;Latex;Straight-tip 18 Fr.   11/30/18    1648    Double-lumen;Latex;Straight-tip   119   Incision (Closed) 11/23/18 Penis Other (Comment)   11/23/18    1102     126          Intake/Output Last 24 hours  Intake/Output Summary (Last 24 hours) at  03/29/2019 0020 Last data filed at 03/28/2019 1813 Gross per 24 hour  Intake 1600 ml  Output 1000 ml  Net 600 ml    Labs/Imaging Results for orders placed or performed during the hospital encounter of 03/28/19 (from the past 48 hour(s))  Urinalysis, Complete w Microscopic     Status: Abnormal   Collection Time: 03/28/19  1:58 PM  Result Value Ref Range   Color, Urine YELLOW (A) YELLOW   APPearance TURBID (A)  CLEAR   Specific Gravity, Urine 1.013 1.005 - 1.030   pH 5.0 5.0 - 8.0   Glucose, UA NEGATIVE NEGATIVE mg/dL   Hgb urine dipstick SMALL (A) NEGATIVE   Bilirubin Urine NEGATIVE NEGATIVE   Ketones, ur NEGATIVE NEGATIVE mg/dL   Protein, ur 30 (A) NEGATIVE mg/dL   Nitrite POSITIVE (A) NEGATIVE   Leukocytes,Ua MODERATE (A) NEGATIVE   RBC / HPF >50 (H) 0 - 5 RBC/hpf   WBC, UA >50 (H) 0 - 5 WBC/hpf   Bacteria, UA MANY (A) NONE SEEN   Squamous Epithelial / LPF NONE SEEN 0 - 5   WBC Clumps PRESENT    Mucus PRESENT     Comment: Performed at [facility], [address] [place], [place] [address]  Basic metabolic panel     Status: Abnormal   Collection Time: 03/28/19  1:58 PM  Result Value Ref Range   Sodium 139 135 - 145 mmol/L    Comment:  ELECTROLYTES REPEATED SDR   Potassium 3.4 (L) 3.5 - 5.1 mmol/L   Chloride 104 98 - 111 mmol/L   CO2 12 (L) 22 - 32 mmol/L   Glucose, Bld 99 70 - 99 mg/dL   BUN 27 (H) 8 - 23 mg/dL   Creatinine, Ser 6.75 (H) 0.61 - 1.24 mg/dL   Calcium 8.7 (L) 8.9 - 10.3 mg/dL   GFR calc non Af Amer 14 (L) >60 mL/min   GFR calc Af Amer 17 (L) >60 mL/min   Anion gap 23 (H) 5 - 15    Comment: Performed at [facility], [address] [place], [place] [address]  CBC     Status: Abnormal   Collection Time: 03/28/19  1:58 PM  Result Value Ref Range   WBC 27.6 (H) 4.0 - 10.5 K/uL   RBC 3.23 (L) 4.22 - 5.81 MIL/uL   Hemoglobin 8.4 (L) 13.0 - 17.0 g/dL   HCT 46.6 (L) 59.9 - 35.7 %   MCV 87.0 80.0 - 100.0 fL   MCH 26.0 26.0 - 34.0 pg   MCHC 29.9 (L) 30.0 - 36.0 g/dL   RDW 01.7 (H) 79.3 - 90.3 %   Platelets 177 150 - 400 K/uL   nRBC 0.1 0.0 - 0.2 %    Comment: Performed at [facility], [address] [place], [place] [address]  Lactic acid, plasma     Status: Abnormal   Collection Time: 03/28/19  3:46 PM  Result Value Ref Range   Lactic Acid, Venous >11.0 (HH) 0.5 - 1.9 mmol/L    Comment: CRITICAL RESULT CALLED TO, READ BACK BY AND  VERIFIED WITH ASHLEY SMITH @1625  03/28/19 MJU Performed at [facility], [address] [place], [place]   SARS CORONAVIRUS 2 (TAT 6-24 HRS) Nasopharyngeal Nasopharyngeal Swab     Status: None   Collection Time: 03/28/19  3:46 PM   Specimen: Nasopharyngeal Swab  Result Value Ref Range   SARS Coronavirus 2 NEGATIVE NEGATIVE    Comment: (NOTE) SARS-CoV-2 target  nucleic acids are NOT DETECTED. The SARS-CoV-2 RNA is generally detectable in upper and lower respiratory specimens during the acute phase of infection. Negative results do not preclude SARS-CoV-2 infection, do not rule out co-infections with other pathogens, and should not be used as the sole basis for treatment or other patient management decisions. Negative results must be combined with clinical observations, patient history, and epidemiological information. The expected result is Negative. Fact Sheet for Patients: HairSlick.no Fact Sheet for Healthcare Providers: quierodirigir.com This test is not yet approved or cleared by the [place] FDA and  has been authorized for detection and/or diagnosis of SARS-CoV-2 by FDA under an Emergency Use Authorization (EUA). This EUA will remain  in effect (meaning this test can be used) for the duration of the COVID-19 declaration under Section 56 4(b)(1) of the Act, 21 U.S.C. section 360bbb-3(b)(1), unless the authorization is terminated or revoked sooner. Performed at [facility], [address] [place], [place] [address]   Vitamin B12     Status: None   Collection Time: 03/28/19  4:39 PM  Result Value Ref Range   Vitamin B-12 309 180 - 914 pg/mL    Comment: (NOTE) This assay is not validated for testing neonatal or myeloproliferative syndrome specimens for Vitamin B12 levels. Performed at [facility], [address], [place], [place] [address]   Iron and TIBC     Status: Abnormal    Collection Time: 03/28/19  4:50 PM  Result Value Ref Range   Iron 6 (L) 45 - 182 ug/dL   TIBC 867 (L) 619 - 509 ug/dL   Saturation Ratios 6 (L) 17.9 - 39.5 %   UIBC 100 ug/dL    Comment: Performed at [facility], [address] [place], [place] [address]  Ferritin     Status: Abnormal   Collection Time: 03/28/19  4:50 PM  Result Value Ref Range   Ferritin 14 (L) 24 - 336 ng/mL    Comment: Performed at [facility], [address] [place], [place] [address]  Lactic acid, plasma     Status: Abnormal   Collection Time: 03/28/19  7:30 PM  Result Value Ref Range   Lactic Acid, Venous 9.2 (HH) 0.5 - 1.9 mmol/L    Comment: CRITICAL VALUE NOTED. VALUE IS CONSISTENT WITH PREVIOUSLY REPORTED/CALLED VALUE/TFK Performed at [facility], [address] [place], [place] [address]   Lactic acid, plasma     Status: Abnormal   Collection Time: 03/28/19 11:00 PM  Result Value Ref Range   Lactic Acid, Venous 8.6 (HH) 0.5 - 1.9 mmol/L    Comment: CRITICAL VALUE NOTED. VALUE IS CONSISTENT WITH PREVIOUSLY REPORTED/CALLED VALUE RWW Performed at [facility], [address] [place], [place] [address]   Glucose, capillary     Status: None   Collection Time: 03/29/19 12:14 AM  Result Value Ref Range   Glucose-Capillary 89 70 - 99 mg/dL   DG Abd 1 View  Result Date: 03/28/2019 CLINICAL DATA:  Difficulty urinating EXAM: ABDOMEN - 1 VIEW COMPARISON:  10/07/2018 CT of the abdomen FINDINGS: Scattered large and small bowel gas is noted. Left lower pole staghorn calculus is again noted and stable. No definitive renal or ureteral stones are seen. Previously noted bladder calculus has been removed in the interval. No bony abnormality is noted. 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 No acute abnormality noted. Electronically Signed   By: Eulah Pont.D.  On: 03/28/2019 16:50   US Renal  Result Date: 03/28/2019 CLINICAL DATA:  Acute  kidney injury EXAM: RENAL / URINARY TRACT ULTRASOUND COMPLETE COMPARISON:  CT abdomen 10/07/2018 FINDINGS: Right Kidney: Renal measurements: 10.6 by 5.7 by 5.7 cm = volume: 178.6 mL. Tiny echogenic foci without definite shadowing, no well-defined stones are identified. No renal mass noted. No hydronephrosis. Left Kidney: Renal measurements: 11.6 by 5.8 by 5.7 cm = volume: 200.6 mL. Large stone in the left kidney lower pole corresponding to previous findings at CT, approximately 1.6 cm in long axis. Normal renal echogenicity. Exophytic 1.4 cm cyst from the left kidney lower pole. Bladder: The urinary bladder is collapsed around the Foley catheter. Other: None. IMPRESSION: 1. 1.6 cm left kidney lower pole staghorn calculus as shown on prior CT. The smaller upper pole calculi previously present bilaterally on CT are not readily visible on today's ultrasound. 2. The urinary bladder appears collapsed around the Foley catheter. The very large bladder calculus shown on 10/07/2018 is not identified today. 3. No hydronephrosis.  Normal renal echogenicity. Electronically Signed   By: Van Clines M.D.   On: 03/28/2019 16:39    Pending Labs Unresulted Labs (From admission, onward)    Start     Ordered   04/04/19 0500  Creatinine, serum  (enoxaparin (LOVENOX)    CrCl < 30 ml/min)  Weekly,   STAT    Comments: while on enoxaparin therapy.    03/28/19 1614   03/29/19 0500  HIV Antibody (routine testing w rflx)  (HIV Antibody (Routine testing w reflex) panel)  Tomorrow morning,   STAT     03/28/19 1614   03/29/19 0500  Pathologist smear review  Tomorrow morning,   STAT     03/28/19 1614   03/29/19 2426  Basic metabolic panel  Tomorrow morning,   STAT     03/28/19 1614   03/29/19 0500  CBC  Tomorrow morning,   STAT     03/28/19 1614   03/28/19 2357  Magnesium  Once,   STAT     03/28/19 2356   03/28/19 8341  Basic metabolic panel  Once-Timed,   STAT     03/28/19 2132   03/28/19 2230  Lactic acid, plasma   STAT Now then every 3 hours,   STAT     03/28/19 2229   03/28/19 1830  Reticulocytes  Once,   STAT     03/28/19 1830   03/28/19 1616  CULTURE, BLOOD (ROUTINE X 2) w Reflex to ID Panel  BLOOD CULTURE X 2,   STAT     03/28/19 1615   03/28/19 1346  Urine C&S  Once,   STAT     03/28/19 1346          Vitals/Pain Today's Vitals   03/28/19 2216 03/28/19 2230 03/28/19 2330 03/29/19 0000  BP: 101/64 101/66 95/64 94/63   Pulse: (!) 115 (!) 114 (!) 110 (!) 111  Resp: (!) 25 19 (!) 24 (!) 27  Temp:      TempSrc:      SpO2: 100% 100% 100% 100%  Weight:      Height:      PainSc:        Isolation Precautions No active isolations  Medications Medications  finasteride (PROSCAR) tablet 5 mg (5 mg Oral Given 03/28/19 2015)  acetaminophen (TYLENOL) tablet 650 mg (has no administration in time range)    Or  acetaminophen (TYLENOL) suppository 650 mg (has no administration in time range)  ondansetron (  ZOFRAN) tablet 4 mg (has no administration in time range)    Or  ondansetron (ZOFRAN) injection 4 mg (has no administration in time range)  cefTRIAXone (ROCEPHIN) 1 g in sodium chloride 0.9 % 100 mL IVPB (has no administration in time range)  ipratropium-albuterol (DUONEB) 0.5-2.5 (3) MG/3ML nebulizer solution 3 mL (3 mLs Nebulization Given 03/28/19 2016)  enoxaparin (LOVENOX) injection 30 mg (30 mg Subcutaneous Given 03/28/19 2301)  sodium bicarbonate 150 mEq in dextrose 5% 1000 mL infusion (has no administration in time range)  sodium chloride 0.9 % bolus 500 mL (0 mLs Intravenous Stopped 03/28/19 1700)  cefTRIAXone (ROCEPHIN) 2 g in sodium chloride 0.9 % 100 mL IVPB (0 g Intravenous Stopped 03/28/19 1630)  sodium chloride 0.9 % bolus 1,000 mL (0 mLs Intravenous Stopped 03/28/19 1813)  lactated ringers bolus 1,000 mL (0 mLs Intravenous Stopped 03/28/19 2056)    Mobility walks Low fall risk   Focused Assessments Urinary   R Recommendations: See Admitting Provider Note  Report given to:    Additional Notes:

## 2019-03-29 NOTE — ED Notes (Signed)
Unable to obtain laboratory specimen. Phlebotomist called to obtain blood.

## 2019-03-29 NOTE — Progress Notes (Signed)
Received order from Dr. Loleta Books to have H & H, Met B and lactic acid drawn 30 minutes after the patient's blood transfusion has ended. Writer will place orders at conclusion of blood.

## 2019-03-29 NOTE — Telephone Encounter (Signed)
-----   Message from Sondra Come, MD sent at 03/28/2019  4:27 PM EST ----- Regarding: follow up visit Patient of Dr. Heywood Footman with recent bladder stone removal, and now recurrent urinary retention.  Please arrange follow-up with Dr. Lonna Cobb in 1 to 2 weeks to re-discuss outlet procedures for his urinary retention, thanks  Legrand Rams, MD 03/28/2019

## 2019-03-29 NOTE — ED Notes (Signed)
Phlebotomist at bedside.

## 2019-03-29 NOTE — Telephone Encounter (Signed)
This patient was supposed to have followed up with you with a RUS prior on 04-06-19 but scheduling was never able to reach him  He is now in the hospital and due to see you for follow up on 04-14-19. Does he still need the RUS?  Marcelino Duster

## 2019-03-29 NOTE — Progress Notes (Signed)
CRITICAL VALUE ALERT  Critical Value:  Lactic acid 6.6  Date & Time Notied:  03/29/2019 at 0915  Provider Notified: Dr. Maryfrances Bunnell  Orders Received/Actions taken: Acknowledged

## 2019-03-29 NOTE — Progress Notes (Signed)
Order received from Dr. Maryfrances Bunnell to discontinue the normal saline bolus and order lactic acid

## 2019-03-30 ENCOUNTER — Inpatient Hospital Stay: Payer: Medicare HMO

## 2019-03-30 ENCOUNTER — Encounter: Payer: Self-pay | Admitting: Family Medicine

## 2019-03-30 ENCOUNTER — Inpatient Hospital Stay
Admit: 2019-03-30 | Discharge: 2019-03-30 | Disposition: A | Discharge status: Home / Self Care | Payer: Medicare HMO | Attending: Acute Care | Admitting: Acute Care

## 2019-03-30 DIAGNOSIS — N179 Acute kidney failure, unspecified: Secondary | ICD-10-CM

## 2019-03-30 DIAGNOSIS — I10 Essential (primary) hypertension: Secondary | ICD-10-CM

## 2019-03-30 LAB — BASIC METABOLIC PANEL
Anion gap: 15 (ref 5–15)
Anion gap: 13 (ref 5–15)
BUN: 40 mg/dL — ABNORMAL HIGH (ref 8–23)
BUN: 40 mg/dL — ABNORMAL HIGH (ref 8–23)
CO2: 27 mmol/L (ref 22–32)
CO2: 27 mmol/L (ref 22–32)
Calcium: 7.1 mg/dL — ABNORMAL LOW (ref 8.9–10.3)
Calcium: 7.8 mg/dL — ABNORMAL LOW (ref 8.9–10.3)
Chloride: 97 mmol/L — ABNORMAL LOW (ref 98–111)
Chloride: 100 mmol/L (ref 98–111)
Creatinine, Ser: 2.53 mg/dL — ABNORMAL HIGH (ref 0.61–1.24)
Creatinine, Ser: 2.19 mg/dL — ABNORMAL HIGH (ref 0.61–1.24)
GFR calc Af Amer: 29 mL/min — ABNORMAL LOW (ref >60)
GFR calc Af Amer: 34 mL/min — ABNORMAL LOW (ref >60)
GFR calc non Af Amer: 25 mL/min — ABNORMAL LOW (ref >60)
GFR calc non Af Amer: 29 mL/min — ABNORMAL LOW (ref >60)
Glucose, Bld: 149 mg/dL — ABNORMAL HIGH (ref 70–99)
Glucose, Bld: 198 mg/dL — ABNORMAL HIGH (ref 70–99)
Potassium: 3 mmol/L — ABNORMAL LOW (ref 3.5–5.1)
Potassium: 4.2 mmol/L (ref 3.5–5.1)
Sodium: 139 mmol/L (ref 135–145)
Sodium: 140 mmol/L (ref 135–145)

## 2019-03-30 LAB — CBC
HCT: 24.3 % — ABNORMAL LOW (ref 39.0–52.0)
Hemoglobin: 8.1 g/dL — ABNORMAL LOW (ref 13.0–17.0)
MCH: 26.5 pg (ref 26.0–34.0)
MCHC: 33.3 g/dL (ref 30.0–36.0)
MCV: 79.4 fL — ABNORMAL LOW (ref 80.0–100.0)
Platelets: 86 10*3/uL — ABNORMAL LOW (ref 150–400)
RBC: 3.06 MIL/uL — ABNORMAL LOW (ref 4.22–5.81)
RDW: 18.3 % — ABNORMAL HIGH (ref 11.5–15.5)
WBC: 19.1 10*3/uL — ABNORMAL HIGH (ref 4.0–10.5)
nRBC: 0 % (ref 0.0–0.2)

## 2019-03-30 LAB — URINE CULTURE: Culture: >=100000 — AB

## 2019-03-30 LAB — BPAM RBC
Blood Product Expiration Date: 202102062359
ISSUE DATE / TIME: 202101191102
Unit Type and Rh: 1700

## 2019-03-30 LAB — TYPE AND SCREEN
ABO/RH(D): B POS
Antibody Screen: NEGATIVE
Unit division: 0

## 2019-03-30 LAB — LACTIC ACID, PLASMA
Lactic Acid, Venous: 4.4 mmol/L (ref 0.5–1.9)
Lactic Acid, Venous: 4.3 mmol/L (ref 0.5–1.9)

## 2019-03-30 LAB — MAGNESIUM: Magnesium: 2.1 mg/dL (ref 1.7–2.4)

## 2019-03-30 LAB — BRAIN NATRIURETIC PEPTIDE: B Natriuretic Peptide: 283 pg/mL — ABNORMAL HIGH (ref 0.0–100.0)

## 2019-03-30 MED ORDER — LACTATED RINGERS IV SOLN: INTRAVENOUS | Status: DC

## 2019-03-30 MED ORDER — METOPROLOL TARTRATE 25 MG PO TABS
12.5000 mg | ORAL_TABLET | Freq: Two times a day (BID) | ORAL | Status: DC
  Administered 2019-03-30 – 2019-03-31 (×4): 12.5 mg via ORAL
  Filled 2019-03-30 (×4): qty 1

## 2019-03-30 MED ORDER — METHYLPREDNISOLONE SODIUM SUCC 125 MG IJ SOLR
80.0000 mg | Freq: Three times a day (TID) | INTRAMUSCULAR | Status: DC
  Administered 2019-03-30 – 2019-04-01 (×6): 80 mg via INTRAVENOUS
  Filled 2019-03-30 (×6): qty 2

## 2019-03-30 MED ORDER — SODIUM CHLORIDE 0.9 % IV SOLN: INTRAVENOUS | Status: DC

## 2019-03-30 MED ORDER — HYDROCODONE-HOMATROPINE 5-1.5 MG/5ML PO SYRP: 5.0000 mL | ORAL_SOLUTION | Freq: Four times a day (QID) | ORAL | Status: DC | PRN

## 2019-03-30 MED ORDER — METOPROLOL TARTRATE 5 MG/5ML IV SOLN
5.0000 mg | Freq: Once | INTRAVENOUS | Status: AC
  Administered 2019-03-30: 5 mg via INTRAVENOUS
  Filled 2019-03-30: qty 5

## 2019-03-30 MED ORDER — IPRATROPIUM-ALBUTEROL 0.5-2.5 (3) MG/3ML IN SOLN
3.0000 mL | Freq: Four times a day (QID) | RESPIRATORY_TRACT | Status: DC
  Administered 2019-03-30 – 2019-04-01 (×9): 3 mL via RESPIRATORY_TRACT
  Filled 2019-03-30 (×10): qty 3

## 2019-03-30 MED ORDER — SODIUM CHLORIDE 0.9 % IV BOLUS
500.0000 mL | Freq: Once | INTRAVENOUS | Status: AC
  Administered 2019-03-30: 500 mL via INTRAVENOUS

## 2019-03-30 MED ORDER — POTASSIUM CHLORIDE CRYS ER 20 MEQ PO TBCR
40.0000 meq | EXTENDED_RELEASE_TABLET | Freq: Two times a day (BID) | ORAL | Status: DC
  Administered 2019-03-30 – 2019-04-02 (×7): 40 meq via ORAL
  Filled 2019-03-30 (×7): qty 2

## 2019-03-30 NOTE — Progress Notes (Signed)
Patient in with metabolic acidosis and UTI from obstructive uropathy due to BPH. Had episode of reported elevated heart rate on arrival to floor on admission but was not on tele.  Tele initiated and remained sinus tach. Last night had episode of paroxysmal SVT. That spontaneously resolved rate 70's. Systolic BP after event 90's.   Discussed with patient past episodes.  He denies being diagnosed with any arrhythmia.  He eluded to his dad having "something" wrong with his heart necessitating a pacemaker placement. The patietn himself reveals diagnosed with murmur as teenage excluding him from being able to join [employer] but he has never had cardiology follow up.   Symptomatic wise he does now admit he has felt palpitations in the past and does occasionally have dizziness with standing. The symptoms was difficult to determine if was just recent with his illness or has been a chronic occurrence. He dinies difficulty lying flat  Around 5 this morning again had SVT event but more sustained for longer period.  He did return to sinus tachycardia at rate of 118 after 5 mg IV metoprolol and 500 cc fluid bolus Potassium this am 3.0, Mg 2.1,  = K supplemented Echo ordered Cardiology consulted Started on low dose beta blocker   Bicarb on chem panel 27, bicarb fluids stopped and N started Lactic acid and creatinine improving Urine output still marginal but slight improvement

## 2019-03-30 NOTE — Progress Notes (Signed)
Tele called reporting pt had a run of SVT with HR in the 170s. NP Steward Drone notified, who came and assessed patient.

## 2019-03-30 NOTE — Progress Notes (Signed)
Progress Note    Marvin Ortega  KZS:[id number] DOB: [birth year]  DOA: 03/28/2019 PCP: Derinda Late, MD      Brief Narrative:    Medical records reviewed and are as summarized below:  Marvin Ortega is an 69 y.o. male with medical hx of BPH, kidney stones status post staged lithotripsy last fall c/b C. difficile, HTN, COPD, and remote history of alcohol use disorder now in remission, UGIB, and paroxysmal A. fib not on anticoagulantswho presentedwith urinary retention, malaise, generalized weakness and confusion.  In the ER, afebrile, HR 106, blood pressure 90/60, WBC 27.6K, hemoglobin 8.4, normocytic, and creatinine 3.96, up from baseline 1.1.Lactic acid >11. He was given ceftriaxone, IV fluids, and the hospitalist service were asked to evaluate for renal failure.    Assessment/Plan:   Principal Problem:   Acute renal failure (ARF) (HCC) Active Problems:   Essential (primary) hypertension   AA (alcohol abuse)   PAF (paroxysmal atrial fibrillation) (HCC)   COPD (chronic obstructive pulmonary disease) (HCC)   Acidosis   Normocytic anemia   Body mass index is 22.44 kg/m.    Sepsis secondary to UTI: Continue IV antibiotics  Severe leukocytosis: Improving.  Cough, wheezing and shortness likely due to COPD exacerbation: Chest x-ray was ordered today but no acute abnormality was noted.  Started IV steroids.  Change bronchodilators from as needed to scheduled.  Added Hycodan syrup.  Postobstructive uropathy secondary to BPH/acute renal failure with anion gap metabolic acidosis: Continue Foley catheter.  Creatinine is slowly improving.  Metabolic acidosis has resolved.  Resume IV fluids at a lower rate.  Outpatient follow-up with urologist in 2 weeks.  Severe iron deficiency anemia: Status post transfusion with 1 unit of packed red blood cells.  Outpatient follow-up for further work-up.  Status post SVT: Converted to sinus tachycardia.  Continue  metoprolol.  Questionable history of atrial fibrillation: Outpatient follow-up.   Family Communication/Anticipated D/C date and plan/Code Status   DVT prophylaxis: Lovenox Code Status: Full code Family Communication: Plan discussed with patient Disposition Plan: Possible discharge to home in 2 to 3 days      Subjective:   Overnight events noted.  Patient went into SVT and was given 5 mg of IV metoprolol and 500 cc normal saline bolus.  He complains of severe cough, wheezing and shortness of breath.  It was also noted that his heart rate goes up when he coughs.  Objective:    Vitals:   03/29/19 2313 03/30/19 0342 03/30/19 0510 03/30/19 0730  BP: 124/66 100/67 119/66 114/72  Pulse: (!) 124 (!) 118 (!) 132 (!) 115  Resp: 17   17  Temp: 98.1 F (36.7 C)   98.2 F (36.8 C)  TempSrc: Oral   Oral  SpO2: 96% 94%  95%  Weight:      Height:        Intake/Output Summary (Last 24 hours) at 03/30/2019 7322 Last data filed at 03/30/2019 0254 Gross per 24 hour  Intake 1343.7 ml  Output 1050 ml  Net 293.7 ml   Filed Weights   03/28/19 1344  Weight: 63 kg    Exam:  GEN: mild resp distress SKIN: No rash EYES: EOMI ENT: MMM CV: RRR PULM: decreased air entry, b/l wheezing, no rales heard ABD: soft, ND, NT, +BS CNS: AAO x 3, non focal EXT: No edema or tenderness GU: Foley cath with amber urine  Data Reviewed:   I have personally reviewed following labs and imaging studies:  Labs:  Labs show the following:   Basic Metabolic Panel: Recent Labs  Lab 03/29/19 0038 03/29/19 0038 03/29/19 0258 03/29/19 5277 03/29/19 0547 03/29/19 0547 03/29/19 1507 03/30/19 0345  NA 139  --  141  --  141  --  141 139  K 3.2*   < > 3.4*   < > 3.6   < > 3.5 3.0*  CL 106  --  104  --  104  --  99 97*  CO2 14*  --  20*  --  18*  --  24 27  GLUCOSE 124*  --  138*  --  182*  --  148* 149*  BUN 29*  --  30*  --  31*  --  38* 40*  CREATININE 3.66*  --  3.64*  --  3.27*  --  3.35* 2.53*   CALCIUM 7.6*  --  7.3*  --  7.1*  --  7.3* 7.1*  MG 1.3*  --   --   --   --   --  2.2 2.1   < > = values in this interval not displayed.   GFR Estimated Creatinine Clearance: 24.2 mL/min (A) (by C-G formula based on SCr of 2.53 mg/dL (H)). Liver Function Tests: No results for input(s): AST, ALT, ALKPHOS, BILITOT, PROT, ALBUMIN in the last 168 hours. No results for input(s): LIPASE, AMYLASE in the last 168 hours. No results for input(s): AMMONIA in the last 168 hours. Coagulation profile No results for input(s): INR, PROTIME in the last 168 hours.  CBC: Recent Labs  Lab 03/28/19 1358 03/29/19 0212 03/29/19 1507 03/30/19 0345  WBC 27.6* 26.3*  --  19.1*  HGB 8.4* 6.8* 8.9* 8.1*  HCT 28.1* 22.4* 27.4* 24.3*  MCV 87.0 86.2  --  79.4*  PLT 177 125*  --  86*   Cardiac Enzymes: No results for input(s): CKTOTAL, CKMB, CKMBINDEX, TROPONINI in the last 168 hours. BNP (last 3 results) No results for input(s): PROBNP in the last 8760 hours. CBG: Recent Labs  Lab 03/29/19 0014  GLUCAP 89   D-Dimer: No results for input(s): DDIMER in the last 72 hours. Hgb A1c: No results for input(s): HGBA1C in the last 72 hours. Lipid Profile: No results for input(s): CHOL, HDL, LDLCALC, TRIG, CHOLHDL, LDLDIRECT in the last 72 hours. Thyroid function studies: No results for input(s): TSH, T4TOTAL, T3FREE, THYROIDAB in the last 72 hours.  Invalid input(s): FREET3 Anemia work up: Recent Labs    03/28/19 1639 03/28/19 1650 03/29/19 0547  VITAMINB12 309  --   --   FERRITIN  --  14*  --   TIBC  --  106*  --   IRON  --  6*  --   RETICCTPCT  --   --  1.8   Sepsis Labs: Recent Labs  Lab 03/28/19 1358 03/28/19 1546 03/29/19 0212 03/29/19 0547 03/29/19 2038 03/29/19 2159 03/30/19 0345 03/30/19 0619  WBC 27.6*  --  26.3*  --   --   --  19.1*  --   LATICACIDVEN  --    < > 9.0*   < > 7.1* 5.9* 4.4* 4.3*   < > = values in this interval not displayed.    Microbiology Recent Results  (from the past 240 hour(s))  Urine C&S     Status: Abnormal   Collection Time: 03/28/19  1:58 PM   Specimen: Urine, Random  Result Value Ref Range Status   Specimen Description   Final    URINE, RANDOM  Performed at [facility], [address] [place], [place] [address]    Special Requests   Final    NONE Performed at [facility], [address] [place], [place] [address]    Culture >=100,000 COLONIES/mL SERRATIA MARCESCENS (A)  Final   Report Status 03/30/2019 FINAL  Final   Organism ID, Bacteria SERRATIA MARCESCENS (A)  Final      Susceptibility   Serratia marcescens - MIC*    CEFAZOLIN >=64 RESISTANT Resistant     CEFTRIAXONE <=0.25 SENSITIVE Sensitive     CIPROFLOXACIN <=0.25 SENSITIVE Sensitive     GENTAMICIN <=1 SENSITIVE Sensitive     NITROFURANTOIN 256 RESISTANT Resistant     TRIMETH/SULFA <=20 SENSITIVE Sensitive     * >=100,000 COLONIES/mL SERRATIA MARCESCENS  SARS CORONAVIRUS 2 (TAT 6-24 HRS) Nasopharyngeal Nasopharyngeal Swab     Status: None   Collection Time: 03/28/19  3:46 PM   Specimen: Nasopharyngeal Swab  Result Value Ref Range Status   SARS Coronavirus 2 NEGATIVE NEGATIVE Final    Comment: (NOTE) SARS-CoV-2 target nucleic acids are NOT DETECTED. The SARS-CoV-2 RNA is generally detectable in upper and lower respiratory specimens during the acute phase of infection. Negative results do not preclude SARS-CoV-2 infection, do not rule out co-infections with other pathogens, and should not be used as the sole basis for treatment or other patient management decisions. Negative results must be combined with clinical observations, patient history, and epidemiological information. The expected result is Negative. Fact Sheet for Patients: HairSlick.no Fact Sheet for Healthcare Providers: quierodirigir.com This test is not yet approved or cleared by the [place] FDA and  has been  authorized for detection and/or diagnosis of SARS-CoV-2 by FDA under an Emergency Use Authorization (EUA). This EUA will remain  in effect (meaning this test can be used) for the duration of the COVID-19 declaration under Section 56 4(b)(1) of the Act, 21 U.S.C. section 360bbb-3(b)(1), unless the authorization is terminated or revoked sooner. Performed at [facility], [address] [place], [place] [address]   CULTURE, BLOOD (ROUTINE X 2) w Reflex to ID Panel     Status: None (Preliminary result)   Collection Time: 03/28/19  4:38 PM   Specimen: BLOOD  Result Value Ref Range Status   Specimen Description BLOOD BLOOD LEFT HAND  Final   Special Requests   Final    BOTTLES DRAWN AEROBIC AND ANAEROBIC Blood Culture adequate volume   Culture   Final    NO GROWTH 2 DAYS Performed at [facility], [address], [place], [place] [address]    Report Status PENDING  Incomplete  CULTURE, BLOOD (ROUTINE X 2) w Reflex to ID Panel     Status: None (Preliminary result)   Collection Time: 03/28/19  4:54 PM   Specimen: BLOOD  Result Value Ref Range Status   Specimen Description BLOOD BLOOD RIGHT FOREARM  Final   Special Requests   Final    BOTTLES DRAWN AEROBIC AND ANAEROBIC Blood Culture adequate volume   Culture   Final    NO GROWTH 2 DAYS Performed at [facility], [address] [place], [place] [address]    Report Status PENDING  Incomplete    Procedures and diagnostic studies:  DG Abd 1 View  Result Date: 03/28/2019 CLINICAL DATA:  Difficulty urinating EXAM: ABDOMEN - 1 VIEW COMPARISON:  10/07/2018 CT of the abdomen FINDINGS: Scattered large and small bowel gas is noted. Left lower pole staghorn calculus is again noted  and stable. No definitive renal or ureteral stones are seen. Previously noted bladder calculus has been removed in the interval. No bony abnormality is noted. 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 No acute  abnormality noted. Electronically Signed   By: Alcide Clever M.D.   On: 03/28/2019 16:50   US Renal  Result Date: 03/28/2019 CLINICAL DATA:  Acute kidney injury EXAM: RENAL / URINARY TRACT ULTRASOUND COMPLETE COMPARISON:  CT abdomen 10/07/2018 FINDINGS: Right Kidney: Renal measurements: 10.6 by 5.7 by 5.7 cm = volume: 178.6 mL. Tiny echogenic foci without definite shadowing, no well-defined stones are identified. No renal mass noted. No hydronephrosis. Left Kidney: Renal measurements: 11.6 by 5.8 by 5.7 cm = volume: 200.6 mL. Large stone in the left kidney lower pole corresponding to previous findings at CT, approximately 1.6 cm in long axis. Normal renal echogenicity. Exophytic 1.4 cm cyst from the left kidney lower pole. Bladder: The urinary bladder is collapsed around the Foley catheter. Other: None. IMPRESSION: 1. 1.6 cm left kidney lower pole staghorn calculus as shown on prior CT. The smaller upper pole calculi previously present bilaterally on CT are not readily visible on today's ultrasound. 2. The urinary bladder appears collapsed around the Foley catheter. The very large bladder calculus shown on 10/07/2018 is not identified today. 3. No hydronephrosis.  Normal renal echogenicity. Electronically Signed   By: Gaylyn Rong M.D.   On: 03/28/2019 16:39    Medications:   . sodium chloride   Intravenous Once  . Chlorhexidine Gluconate Cloth  6 each Topical Daily  . enoxaparin (LOVENOX) injection  30 mg Subcutaneous Q24H  . finasteride  5 mg Oral Daily  . metoprolol tartrate  12.5 mg Oral BID  . potassium chloride  40 mEq Oral BID   Continuous Infusions: . sodium chloride    . cefTRIAXone (ROCEPHIN)  IV Stopped (03/29/19 1404)     LOS: 2 days   Kyrene Longan  [facility]   *Please refer to [email or web address], password [email or web address] to get updated schedule on who will round on this patient, as hospitalists switch teams weekly. If 7PM-7AM, please contact night-coverage at [email or web address],  password [email or web address] for any overnight needs.  03/30/2019, 9:23 AM

## 2019-03-30 NOTE — Progress Notes (Signed)
PT Cancellation Note  Patient Details Name: Marvin Ortega MRN: [id number] DOB: 1950/11/21   Cancelled Treatment:    Reason Eval/Treat Not Completed: Medical issues which prohibited therapy. Upon chart review, pt with resting HR of 137, resting RR of 33. Spoke with nursing who is recommending holding for therapy at this time secondary to vitals, increased SOB and coughing. PT will follow up for PT eval at later date/time when pt more medically appropriate for activity.   Karoline Caldwell PT, DPT 10:19 AM,03/30/19 [phone]    Farryn Linares Shyrl Numbers 03/30/2019, 10:18 AM

## 2019-03-30 NOTE — Progress Notes (Signed)
*  PRELIMINARY RESULTS* Echocardiogram 2D Echocardiogram has been performed.  Marvin Ortega M Krisna Omar 03/30/2019, 10:23 AM 

## 2019-03-31 DIAGNOSIS — J441 Chronic obstructive pulmonary disease with (acute) exacerbation: Secondary | ICD-10-CM

## 2019-03-31 DIAGNOSIS — N170 Acute kidney failure with tubular necrosis: Secondary | ICD-10-CM

## 2019-03-31 DIAGNOSIS — I480 Paroxysmal atrial fibrillation: Secondary | ICD-10-CM

## 2019-03-31 DIAGNOSIS — E872 Acidosis: Secondary | ICD-10-CM

## 2019-03-31 DIAGNOSIS — R339 Retention of urine, unspecified: Secondary | ICD-10-CM

## 2019-03-31 DIAGNOSIS — I959 Hypotension, unspecified: Secondary | ICD-10-CM

## 2019-03-31 DIAGNOSIS — A419 Sepsis, unspecified organism: Secondary | ICD-10-CM

## 2019-03-31 LAB — CBC WITH DIFFERENTIAL/PLATELET
Abs Immature Granulocytes: 0.1 10*3/uL — ABNORMAL HIGH (ref 0.00–0.07)
Basophils Absolute: 0.1 10*3/uL (ref 0.0–0.1)
Basophils Relative: 0 %
Eosinophils Absolute: 0.1 10*3/uL (ref 0.0–0.5)
Eosinophils Relative: 1 %
HCT: 27 % — ABNORMAL LOW (ref 39.0–52.0)
Hemoglobin: 8.8 g/dL — ABNORMAL LOW (ref 13.0–17.0)
Immature Granulocytes: 1 %
Lymphocytes Relative: 9 %
Lymphs Abs: 1.4 10*3/uL (ref 0.7–4.0)
MCH: 26.3 pg (ref 26.0–34.0)
MCHC: 32.6 g/dL (ref 30.0–36.0)
MCV: 80.6 fL (ref 80.0–100.0)
Monocytes Absolute: 0.5 10*3/uL (ref 0.1–1.0)
Monocytes Relative: 3 %
Neutro Abs: 13.9 10*3/uL — ABNORMAL HIGH (ref 1.7–7.7)
Neutrophils Relative %: 86 %
Platelets: 86 10*3/uL — ABNORMAL LOW (ref 150–400)
RBC: 3.35 MIL/uL — ABNORMAL LOW (ref 4.22–5.81)
RDW: 18.4 % — ABNORMAL HIGH (ref 11.5–15.5)
Smear Review: NORMAL
WBC: 15.9 10*3/uL — ABNORMAL HIGH (ref 4.0–10.5)
nRBC: 0 % (ref 0.0–0.2)

## 2019-03-31 LAB — BASIC METABOLIC PANEL
Anion gap: 11 (ref 5–15)
BUN: 41 mg/dL — ABNORMAL HIGH (ref 8–23)
CO2: 27 mmol/L (ref 22–32)
Calcium: 8.1 mg/dL — ABNORMAL LOW (ref 8.9–10.3)
Chloride: 101 mmol/L (ref 98–111)
Creatinine, Ser: 1.65 mg/dL — ABNORMAL HIGH (ref 0.61–1.24)
GFR calc Af Amer: 48 mL/min — ABNORMAL LOW (ref >60)
GFR calc non Af Amer: 41 mL/min — ABNORMAL LOW (ref >60)
Glucose, Bld: 153 mg/dL — ABNORMAL HIGH (ref 70–99)
Potassium: 4.6 mmol/L (ref 3.5–5.1)
Sodium: 139 mmol/L (ref 135–145)

## 2019-03-31 LAB — ECHOCARDIOGRAM COMPLETE
Height: 66 in
Weight: 2224 oz

## 2019-03-31 LAB — MAGNESIUM: Magnesium: 2.2 mg/dL (ref 1.7–2.4)

## 2019-03-31 LAB — TROPONIN I (HIGH SENSITIVITY)
Troponin I (High Sensitivity): 82 ng/L — ABNORMAL HIGH (ref <18)
Troponin I (High Sensitivity): 77 ng/L — ABNORMAL HIGH (ref <18)

## 2019-03-31 MED ORDER — ENOXAPARIN SODIUM 40 MG/0.4ML ~~LOC~~ SOLN
40.0000 mg | SUBCUTANEOUS | Status: DC
  Administered 2019-03-31: 40 mg via SUBCUTANEOUS
  Filled 2019-03-31: qty 0.4

## 2019-03-31 MED ORDER — DILTIAZEM HCL 25 MG/5ML IV SOLN
5.0000 mg | Freq: Once | INTRAVENOUS | Status: AC
  Administered 2019-03-31: 5 mg via INTRAVENOUS
  Filled 2019-03-31: qty 5

## 2019-03-31 MED ORDER — METOPROLOL TARTRATE 5 MG/5ML IV SOLN
5.0000 mg | Freq: Once | INTRAVENOUS | Status: AC
  Administered 2019-03-31: 5 mg via INTRAVENOUS
  Filled 2019-03-31: qty 5

## 2019-03-31 MED ORDER — CHLORPROMAZINE HCL 10 MG PO TABS
10.0000 mg | ORAL_TABLET | Freq: Four times a day (QID) | ORAL | Status: DC | PRN
  Administered 2019-03-31: 10 mg via ORAL
  Filled 2019-03-31 (×2): qty 1

## 2019-03-31 MED ORDER — AMIODARONE HCL 200 MG PO TABS
400.0000 mg | ORAL_TABLET | Freq: Two times a day (BID) | ORAL | Status: DC
  Administered 2019-03-31 – 2019-04-02 (×5): 400 mg via ORAL
  Filled 2019-03-31 (×5): qty 2

## 2019-03-31 MED ORDER — DILTIAZEM HCL 25 MG/5ML IV SOLN
5.0000 mg | Freq: Once | INTRAVENOUS | Status: DC
  Filled 2019-03-31: qty 5

## 2019-03-31 MED ORDER — DILTIAZEM HCL-DEXTROSE 125-5 MG/125ML-% IV SOLN (PREMIX)
5.0000 mg/h | INTRAVENOUS | Status: DC
  Administered 2019-03-31: 5 mg/h via INTRAVENOUS
  Administered 2019-03-31: 7.5 mg/h via INTRAVENOUS
  Administered 2019-03-31: 12.5 mg/h via INTRAVENOUS
  Administered 2019-03-31: 10 mg/h via INTRAVENOUS
  Filled 2019-03-31 (×2): qty 125

## 2019-03-31 NOTE — Progress Notes (Signed)
Sent messaged to NP Webb Silversmith at [facility] patient requested medication for hiccup. NP replied at 0007 "ok". At 0106 NP replied "hope the thorazine helped if not we may have to increase the dose".

## 2019-03-31 NOTE — Progress Notes (Signed)
At 0326 received call from telemetry patient is in AFIB RVR. At [phone] assessed patient per patient "no pain", vitals taken Temp 98.6, BP 126/100, o2 SAT 95% on room air, HR 135 per dinamap, and HR 165 per dinamap. Rapid response called at 0333. At 0337 messaged NP Jimmye Norman via Secure Chat/ notified her of the rapid response call (AFIB RVR). Received call from NP Webb Silversmith at [phone], NP placed orders for Cardizem (than d/c the order), IV metoprolol 5mg  was order and given, no improvement occurred after the medication was given. Rapid RN spoke with the NP. Orders were placed for the Cardezim 5mg  IV and was given. No improvement occurred after the medication, NP was made aware. Orders were placed to transfer the patient to telemetry for IV drip.

## 2019-03-31 NOTE — Progress Notes (Signed)
    BRIEF OVERNIGHT PROGRESS REPORT   SUBJECTIVE: Rapid response initiated for HR 170s sustaining aysmptomatic.  OBJECTIVE: On arrival to the bedside, he was afebrile with blood pressure106 /85 mm Hg and pulse rate 149 beats/min. There were no focal neurological deficits; he was alert and oriented x4, and denied associated symptoms of chest pain, SOB, palpitations, dizziness or cough.  ASSESSMENT: 69 y.o. male with medical hx of BPH, kidney stones status post staged lithotripsy,C. difficile, HTN, COPD, and remote history of alcohol use disorder now in remission, UGIB, and paroxysmal A. fib not on anticoagulantswho presentedwith urinary retention, malaise, generalized weakness and confusion.  PLAN: 1. Atrial Fibrillation with RVR - stat ECG obtained and showed afib with RVR - Check labs, troponin, BMP, Mag and CBC - IV metoprolol and Cardizem administered with no improvement - Start Diltiazem 0.25mg /kg (15mg ) IV x1 then maintain 5-15mg /hr drip - Transfer to Telemetry - Continue Metoprolol 12.5mg  PO bid  - Thromboembolism Risk Management with Lovenox, not on anticoagulation hx of UGIB - Echocardiogram pending - Cardiology  Consultation      , DNP, CCRN, FNP-C [facility] Nurse Practitioner Between 7pm to 7am - Pager [phone]  After 7am go to [email or web address] - password:[email or web address]  [facility]  [phone]

## 2019-03-31 NOTE — Plan of Care (Signed)
  Problem: Education: Goal: Knowledge of General Education information will improve Description: Including pain rating scale, medication(s)/side effects and non-pharmacologic comfort measures Outcome: Progressing   Problem: Clinical Measurements: Goal: Ability to maintain clinical measurements within normal limits will improve Outcome: Progressing Goal: Will remain free from infection Outcome: Progressing Goal: Diagnostic test results will improve Outcome: Progressing Goal: Respiratory complications will improve Outcome: Progressing  * Patient had AFIB with RVR, RRT called, Metoprolol 5mg  IV given and Cardizem 5mg  IV was given no change, patient transferred to Telemetry unit 2A to start drip.

## 2019-03-31 NOTE — Consult Note (Signed)
Cardiology Consultation:   Patient ID: Marvin Ortega MRN: [id number]; DOB: 05/02/1950  Admit date: 03/28/2019 Date of Consult: 03/31/2019  Primary Care Provider: Kandyce Rud, MD Primary Cardiologist: new to [facility] Physician requesting consult: Dr. Lurene Shadow Reason for consult: Atrial fibrillation with RVR    Patient Profile:   Marvin Ortega is a 69 y.o. male with a hx of BPH, paroxysmal atrial fibrillation, kidney stones with stage lithotripsy fall 2020, C. difficile, COPD, remote history alcohol, upper GI bleed, presenting with confusion, weakness, malaise, urinary retention.  Last night with atrial fibrillation with RVR   History of Present Illness:   Presented with urine retention, septic appearing and renal failure creatinine 3.96, hypotensive systolic pressure of 90s, tachycardic, lactic acid greater than 11  He was given aggressive fluid resuscitation on arrival to the emergency room, 1.5 L given quickly and started on 125 cc/h Telemetry showed sinus tachycardia, episodes of SVT rate up to 170s Diagnosed with sepsis due to UTI, Blood cultures urine cultures drawn Started on broad-spectrum antibiotics Outpatient metoprolol was held for hypotension -Noted to be markedly anemic hemoglobin 8 on arrival down to 6.8 after fluid hydration, given 1 unit packed red blood cells back up to 8.8 today  Yesterday with respiratory distress, IV fluids held, started on steroids, continued on antibiotics, started on nebulizers Wheezing and respiratory distress has been improving overnight He still reports significant coughing today  Overnight had several hours of atrial fibrillation with RVR, started on diltiazem infusion and eventually converted to normal sinus rhythm   Heart Pathway Score:     Past Medical History:  Diagnosis Date  . Atrial fibrillation (HCC)   . BPH (benign prostatic hyperplasia)   . COPD (chronic obstructive pulmonary disease) (HCC)   .  Dieulafoy lesion of stomach   . GERD (gastroesophageal reflux disease)   . GIB (gastrointestinal bleeding) 02/15/2018  . Hemorrhagic shock (HCC)   . History of kidney stones   . Hypertension   . Pre-diabetes   . Upper GI bleed     Past Surgical History:  Procedure Laterality Date  . CYSTOSCOPY W/ RETROGRADES Right 11/23/2018   Procedure: CYSTOSCOPY WITH RETROGRADE PYELOGRAM;  Surgeon: Riki Altes, MD;  Location: [facility] ORS;  Service: Urology;  Laterality: Right;  . CYSTOSCOPY WITH HOLMIUM LASER LITHOTRIPSY  11/30/2018   Procedure: CYSTOSCOPY WITH HOLMIUM LASER LITHOTRIPSY;  Surgeon: Riki Altes, MD;  Location: [facility] ORS;  Service: Urology;;  . Bluford Kaufmann WITH LITHOLAPAXY N/A 11/23/2018   Procedure: CYSTOSCOPY WITH LITHOLAPAXY;  Surgeon: Riki Altes, MD;  Location: [facility] ORS;  Service: Urology;  Laterality: N/A;  . CYSTOSCOPY WITH LITHOLAPAXY N/A 11/30/2018   Procedure: CYSTOSCOPY WITH LITHOLAPAXY;  Surgeon: Riki Altes, MD;  Location: [facility] ORS;  Service: Urology;  Laterality: N/A;  . CYSTOSCOPY/URETEROSCOPY/HOLMIUM LASER/STENT PLACEMENT Right 11/23/2018   Procedure: CYSTOSCOPY/URETEROSCOPY/HOLMIUM LASER/STENT PLACEMENT;  Surgeon: Riki Altes, MD;  Location: [facility] ORS;  Service: Urology;  Laterality: Right;  . ESOPHAGOGASTRODUODENOSCOPY N/A 02/15/2018   Procedure: ESOPHAGOGASTRODUODENOSCOPY (EGD);  Surgeon: Toney Reil, MD;  Location: [facility] ENDOSCOPY;  Service: Gastroenterology;  Laterality: N/A;     Home Medications:  Prior to Admission medications   Medication Sig Start Date End Date Taking? Authorizing Provider  COMBIVENT RESPIMAT 20-100 MCG/ACT AERS respimat Inhale 2 puffs into the lungs 4 (four) times daily as needed for shortness of breath. 02/21/19  Yes [provider]  finasteride (PROSCAR) 5 MG tablet Take 5 mg by mouth daily.   Yes [provider]  metoprolol succinate (TOPROL-XL) 50 MG 24 hr tablet Take 100 mg by mouth daily.    Yes  [provider]  pantoprazole (PROTONIX) 40 MG tablet Take 1 tablet (40 mg total) by mouth 2 (two) times daily. 02/19/18  Yes Mayo, Allyn KennerKaty Dodd, MD  Thiamine HCl (VITAMIN B-1) 250 MG tablet Take 250 mg by mouth daily.   Yes [provider]    Inpatient Medications: Scheduled Meds: . sodium chloride   Intravenous Once  . amiodarone  400 mg Oral BID  . Chlorhexidine Gluconate Cloth  6 each Topical Daily  . enoxaparin (LOVENOX) injection  30 mg Subcutaneous Q24H  . finasteride  5 mg Oral Daily  . ipratropium-albuterol  3 mL Nebulization Q6H  . methylPREDNISolone (SOLU-MEDROL) injection  80 mg Intravenous Q8H  . metoprolol tartrate  12.5 mg Oral BID  . potassium chloride  40 mEq Oral BID   Continuous Infusions: . cefTRIAXone (ROCEPHIN)  IV Stopped (03/30/19 1328)  . diltiazem (CARDIZEM) infusion Stopped (03/31/19 1016)  . lactated ringers 75 mL/hr at 03/31/19 1000   PRN Meds: acetaminophen **OR** acetaminophen, guaiFENesin-dextromethorphan, HYDROcodone-homatropine, ondansetron **OR** ondansetron (ZOFRAN) IV  Allergies:   No Known Allergies  Social History:   Social History   Socioeconomic History  . Marital status: Divorced    Spouse name: Not on file  . Number of children: Not on file  . Years of education: Not on file  . Highest education level: Not on file  Occupational History  . Not on file  Tobacco Use  . Smoking status: Never Smoker  . Smokeless tobacco: Never Used  Substance and Sexual Activity  . Alcohol use: Yes  . Drug use: Never  . Sexual activity: Yes    Birth control/protection: None  Other Topics Concern  . Not on file  Social History Narrative  . Not on file   Social Determinants of Health   Financial Resource Strain:   . Difficulty of Paying Living Expenses: Not on file  Food Insecurity:   . Worried About Programme researcher, broadcasting/film/videounning Out of Food in the Last Year: Not on file  . Ran Out of Food in the Last Year: Not on file  Transportation Needs:   .  Lack of Transportation (Medical): Not on file  . Lack of Transportation (Non-Medical): Not on file  Physical Activity:   . Days of Exercise per Week: Not on file  . Minutes of Exercise per Session: Not on file  Stress:   . Feeling of Stress : Not on file  Social Connections:   . Frequency of Communication with Friends and Family: Not on file  . Frequency of Social Gatherings with Friends and Family: Not on file  . Attends Religious Services: Not on file  . Active Member of Clubs or Organizations: Not on file  . Attends BankerClub or Organization Meetings: Not on file  . Marital Status: Not on file  Intimate Partner Violence:   . Fear of Current or Ex-Partner: Not on file  . Emotionally Abused: Not on file  . Physically Abused: Not on file  . Sexually Abused: Not on file    Family History:    Family History  Problem Relation Age of Onset  . Heart disease Father   . Aneurysm Sister      ROS:  Please see the history of present illness.  Review of Systems  Constitutional: Negative.   HENT: Negative.   Respiratory: Positive for cough and shortness of breath.   Cardiovascular: Negative.   Gastrointestinal:  Negative.   Musculoskeletal: Negative.   Neurological: Negative.   Psychiatric/Behavioral: Negative.   All other systems reviewed and are negative.   Physical Exam/Data:   Vitals:   03/31/19 0820 03/31/19 0832 03/31/19 0922 03/31/19 1122  BP: 92/68 91/75 118/74 107/70  Pulse: (!) 140 99 98   Resp:      Temp:      TempSrc:      SpO2: 94% 96%    Weight:      Height:        Intake/Output Summary (Last 24 hours) at 03/31/2019 1151 Last data filed at 03/31/2019 1000 Gross per 24 hour  Intake 2042.92 ml  Output 600 ml  Net 1442.92 ml   Last 3 Weights 03/31/2019 03/28/2019 12/30/2018  Weight (lbs) 162 lb 4.8 oz 139 lb 149 lb  Weight (kg) 73.619 kg 63.05 kg 67.586 kg     Body mass index is 26.2 kg/m.  General:  Well nourished, well developed, in no acute  distress HEENT: normal Lymph: no adenopathy Neck: no JVD Endocrine:  No thryomegaly Vascular: No carotid bruits; FA pulses 2+ bilaterally without bruits  Cardiac:  normal S1, S2; RRR; no murmur  Lungs: Coarse breath sounds, scattered wheezing Abd: soft, nontender, no hepatomegaly  Ext: no edema Musculoskeletal:  No deformities, BUE and BLE strength normal and equal Skin: warm and dry  Neuro:  CNs 2-12 intact, no focal abnormalities noted Psych:  Normal affect   EKG:  The EKG was personally reviewed from March 29, 2019 and demonstrates:   Shows sinus tachycardia rate 118 bpm nonspecific ST abnormality anterolateral leads V3 through V6  Telemetry:  Telemetry was personally reviewed and demonstrates: Normal sinus rhythm  Relevant CV Studies: Echocardiogram images personally reviewed by myself showing normal ejection fraction estimated greater than 55%, normal RV size and function Final report pending  Laboratory Data:  High Sensitivity Troponin:   Recent Labs  Lab 03/31/19 0603 03/31/19 0835  TROPONINIHS 82* 77*     Chemistry Recent Labs  Lab 03/30/19 0345 03/30/19 1737 03/31/19 0603  NA 139 140 139  K 3.0* 4.2 4.6  CL 97* 100 101  CO2 27 27 27   GLUCOSE 149* 198* 153*  BUN 40* 40* 41*  CREATININE 2.53* 2.19* 1.65*  CALCIUM 7.1* 7.8* 8.1*  GFRNONAA 25* 29* 41*  GFRAA 29* 34* 48*  ANIONGAP 15 13 11     No results for input(s): PROT, ALBUMIN, AST, ALT, ALKPHOS, BILITOT in the last 168 hours. Hematology Recent Labs  Lab 03/29/19 0212 03/29/19 0212 03/29/19 0547 03/29/19 1507 03/30/19 0345 03/31/19 0603  WBC 26.3*  --   --   --  19.1* 15.9*  RBC 2.60*  --  2.51*  --  3.06* 3.35*  HGB 6.8*   < >  --  8.9* 8.1* 8.8*  HCT 22.4*   < >  --  27.4* 24.3* 27.0*  MCV 86.2  --   --   --  79.4* 80.6  MCH 26.2  --   --   --  26.5 26.3  MCHC 30.4  --   --   --  33.3 32.6  RDW 19.6*  --   --   --  18.3* 18.4*  PLT 125*  --   --   --  86* 86*   < > = values in this  interval not displayed.   BNP Recent Labs  Lab 03/30/19 0345  BNP 283.0*    DDimer No results for input(s): DDIMER in the last  168 hours.   Radiology/Studies:  DG Abd 1 View  Result Date: 03/28/2019 CLINICAL DATA:  Difficulty urinating EXAM: ABDOMEN - 1 VIEW COMPARISON:  10/07/2018 CT of the abdomen FINDINGS: Scattered large and small bowel gas is noted. Left lower pole staghorn calculus is again noted and stable. No definitive renal or ureteral stones are seen. Previously noted bladder calculus has been removed in the interval. No bony abnormality is noted. 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 No acute abnormality noted. Electronically Signed   By: Alcide Clever M.D.   On: 03/28/2019 16:50   US Renal  Result Date: 03/28/2019 CLINICAL DATA:  Acute kidney injury EXAM: RENAL / URINARY TRACT ULTRASOUND COMPLETE COMPARISON:  CT abdomen 10/07/2018 FINDINGS: Right Kidney: Renal measurements: 10.6 by 5.7 by 5.7 cm = volume: 178.6 mL. Tiny echogenic foci without definite shadowing, no well-defined stones are identified. No renal mass noted. No hydronephrosis. Left Kidney: Renal measurements: 11.6 by 5.8 by 5.7 cm = volume: 200.6 mL. Large stone in the left kidney lower pole corresponding to previous findings at CT, approximately 1.6 cm in long axis. Normal renal echogenicity. Exophytic 1.4 cm cyst from the left kidney lower pole. Bladder: The urinary bladder is collapsed around the Foley catheter. Other: None. IMPRESSION: 1. 1.6 cm left kidney lower pole staghorn calculus as shown on prior CT. The smaller upper pole calculi previously present bilaterally on CT are not readily visible on today's ultrasound. 2. The urinary bladder appears collapsed around the Foley catheter. The very large bladder calculus shown on 10/07/2018 is not identified today. 3. No hydronephrosis.  Normal renal echogenicity. Electronically Signed   By: Gaylyn Rong M.D.   On: 03/28/2019  16:39   DG Chest Port 1 View  Result Date: 03/30/2019 CLINICAL DATA:  Dyspnea EXAM: PORTABLE CHEST 1 VIEW COMPARISON:  12/31/2018 FINDINGS: Cardiac shadow is at the upper limits of normal in size. Tortuosity of the thoracic aorta is noted somewhat accentuated by patient rotation. The lungs are clear. No focal infiltrate or effusion is noted. No bony abnormality is seen. IMPRESSION: No acute abnormality noted. Electronically Signed   By: Alcide Clever M.D.   On: 03/30/2019 10:05   ECHOCARDIOGRAM COMPLETE  Result Date: 03/31/2019   ECHOCARDIOGRAM REPORT   Patient Name:   Marvin Ortega Date of Exam: 03/30/2019 Medical Rec #:  [id number]         Height:       66.0 in Accession #:    [id number]        Weight:       139.0 lb Date of Birth:  11-01-1950         BSA:          1.71 m Patient Age:    68 years          BP:           114/72 mmHg Patient Gender: M                 HR:           124 bpm. Exam Location:  [facility] Procedure: 2D Echo, Cardiac Doppler and Limited Color Doppler Indications:     I47.2 Ventricular Tachycardia  History:         Patient has no prior history of Echocardiogram examinations.                  COPD; Risk Factors:Hypertension.  Sonographer:     Humphrey Rolls  RDCS (AE) Referring Phys:  [id number] BRENDA MORRISON Diagnosing Phys: Adrian Blackwater MD  Sonographer Comments: Technically difficult study due to poor echo windows. TDS due to tachycardia, Image acquisition challenging due to COPD and Image acquisition challenging due to respiratory motion. IMPRESSIONS  1. Left ventricular ejection fraction, by visual estimation, is 60 to 65%. The left ventricle has normal function. There is no left ventricular hypertrophy.  2. Left ventricular diastolic parameters are indeterminate.  3. The left ventricle has no regional wall motion abnormalities.  4. Global right ventricle has normal systolic function.The right ventricular size is normal. No increase in right ventricular wall thickness.  5. Left atrial size  was mildly dilated.  6. Right atrial size was mildly dilated.  7. The mitral valve is normal in structure. Trivial mitral valve regurgitation. No evidence of mitral stenosis.  8. The tricuspid valve is normal in structure.  9. The tricuspid valve is normal in structure. Tricuspid valve regurgitation is trivial. 10. The aortic valve is normal in structure. Aortic valve regurgitation is not visualized. No evidence of aortic valve sclerosis or stenosis. 11. The pulmonic valve was normal in structure. Pulmonic valve regurgitation is not visualized. 12. The inferior vena cava is normal in size with greater than 50% respiratory variability, suggesting right atrial pressure of 3 mmHg. FINDINGS  Left Ventricle: Left ventricular ejection fraction, by visual estimation, is 60 to 65%. The left ventricle has normal function. The left ventricle has no regional wall motion abnormalities. There is no left ventricular hypertrophy. Left ventricular diastolic parameters are indeterminate. Normal left atrial pressure. Right Ventricle: The right ventricular size is normal. No increase in right ventricular wall thickness. Global RV systolic function is has normal systolic function. Left Atrium: Left atrial size was mildly dilated. Right Atrium: Right atrial size was mildly dilated Pericardium: There is no evidence of pericardial effusion. Mitral Valve: The mitral valve is normal in structure. Trivial mitral valve regurgitation. No evidence of mitral valve stenosis by observation. MV peak gradient, 6.7 mmHg. Tricuspid Valve: The tricuspid valve is normal in structure. Tricuspid valve regurgitation is trivial. Aortic Valve: The aortic valve is normal in structure. Aortic valve regurgitation is not visualized. The aortic valve is structurally normal, with no evidence of sclerosis or stenosis. Aortic valve mean gradient measures 4.0 mmHg. Aortic valve peak gradient measures 7.3 mmHg. Aortic valve area, by VTI measures 3.73 cm. Pulmonic  Valve: The pulmonic valve was normal in structure. Pulmonic valve regurgitation is not visualized. Pulmonic regurgitation is not visualized. Aorta: The aortic root, ascending aorta and aortic arch are all structurally normal, with no evidence of dilitation or obstruction. Venous: The inferior vena cava is normal in size with greater than 50% respiratory variability, suggesting right atrial pressure of 3 mmHg. IAS/Shunts: No atrial level shunt detected by color flow Doppler. There is no evidence of a patent foramen ovale. No ventricular septal defect is seen or detected. There is no evidence of an atrial septal defect.  LEFT VENTRICLE PLAX 2D LVIDd:         3.71 cm LVIDs:         2.68 cm LV PW:         0.91 cm LV IVS:        0.83 cm LVOT diam:     2.30 cm LV SV:         32 ml LV SV Index:   18.63 LVOT Area:     4.15 cm  LEFT ATRIUM  Index LA diam:      2.70 cm 1.58 cm/m LA Vol (A4C): 25.1 ml 14.65 ml/m  AORTIC VALVE                   PULMONIC VALVE AV Area (Vmax):    3.32 cm    PV Vmax:       0.79 m/s AV Area (Vmean):   3.19 cm    PV Vmean:      48.600 cm/s AV Area (VTI):     3.73 cm    PV VTI:        0.102 m AV Vmax:           135.00 cm/s PV Peak grad:  2.5 mmHg AV Vmean:          93.700 cm/s PV Mean grad:  1.0 mmHg AV VTI:            0.186 m AV Peak Grad:      7.3 mmHg AV Mean Grad:      4.0 mmHg LVOT Vmax:         108.00 cm/s LVOT Vmean:        71.900 cm/s LVOT VTI:          0.167 m LVOT/AV VTI ratio: 0.90  AORTA Ao Root diam: 3.90 cm MITRAL VALVE MV Area (PHT): 10.99 cm            SHUNTS MV Peak grad:  6.7 mmHg             Systemic VTI:  0.17 m MV Mean grad:  2.0 mmHg             Systemic Diam: 2.30 cm MV Vmax:       1.29 m/s MV Vmean:      60.9 cm/s MV VTI:        0.16 m MV PHT:        20.01 msec MV Decel Time: 69 msec MV E velocity: 113.00 cm/s 103 cm/s  Adrian BlackwaterShaukat Khan MD Electronically signed by Adrian BlackwaterShaukat Khan MD Signature Date/Time: 03/31/2019/9:22:46 AM    Final      Assessment and Plan:    1. Atrial fibrillation with RVR Likely exacerbated by underlying respiratory distress, anemia, sepsis/UTI, fluids --Normal sinus rhythm restored on diltiazem infusion -He has high risk of recurrent arrhythmia given underlying bronchitis, fluid shifts, anemia --Not a good candidate for anticoagulation at this time given anemia, prior history upper GI bleed, ongoing work-up --In an effort to maintain normal sinus rhythm would continue diltiazem, will transition to oral dosing --We will also recommend restart amiodarone oral dosing 400 twice daily -Hold off on anticoagulation at this time With blood pressure low agree with transitioning diltiazem infusion to oral metoprolol 25 twice daily with titration upwards as tolerated  2.  Urosepsis Cultures showing Serratia Marcescens,  Sensitive to ceftriaxone Blood pressure slowly improving, [place] ptosis improving --Currently on fluids 75 cc/h.  May need to hold fluids for any worsening shortness of breath concerning for pulmonary edema  3.  Acute bronchitis Significant coughing, sputum production Improved symptoms compared to yesterday after steroids nebulizers and antibiotics Appears to be heading in the right direction, able to talk in full sentences today  4.  Acute on chronic renal failure Likely ATN in the setting of urosepsis Slow improvement with gentle IV fluids and antibiotics  Case discussed with nursing, also with hospitalist service  Total encounter time more than 110 minutes  Greater than 50% was spent in counseling  and coordination of care with the patient    For questions or updates, please contact [facility] Please consult [email or web address] for contact info under     Signed, Ida Rogue, MD  03/31/2019 11:51 AM

## 2019-03-31 NOTE — Progress Notes (Signed)
CH paged to pt.'s room for RR.  Pt. in room w/medical team gathered around bed when [facility] arrived.  Pt. appeared to have elevated heart rate, but did not appear to be in distress.  Ch remains available; no further needs expressed at this time.       03/31/19 0400  Clinical Encounter Type  Visited With Patient  Visit Type Other (Comment) (Rapid Response)  Referral From Nurse

## 2019-03-31 NOTE — Progress Notes (Signed)
PT Hold Note  Patient Details Name: Marvin Ortega MRN: [id number] DOB: Jan 21, 1951   Cancelled Treatment:    Reason Eval/Treat Not Completed: Medical issues which prohibited therapy. Chart reviewed and RN consulted. Per RN pt recently converted to NSR and is being converted over to PO meds for HR control. Currently not appropriate for PT until HR is more stable. Will continue to follow and attempt PT evaluation once pt is appropriate.  Marvin Ortega PT, DPT, GCS  Marvin Ortega 03/31/2019, 10:27 AM

## 2019-03-31 NOTE — Progress Notes (Addendum)
Patient on cardizem gtt at 12.5.  BP is running in the low 90's and unable to titrate up any further at this time.  Patients HR remains in the 140's-150's.  Dr. Myriam Forehand notified via secure chat.

## 2019-03-31 NOTE — Progress Notes (Signed)
Patient spontaneously converted back into NSR at 0844 per telemetry.  MD notified.

## 2019-03-31 NOTE — Progress Notes (Signed)
Progress Note    Marvin Ortega  OEV:[id number] DOB: 1950-07-29  DOA: 03/28/2019 PCP: Kandyce Rud, MD      Brief Narrative:    Medical records reviewed and are as summarized below:  Marvin Ortega is an 69 y.o. male with medical hx of BPH, kidney stones status post staged lithotripsy last fall c/b C. difficile, HTN, COPD, and remote history of alcohol use disorder now in remission, UGIB, and paroxysmal A. fib not on anticoagulantswho presentedwith urinary retention, malaise, generalized weakness and confusion.  In the ER, afebrile, HR 106, blood pressure 90/60, WBC 27.6K, hemoglobin 8.4, normocytic, and creatinine 3.96, up from baseline 1.1.Lactic acid >11. He was given ceftriaxone, IV fluids, and the hospitalist service were asked to evaluate for renal failure.    Assessment/Plan:   Principal Problem:   Acute renal failure (ARF) (HCC) Active Problems:   Essential (primary) hypertension   AA (alcohol abuse)   PAF (paroxysmal atrial fibrillation) (HCC)   COPD (chronic obstructive pulmonary disease) (HCC)   Acidosis   Normocytic anemia   Body mass index is 26.2 kg/m.    Sepsis secondary to Serratia marcescens UTI: Continue IV Rocephin.  Severe leukocytosis: Slowly improving.  On antibiotics.  Recent SVT/atrial fibrillation with rapid ventricular response: Converted to normal sinus rhythm Cardizem infusion discontinued.  Consulted cardiologist to assist with management.  He recommended amiodarone and metoprolol.  He said he is not a good candidate for anticoagulation at this time.  COPD exacerbation: Continue IV steroids and bronchodilators.  Hycodan syrup as needed for cough.  Postobstructive uropathy secondary to BPH/acute renal failure with anion gap metabolic acidosis: Continue Foley catheter.  Creatinine is slowly improving.  Metabolic acidosis has resolved.  Decrease IV fluids to 50 cc/h.  Outpatient follow-up with urologist in 2 weeks.  Severe  iron deficiency anemia: Status post transfusion with 1 unit of packed red blood cells.  Outpatient follow-up for further work-up.    Family Communication/Anticipated D/C date and plan/Code Status   DVT prophylaxis: Lovenox Code Status: Full code Family Communication: Plan discussed with patient Disposition Plan: Possible discharge to home in 2 to 3 days      Subjective:   Overnight events noted.  Patient went into rapid atrial fibrillation requiring IV metoprolol and IV Cardizem bolus followed by IV Cardizem infusion. By the time  I saw him, he had converted to normal sinus rhythm.  Coughing and breathing are a little better today.  Objective:    Vitals:   03/31/19 0832 03/31/19 0922 03/31/19 1122 03/31/19 1613  BP: 91/75 118/74 107/70 118/87  Pulse: 99 98  88  Resp:    14  Temp:    (!) 97.4 F (36.3 C)  TempSrc:    Oral  SpO2: 96%   96%  Weight:      Height:        Intake/Output Summary (Last 24 hours) at 03/31/2019 1700 Last data filed at 03/31/2019 1000 Gross per 24 hour  Intake 1562.92 ml  Output --  Net 1562.92 ml   Filed Weights   03/28/19 1344 03/31/19 0600  Weight: 63 kg 73.6 kg    Exam:  GEN: No acute distress. SKIN: No rash EYES: EOMI ENT: MMM CV: Slow rate and rhythm. PULM: Improved air entry, bilateral wheezing but improved.  No rales heard ABD: soft, ND, NT, +BS CNS: AAO x 3, non focal EXT: No edema or tenderness GU: Foley cath with amber urine  Data Reviewed:   I have  personally reviewed following labs and imaging studies:  Labs: Labs show the following:   Basic Metabolic Panel: Recent Labs  Lab 03/29/19 0038 03/29/19 0212 03/29/19 0547 03/29/19 0547 03/29/19 1507 03/29/19 1507 03/30/19 0345 03/30/19 0345 03/30/19 1737 03/31/19 0603  NA 139   < > 141  --  141  --  139  --  140 139  K 3.2*   < > 3.6   < > 3.5   < > 3.0*   < > 4.2 4.6  CL 106   < > 104  --  99  --  97*  --  100 101  CO2 14*   < > 18*  --  24  --  27  --  27  27  GLUCOSE 124*   < > 182*  --  148*  --  149*  --  198* 153*  BUN 29*   < > 31*  --  38*  --  40*  --  40* 41*  CREATININE 3.66*   < > 3.27*  --  3.35*  --  2.53*  --  2.19* 1.65*  CALCIUM 7.6*   < > 7.1*  --  7.3*  --  7.1*  --  7.8* 8.1*  MG 1.3*  --   --   --  2.2  --  2.1  --   --  2.2   < > = values in this interval not displayed.   GFR Estimated Creatinine Clearance: 37.6 mL/min (A) (by C-G formula based on SCr of 1.65 mg/dL (H)). Liver Function Tests: No results for input(s): AST, ALT, ALKPHOS, BILITOT, PROT, ALBUMIN in the last 168 hours. No results for input(s): LIPASE, AMYLASE in the last 168 hours. No results for input(s): AMMONIA in the last 168 hours. Coagulation profile No results for input(s): INR, PROTIME in the last 168 hours.  CBC: Recent Labs  Lab 03/28/19 1358 03/29/19 0212 03/29/19 1507 03/30/19 0345 03/31/19 0603  WBC 27.6* 26.3*  --  19.1* 15.9*  NEUTROABS  --   --   --   --  13.9*  HGB 8.4* 6.8* 8.9* 8.1* 8.8*  HCT 28.1* 22.4* 27.4* 24.3* 27.0*  MCV 87.0 86.2  --  79.4* 80.6  PLT 177 125*  --  86* 86*   Cardiac Enzymes: No results for input(s): CKTOTAL, CKMB, CKMBINDEX, TROPONINI in the last 168 hours. BNP (last 3 results) No results for input(s): PROBNP in the last 8760 hours. CBG: Recent Labs  Lab 03/29/19 0014  GLUCAP 89   D-Dimer: No results for input(s): DDIMER in the last 72 hours. Hgb A1c: No results for input(s): HGBA1C in the last 72 hours. Lipid Profile: No results for input(s): CHOL, HDL, LDLCALC, TRIG, CHOLHDL, LDLDIRECT in the last 72 hours. Thyroid function studies: No results for input(s): TSH, T4TOTAL, T3FREE, THYROIDAB in the last 72 hours.  Invalid input(s): FREET3 Anemia work up: Recent Labs    03/29/19 0547  RETICCTPCT 1.8   Sepsis Labs: Recent Labs  Lab 03/28/19 1358 03/28/19 1546 03/29/19 0212 03/29/19 0547 03/29/19 2038 03/29/19 2159 03/30/19 0345 03/30/19 0619 03/31/19 0603  WBC 27.6*  --  26.3*   --   --   --  19.1*  --  15.9*  LATICACIDVEN  --    < > 9.0*   < > 7.1* 5.9* 4.4* 4.3*  --    < > = values in this interval not displayed.    Microbiology Recent Results (from the past 240 hour(s))  Urine C&S     Status: Abnormal   Collection Time: 03/28/19  1:58 PM   Specimen: Urine, Random  Result Value Ref Range Status   Specimen Description   Final    URINE, RANDOM Performed at [facility], [address] [place], [place] [address]    Special Requests   Final    NONE Performed at [facility], [address] [place], [place] [address]    Culture >=100,000 COLONIES/mL SERRATIA MARCESCENS (A)  Final   Report Status 03/30/2019 FINAL  Final   Organism ID, Bacteria SERRATIA MARCESCENS (A)  Final      Susceptibility   Serratia marcescens - MIC*    CEFAZOLIN >=64 RESISTANT Resistant     CEFTRIAXONE <=0.25 SENSITIVE Sensitive     CIPROFLOXACIN <=0.25 SENSITIVE Sensitive     GENTAMICIN <=1 SENSITIVE Sensitive     NITROFURANTOIN 256 RESISTANT Resistant     TRIMETH/SULFA <=20 SENSITIVE Sensitive     * >=100,000 COLONIES/mL SERRATIA MARCESCENS  SARS CORONAVIRUS 2 (TAT 6-24 HRS) Nasopharyngeal Nasopharyngeal Swab     Status: None   Collection Time: 03/28/19  3:46 PM   Specimen: Nasopharyngeal Swab  Result Value Ref Range Status   SARS Coronavirus 2 NEGATIVE NEGATIVE Final    Comment: (NOTE) SARS-CoV-2 target nucleic acids are NOT DETECTED. The SARS-CoV-2 RNA is generally detectable in upper and lower respiratory specimens during the acute phase of infection. Negative results do not preclude SARS-CoV-2 infection, do not rule out co-infections with other pathogens, and should not be used as the sole basis for treatment or other patient management decisions. Negative results must be combined with clinical observations, patient history, and epidemiological information. The expected result is Negative. Fact Sheet for  Patients: HairSlick.nohttps://www.fda.gov/media/138098/download Fact Sheet for Healthcare Providers: quierodirigir.comhttps://www.fda.gov/media/138095/download This test is not yet approved or cleared by the [place] FDA and  has been authorized for detection and/or diagnosis of SARS-CoV-2 by FDA under an Emergency Use Authorization (EUA). This EUA will remain  in effect (meaning this test can be used) for the duration of the COVID-19 declaration under Section 56 4(b)(1) of the Act, 21 U.S.C. section 360bbb-3(b)(1), unless the authorization is terminated or revoked sooner. Performed at [facility], [address] [place], [place] [address]   CULTURE, BLOOD (ROUTINE X 2) w Reflex to ID Panel     Status: None (Preliminary result)   Collection Time: 03/28/19  4:38 PM   Specimen: BLOOD  Result Value Ref Range Status   Specimen Description BLOOD BLOOD LEFT HAND  Final   Special Requests   Final    BOTTLES DRAWN AEROBIC AND ANAEROBIC Blood Culture adequate volume   Culture   Final    NO GROWTH 3 DAYS Performed at [facility], [address] [place], [place] [address]    Report Status PENDING  Incomplete  CULTURE, BLOOD (ROUTINE X 2) w Reflex to ID Panel     Status: None (Preliminary result)   Collection Time: 03/28/19  4:54 PM   Specimen: BLOOD  Result Value Ref Range Status   Specimen Description BLOOD BLOOD RIGHT FOREARM  Final   Special Requests   Final    BOTTLES DRAWN AEROBIC AND ANAEROBIC Blood Culture adequate volume   Culture   Final    NO GROWTH 3 DAYS Performed at [facility], [address] [place], [place] [address]    Report Status PENDING  Incomplete    Procedures and diagnostic studies:  [facility]  1 View  Result Date: 03/30/2019 CLINICAL DATA:  Dyspnea EXAM: PORTABLE CHEST 1 VIEW COMPARISON:  12/31/2018 FINDINGS: Cardiac shadow is at the upper limits of normal in size. Tortuosity of the thoracic aorta is noted somewhat accentuated by patient rotation.  The lungs are clear. No focal infiltrate or effusion is noted. No bony abnormality is seen. IMPRESSION: No acute abnormality noted. Electronically Signed   By: Inez Catalina M.D.   On: 03/30/2019 10:05   ECHOCARDIOGRAM COMPLETE  Result Date: 03/31/2019   ECHOCARDIOGRAM REPORT   Patient Name:   Marvin Ortega Date of Exam: 03/30/2019 Medical Rec #:  [id number]         Height:       66.0 in Accession #:    [id number]        Weight:       139.0 lb Date of Birth:  05/21/1950         BSA:          1.71 m Patient Age:    71 years          BP:           114/72 mmHg Patient Gender: M                 HR:           124 bpm. Exam Location:  [facility] Procedure: 2D Echo, Cardiac Doppler and Limited Color Doppler Indications:     I47.2 Ventricular Tachycardia  History:         Patient has no prior history of Echocardiogram examinations.                  COPD; Risk Factors:Hypertension.  Sonographer:     Charmayne Sheer RDCS (AE) Referring Phys:  [id number] BRENDA MORRISON Diagnosing Phys: Neoma Laming MD  Sonographer Comments: Technically difficult study due to poor echo windows. TDS due to tachycardia, Image acquisition challenging due to COPD and Image acquisition challenging due to respiratory motion. IMPRESSIONS  1. Left ventricular ejection fraction, by visual estimation, is 60 to 65%. The left ventricle has normal function. There is no left ventricular hypertrophy.  2. Left ventricular diastolic parameters are indeterminate.  3. The left ventricle has no regional wall motion abnormalities.  4. Global right ventricle has normal systolic function.The right ventricular size is normal. No increase in right ventricular wall thickness.  5. Left atrial size was mildly dilated.  6. Right atrial size was mildly dilated.  7. The mitral valve is normal in structure. Trivial mitral valve regurgitation. No evidence of mitral stenosis.  8. The tricuspid valve is normal in structure.  9. The tricuspid valve is normal in structure. Tricuspid valve  regurgitation is trivial. 10. The aortic valve is normal in structure. Aortic valve regurgitation is not visualized. No evidence of aortic valve sclerosis or stenosis. 11. The pulmonic valve was normal in structure. Pulmonic valve regurgitation is not visualized. 12. The inferior vena cava is normal in size with greater than 50% respiratory variability, suggesting right atrial pressure of 3 mmHg. FINDINGS  Left Ventricle: Left ventricular ejection fraction, by visual estimation, is 60 to 65%. The left ventricle has normal function. The left ventricle has no regional wall motion abnormalities. There is no left ventricular hypertrophy. Left ventricular diastolic parameters are indeterminate. Normal left atrial pressure. Right Ventricle: The right ventricular size is normal. No increase in right ventricular wall thickness. Global RV systolic function is has normal systolic function. Left Atrium: Left atrial size was  mildly dilated. Right Atrium: Right atrial size was mildly dilated Pericardium: There is no evidence of pericardial effusion. Mitral Valve: The mitral valve is normal in structure. Trivial mitral valve regurgitation. No evidence of mitral valve stenosis by observation. MV peak gradient, 6.7 mmHg. Tricuspid Valve: The tricuspid valve is normal in structure. Tricuspid valve regurgitation is trivial. Aortic Valve: The aortic valve is normal in structure. Aortic valve regurgitation is not visualized. The aortic valve is structurally normal, with no evidence of sclerosis or stenosis. Aortic valve mean gradient measures 4.0 mmHg. Aortic valve peak gradient measures 7.3 mmHg. Aortic valve area, by VTI measures 3.73 cm. Pulmonic Valve: The pulmonic valve was normal in structure. Pulmonic valve regurgitation is not visualized. Pulmonic regurgitation is not visualized. Aorta: The aortic root, ascending aorta and aortic arch are all structurally normal, with no evidence of dilitation or obstruction. Venous: The  inferior vena cava is normal in size with greater than 50% respiratory variability, suggesting right atrial pressure of 3 mmHg. IAS/Shunts: No atrial level shunt detected by color flow Doppler. There is no evidence of a patent foramen ovale. No ventricular septal defect is seen or detected. There is no evidence of an atrial septal defect.  LEFT VENTRICLE PLAX 2D LVIDd:         3.71 cm LVIDs:         2.68 cm LV PW:         0.91 cm LV IVS:        0.83 cm LVOT diam:     2.30 cm LV SV:         32 ml LV SV Index:   18.63 LVOT Area:     4.15 cm  LEFT ATRIUM           Index LA diam:      2.70 cm 1.58 cm/m LA Vol (A4C): 25.1 ml 14.65 ml/m  AORTIC VALVE                   PULMONIC VALVE AV Area (Vmax):    3.32 cm    PV Vmax:       0.79 m/s AV Area (Vmean):   3.19 cm    PV Vmean:      48.600 cm/s AV Area (VTI):     3.73 cm    PV VTI:        0.102 m AV Vmax:           135.00 cm/s PV Peak grad:  2.5 mmHg AV Vmean:          93.700 cm/s PV Mean grad:  1.0 mmHg AV VTI:            0.186 m AV Peak Grad:      7.3 mmHg AV Mean Grad:      4.0 mmHg LVOT Vmax:         108.00 cm/s LVOT Vmean:        71.900 cm/s LVOT VTI:          0.167 m LVOT/AV VTI ratio: 0.90  AORTA Ao Root diam: 3.90 cm MITRAL VALVE MV Area (PHT): 10.99 cm            SHUNTS MV Peak grad:  6.7 mmHg             Systemic VTI:  0.17 m MV Mean grad:  2.0 mmHg             Systemic Diam: 2.30 cm MV Vmax:  1.29 m/s MV Vmean:      60.9 cm/s MV VTI:        0.16 m MV PHT:        20.01 msec MV Decel Time: 69 msec MV E velocity: 113.00 cm/s 103 cm/s  Adrian Blackwater MD Electronically signed by Adrian Blackwater MD Signature Date/Time: 03/31/2019/9:22:46 AM    Final     Medications:   . amiodarone  400 mg Oral BID  . Chlorhexidine Gluconate Cloth  6 each Topical Daily  . enoxaparin (LOVENOX) injection  40 mg Subcutaneous Q24H  . finasteride  5 mg Oral Daily  . ipratropium-albuterol  3 mL Nebulization Q6H  . methylPREDNISolone (SOLU-MEDROL) injection  80 mg Intravenous  Q8H  . metoprolol tartrate  12.5 mg Oral BID  . potassium chloride  40 mEq Oral BID   Continuous Infusions: . cefTRIAXone (ROCEPHIN)  IV 1 g (03/31/19 1318)  . diltiazem (CARDIZEM) infusion Stopped (03/31/19 1016)  . lactated ringers 75 mL/hr at 03/31/19 1000     LOS: 3 days   Pete Merten  [facility]   *Please refer to [email or web address], password [email or web address] to get updated schedule on who will round on this patient, as hospitalists switch teams weekly. If 7PM-7AM, please contact night-coverage at [email or web address], password [email or web address] for any overnight needs.  03/31/2019, 5:00 PM

## 2019-03-31 NOTE — Care Management Important Message (Signed)
Important Message  Patient Details  Name: Marvin Ortega MRN: [id number] Date of Birth: Aug 21, 1950   Medicare Important Message Given:  Yes     Olegario Messier A Danecia Underdown 03/31/2019, 10:41 AM

## 2019-03-31 NOTE — Progress Notes (Signed)
Received pt from 1C unit in transfer due to AF with RVR with rate in 160's, in need of Cardizem drip. Pt titrated up to 12.5 mg/hour and tolerated well, with stable but low normal BP's; HR initially in 160's, down to 140's at this time. Pt also receiving LR at 75 ml/hr. VS monitored per protocol; pt dozing intermittently since initiation of drip. O2 sats stable on RA, upper 90's, with cont pulse ox.  Pt A&O x 4, in no distress tolerating rhythm as well as diltiazem drip. Reported to oncoming shift RN.

## 2019-03-31 NOTE — Progress Notes (Signed)
Rapid Response called to room pt experiencing elevated HR . EKG performed pt was in Afib with RVR160's. Pt asymptomatic. New orders received from [facility], NP for metoprolol and Cardizem push which was not effective. Pt has transfer orders to telemetry unit.VS 106/75 (85) 97% rm air. HR 140's. Prior to transfer.

## 2019-04-01 DIAGNOSIS — D649 Anemia, unspecified: Secondary | ICD-10-CM

## 2019-04-01 DIAGNOSIS — R0603 Acute respiratory distress: Secondary | ICD-10-CM

## 2019-04-01 DIAGNOSIS — N10 Acute tubulo-interstitial nephritis: Secondary | ICD-10-CM

## 2019-04-01 LAB — CBC WITH DIFFERENTIAL/PLATELET
Abs Immature Granulocytes: 0.09 10*3/uL — ABNORMAL HIGH (ref 0.00–0.07)
Basophils Absolute: 0 10*3/uL (ref 0.0–0.1)
Basophils Relative: 0 %
Eosinophils Absolute: 0 10*3/uL (ref 0.0–0.5)
Eosinophils Relative: 0 %
HCT: 25.6 % — ABNORMAL LOW (ref 39.0–52.0)
Hemoglobin: 8.3 g/dL — ABNORMAL LOW (ref 13.0–17.0)
Immature Granulocytes: 1 %
Lymphocytes Relative: 11 %
Lymphs Abs: 1.4 10*3/uL (ref 0.7–4.0)
MCH: 25.9 pg — ABNORMAL LOW (ref 26.0–34.0)
MCHC: 32.4 g/dL (ref 30.0–36.0)
MCV: 80 fL (ref 80.0–100.0)
Monocytes Absolute: 0.6 10*3/uL (ref 0.1–1.0)
Monocytes Relative: 5 %
Neutro Abs: 10.4 10*3/uL — ABNORMAL HIGH (ref 1.7–7.7)
Neutrophils Relative %: 83 %
Platelets: 79 10*3/uL — ABNORMAL LOW (ref 150–400)
RBC: 3.2 MIL/uL — ABNORMAL LOW (ref 4.22–5.81)
RDW: 18.6 % — ABNORMAL HIGH (ref 11.5–15.5)
Smear Review: NORMAL
WBC: 12.6 10*3/uL — ABNORMAL HIGH (ref 4.0–10.5)
nRBC: 0 % (ref 0.0–0.2)

## 2019-04-01 LAB — BASIC METABOLIC PANEL
Anion gap: 9 (ref 5–15)
BUN: 48 mg/dL — ABNORMAL HIGH (ref 8–23)
CO2: 26 mmol/L (ref 22–32)
Calcium: 7.7 mg/dL — ABNORMAL LOW (ref 8.9–10.3)
Chloride: 102 mmol/L (ref 98–111)
Creatinine, Ser: 1.45 mg/dL — ABNORMAL HIGH (ref 0.61–1.24)
GFR calc Af Amer: 56 mL/min — ABNORMAL LOW (ref >60)
GFR calc non Af Amer: 48 mL/min — ABNORMAL LOW (ref >60)
Glucose, Bld: 140 mg/dL — ABNORMAL HIGH (ref 70–99)
Potassium: 4.5 mmol/L (ref 3.5–5.1)
Sodium: 137 mmol/L (ref 135–145)

## 2019-04-01 MED ORDER — METOPROLOL TARTRATE 25 MG PO TABS
25.0000 mg | ORAL_TABLET | Freq: Two times a day (BID) | ORAL | Status: DC
  Administered 2019-04-01 – 2019-04-02 (×3): 25 mg via ORAL
  Filled 2019-04-01 (×3): qty 1

## 2019-04-01 MED ORDER — PREDNISONE 20 MG PO TABS
40.0000 mg | ORAL_TABLET | Freq: Every day | ORAL | Status: DC
  Administered 2019-04-02: 40 mg via ORAL
  Filled 2019-04-01: qty 2

## 2019-04-01 MED ORDER — LORATADINE 10 MG PO TABS
10.0000 mg | ORAL_TABLET | Freq: Every day | ORAL | Status: DC
  Administered 2019-04-01 – 2019-04-02 (×2): 10 mg via ORAL
  Filled 2019-04-01 (×2): qty 1

## 2019-04-01 MED ORDER — SODIUM CHLORIDE 0.9 % IV SOLN
INTRAVENOUS | Status: DC | PRN
  Administered 2019-04-01: 75 mL/h via INTRAVENOUS
  Administered 2019-04-02: 50 mL via INTRAVENOUS

## 2019-04-01 MED ORDER — IPRATROPIUM-ALBUTEROL 0.5-2.5 (3) MG/3ML IN SOLN: 3.0000 mL | RESPIRATORY_TRACT | Status: DC | PRN

## 2019-04-01 NOTE — Progress Notes (Signed)
Progress Note  Patient Name: Marvin Ortega Date of Encounter: 04/01/2019  Primary Cardiologist: new to [facility] - consult by [facility]  Subjective   Breathing improving. Cough persists, mostly after eating. Maintaining sinus rhythm. Renal function continues to improve. Remains on IV fluids. HGB low, though stable.   Inpatient Medications    Scheduled Meds: . amiodarone  400 mg Oral BID  . Chlorhexidine Gluconate Cloth  6 each Topical Daily  . enoxaparin (LOVENOX) injection  40 mg Subcutaneous Q24H  . finasteride  5 mg Oral Daily  . ipratropium-albuterol  3 mL Nebulization Q6H  . methylPREDNISolone (SOLU-MEDROL) injection  80 mg Intravenous Q8H  . metoprolol tartrate  25 mg Oral BID  . potassium chloride  40 mEq Oral BID   Continuous Infusions: . cefTRIAXone (ROCEPHIN)  IV Stopped (03/31/19 1352)  . diltiazem (CARDIZEM) infusion Stopped (03/31/19 1016)  . lactated ringers 50 mL/hr at 04/01/19 0054   PRN Meds: acetaminophen **OR** acetaminophen, guaiFENesin-dextromethorphan, HYDROcodone-homatropine, ondansetron **OR** ondansetron (ZOFRAN) IV   Vital Signs    Vitals:   03/31/19 2016 04/01/19 0215 04/01/19 0330 04/01/19 0746  BP:   106/76 125/83  Pulse:   83 71  Resp:   18 16  Temp:   98.1 F (36.7 C) 98.1 F (36.7 C)  TempSrc:   Oral Oral  SpO2: 94% 96% 95% 93%  Weight:   75.9 kg   Height:        Intake/Output Summary (Last 24 hours) at 04/01/2019 0801 Last data filed at 04/01/2019 0339 Gross per 24 hour  Intake 2974.37 ml  Output 1125 ml  Net 1849.37 ml   Filed Weights   03/28/19 1344 03/31/19 0600 04/01/19 0330  Weight: 63 kg 73.6 kg 75.9 kg    Telemetry    Converted to sinus rhythm around 8:43 AM on 1/21 and has maintained sinus since with PACs - Personally Reviewed  ECG    No new tracings - Personally Reviewed  Physical Exam   GEN: No acute distress.   Neck: No JVD. Cardiac: RRR, no murmurs, rubs, or gallops.  Respiratory: Diminished, coarse  breath sounds bilaterally with wheezing.  GI: Soft, nontender, non-distended.   MS: No edema; No deformity. Neuro:  Alert and oriented x 3; Nonfocal.  Psych: Normal affect.  Labs    Chemistry Recent Labs  Lab 03/30/19 1737 03/31/19 0603 04/01/19 0645  NA 140 139 137  K 4.2 4.6 4.5  CL 100 101 102  CO2 27 27 26   GLUCOSE 198* 153* 140*  BUN 40* 41* 48*  CREATININE 2.19* 1.65* 1.45*  CALCIUM 7.8* 8.1* 7.7*  GFRNONAA 29* 41* 48*  GFRAA 34* 48* 56*  ANIONGAP 13 11 9      Hematology Recent Labs  Lab 03/30/19 0345 03/31/19 0603 04/01/19 0645  WBC 19.1* 15.9* 12.6*  RBC 3.06* 3.35* 3.20*  HGB 8.1* 8.8* 8.3*  HCT 24.3* 27.0* 25.6*  MCV 79.4* 80.6 80.0  MCH 26.5 26.3 25.9*  MCHC 33.3 32.6 32.4  RDW 18.3* 18.4* 18.6*  PLT 86* 86* 79*    Cardiac EnzymesNo results for input(s): TROPONINI in the last 168 hours. No results for input(s): TROPIPOC in the last 168 hours.   BNP Recent Labs  Lab 03/30/19 0345  BNP 283.0*     DDimer No results for input(s): DDIMER in the last 168 hours.   Radiology    DG Chest Port 1 View  Result Date: 03/30/2019 IMPRESSION: No acute abnormality noted. Electronically Signed   By: 04/01/19  Lukens M.D.   On: 03/30/2019 10:05    Cardiac Studies   2D Echo 03/30/2019, read by outside group: 1. Left ventricular ejection fraction, by visual estimation, is 60 to 65%. The left ventricle has normal function. There is no left ventricular hypertrophy.  2. Left ventricular diastolic parameters are indeterminate.  3. The left ventricle has no regional wall motion abnormalities.  4. Global right ventricle has normal systolic function.The right ventricular size is normal. No increase in right ventricular wall thickness.  5. Left atrial size was mildly dilated.  6. Right atrial size was mildly dilated.  7. The mitral valve is normal in structure. Trivial mitral valve regurgitation. No evidence of mitral stenosis.  8. The tricuspid valve is normal in  structure.  9. The tricuspid valve is normal in structure. Tricuspid valve regurgitation is trivial. 10. The aortic valve is normal in structure. Aortic valve regurgitation is not visualized. No evidence of aortic valve sclerosis or stenosis. 11. The pulmonic valve was normal in structure. Pulmonic valve regurgitation is not visualized. 12. The inferior vena cava is normal in size with greater than 50% respiratory variability, suggesting right atrial pressure of 3 mmHg.  Patient Profile     69 y.o. male with history of PAF, kidney stones with stage lithotripsy fall 2020, C. difficile, COPD, remote history alcohol, upper GI bleed, and BPH admitted with acute on chronic renal failure with obstructive uropathy, bronchitis, Afib with RVR/SVT.   Assessment & Plan    1. Afib with RVR/SVT: -Maintaining sinus rhythm with PACs -Not a good candidate for anticoagulation at this time given anemia and history of GI bleed -Started on PO amiodarone 1/21 in an effort to maintain sinus rhythm, will continue 10 gram load -Titrate metoprolol to 25 mg bid -CHADS2VASc at least 2 (HTN, age x 1)  2. Obstructive uropathy/urosepsis: -Cultures growing Serratia  -Renal function improving as below -ABX per IM  3. ARF: -Improving with IV fluids -In the setting of obstructive uropathy/BPH -Consider stopping IV fluids -PO hydration   4. Anemia: -HGB low, though stable -Not on [place] as above  5. Bronchitis: -  For questions or updates, please contact [place] Please consult [email or web address] for contact info under Cardiology/STEMI.    Signed, Christell Faith, PA-C [place] Pager: [phone] 04/01/2019, 8:01 AM

## 2019-04-01 NOTE — Evaluation (Signed)
Physical Therapy Evaluation Patient Details Name: Marvin Ortega MRN: [id number] DOB: July 06, 1950 Today's Date: 04/01/2019   History of Present Illness  Pt admitted for ARF with complaints of weakness x 3 days. HIstory includes COPD, GERD, GIB, and HTN. Pt previously on cardizem drip, however HR under control now.  Clinical Impression  Pt is a pleasant 69 year old male who was admitted for ARF with complaints of weakness x 3 days. Pt performs bed mobility/transfers independently and limited ambulation with cga and no AD. Pt unsteady without AD, performed further ambulation in hallway with RW with improved balance. Currently recommending continued use of AD for all mobility training. Pt demonstrates deficits with endurance/strength/balance/mobility. HR under good control with exertion. Would benefit from skilled PT to address above deficits and promote optimal return to PLOF. Recommend transition to HHPT upon discharge from acute hospitalization.     Follow Up Recommendations Home health PT    Equipment Recommendations  None recommended by PT    Recommendations for Other Services       Precautions / Restrictions Precautions Precautions: Fall Restrictions Weight Bearing Restrictions: No      Mobility  Bed Mobility Overal bed mobility: Independent             General bed mobility comments: safe technique with upright posture. Ease of transition to EOB  Transfers Overall transfer level: Independent Equipment used: None             General transfer comment: safe technique, however slight unsteadiness once he reaches upright posture.   Ambulation/Gait Ambulation/Gait assistance: Min guard Gait Distance (Feet): 10 Feet Assistive device: None Gait Pattern/deviations: Step-to pattern     General Gait Details: slight staggering gait pattern without AD. Pt begins to reach out for furniture and feels unsteady. Further ambulation performed on ther-ex with AD.  Stairs             Wheelchair Mobility    Modified Rankin (Stroke Patients Only)       Balance Overall balance assessment: Modified Independent                                           Pertinent Vitals/Pain Pain Assessment: No/denies pain    Home Living Family/patient expects to be discharged to:: Private residence Living Arrangements: Alone   Type of Home: Apartment Home Access: Level entry       Home Equipment: Walker - 2 wheels;Cane - single point      Prior Function Level of Independence: Independent         Comments: Community ambulator without AD, able to drive and obtain groceries on his own.     Hand Dominance        Extremity/Trunk Assessment   Upper Extremity Assessment Upper Extremity Assessment: Overall WFL for tasks assessed    Lower Extremity Assessment Lower Extremity Assessment: Generalized weakness(B LE grossly 4/5)       Communication   Communication: No difficulties  Cognition Arousal/Alertness: Awake/alert Behavior During Therapy: WFL for tasks assessed/performed Overall Cognitive Status: Within Functional Limits for tasks assessed                                 General Comments: began coughing vigrously, then complaining of chest discomfort; RN notified      General Comments  Exercises Other Exercises Other Exercises: Further ambulation performed around RN station with RW and cga. Safe technique without LOB, however pt does fatigue with increased distance. HR maintained at 96bpm with exertion and 91bpm at rest.    Assessment/Plan    PT Assessment Patient needs continued PT services  PT Problem List Decreased balance;Decreased activity tolerance;Decreased strength;Decreased mobility       PT Treatment Interventions DME instruction;Gait training;Therapeutic exercise;Balance training    PT Goals (Current goals can be found in the Care Plan section)  Acute Rehab PT Goals Patient Stated  Goal: to go home PT Goal Formulation: With patient Time For Goal Achievement: 04/15/19 Potential to Achieve Goals: Good    Frequency Min 2X/week   Barriers to discharge        Co-evaluation               AM-PAC PT "6 Clicks" Mobility  Outcome Measure Help needed turning from your back to your side while in a flat bed without using bedrails?: None Help needed moving from lying on your back to sitting on the side of a flat bed without using bedrails?: None Help needed moving to and from a bed to a chair (including a wheelchair)?: None Help needed standing up from a chair using your arms (e.g., wheelchair or bedside chair)?: A Little Help needed to walk in hospital room?: A Little Help needed climbing 3-5 steps with a railing? : A Little 6 Click Score: 21    End of Session Equipment Utilized During Treatment: Gait belt Activity Tolerance: Patient tolerated treatment well Patient left: in bed;with bed alarm set Nurse Communication: Mobility status PT Visit Diagnosis: Unsteadiness on feet (R26.81);Difficulty in walking, not elsewhere classified (R26.2)    Time: 2725-3664 PT Time Calculation (min) (ACUTE ONLY): 17 min   Charges:   PT Evaluation $PT Eval Low Complexity: 1 Low PT Treatments $Gait Training: 8-22 mins        Greggory Stallion, PT, DPT [phone]   Kambri Dismore 04/01/2019, 12:50 PM

## 2019-04-01 NOTE — Progress Notes (Signed)
Reached out to MD regarding foley that was placed in patient on admission.  Placed due to retention.  At this time, MD would like to keep foley in place.

## 2019-04-01 NOTE — Progress Notes (Addendum)
Progress Note    Marvin Ortega  ZOX:[id number] DOB: 20-Jun-1950  DOA: 03/28/2019 PCP: Kandyce RudBabaoff, [name], MD      Brief Narrative:    Medical records reviewed and are as summarized below:  Marvin Ortega is an 69 y.o. male with medical hx of BPH, kidney stones status post staged lithotripsy last fall c/b C. difficile, HTN, COPD, and remote history of alcohol use disorder now in remission, UGIB, and paroxysmal A. fib not on anticoagulantswho presentedwith urinary retention, malaise, generalized weakness and confusion.  In the ER, afebrile, HR 106, blood pressure 90/60, WBC 27.6K, hemoglobin 8.4, normocytic, and creatinine 3.96, up from baseline 1.1.Lactic acid >11. He was given ceftriaxone, IV fluids, and the hospitalist service were asked to evaluate for renal failure.    Assessment/Plan:   Principal Problem:   Acute renal failure (ARF) (HCC) Active Problems:   Essential (primary) hypertension   AA (alcohol abuse)   PAF (paroxysmal atrial fibrillation) (HCC)   COPD (chronic obstructive pulmonary disease) (HCC)   Acidosis   Normocytic anemia   Body mass index is 27.01 kg/m.    Sepsis secondary to Serratia marcescens UTI: Continue IV Rocephin.  Severe leukocytosis: Slowly improving.  On antibiotics.  Recent SVT/atrial fibrillation with rapid ventricular response: He remains in normal sinus rhythm.  Continue amiodarone and metoprolol.  No anticoagulants for now.  COPD exacerbation: Continue IV steroids and bronchodilators.  Hycodan syrup as needed for cough.  Postobstructive uropathy secondary to BPH/acute renal failure with anion gap metabolic acidosis: Continue Foley catheter.  Creatinine is slowly improving.  Metabolic acidosis has resolved.  Discontinue IV fluids because of concern for fluid overload.  Outpatient follow-up with urologist in 2 weeks.  Urologist recommended discharge with Foley catheter.  Case discussed with Dr. Eugenie FillerStoiof today.  Severe iron  deficiency anemia: Status post transfusion with 1 unit of packed red blood cells.  Outpatient follow-up for further work-up.    Family Communication/Anticipated D/C date and plan/Code Status   DVT prophylaxis: Lovenox Code Status: Full code Family Communication: Plan discussed with patient Disposition Plan: Possible discharge to home tomorrow     Subjective:   No complaints.  He is coughing less and breathing better.  No shortness of breath.  Objective:    Vitals:   03/31/19 2016 04/01/19 0215 04/01/19 0330 04/01/19 0746  BP:   106/76 125/83  Pulse:   83 71  Resp:   18 16  Temp:   98.1 F (36.7 C) 98.1 F (36.7 C)  TempSrc:   Oral Oral  SpO2: 94% 96% 95% 93%  Weight:   75.9 kg   Height:        Intake/Output Summary (Last 24 hours) at 04/01/2019 0839 Last data filed at 04/01/2019 0339 Gross per 24 hour  Intake 2974.37 ml  Output 1125 ml  Net 1849.37 ml   Filed Weights   03/28/19 1344 03/31/19 0600 04/01/19 0330  Weight: 63 kg 73.6 kg 75.9 kg    Exam:  GEN: No acute distress. SKIN: No rash EYES: EOMI ENT: MMM CV: Slow rate and rhythm. PULM: b/l wheezing, b/l rales ABD: soft, non tender CNS: AAO x 3, non focal EXT: No edema or tenderness GU: Foley cath with amber urine  Data Reviewed:   I have personally reviewed following labs and imaging studies:  Labs: Labs show the following:   Basic Metabolic Panel: Recent Labs  Lab 03/29/19 0038 03/29/19 0212 03/29/19 1507 03/29/19 1507 03/30/19 0345 03/30/19 0345 03/30/19 1737 03/30/19  1737 03/31/19 0603 04/01/19 0645  NA 139   < > 141  --  139  --  140  --  139 137  K 3.2*   < > 3.5   < > 3.0*   < > 4.2   < > 4.6 4.5  CL 106   < > 99  --  97*  --  100  --  101 102  CO2 14*   < > 24  --  27  --  27  --  27 26  GLUCOSE 124*   < > 148*  --  149*  --  198*  --  153* 140*  BUN 29*   < > 38*  --  40*  --  40*  --  41* 48*  CREATININE 3.66*   < > 3.35*  --  2.53*  --  2.19*  --  1.65* 1.45*  CALCIUM  7.6*   < > 7.3*  --  7.1*  --  7.8*  --  8.1* 7.7*  MG 1.3*  --  2.2  --  2.1  --   --   --  2.2  --    < > = values in this interval not displayed.   GFR Estimated Creatinine Clearance: 42.8 mL/min (A) (by C-G formula based on SCr of 1.45 mg/dL (H)). Liver Function Tests: No results for input(s): AST, ALT, ALKPHOS, BILITOT, PROT, ALBUMIN in the last 168 hours. No results for input(s): LIPASE, AMYLASE in the last 168 hours. No results for input(s): AMMONIA in the last 168 hours. Coagulation profile No results for input(s): INR, PROTIME in the last 168 hours.  CBC: Recent Labs  Lab 03/28/19 1358 03/28/19 1358 03/29/19 0212 03/29/19 1507 03/30/19 0345 03/31/19 0603 04/01/19 0645  WBC 27.6*  --  26.3*  --  19.1* 15.9* 12.6*  NEUTROABS  --   --   --   --   --  13.9* 10.4*  HGB 8.4*   < > 6.8* 8.9* 8.1* 8.8* 8.3*  HCT 28.1*   < > 22.4* 27.4* 24.3* 27.0* 25.6*  MCV 87.0  --  86.2  --  79.4* 80.6 80.0  PLT 177  --  125*  --  86* 86* 79*   < > = values in this interval not displayed.   Cardiac Enzymes: No results for input(s): CKTOTAL, CKMB, CKMBINDEX, TROPONINI in the last 168 hours. BNP (last 3 results) No results for input(s): PROBNP in the last 8760 hours. CBG: Recent Labs  Lab 03/29/19 0014  GLUCAP 89   D-Dimer: No results for input(s): DDIMER in the last 72 hours. Hgb A1c: No results for input(s): HGBA1C in the last 72 hours. Lipid Profile: No results for input(s): CHOL, HDL, LDLCALC, TRIG, CHOLHDL, LDLDIRECT in the last 72 hours. Thyroid function studies: No results for input(s): TSH, T4TOTAL, T3FREE, THYROIDAB in the last 72 hours.  Invalid input(s): FREET3 Anemia work up: No results for input(s): VITAMINB12, FOLATE, FERRITIN, TIBC, IRON, RETICCTPCT in the last 72 hours. Sepsis Labs: Recent Labs  Lab 03/29/19 0212 03/29/19 0547 03/29/19 2038 03/29/19 2159 03/30/19 0345 03/30/19 0619 03/31/19 0603 04/01/19 0645  WBC 26.3*  --   --   --  19.1*  --  15.9*  12.6*  LATICACIDVEN 9.0*   < > 7.1* 5.9* 4.4* 4.3*  --   --    < > = values in this interval not displayed.    Microbiology Recent Results (from the past 240 hour(s))  Urine C&S  Status: Abnormal   Collection Time: 03/28/19  1:58 PM   Specimen: Urine, Random  Result Value Ref Range Status   Specimen Description   Final    URINE, RANDOM Performed at [facility], [address] [place], [place] [address]    Special Requests   Final    NONE Performed at [facility], [address] [place], [place] [address]    Culture >=100,000 COLONIES/mL SERRATIA MARCESCENS (A)  Final   Report Status 03/30/2019 FINAL  Final   Organism ID, Bacteria SERRATIA MARCESCENS (A)  Final      Susceptibility   Serratia marcescens - MIC*    CEFAZOLIN >=64 RESISTANT Resistant     CEFTRIAXONE <=0.25 SENSITIVE Sensitive     CIPROFLOXACIN <=0.25 SENSITIVE Sensitive     GENTAMICIN <=1 SENSITIVE Sensitive     NITROFURANTOIN 256 RESISTANT Resistant     TRIMETH/SULFA <=20 SENSITIVE Sensitive     * >=100,000 COLONIES/mL SERRATIA MARCESCENS  SARS CORONAVIRUS 2 (TAT 6-24 HRS) Nasopharyngeal Nasopharyngeal Swab     Status: None   Collection Time: 03/28/19  3:46 PM   Specimen: Nasopharyngeal Swab  Result Value Ref Range Status   SARS Coronavirus 2 NEGATIVE NEGATIVE Final    Comment: (NOTE) SARS-CoV-2 target nucleic acids are NOT DETECTED. The SARS-CoV-2 RNA is generally detectable in upper and lower respiratory specimens during the acute phase of infection. Negative results do not preclude SARS-CoV-2 infection, do not rule out co-infections with other pathogens, and should not be used as the sole basis for treatment or other patient management decisions. Negative results must be combined with clinical observations, patient history, and epidemiological information. The expected result is Negative. Fact Sheet for Patients: HairSlick.nohttps://www.fda.gov/media/138098/download Fact Sheet for  Healthcare Providers: quierodirigir.comhttps://www.fda.gov/media/138095/download This test is not yet approved or cleared by the [place] FDA and  has been authorized for detection and/or diagnosis of SARS-CoV-2 by FDA under an Emergency Use Authorization (EUA). This EUA will remain  in effect (meaning this test can be used) for the duration of the COVID-19 declaration under Section 56 4(b)(1) of the Act, 21 U.S.C. section 360bbb-3(b)(1), unless the authorization is terminated or revoked sooner. Performed at [facility], [address] [place], [place] [address]   CULTURE, BLOOD (ROUTINE X 2) w Reflex to ID Panel     Status: None (Preliminary result)   Collection Time: 03/28/19  4:38 PM   Specimen: BLOOD  Result Value Ref Range Status   Specimen Description BLOOD BLOOD LEFT HAND  Final   Special Requests   Final    BOTTLES DRAWN AEROBIC AND ANAEROBIC Blood Culture adequate volume   Culture   Final    NO GROWTH 4 DAYS Performed at [facility], [address] [place], [place] [address]    Report Status PENDING  Incomplete  CULTURE, BLOOD (ROUTINE X 2) w Reflex to ID Panel     Status: None (Preliminary result)   Collection Time: 03/28/19  4:54 PM   Specimen: BLOOD  Result Value Ref Range Status   Specimen Description BLOOD BLOOD RIGHT FOREARM  Final   Special Requests   Final    BOTTLES DRAWN AEROBIC AND ANAEROBIC Blood Culture adequate volume   Culture   Final    NO GROWTH 4 DAYS Performed at [facility], [address] [place], [place] [address]    Report Status PENDING  Incomplete    Procedures and diagnostic studies:  DG Chest Port 1 View  Result Date: 03/30/2019  CLINICAL DATA:  Dyspnea EXAM: PORTABLE CHEST 1 VIEW COMPARISON:  12/31/2018 FINDINGS: Cardiac shadow is at the upper limits of normal in size. Tortuosity of the thoracic aorta is noted somewhat accentuated by patient rotation. The lungs are clear. No focal infiltrate or effusion is noted. No  bony abnormality is seen. IMPRESSION: No acute abnormality noted. Electronically Signed   By: Alcide Clever M.D.   On: 03/30/2019 10:05   ECHOCARDIOGRAM COMPLETE  Result Date: 03/31/2019   ECHOCARDIOGRAM REPORT   Patient Name:   Marvin Ortega Date of Exam: 03/30/2019 Medical Rec #:  [id number]         Height:       66.0 in Accession #:    [id number]        Weight:       139.0 lb Date of Birth:  April 10, 1950         BSA:          1.71 m Patient Age:    68 years          BP:           114/72 mmHg Patient Gender: M                 HR:           124 bpm. Exam Location:  [facility] Procedure: 2D Echo, Cardiac Doppler and Limited Color Doppler Indications:     I47.2 Ventricular Tachycardia  History:         Patient has no prior history of Echocardiogram examinations.                  COPD; Risk Factors:Hypertension.  Sonographer:     Humphrey Rolls RDCS (AE) Referring Phys:  [id number] BRENDA MORRISON Diagnosing Phys: Adrian Blackwater MD  Sonographer Comments: Technically difficult study due to poor echo windows. TDS due to tachycardia, Image acquisition challenging due to COPD and Image acquisition challenging due to respiratory motion. IMPRESSIONS  1. Left ventricular ejection fraction, by visual estimation, is 60 to 65%. The left ventricle has normal function. There is no left ventricular hypertrophy.  2. Left ventricular diastolic parameters are indeterminate.  3. The left ventricle has no regional wall motion abnormalities.  4. Global right ventricle has normal systolic function.The right ventricular size is normal. No increase in right ventricular wall thickness.  5. Left atrial size was mildly dilated.  6. Right atrial size was mildly dilated.  7. The mitral valve is normal in structure. Trivial mitral valve regurgitation. No evidence of mitral stenosis.  8. The tricuspid valve is normal in structure.  9. The tricuspid valve is normal in structure. Tricuspid valve regurgitation is trivial. 10. The aortic valve is normal in  structure. Aortic valve regurgitation is not visualized. No evidence of aortic valve sclerosis or stenosis. 11. The pulmonic valve was normal in structure. Pulmonic valve regurgitation is not visualized. 12. The inferior vena cava is normal in size with greater than 50% respiratory variability, suggesting right atrial pressure of 3 mmHg. FINDINGS  Left Ventricle: Left ventricular ejection fraction, by visual estimation, is 60 to 65%. The left ventricle has normal function. The left ventricle has no regional wall motion abnormalities. There is no left ventricular hypertrophy. Left ventricular diastolic parameters are indeterminate. Normal left atrial pressure. Right Ventricle: The right ventricular size is normal. No increase in right ventricular wall thickness. Global RV systolic function is has normal systolic function. Left Atrium: Left atrial size was mildly dilated. Right Atrium: Right atrial  size was mildly dilated Pericardium: There is no evidence of pericardial effusion. Mitral Valve: The mitral valve is normal in structure. Trivial mitral valve regurgitation. No evidence of mitral valve stenosis by observation. MV peak gradient, 6.7 mmHg. Tricuspid Valve: The tricuspid valve is normal in structure. Tricuspid valve regurgitation is trivial. Aortic Valve: The aortic valve is normal in structure. Aortic valve regurgitation is not visualized. The aortic valve is structurally normal, with no evidence of sclerosis or stenosis. Aortic valve mean gradient measures 4.0 mmHg. Aortic valve peak gradient measures 7.3 mmHg. Aortic valve area, by VTI measures 3.73 cm. Pulmonic Valve: The pulmonic valve was normal in structure. Pulmonic valve regurgitation is not visualized. Pulmonic regurgitation is not visualized. Aorta: The aortic root, ascending aorta and aortic arch are all structurally normal, with no evidence of dilitation or obstruction. Venous: The inferior vena cava is normal in size with greater than 50%  respiratory variability, suggesting right atrial pressure of 3 mmHg. IAS/Shunts: No atrial level shunt detected by color flow Doppler. There is no evidence of a patent foramen ovale. No ventricular septal defect is seen or detected. There is no evidence of an atrial septal defect.  LEFT VENTRICLE PLAX 2D LVIDd:         3.71 cm LVIDs:         2.68 cm LV PW:         0.91 cm LV IVS:        0.83 cm LVOT diam:     2.30 cm LV SV:         32 ml LV SV Index:   18.63 LVOT Area:     4.15 cm  LEFT ATRIUM           Index LA diam:      2.70 cm 1.58 cm/m LA Vol (A4C): 25.1 ml 14.65 ml/m  AORTIC VALVE                   PULMONIC VALVE AV Area (Vmax):    3.32 cm    PV Vmax:       0.79 m/s AV Area (Vmean):   3.19 cm    PV Vmean:      48.600 cm/s AV Area (VTI):     3.73 cm    PV VTI:        0.102 m AV Vmax:           135.00 cm/s PV Peak grad:  2.5 mmHg AV Vmean:          93.700 cm/s PV Mean grad:  1.0 mmHg AV VTI:            0.186 m AV Peak Grad:      7.3 mmHg AV Mean Grad:      4.0 mmHg LVOT Vmax:         108.00 cm/s LVOT Vmean:        71.900 cm/s LVOT VTI:          0.167 m LVOT/AV VTI ratio: 0.90  AORTA Ao Root diam: 3.90 cm MITRAL VALVE MV Area (PHT): 10.99 cm            SHUNTS MV Peak grad:  6.7 mmHg             Systemic VTI:  0.17 m MV Mean grad:  2.0 mmHg             Systemic Diam: 2.30 cm MV Vmax:       1.29 m/s MV Vmean:  60.9 cm/s MV VTI:        0.16 m MV PHT:        20.01 msec MV Decel Time: 69 msec MV E velocity: 113.00 cm/s 103 cm/s  Neoma Laming MD Electronically signed by Neoma Laming MD Signature Date/Time: 03/31/2019/9:22:46 AM    Final     Medications:   . amiodarone  400 mg Oral BID  . Chlorhexidine Gluconate Cloth  6 each Topical Daily  . enoxaparin (LOVENOX) injection  40 mg Subcutaneous Q24H  . finasteride  5 mg Oral Daily  . ipratropium-albuterol  3 mL Nebulization Q6H  . metoprolol tartrate  25 mg Oral BID  . potassium chloride  40 mEq Oral BID  . [START ON 04/02/2019] predniSONE  40 mg Oral  Q breakfast   Continuous Infusions: . cefTRIAXone (ROCEPHIN)  IV Stopped (03/31/19 1352)  . diltiazem (CARDIZEM) infusion Stopped (03/31/19 1016)     LOS: 4 days   Makail Watling  [facility]   *Please refer to [email or web address], password [email or web address] to get updated schedule on who will round on this patient, as hospitalists switch teams weekly. If 7PM-7AM, please contact night-coverage at [email or web address], password [email or web address] for any overnight needs.  04/01/2019, 8:39 AM

## 2019-04-01 NOTE — TOC Initial Note (Signed)
Transition of Care [facility]) - Initial/Assessment Note    Patient Details  Name: Marvin Ortega MRN: [id number] Date of Birth: 1951-02-19  Transition of Care [facility]) CM/SW Contact:    Trenton Founds, RN Phone Number: 04/01/2019, 4:03 PM  Clinical Narrative:     RNCM placed call to patient for assessment. Patient reports he now lives at [facility] and has lived there for a couple of months. Patient reports that he still drives and has all necessary equipment at home. Patient reports that he does not feel he needs any therapy at this time but if he changes his mind he will let this CM know. RNCM will continue to follow.                     Patient Goals and CMS Choice        Expected Discharge Plan and Services                                                Prior Living Arrangements/Services                       Activities of Daily Living Home Assistive Devices/Equipment: None ADL Screening (condition at time of admission) Patient's cognitive ability adequate to safely complete daily activities?: Yes Is the patient deaf or have difficulty hearing?: No Does the patient have difficulty seeing, even when wearing glasses/contacts?: No Does the patient have difficulty concentrating, remembering, or making decisions?: No Patient able to express need for assistance with ADLs?: Yes Does the patient have difficulty dressing or bathing?: No Independently performs ADLs?: Yes (appropriate for developmental age) Does the patient have difficulty walking or climbing stairs?: No Weakness of Legs: None Weakness of Arms/Hands: None  Permission Sought/Granted                  Emotional Assessment              Admission diagnosis:  Acute prostatitis [N41.0] Urinary retention [R33.9] Nephrolithiasis [N20.0] Acute renal failure (ARF) (HCC) [N17.9] Patient Active Problem List   Diagnosis Date Noted  . Acute renal failure (ARF) (HCC) 03/28/2019  . COPD  (chronic obstructive pulmonary disease) (HCC) 03/28/2019  . Acidosis 03/28/2019  . Normocytic anemia 03/28/2019  . Bladder calculus 11/30/2018  . C. difficile colitis 11/25/2018  . Renal calculus 11/23/2018  . Ureteral calculi 10/29/2018  . Calculus of bladder 10/29/2018  . Nephrolithiasis 10/29/2018  . Gross hematuria 09/28/2018  . PAF (paroxysmal atrial fibrillation) (HCC) 02/25/2018  . Benign prostatic hyperplasia with lower urinary tract symptoms 12/08/2013  . Essential (primary) hypertension 12/08/2013  . AA (alcohol abuse) 12/08/2013  . GERD (gastroesophageal reflux disease) 02/13/2011  . Hyperlipidemia 06/20/2010   PCP:  Kandyce Rud, MD Pharmacy:   [facility] [address] [place] - [address] [place] [address] Phone: [phone] Fax: [phone]     Social Determinants of Health (SDOH) Interventions    Readmission Risk Interventions Readmission Risk Prevention Plan 04/01/2019  Transportation Screening Complete  PCP or Specialist Appt within 3-5 Days Complete  HRI or Home Care Consult Patient refused  Palliative Care Screening Not Applicable  Medication Review (RN Care Manager) Complete  Some recent data might be hidden

## 2019-04-02 DIAGNOSIS — I4891 Unspecified atrial fibrillation: Secondary | ICD-10-CM

## 2019-04-02 LAB — BASIC METABOLIC PANEL
Anion gap: 7 (ref 5–15)
BUN: 49 mg/dL — ABNORMAL HIGH (ref 8–23)
CO2: 25 mmol/L (ref 22–32)
Calcium: 7.4 mg/dL — ABNORMAL LOW (ref 8.9–10.3)
Chloride: 106 mmol/L (ref 98–111)
Creatinine, Ser: 1.35 mg/dL — ABNORMAL HIGH (ref 0.61–1.24)
GFR calc Af Amer: >60 mL/min (ref >60)
GFR calc non Af Amer: 53 mL/min — ABNORMAL LOW (ref >60)
Glucose, Bld: 138 mg/dL — ABNORMAL HIGH (ref 70–99)
Potassium: 5.1 mmol/L (ref 3.5–5.1)
Sodium: 138 mmol/L (ref 135–145)

## 2019-04-02 LAB — CULTURE, BLOOD (ROUTINE X 2)
Culture: NO GROWTH
Culture: NO GROWTH
Special Requests: ADEQUATE
Special Requests: ADEQUATE

## 2019-04-02 MED ORDER — PREDNISONE 20 MG PO TABS: 40.0000 mg | ORAL_TABLET | Freq: Every day | ORAL | 0 refills | Status: AC

## 2019-04-02 MED ORDER — METOPROLOL TARTRATE 25 MG PO TABS: 25.0000 mg | ORAL_TABLET | Freq: Two times a day (BID) | ORAL | 0 refills | Status: DC

## 2019-04-02 MED ORDER — CEFDINIR 300 MG PO CAPS: 300.0000 mg | ORAL_CAPSULE | Freq: Two times a day (BID) | ORAL | 0 refills | Status: DC

## 2019-04-02 MED ORDER — AMIODARONE HCL 200 MG PO TABS: 200.0000 mg | ORAL_TABLET | Freq: Two times a day (BID) | ORAL | 0 refills | Status: DC

## 2019-04-02 NOTE — Social Work (Addendum)
TOC CM/SW   1715 Patient going home w/[facility].  [facility] services will provide [facility] services Monday morning.    Larwance Rote, MSW, LCSW  [phone] 8am-5pm

## 2019-04-02 NOTE — Progress Notes (Signed)
Progress Note  Patient Name: Marvin Ortega Date of Encounter: 04/02/2019  Primary Cardiologist:   No primary care provider on file.   Subjective   He says that he feels OK but gets SOB when he eats.  Denies chest pain or palpitations.   Inpatient Medications    Scheduled Meds: . amiodarone  400 mg Oral BID  . Chlorhexidine Gluconate Cloth  6 each Topical Daily  . finasteride  5 mg Oral Daily  . loratadine  10 mg Oral Daily  . metoprolol tartrate  25 mg Oral BID  . potassium chloride  40 mEq Oral BID  . predniSONE  40 mg Oral Q breakfast   Continuous Infusions: . sodium chloride 75 mL/hr (04/01/19 1411)  . cefTRIAXone (ROCEPHIN)  IV 1 g (04/01/19 1416)  . diltiazem (CARDIZEM) infusion Stopped (03/31/19 1016)   PRN Meds: sodium chloride, acetaminophen **OR** acetaminophen, guaiFENesin-dextromethorphan, HYDROcodone-homatropine, ipratropium-albuterol, ondansetron **OR** ondansetron (ZOFRAN) IV   Vital Signs    Vitals:   04/01/19 1601 04/01/19 1907 04/02/19 0400 04/02/19 0722  BP: 113/82 122/88 119/86 125/85  Pulse: 79 83 67 63  Resp: 20 20 20 20   Temp: 98.2 F (36.8 C) 98.5 F (36.9 C) 98.2 F (36.8 C) 98.1 F (36.7 C)  TempSrc: Oral Oral Oral Oral  SpO2: 100% 99% 100% 98%  Weight:      Height:        Intake/Output Summary (Last 24 hours) at 04/02/2019 1011 Last data filed at 04/02/2019 0400 Gross per 24 hour  Intake 480 ml  Output 800 ml  Net -320 ml   Filed Weights   03/28/19 1344 03/31/19 0600 04/01/19 0330  Weight: 63 kg 73.6 kg 75.9 kg    Telemetry    NSR - Personally Reviewed  ECG    NA - Personally Reviewed  Physical Exam   GEN: No acute distress.   Neck: No  JVD Cardiac: RRR, no murmurs, rubs, or gallops.  Respiratory:   Decreased breath sounds with diffuse wheezing GI: Soft, nontender, non-distended  MS: No  edema; No deformity. Neuro:  Nonfocal  Psych: Normal affect   Labs    Chemistry Recent Labs  Lab 03/31/19 0603  04/01/19 0645 04/02/19 0540  NA 139 137 138  K 4.6 4.5 5.1  CL 101 102 106  CO2 27 26 25   GLUCOSE 153* 140* 138*  BUN 41* 48* 49*  CREATININE 1.65* 1.45* 1.35*  CALCIUM 8.1* 7.7* 7.4*  GFRNONAA 41* 48* 53*  GFRAA 48* 56* >60  ANIONGAP 11 9 7      Hematology Recent Labs  Lab 03/30/19 0345 03/31/19 0603 04/01/19 0645  WBC 19.1* 15.9* 12.6*  RBC 3.06* 3.35* 3.20*  HGB 8.1* 8.8* 8.3*  HCT 24.3* 27.0* 25.6*  MCV 79.4* 80.6 80.0  MCH 26.5 26.3 25.9*  MCHC 33.3 32.6 32.4  RDW 18.3* 18.4* 18.6*  PLT 86* 86* 79*    Cardiac EnzymesNo results for input(s): TROPONINI in the last 168 hours. No results for input(s): TROPIPOC in the last 168 hours.   BNP Recent Labs  Lab 03/30/19 0345  BNP 283.0*     DDimer No results for input(s): DDIMER in the last 168 hours.   Radiology    No results found.  Cardiac Studies   Echo 03/30/19   1. Left ventricular ejection fraction, by visual estimation, is 60 to 65%. The left ventricle has normal function. There is no left ventricular hypertrophy.  2. Left ventricular diastolic parameters are indeterminate.  3. The  left ventricle has no regional wall motion abnormalities.  4. Global right ventricle has normal systolic function.The right ventricular size is normal. No increase in right ventricular wall thickness.  5. Left atrial size was mildly dilated.  6. Right atrial size was mildly dilated.  7. The mitral valve is normal in structure. Trivial mitral valve regurgitation. No evidence of mitral stenosis.  8. The tricuspid valve is normal in structure.  9. The tricuspid valve is normal in structure. Tricuspid valve regurgitation is trivial. 10. The aortic valve is normal in structure. Aortic valve regurgitation is not visualized. No evidence of aortic valve sclerosis or stenosis. 11. The pulmonic valve was normal in structure. Pulmonic valve regurgitation is not visualized. 12. The inferior vena cava is normal in size with greater than  50% respiratory variability, suggesting right atrial pressure of 3 mmHg.  Patient Profile     69 y.o. male with a hx of BPH, paroxysmal atrial fibrillation, kidney stones with stage lithotripsy fall 2020, C. difficile, COPD, remote history alcohol, upper GI bleed, presenting with confusion, weakness, malaise, urinary retention.  He had atrial fibrillation with RVR  Assessment & Plan    Atrial fibrillation with RVR  :  Converted to NSR spontaneously.  Not a good candidate for anticoagulation.  Started on amiodarone.  We will follow and leave follow up with our clinic and plans to titrate this dose down at discharge.  Beta blocker inreased yesterday.  No change in therapy today.   Urosepsis :  Management per primary team.   Acute bronchitis:  Improved but not back to baseline.  Says that he gets SOB with eating.  Consider swallow eval if not previously done.  I will defer to the primary team.   Acute on chronic renal failure:   Creat is improved.  Follow.     For questions or updates, please contact [facility] Please consult [email or web address] for contact info under Cardiology/STEMI.   Signed, Rollene Rotunda, MD  04/02/2019, 10:11 AM

## 2019-04-02 NOTE — Plan of Care (Signed)
  Problem: Clinical Measurements: Goal: Ability to maintain clinical measurements within normal limits will improve Outcome: Progressing Goal: Cardiovascular complication will be avoided Outcome: Progressing   Problem: Activity: Goal: Risk for activity intolerance will decrease Outcome: Progressing   Problem: Safety: Goal: Ability to remain free from injury will improve Outcome: Progressing   Problem: Fluid Volume: Goal: Hemodynamic stability will improve Outcome: Progressing   Problem: Clinical Measurements: Goal: Diagnostic test results will improve Outcome: Progressing Goal: Signs and symptoms of infection will decrease Outcome: Progressing   Problem: Respiratory: Goal: Ability to maintain adequate ventilation will improve Outcome: Progressing

## 2019-04-02 NOTE — Discharge Summary (Addendum)
Physician Discharge Summary  Marvin Ortega TIW:[id number] DOB: Jul 31, 1950 DOA: 03/28/2019  PCP: Kandyce Rud, MD  Admit date: 03/28/2019 Discharge date: 04/02/2019  Discharge disposition: Home with healthcare   Recommendations for Outpatient Follow-Up:    Follow-up with PCP as scheduled Follow-up with urologist as scheduled Follow-up with cardiologist as scheduled  Discharge Diagnosis:   Principal Problem:   Acute renal failure (ARF) (HCC) Active Problems:   Essential (primary) hypertension   AA (alcohol abuse)   PAF (paroxysmal atrial fibrillation) (HCC)   COPD (chronic obstructive pulmonary disease) (HCC)   Acidosis   Normocytic anemia    Discharge Condition: Stable.  Diet recommendation: low salt diet  Code status: Full code.    Hospital Course:   Marvin Ortega is an 69 y.o. male with medical hx of BPH, kidney stones status post staged lithotripsy last fall c/b C. difficile, HTN, COPD, and remote history of alcohol use disorder now in remission, UGIB, and paroxysmal A. fib not on anticoagulantswho presentedwith urinary retention, malaise, generalized weakness and confusion.  In the ER, afebrile, HR 106, blood pressure 90/60, WBC 27.6K, hemoglobin 8.4, normocytic, and creatinine 3.96, up from baseline 1.1.Lactic acid >11. He was given ceftriaxone, IV fluids, and the hospitalist service were asked to evaluate for renal failure.  He was admitted to the hospital for sepsis secondary to UTI.  Urine culture revealed Serratia marcescens.  He was treated with IV Rocephin and transitioned to [facility] at discharge.  He also had acute renal failure with anion gap metabolic acidosis secondary to postobstructive uropathy.  Foley catheter was placed for urinary retention and he was treated with IV fluids.  Creatinine slowly improved.  Hospital course was complicated by SVT and atrial fibrillation with rapid ventricular response requiring IV Cardizem infusion.   He was seen in consultation by the cardiologist who recommended that patient be discharged on amiodarone and metoprolol.  He was deemed not to be a good candidate for anticoagulation because of severe anemia.  It is worth noting that patient required transfusion with 1 unit of packed red blood cells for anemia.  He also developed COPD exacerbation in the hospital requiring treatment with IV steroids, bronchodilators and antitussives.  His condition has improved and he feels stable for discharge to home.  He was evaluated by PT who recommended home health PT.       Discharge Exam:   Vitals:   04/02/19 0722 04/02/19 1527  BP: 125/85 125/88  Pulse: 63 68  Resp: 20 20  Temp: 98.1 F (36.7 C) 98.9 F (37.2 C)  SpO2: 98% 99%   Vitals:   04/01/19 1907 04/02/19 0400 04/02/19 0722 04/02/19 1527  BP: 122/88 119/86 125/85 125/88  Pulse: 83 67 63 68  Resp: 20 20 20 20   Temp: 98.5 F (36.9 C) 98.2 F (36.8 C) 98.1 F (36.7 C) 98.9 F (37.2 C)  TempSrc: Oral Oral Oral Oral  SpO2: 99% 100% 98% 99%  Weight:      Height:         GEN: NAD SKIN: No rash EYES: EOMI ENT: MMM CV: RRR PULM: CTA B ABD: soft, ND, NT, +BS CNS: AAO x 3, non focal EXT: No edema or tenderness GU: Foley catheter draining amber urine   The results of significant diagnostics from this hospitalization (including imaging, microbiology, ancillary and laboratory) are listed below for reference.     Procedures and Diagnostic Studies:   DG Chest Port 1 View  Result Date: 03/30/2019 CLINICAL DATA:  Dyspnea  EXAM: PORTABLE CHEST 1 VIEW COMPARISON:  12/31/2018 FINDINGS: Cardiac shadow is at the upper limits of normal in size. Tortuosity of the thoracic aorta is noted somewhat accentuated by patient rotation. The lungs are clear. No focal infiltrate or effusion is noted. No bony abnormality is seen. IMPRESSION: No acute abnormality noted. Electronically Signed   By: Alcide CleverMark  Lukens M.D.   On: 03/30/2019 10:05    ECHOCARDIOGRAM COMPLETE  Result Date: 03/31/2019   ECHOCARDIOGRAM REPORT   Patient Name:   Marvin BongoWALLACE E Ortega Date of Exam: 03/30/2019 Medical Rec #:  [id number]         Height:       66.0 in Accession #:    [id number]        Weight:       139.0 lb Date of Birth:  05-18-1950         BSA:          1.71 m Patient Age:    68 years          BP:           114/72 mmHg Patient Gender: M                 HR:           124 bpm. Exam Location:  [facility] Procedure: 2D Echo, Cardiac Doppler and Limited Color Doppler Indications:     I47.2 Ventricular Tachycardia  History:         Patient has no prior history of Echocardiogram examinations.                  COPD; Risk Factors:Hypertension.  Sonographer:     Humphrey RollsJoan Heiss RDCS (AE) Referring Phys:  [id number] Marvin Ortega Diagnosing Phys: Adrian BlackwaterShaukat Khan MD  Sonographer Comments: Technically difficult study due to poor echo windows. TDS due to tachycardia, Image acquisition challenging due to COPD and Image acquisition challenging due to respiratory motion. IMPRESSIONS  1. Left ventricular ejection fraction, by visual estimation, is 60 to 65%. The left ventricle has normal function. There is no left ventricular hypertrophy.  2. Left ventricular diastolic parameters are indeterminate.  3. The left ventricle has no regional wall motion abnormalities.  4. Global right ventricle has normal systolic function.The right ventricular size is normal. No increase in right ventricular wall thickness.  5. Left atrial size was mildly dilated.  6. Right atrial size was mildly dilated.  7. The mitral valve is normal in structure. Trivial mitral valve regurgitation. No evidence of mitral stenosis.  8. The tricuspid valve is normal in structure.  9. The tricuspid valve is normal in structure. Tricuspid valve regurgitation is trivial. 10. The aortic valve is normal in structure. Aortic valve regurgitation is not visualized. No evidence of aortic valve sclerosis or stenosis. 11. The pulmonic valve was  normal in structure. Pulmonic valve regurgitation is not visualized. 12. The inferior vena cava is normal in size with greater than 50% respiratory variability, suggesting right atrial pressure of 3 mmHg. FINDINGS  Left Ventricle: Left ventricular ejection fraction, by visual estimation, is 60 to 65%. The left ventricle has normal function. The left ventricle has no regional wall motion abnormalities. There is no left ventricular hypertrophy. Left ventricular diastolic parameters are indeterminate. Normal left atrial pressure. Right Ventricle: The right ventricular size is normal. No increase in right ventricular wall thickness. Global RV systolic function is has normal systolic function. Left Atrium: Left atrial size was mildly dilated. Right Atrium: Right atrial size was mildly dilated  Pericardium: There is no evidence of pericardial effusion. Mitral Valve: The mitral valve is normal in structure. Trivial mitral valve regurgitation. No evidence of mitral valve stenosis by observation. MV peak gradient, 6.7 mmHg. Tricuspid Valve: The tricuspid valve is normal in structure. Tricuspid valve regurgitation is trivial. Aortic Valve: The aortic valve is normal in structure. Aortic valve regurgitation is not visualized. The aortic valve is structurally normal, with no evidence of sclerosis or stenosis. Aortic valve mean gradient measures 4.0 mmHg. Aortic valve peak gradient measures 7.3 mmHg. Aortic valve area, by VTI measures 3.73 cm. Pulmonic Valve: The pulmonic valve was normal in structure. Pulmonic valve regurgitation is not visualized. Pulmonic regurgitation is not visualized. Aorta: The aortic root, ascending aorta and aortic arch are all structurally normal, with no evidence of dilitation or obstruction. Venous: The inferior vena cava is normal in size with greater than 50% respiratory variability, suggesting right atrial pressure of 3 mmHg. IAS/Shunts: No atrial level shunt detected by color flow Doppler. There  is no evidence of a patent foramen ovale. No ventricular septal defect is seen or detected. There is no evidence of an atrial septal defect.  LEFT VENTRICLE PLAX 2D LVIDd:         3.71 cm LVIDs:         2.68 cm LV PW:         0.91 cm LV IVS:        0.83 cm LVOT diam:     2.30 cm LV SV:         32 ml LV SV Index:   18.63 LVOT Area:     4.15 cm  LEFT ATRIUM           Index LA diam:      2.70 cm 1.58 cm/m LA Vol (A4C): 25.1 ml 14.65 ml/m  AORTIC VALVE                   PULMONIC VALVE AV Area (Vmax):    3.32 cm    PV Vmax:       0.79 m/s AV Area (Vmean):   3.19 cm    PV Vmean:      48.600 cm/s AV Area (VTI):     3.73 cm    PV VTI:        0.102 m AV Vmax:           135.00 cm/s PV Peak grad:  2.5 mmHg AV Vmean:          93.700 cm/s PV Mean grad:  1.0 mmHg AV VTI:            0.186 m AV Peak Grad:      7.3 mmHg AV Mean Grad:      4.0 mmHg LVOT Vmax:         108.00 cm/s LVOT Vmean:        71.900 cm/s LVOT VTI:          0.167 m LVOT/AV VTI ratio: 0.90  AORTA Ao Root diam: 3.90 cm MITRAL VALVE MV Area (PHT): 10.99 cm            SHUNTS MV Peak grad:  6.7 mmHg             Systemic VTI:  0.17 m MV Mean grad:  2.0 mmHg             Systemic Diam: 2.30 cm MV Vmax:       1.29 m/s MV Vmean:  60.9 cm/s MV VTI:        0.16 m MV PHT:        20.01 msec MV Decel Time: 69 msec MV E velocity: 113.00 cm/s 103 cm/s  Adrian Blackwater MD Electronically signed by Adrian Blackwater MD Signature Date/Time: 03/31/2019/9:22:46 AM    Final      Labs:   Basic Metabolic Panel: Recent Labs  Lab 03/29/19 0038 03/29/19 7782 03/29/19 1507 03/29/19 1507 03/30/19 0345 03/30/19 0345 03/30/19 1737 03/30/19 1737 03/31/19 0603 03/31/19 0603 04/01/19 0645 04/02/19 0540  NA 139   < > 141   < > 139  --  140  --  139  --  137 138  K 3.2*   < > 3.5   < > 3.0*   < > 4.2   < > 4.6   < > 4.5 5.1  CL 106   < > 99   < > 97*  --  100  --  101  --  102 106  CO2 14*   < > 24   < > 27  --  27  --  27  --  26 25  GLUCOSE 124*   < > 148*   < > 149*  --   198*  --  153*  --  140* 138*  BUN 29*   < > 38*   < > 40*  --  40*  --  41*  --  48* 49*  CREATININE 3.66*   < > 3.35*   < > 2.53*  --  2.19*  --  1.65*  --  1.45* 1.35*  CALCIUM 7.6*   < > 7.3*   < > 7.1*  --  7.8*  --  8.1*  --  7.7* 7.4*  MG 1.3*  --  2.2  --  2.1  --   --   --  2.2  --   --   --    < > = values in this interval not displayed.   GFR Estimated Creatinine Clearance: 45.9 mL/min (A) (by C-G formula based on SCr of 1.35 mg/dL (H)). Liver Function Tests: No results for input(s): AST, ALT, ALKPHOS, BILITOT, PROT, ALBUMIN in the last 168 hours. No results for input(s): LIPASE, AMYLASE in the last 168 hours. No results for input(s): AMMONIA in the last 168 hours. Coagulation profile No results for input(s): INR, PROTIME in the last 168 hours.  CBC: Recent Labs  Lab 03/28/19 1358 03/28/19 1358 03/29/19 0212 03/29/19 1507 03/30/19 0345 03/31/19 0603 04/01/19 0645  WBC 27.6*  --  26.3*  --  19.1* 15.9* 12.6*  NEUTROABS  --   --   --   --   --  13.9* 10.4*  HGB 8.4*   < > 6.8* 8.9* 8.1* 8.8* 8.3*  HCT 28.1*   < > 22.4* 27.4* 24.3* 27.0* 25.6*  MCV 87.0  --  86.2  --  79.4* 80.6 80.0  PLT 177  --  125*  --  86* 86* 79*   < > = values in this interval not displayed.   Cardiac Enzymes: No results for input(s): CKTOTAL, CKMB, CKMBINDEX, TROPONINI in the last 168 hours. BNP: Invalid input(s): POCBNP CBG: Recent Labs  Lab 03/29/19 0014  GLUCAP 89   D-Dimer No results for input(s): DDIMER in the last 72 hours. Hgb A1c No results for input(s): HGBA1C in the last 72 hours. Lipid Profile No results for input(s): CHOL, HDL, LDLCALC, TRIG, CHOLHDL, LDLDIRECT in the  last 72 hours. Thyroid function studies No results for input(s): TSH, T4TOTAL, T3FREE, THYROIDAB in the last 72 hours.  Invalid input(s): FREET3 Anemia work up No results for input(s): VITAMINB12, FOLATE, FERRITIN, TIBC, IRON, RETICCTPCT in the last 72 hours. Microbiology Recent Results (from the past  240 hour(s))  Urine C&S     Status: Abnormal   Collection Time: 03/28/19  1:58 PM   Specimen: Urine, Random  Result Value Ref Range Status   Specimen Description   Final    URINE, RANDOM Performed at [facility], [address] [place], [place] [address]    Special Requests   Final    NONE Performed at [facility], [address] [place], [place] [address]    Culture >=100,000 COLONIES/mL SERRATIA MARCESCENS (A)  Final   Report Status 03/30/2019 FINAL  Final   Organism ID, Bacteria SERRATIA MARCESCENS (A)  Final      Susceptibility   Serratia marcescens - MIC*    CEFAZOLIN >=64 RESISTANT Resistant     CEFTRIAXONE <=0.25 SENSITIVE Sensitive     CIPROFLOXACIN <=0.25 SENSITIVE Sensitive     GENTAMICIN <=1 SENSITIVE Sensitive     NITROFURANTOIN 256 RESISTANT Resistant     TRIMETH/SULFA <=20 SENSITIVE Sensitive     * >=100,000 COLONIES/mL SERRATIA MARCESCENS  SARS CORONAVIRUS 2 (TAT 6-24 HRS) Nasopharyngeal Nasopharyngeal Swab     Status: None   Collection Time: 03/28/19  3:46 PM   Specimen: Nasopharyngeal Swab  Result Value Ref Range Status   SARS Coronavirus 2 NEGATIVE NEGATIVE Final    Comment: (NOTE) SARS-CoV-2 target nucleic acids are NOT DETECTED. The SARS-CoV-2 RNA is generally detectable in upper and lower respiratory specimens during the acute phase of infection. Negative results do not preclude SARS-CoV-2 infection, do not rule out co-infections with other pathogens, and should not be used as the sole basis for treatment or other patient management decisions. Negative results must be combined with clinical observations, patient history, and epidemiological information. The expected result is Negative. Fact Sheet for Patients: HairSlick.nohttps://www.fda.gov/media/138098/download Fact Sheet for Healthcare Providers: quierodirigir.comhttps://www.fda.gov/media/138095/download This test is not yet approved or cleared by the [place] FDA and  has been authorized for  detection and/or diagnosis of SARS-CoV-2 by FDA under an Emergency Use Authorization (EUA). This EUA will remain  in effect (meaning this test can be used) for the duration of the COVID-19 declaration under Section 56 4(b)(1) of the Act, 21 U.S.C. section 360bbb-3(b)(1), unless the authorization is terminated or revoked sooner. Performed at [facility], [address] [place], [place] [address]   CULTURE, BLOOD (ROUTINE X 2) w Reflex to ID Panel     Status: None   Collection Time: 03/28/19  4:38 PM   Specimen: BLOOD  Result Value Ref Range Status   Specimen Description BLOOD BLOOD LEFT HAND  Final   Special Requests   Final    BOTTLES DRAWN AEROBIC AND ANAEROBIC Blood Culture adequate volume   Culture   Final    NO GROWTH 5 DAYS Performed at [facility], [address] [place], [place] [address]    Report Status 04/02/2019 FINAL  Final  CULTURE, BLOOD (ROUTINE X 2) w Reflex to ID Panel     Status: None   Collection Time: 03/28/19  4:54 PM   Specimen: BLOOD  Result Value Ref Range Status   Specimen Description BLOOD BLOOD RIGHT FOREARM  Final   Special Requests   Final    BOTTLES DRAWN AEROBIC AND ANAEROBIC  Blood Culture adequate volume   Culture   Final    NO GROWTH 5 DAYS Performed at [facility], [address] [place], [place] [address]    Report Status 04/02/2019 FINAL  Final     Discharge Instructions:   Discharge Instructions    Diet - low sodium heart healthy   Complete by: As directed    Face-to-face encounter (required for Medicare/Medicaid patients)   Complete by: As directed    I Jaliana Medellin certify that this patient is under my care and that I, or a nurse practitioner or physician's assistant working with me, had a face-to-face encounter that meets the physician face-to-face encounter requirements with this patient on 04/02/2019. The encounter with the patient was in whole, or in part for the following medical condition(s) which  is the primary reason for home health care (List medical condition): Debility   The encounter with the patient was in whole, or in part, for the following medical condition, which is the primary reason for home health care: Debility   I certify that, based on my findings, the following services are medically necessary home health services: Physical therapy   Reason for Medically Necessary Home Health Services: Therapy- Personnel officer, Public librarian   My clinical findings support the need for the above services: Shortness of breath with activity   Further, I certify that my clinical findings support that this patient is homebound due to: Shortness of Breath with activity   Home Health   Complete by: As directed    To provide the following care/treatments: PT   Increase activity slowly   Complete by: As directed      Allergies as of 04/02/2019   No Known Allergies     Medication List    STOP taking these medications   metoprolol succinate 50 MG 24 hr tablet Commonly known as: TOPROL-XL     TAKE these medications   amiodarone 200 MG tablet Commonly known as: PACERONE Take 1 tablet (200 mg total) by mouth 2 (two) times daily.   cefdinir 300 MG capsule Commonly known as: OMNICEF Take 1 capsule (300 mg total) by mouth 2 (two) times daily.   Combivent Respimat 20-100 MCG/ACT Aers respimat Generic drug: Ipratropium-Albuterol Inhale 2 puffs into the lungs 4 (four) times daily as needed for shortness of breath.   finasteride 5 MG tablet Commonly known as: PROSCAR Take 5 mg by mouth daily.   metoprolol tartrate 25 MG tablet Commonly known as: LOPRESSOR Take 1 tablet (25 mg total) by mouth 2 (two) times daily.   pantoprazole 40 MG tablet Commonly known as: PROTONIX Take 1 tablet (40 mg total) by mouth 2 (two) times daily.   predniSONE 20 MG tablet Commonly known as: DELTASONE Take 2 tablets (40 mg total) by mouth daily with breakfast for 2 days. Start  taking on: April 03, 2019   vitamin B-1 250 MG tablet Take 250 mg by mouth daily.      Follow-up Information    Derinda Late, MD. Schedule an appointment as soon as possible for a visit in 7 day(s).   Specialty: Family Medicine Why: Please schedule appt for 5-7 days after discharge.  Contact information: [address] [facility] [place] [address] [phone]            Time coordinating discharge: 34 minutes  Signed:  Jennye Boroughs  [facility] 04/02/2019, 4:16 PM

## 2019-04-02 NOTE — Progress Notes (Signed)
Patient discharged to home.  Tele and IV d/c'd prior to discharge.  Per MD, patient to go home with foley catheter.  Spoke to case management and plan will be for nursing to come to patient home Monday for foley care.  This RN spoke to patient and he states that this isn't the first time he has gone home with foley.  Re-educated patient on how to clean and care for foley.  Leg bag put on catheter.  Patient declined using a larger bag for nights.  States that he prefers leg bag.  Patient also given wheeled walker by case management prior to discharge.

## 2019-04-05 ENCOUNTER — Ambulatory Visit: Payer: Medicare HMO | Admitting: Physician Assistant

## 2019-04-05 ENCOUNTER — Other Ambulatory Visit: Payer: Self-pay

## 2019-04-05 ENCOUNTER — Ambulatory Visit (INDEPENDENT_AMBULATORY_CARE_PROVIDER_SITE_OTHER): Payer: Medicare HMO | Admitting: Physician Assistant

## 2019-04-05 VITALS — BP 128/85 | HR 79 | Ht 66.0 in | Wt 159.0 lb

## 2019-04-05 DIAGNOSIS — R338 Other retention of urine: Secondary | ICD-10-CM

## 2019-04-05 LAB — BLADDER SCAN AMB NON-IMAGING: Scan Result: 516

## 2019-04-05 NOTE — Progress Notes (Signed)
Fill and Pull Catheter Removal  Patient is present today for a catheter removal.  Patient was cleaned and prepped in a sterile fashion of sterile saline was instilled into the bladder when the patient felt the urge to urinate. 82ml of water was then drained from the balloon.  A 14FR foley cath was removed from the bladder no complications were noted .  Patient was then given some time to void on their own.  Patient cannot void on their own after some time.  Patient tolerated well.  Performed by: Carman Ching, PA-C   Follow up/ Additional notes: Push fluids and RTC this afternoon for PVR.

## 2019-04-05 NOTE — Progress Notes (Signed)
Patient returned to the office this afternoon for voiding trial follow-up. PVR >532mL; voiding trial failed. I offered him replacement of Foley catheter vs. CIC teaching today. He elected for replacement of Foley catheter. Procedure note below.  Results for orders placed or performed in visit on 04/05/19  BLADDER SCAN AMB NON-IMAGING  Result Value Ref Range   Scan Result 516     Simple Catheter Placement  Due to urinary retention patient is present today for a foley cath placement.  Foreskin was retracted and patient was cleaned and prepped in a sterile fashion with betadine. A 16 FR coude foley catheter was inserted, urine return was noted  , urine was yellow in color.  The balloon was filled with 10cc of sterile water.  The foreskin was reduced and a leg bag was attached for drainage and secured to his anterior left thigh with a StatLock. Patient declined a night bag.  Patient was given instruction on proper catheter care.  Patient tolerated well, no complications were noted   Performed by: Carman Ching, PA-C   Additional notes/ Follow up: Scheduled follow-up with Dr. Lonna Cobb on 04/14/2019 to discuss bladder outlet procedures.

## 2019-04-06 ENCOUNTER — Ambulatory Visit: Payer: Medicare HMO | Admitting: Urology

## 2019-04-14 ENCOUNTER — Other Ambulatory Visit: Payer: Self-pay

## 2019-04-14 ENCOUNTER — Encounter: Payer: Self-pay | Admitting: Urology

## 2019-04-14 ENCOUNTER — Ambulatory Visit (INDEPENDENT_AMBULATORY_CARE_PROVIDER_SITE_OTHER): Payer: Medicare HMO | Admitting: Urology

## 2019-04-14 VITALS — BP 138/86 | HR 78 | Ht 66.0 in | Wt 148.0 lb

## 2019-04-14 DIAGNOSIS — N401 Enlarged prostate with lower urinary tract symptoms: Secondary | ICD-10-CM

## 2019-04-14 DIAGNOSIS — R338 Other retention of urine: Secondary | ICD-10-CM

## 2019-04-14 LAB — BLADDER SCAN AMB NON-IMAGING: SCA Result: 262

## 2019-04-14 NOTE — Progress Notes (Signed)
04/14/2019 9:29 AM   Marvin Ortega Jul 01, 1950 [id number]  Referring provider: Kandyce Rud, MD [phone] [address] [facility] [place],  [place] [address]  Chief Complaint  Patient presents with  . Follow-up    HPI: 69 yo male presents for follow-up of urinary retention.  He underwent staged cystolitholapaxy for a large bladder calculus in addition to ureteroscopy September 2020.  He declined an outlet procedure at the time of his cystolitholapaxy.  He states he was voiding without problems after procedure however PVR was approximately 200 mL. He had worsening voiding symptoms mid January 2021 and was admitted to [facility] with confusion and malaise.  He was treated for sepsis secondary to UTI.  Urine culture grew Serratia.  Foley catheter was placed for urinary retention.  He underwent a voiding trial on 1/26 and had persistent retention and his Foley catheter was replaced.  He has no complaints today.  PMH: Past Medical History:  Diagnosis Date  . Atrial fibrillation (HCC)   . BPH (benign prostatic hyperplasia)   . COPD (chronic obstructive pulmonary disease) (HCC)   . Dieulafoy lesion of stomach   . GERD (gastroesophageal reflux disease)   . GIB (gastrointestinal bleeding) 02/15/2018  . Hemorrhagic shock (HCC)   . History of kidney stones   . Hypertension   . Pre-diabetes   . Upper GI bleed     Surgical History: Past Surgical History:  Procedure Laterality Date  . CYSTOSCOPY W/ RETROGRADES Right 11/23/2018   Procedure: CYSTOSCOPY WITH RETROGRADE PYELOGRAM;  Surgeon: Riki Altes, MD;  Location: [facility] ORS;  Service: Urology;  Laterality: Right;  . CYSTOSCOPY WITH HOLMIUM LASER LITHOTRIPSY  11/30/2018   Procedure: CYSTOSCOPY WITH HOLMIUM LASER LITHOTRIPSY;  Surgeon: Riki Altes, MD;  Location: [facility];  Service: Urology;;  . Bluford Kaufmann WITH LITHOLAPAXY N/A 11/23/2018   Procedure: CYSTOSCOPY WITH LITHOLAPAXY;  Surgeon: Riki Altes, MD;  Location: [facility];  Service: Urology;  Laterality: N/A;  . CYSTOSCOPY WITH LITHOLAPAXY N/A 11/30/2018   Procedure: CYSTOSCOPY WITH LITHOLAPAXY;  Surgeon: Riki Altes, MD;  Location: [facility];  Service: Urology;  Laterality: N/A;  . CYSTOSCOPY/URETEROSCOPY/HOLMIUM LASER/STENT PLACEMENT Right 11/23/2018   Procedure: CYSTOSCOPY/URETEROSCOPY/HOLMIUM LASER/STENT PLACEMENT;  Surgeon: Riki Altes, MD;  Location: [facility];  Service: Urology;  Laterality: Right;  . ESOPHAGOGASTRODUODENOSCOPY N/A 02/15/2018   Procedure: ESOPHAGOGASTRODUODENOSCOPY (EGD);  Surgeon: Toney Reil, MD;  Location: [facility];  Service: Gastroenterology;  Laterality: N/A;    Home Medications:  Allergies as of 04/14/2019   No Known Allergies     Medication List       Accurate as of April 14, 2019  9:29 AM. If you have any questions, ask your nurse or doctor.        amiodarone 200 MG tablet Commonly known as: PACERONE Take 1 tablet (200 mg total) by mouth 2 (two) times daily.   cefdinir 300 MG capsule Commonly known as: OMNICEF Take 1 capsule (300 mg total) by mouth 2 (two) times daily.   Combivent Respimat 20-100 MCG/ACT Aers respimat Generic drug: Ipratropium-Albuterol Inhale 2 puffs into the lungs 4 (four) times daily as needed for shortness of breath.   finasteride 5 MG tablet Commonly known as: PROSCAR Take 5 mg by mouth daily.   metoprolol tartrate 25 MG tablet Commonly known as: LOPRESSOR Take 1 tablet (25 mg total) by mouth 2 (two) times daily.   pantoprazole 40 MG tablet Commonly known as: PROTONIX Take 1 tablet (  40 mg total) by mouth 2 (two) times daily.   vitamin B-1 250 MG tablet Take 250 mg by mouth daily.       Allergies: No Known Allergies  Family History: Family History  Problem Relation Age of Onset  . Heart disease Father   . Aneurysm Sister     Social History:  reports that he has never smoked. He has never used smokeless tobacco. He reports  current alcohol use. He reports that he does not use drugs.  ROS: UROLOGY Frequent Urination?: No Hard to postpone urination?: No Burning/pain with urination?: No Get up at night to urinate?: No Leakage of urine?: No Urine stream starts and stops?: No Trouble starting stream?: No Do you have to strain to urinate?: No Blood in urine?: No Urinary tract infection?: No Sexually transmitted disease?: No Injury to kidneys or bladder?: No Painful intercourse?: No Weak stream?: No Erection problems?: No Penile pain?: No  Gastrointestinal Nausea?: No Vomiting?: No Indigestion/heartburn?: No Diarrhea?: No Constipation?: No  Constitutional Fever: No Night sweats?: No Weight loss?: No Fatigue?: No  Skin Skin rash/lesions?: No Itching?: No  Eyes Blurred vision?: No Double vision?: No  Ears/Nose/Throat Sore throat?: No Sinus problems?: No  Hematologic/Lymphatic Swollen glands?: No Easy bruising?: No  Cardiovascular Leg swelling?: No Chest pain?: No  Respiratory Cough?: No Shortness of breath?: No  Endocrine Excessive thirst?: No  Musculoskeletal Back pain?: No Joint pain?: No  Neurological Headaches?: No Dizziness?: No  Psychologic Depression?: No Anxiety?: No  Physical Exam: BP 138/86   Pulse 78   Ht 5\' 6"  (1.676 m)   Wt 148 lb (67.1 kg)   BMI 23.89 kg/m   Constitutional:  Alert and oriented, No acute distress. HEENT: [place] AT, moist mucus membranes.  Trachea midline, no masses. Cardiovascular: No clubbing, cyanosis, or edema. Respiratory: Normal respiratory effort, no increased work of breathing. GI: Abdomen is soft, nontender, nondistended, no abdominal masses GU: No CVA tenderness Lymph: No cervical or inguinal lymphadenopathy. Skin: No rashes, bruises or suspicious lesions. Neurologic: Grossly intact, no focal deficits, moving all 4 extremities. Psychiatric: Normal mood and affect.   Assessment & Plan:    - Urinary retention Prior  intraoperative cystoscopy did show a moderate median lobe and mild lateral lobe enlargement.  TURP was discussed.  He requested a repeat voiding trial prior to scheduling surgery.  His Foley catheter was removed.  He return this afternoon for a bladder scan for PVR.   Abbie Sons, [place] [address] [place], [place] [address] [phone]

## 2019-04-14 NOTE — Progress Notes (Signed)
Catheter Removal  Patient is present today for a catheter removal.  65ml of water was drained from the balloon. A 16FR foley cath was removed from the bladder no complications were noted . Patient tolerated well.  Performed KC:[id number] Verneal Wiers CMA   Follow up/ Additional notes: His afternoon  For PVR

## 2019-04-15 ENCOUNTER — Encounter: Payer: Self-pay | Admitting: Physician Assistant

## 2019-04-15 ENCOUNTER — Ambulatory Visit (INDEPENDENT_AMBULATORY_CARE_PROVIDER_SITE_OTHER): Payer: Medicare HMO | Admitting: Physician Assistant

## 2019-04-15 VITALS — BP 134/82 | HR 105 | Ht 66.0 in | Wt 148.0 lb

## 2019-04-15 DIAGNOSIS — N472 Paraphimosis: Secondary | ICD-10-CM | POA: Diagnosis not present

## 2019-04-15 DIAGNOSIS — R338 Other retention of urine: Secondary | ICD-10-CM

## 2019-04-15 DIAGNOSIS — N401 Enlarged prostate with lower urinary tract symptoms: Secondary | ICD-10-CM | POA: Diagnosis not present

## 2019-04-15 LAB — BLADDER SCAN AMB NON-IMAGING: Scan Result: 410

## 2019-04-15 NOTE — Progress Notes (Signed)
04/15/2019 10:40 AM   Marvin Ortega [birth year] [id number]  CC: Follow-up PVR  HPI: Marvin Ortega is a 69 y.o. male who presents today for follow-up PVR.  Urologic history significant for BPH with recent urinary retention and Serratia UTI with sepsis requiring hospitalization.  He failed an outpatient voiding trial with me on 04/05/2019 and was seen by Dr. Lonna Cobb yesterday to discuss bladder outlet procedures.  He was recommended to undergo TURP; patient requested an additional voiding trial prior to scheduling.  Foley catheter removed at that time with plans for PVR this morning  He reports that he has only been able to urinate several drops overnight.  He denies pain with urination, however he does say that his penis is swollen today and uncomfortable.  He states he is not circumcised and that he has been able to see the head of his penis for approximately 1 week, which he states is not normal for him.  PVR .  PMH: Past Medical History:  Diagnosis Date  . Atrial fibrillation (HCC)   . BPH (benign prostatic hyperplasia)   . COPD (chronic obstructive pulmonary disease) (HCC)   . Dieulafoy lesion of stomach   . GERD (gastroesophageal reflux disease)   . GIB (gastrointestinal bleeding) 02/15/2018  . Hemorrhagic shock (HCC)   . History of kidney stones   . Hypertension   . Pre-diabetes   . Upper GI bleed     Surgical History: Past Surgical History:  Procedure Laterality Date  . CYSTOSCOPY W/ RETROGRADES Right 11/23/2018   Procedure: CYSTOSCOPY WITH RETROGRADE PYELOGRAM;  Surgeon: Riki Altes, MD;  Location: [facility];  Service: Urology;  Laterality: Right;  . CYSTOSCOPY WITH HOLMIUM LASER LITHOTRIPSY  11/30/2018   Procedure: CYSTOSCOPY WITH HOLMIUM LASER LITHOTRIPSY;  Surgeon: Riki Altes, MD;  Location: [facility];  Service: Urology;;  . Bluford Kaufmann WITH LITHOLAPAXY N/A 11/23/2018   Procedure: CYSTOSCOPY WITH LITHOLAPAXY;  Surgeon: Riki Altes, MD;   Location: [facility];  Service: Urology;  Laterality: N/A;  . CYSTOSCOPY WITH LITHOLAPAXY N/A 11/30/2018   Procedure: CYSTOSCOPY WITH LITHOLAPAXY;  Surgeon: Riki Altes, MD;  Location: [facility];  Service: Urology;  Laterality: N/A;  . CYSTOSCOPY/URETEROSCOPY/HOLMIUM LASER/STENT PLACEMENT Right 11/23/2018   Procedure: CYSTOSCOPY/URETEROSCOPY/HOLMIUM LASER/STENT PLACEMENT;  Surgeon: Riki Altes, MD;  Location: [facility] ORS;  Service: Urology;  Laterality: Right;  . ESOPHAGOGASTRODUODENOSCOPY N/A 02/15/2018   Procedure: ESOPHAGOGASTRODUODENOSCOPY (EGD);  Surgeon: Toney Reil, MD;  Location: [facility];  Service: Gastroenterology;  Laterality: N/A;    Home Medications:  Allergies as of 04/15/2019   No Known Allergies     Medication List       Accurate as of April 15, 2019 10:40 AM. If you have any questions, ask your nurse or doctor.        amiodarone 200 MG tablet Commonly known as: PACERONE Take 1 tablet (200 mg total) by mouth 2 (two) times daily.   cefdinir 300 MG capsule Commonly known as: OMNICEF Take 1 capsule (300 mg total) by mouth 2 (two) times daily.   Combivent Respimat 20-100 MCG/ACT Aers respimat Generic drug: Ipratropium-Albuterol Inhale 2 puffs into the lungs 4 (four) times daily as needed for shortness of breath.   finasteride 5 MG tablet Commonly known as: PROSCAR Take 5 mg by mouth daily.   metoprolol tartrate 25 MG tablet Commonly known as: LOPRESSOR Take 1 tablet (25 mg total) by mouth 2 (two) times daily.   pantoprazole 40 MG tablet Commonly known as: PROTONIX  Take 1 tablet (40 mg total) by mouth 2 (two) times daily.   vitamin B-1 250 MG tablet Take 250 mg by mouth daily.       Allergies:  No Known Allergies  Family History: Family History  Problem Relation Age of Onset  . Heart disease Father   . Aneurysm Sister     Social History:   reports that he has never smoked. He has never used smokeless tobacco. He reports current  alcohol use. He reports that he does not use drugs.  ROS: UROLOGY Frequent Urination?: No Hard to postpone urination?: No Burning/pain with urination?: No Get up at night to urinate?: No Leakage of urine?: No Urine stream starts and stops?: No Trouble starting stream?: No Do you have to strain to urinate?: No Blood in urine?: No Urinary tract infection?: No Sexually transmitted disease?: No Injury to kidneys or bladder?: No Painful intercourse?: No Weak stream?: No Erection problems?: No Penile pain?: No  Gastrointestinal Nausea?: No Vomiting?: No Indigestion/heartburn?: No Diarrhea?: No Constipation?: No  Constitutional Fever: No Night sweats?: No Weight loss?: No Fatigue?: No  Skin Skin rash/lesions?: No Itching?: No  Eyes Blurred vision?: No Double vision?: No  Ears/Nose/Throat Sore throat?: No Sinus problems?: No  Hematologic/Lymphatic Swollen glands?: No Easy bruising?: No  Cardiovascular Leg swelling?: No Chest pain?: No  Respiratory Cough?: No Shortness of breath?: No  Endocrine Excessive thirst?: No  Musculoskeletal Back pain?: No Joint pain?: No  Neurological Headaches?: No Dizziness?: No  Psychologic Depression?: No Anxiety?: No  Physical Exam: BP 134/82   Pulse (!) 105   Ht 5\' 6"  (1.676 m)   Wt 148 lb (67.1 kg)   BMI 23.89 kg/m   Constitutional:  Alert and oriented, no acute distress, nontoxic appearing HEENT: [place], AT Cardiovascular: No clubbing, cyanosis, or edema Respiratory: Normal respiratory effort, no increased work of breathing GU: Retracted, edematous foreskin.  Glans without cyanosis. Skin: No rashes, bruises or suspicious lesions Neurologic: Grossly intact, no focal deficits, moving all 4 extremities Psychiatric: Normal mood and affect  Laboratory Data: Results for orders placed or performed in visit on 04/15/19  Bladder Scan (Post Void Residual) in office  Result Value Ref Range   Scan Result 410     Assessment & Plan:   1. Benign prostatic hyperplasia with urinary retention 69 year old male with BPH and recent history of urinary retention and a failed voiding trial, recommended to undergo TURP, here for follow-up PVR to complete his second voiding trial.  PVR elevated; second voiding trial failed.    I counseled the patient that given this second failed voiding trial, he will require long-term intervention to empty his bladder.  I explained that his options at this point include chronic Foley catheterization, CIC, or a bladder outlet procedure.  He has not decided yet how he would like to proceed.  In the interim, I offered the patient CIC teaching versus Foley catheter replacement.  He elected for Foley catheter replacement.  See separate procedure note for details.  Patient would like to discuss the possibility of undergoing TURP with his sisters.  I will plan for a 1 month catheter exchange in our clinic and counseled him to contact our office in the interim if he decides to proceed with surgery instead. - Bladder Scan (Post Void Residual) in office  2. Paraphimosis Patient had paraphimosis on physical exam today.  We were able to reduce the foreskin around the glans in its entirety today.  I counseled the patient extensively on  keeping his foreskin reduced around the glans.  I counseled him to contact the office immediately or proceed to the emergency room if his foreskin retracts and is unable to be reduced.  He expressed understanding.  Return in about 4 weeks (around 05/13/2019) for Catheter exchange.  Carman Ching, PA-C  [facility] [address], [address] [place], [place] [address] [phone]

## 2019-04-15 NOTE — Progress Notes (Signed)
Simple Catheter Placement  Due to urinary retention patient is present today for a foley cath placement.  Patient was cleaned and prepped in a sterile fashion with betadine. A 16 FR coud foley catheter was inserted, urine return was noted , urine was cloudy yellow in color.  The balloon was filled with 10cc of sterile water.  A leg bag was attached for drainage. Patient declined a night bag.  Patient was given instruction on proper catheter care.  Patient tolerated well, complications were noted as: Paraphimosis with reduction prior to catheter placement; foreskin remained reduced following catheter placement.  Performed by: Carman Ching, PA-C

## 2019-04-15 NOTE — Patient Instructions (Signed)
Keep foreskin reduced Return in one month for catheter exchange Will be in touch regarding surgery

## 2019-04-17 ENCOUNTER — Encounter: Payer: Self-pay | Admitting: Urology

## 2019-04-18 ENCOUNTER — Telehealth: Payer: Self-pay | Admitting: Radiology

## 2019-04-18 NOTE — Telephone Encounter (Signed)
Patient's sister, Beckett Maden, called saying patient is interested in TURP as discussed with Sam. Please advise.

## 2019-04-19 NOTE — Telephone Encounter (Signed)
FYI. Shall I fill out a booking sheet for TURP?

## 2019-04-26 ENCOUNTER — Other Ambulatory Visit: Payer: Self-pay | Admitting: Radiology

## 2019-04-26 DIAGNOSIS — N401 Enlarged prostate with lower urinary tract symptoms: Secondary | ICD-10-CM

## 2019-04-26 DIAGNOSIS — R338 Other retention of urine: Secondary | ICD-10-CM

## 2019-04-29 ENCOUNTER — Ambulatory Visit (INDEPENDENT_AMBULATORY_CARE_PROVIDER_SITE_OTHER): Payer: Medicare HMO | Admitting: Physician Assistant

## 2019-04-29 ENCOUNTER — Ambulatory Visit: Payer: Medicare HMO | Admitting: Physician Assistant

## 2019-04-29 ENCOUNTER — Encounter: Payer: Self-pay | Admitting: Physician Assistant

## 2019-04-29 ENCOUNTER — Other Ambulatory Visit: Payer: Self-pay

## 2019-04-29 DIAGNOSIS — N401 Enlarged prostate with lower urinary tract symptoms: Secondary | ICD-10-CM

## 2019-04-29 DIAGNOSIS — R338 Other retention of urine: Secondary | ICD-10-CM

## 2019-04-29 NOTE — Progress Notes (Signed)
Appointment made in error; this is a no charge visit.  Patient is scheduled to follow-up with me in clinic on 05/13/2019 for a catheter exchange and preop UA and urine culture.

## 2019-05-03 ENCOUNTER — Inpatient Hospital Stay: Admission: RE | Admit: 2019-05-03 | Payer: Medicare HMO | Source: Ambulatory Visit

## 2019-05-03 ENCOUNTER — Ambulatory Visit: Payer: Medicare HMO | Admitting: Physician Assistant

## 2019-05-06 ENCOUNTER — Other Ambulatory Visit: Payer: Medicare HMO

## 2019-05-13 ENCOUNTER — Encounter: Payer: Self-pay | Admitting: Physician Assistant

## 2019-05-13 ENCOUNTER — Ambulatory Visit: Payer: Self-pay | Admitting: Physician Assistant

## 2019-05-13 ENCOUNTER — Ambulatory Visit (INDEPENDENT_AMBULATORY_CARE_PROVIDER_SITE_OTHER): Payer: Medicare HMO | Admitting: Physician Assistant

## 2019-05-13 ENCOUNTER — Encounter
Admission: RE | Admit: 2019-05-13 | Discharge: 2019-05-13 | Disposition: A | Discharge status: Home / Self Care | Payer: Medicare HMO | Source: Ambulatory Visit | Attending: Urology | Admitting: Urology

## 2019-05-13 ENCOUNTER — Other Ambulatory Visit: Payer: Self-pay

## 2019-05-13 ENCOUNTER — Ambulatory Visit: Payer: Medicare HMO | Admitting: Physician Assistant

## 2019-05-13 VITALS — BP 151/86 | HR 69 | Ht 66.0 in | Wt 146.8 lb

## 2019-05-13 DIAGNOSIS — N401 Enlarged prostate with lower urinary tract symptoms: Secondary | ICD-10-CM | POA: Diagnosis not present

## 2019-05-13 DIAGNOSIS — R338 Other retention of urine: Secondary | ICD-10-CM | POA: Diagnosis not present

## 2019-05-13 HISTORY — DX: Anemia, unspecified: D64.9

## 2019-05-13 NOTE — Progress Notes (Signed)
Cath Change/ Replacement  Patient is present today for a catheter change due to urinary retention.  46ml of water was removed from the balloon, a 16FR coud foley cath was removed without difficulty.  Patient's foreskin was retracted, he was cleaned and prepped in a sterile fashion with betadine, and 2% lidocaine jelly was instilled into the urethra. A 16 FR coud foley cath was replaced into the bladder no complications were noted. Urine return was noted 79ml and urine was yellow in color. The balloon was filled with 12ml of sterile water and his foreskin was reduced. A leg bag was attached for drainage and secured to his thigh with a StatLock. He declined a night bag. Patient was given proper instruction on catheter care.    Performed by: Carman Ching, PA-C   Additional notes: A urine sample was collected today from his new catheter for preop UA and culture.  Follow up: TURP with Dr. Lonna Cobb scheduled for 05/24/2019

## 2019-05-13 NOTE — Pre-Procedure Instructions (Signed)
Sondra Barges, PA-C  Physician Assistant Certified  Cardiology     Progress Notes     Attested     Date of Service:  04/01/2019  8:01 AM                  Attested            Attestation signed by Antonieta Iba, MD at 04/01/2019 11:28 AM     Attending Note  Patient seen and examined, agree with detailed note above,    Patient presentation and plan discussed on rounds.       EKG lab work, chest x-ray, echocardiogram reviewed independently by myself     Improving cough, still coughing lots when he is eating  Somewhat productive with sputum though overall slowly improving  Shortness of breath slowly improving, denies abdominal swelling or leg edema  Telemetry reviewed no significant arrhythmia overnight     On examination : alert oriented, JVD 8+, lungs with scattered Rales, wheezing heart sounds regular normal S1-S2 no murmurs appreciated, abdomen soft nontender no significant lower extremity edema.  Musculoskeletal exam with good range of motion, neurologic exam grossly nonfocal     Lab work reviewed lab work  Sodium 137 potassium 4.5 creatinine 1.45 BUN 48 WBC 12.6 hematocrit 25.6     A/P:  1.Atrial fibrillation with RVR   Likely exacerbated by underlying respiratory distress, anemia, sepsis/UTI, fluids  ----Maintaining normal sinus rhythm on metoprolol with amiodarone oral dosing  --Currently not a good candidate for anticoagulation given his anemia and prior history upper GI bleed  --Consider metoprolol 12.5 up to 25 mg twice daily today  High risk of recurrent arrhythmia     2.  Urosepsis  Cultures showing Serratia Marcescens,   Sensitive to ceftriaxone  Consider weaning IV fluids, high risk of heart failure     3.  Acute on chronic bronchitis/COPD exacerbation  Significant coughing, sputum production  Slowly improving on antibiotics with prednisone, nebulizers  Good improvement of the past 48 hours, still  coughing when he eats     4.  Acute on chronic renal failure  Likely ATN in the setting of urosepsis  Dramatic improvement with fluids, time      Total encounter time more than 25 minutes   Greater than 50% was spent in counseling and coordination of care with the patient           Signed:  Dossie Arbour  M.D., Ph.D.  [facility]           Expand AllCollapse All            Expand widget buttonCollapse widget button    Show:Clear all   ManualTemplateCopied  Added by:     Sondra Barges, PA-C   Hover for detailscustomization button                                                                              untitled image        Progress Note     Patient Name: Marvin Ortega  Date of Encounter: 04/01/2019     Primary Cardiologist: new to [facility] - consult by [facility]  Subjective       Breathing improving. Cough persists, mostly after eating. Maintaining sinus rhythm. Renal function continues to improve. Remains on IV fluids. HGB low, though stable.       Inpatient Medications       Scheduled Meds:   .   amiodarone    400 mg   Oral   BID    .   Chlorhexidine Gluconate Cloth    6 each   Topical   Daily    .   enoxaparin (LOVENOX) injection    40 mg   Subcutaneous   Q24H    .   finasteride    5 mg   Oral   Daily    .   ipratropium-albuterol    3 mL   Nebulization   Q6H    .   methylPREDNISolone (SOLU-MEDROL) injection    80 mg   Intravenous   Q8H    .   metoprolol tartrate    25 mg   Oral   BID    .   potassium chloride    40 mEq   Oral   BID       Continuous Infusions:   .   cefTRIAXone (ROCEPHIN)  IV   Stopped (03/31/19 1352)    .   diltiazem (CARDIZEM) infusion   Stopped (03/31/19 1016)    .   lactated ringers   50 mL/hr at 04/01/19 0054        PRN Meds:  acetaminophen **OR** acetaminophen, guaiFENesin-dextromethorphan, HYDROcodone-homatropine, ondansetron **OR** ondansetron (ZOFRAN) IV       Vital Signs              Vitals:        03/31/19 2016   04/01/19 0215   04/01/19 0330   04/01/19 0746    BP:           106/76   125/83    Pulse:           83   71    Resp:           18   16    Temp:           98.1 F (36.7 C)   98.1 F (36.7 C)    TempSrc:           Oral   Oral    SpO2:   94%   96%   95%   93%    Weight:           75.9 kg        Height:                          Intake/Output Summary (Last 24 hours) at 04/01/2019 0801  Last data filed at 04/01/2019 0339      Gross per 24 hour    Intake   2974.37 ml    Output   1125 ml    Net   1849.37 ml             Filed Weights        03/28/19 1344   03/31/19 0600   04/01/19 0330    Weight:   63 kg   73.6 kg   75.9 kg           Telemetry       Converted to sinus rhythm around 8:43 AM on 1/21 and has maintained sinus since with PACs -  Personally Reviewed      ECG       No new tracings - Personally Reviewed      Physical Exam       GEN: No acute distress.    Neck: No JVD.  Cardiac: RRR, no murmurs, rubs, or gallops.   Respiratory: Diminished, coarse breath sounds bilaterally with wheezing.   GI: Soft, nontender, non-distended.    MS: No edema; No deformity.  Neuro:  Alert and oriented x 3; Nonfocal.   Psych: Normal affect.      Labs       Chemistry  Last Labs                                                                                                                                                                                         Hematology  Last Labs                                                                                                                                                                Cardiac Enzymes Last Labs         Last Labs            BNP  Last Labs                                     DDimer  Last Labs             Radiology       DG Chest Port 1 View     Result Date: 03/30/2019  IMPRESSION: No acute abnormality noted. Electronically Signed   By: Alcide Clever M.D.   On: 03/30/2019 10:05          Cardiac Studies       2D Echo 03/30/2019, read by outside group:  1. Left ventricular  ejection fraction, by visual estimation, is 60 to 65%. The left ventricle has normal function. There is no left ventricular hypertrophy.   2. Left ventricular diastolic parameters are indeterminate.   3. The left ventricle has no regional wall motion abnormalities.   4. Global right ventricle has normal systolic function.The right ventricular size is normal. No increase in right ventricular wall thickness.   5. Left atrial size was mildly dilated.   6. Right atrial size was mildly dilated.   7. The mitral valve is normal in structure. Trivial mitral valve regurgitation. No evidence of mitral stenosis.   8. The tricuspid valve is normal in structure.   9. The tricuspid valve is normal in structure. Tricuspid valve regurgitation is trivial.  10. The aortic valve is normal in structure. Aortic valve regurgitation is not visualized. No evidence of aortic valve sclerosis or stenosis.  11. The pulmonic valve was normal in structure. Pulmonic valve regurgitation is not visualized.  12. The inferior vena cava is normal in size with greater than 50% respiratory variability, suggesting right atrial pressure of 3 mmHg.      Patient Profile        69 y.o. male with  history of PAF, kidney stones with stage lithotripsy fall 2020, C. difficile, COPD, remote history alcohol, upper GI bleed, and BPH admitted with acute on chronic renal failure with obstructive uropathy, bronchitis, Afib with RVR/SVT.       Assessment & Plan       1. Afib with RVR/SVT: -Maintaining sinus rhythm with PACs  -Not a good candidate for anticoagulation at this time given anemia and history of GI bleed  -Started on PO amiodarone 1/21 in an effort to maintain sinus rhythm, will continue 10 gram load  -Titrate metoprolol to 25 mg bid  -CHADS2VASc at least 2 (HTN, age x 1)     2. Obstructive uropathy/urosepsis:  -Cultures growing Serratia   -Renal function improving as below  -ABX per IM     3. ARF: -Improving with IV fluids  -In the setting of obstructive uropathy/BPH  -Consider stopping IV fluids  -PO hydration      4. Anemia: -HGB low, though stable  -Not on [place] as above     5. Bronchitis: -     For questions or updates, please contact [place]  Please consult [email or web address] for contact info under Cardiology/STEMI.        Signed,  Christell Faith, PA-C  [place]  Pager: [phone]  04/01/2019, 8:01 AM               Cosigned by: [name], MD at 04/01/2019 11:28 AM   Electronically signed by Rise Mu, PA-C at 04/01/2019  8:23 AM  Electronically signed by [name], MD at 04/01/2019 11:28 AM             ED to Hosp-Admission (Discharged) on 03/28/2019               Revision History               Detailed Report             Note viewed by patient

## 2019-05-13 NOTE — Pre-Procedure Instructions (Signed)
Rollene Rotunda, MD  Physician  Cardiology     Progress Notes     Signed     Date of Service:  04/02/2019 10:11 AM                  Signed          Expand AllCollapse All            Expand widget buttonCollapse widget button    Show:Clear all   ManualTemplateCopied  Added by:     Rollene Rotunda, MD   Hover for detailscustomization button                                                                                      untitled image     Progress Note     Patient Name: Marvin Ortega  Date of Encounter: 04/02/2019     Primary Cardiologist:   No primary care provider on file.         Subjective       He says that he feels OK but gets SOB when he eats.  Denies chest pain or palpitations.       Inpatient Medications       Scheduled Meds:   .   amiodarone    400 mg   Oral   BID    .   Chlorhexidine Gluconate Cloth    6 each   Topical   Daily    .   finasteride    5 mg   Oral   Daily    .   loratadine    10 mg   Oral   Daily    .   metoprolol tartrate    25 mg   Oral   BID    .   potassium chloride    40 mEq   Oral   BID    .   predniSONE    40 mg   Oral   Q breakfast       Continuous Infusions:   .   sodium chloride   75 mL/hr (04/01/19 1411)    .   cefTRIAXone (ROCEPHIN)  IV   1 g (04/01/19 1416)    .   diltiazem (CARDIZEM) infusion   Stopped (03/31/19 1016)       PRN Meds:  sodium chloride, acetaminophen **OR** acetaminophen, guaiFENesin-dextromethorphan, HYDROcodone-homatropine, ipratropium-albuterol, ondansetron **OR** ondansetron (ZOFRAN) IV       Vital Signs              Vitals:        04/01/19 1601   04/01/19 1907   04/02/19 0400   04/02/19 0722    BP:   113/82   122/88   119/86   125/85     Pulse:   79   83   67   63    Resp:   20   20   20   20     Temp:   98.2 F (36.8 C)   98.5 F (36.9 C)   98.2 F (36.8 C)   98.1 F (36.7 C)    TempSrc:   Oral  Oral   Oral   Oral    SpO2:   100%   99%   100%   98%    Weight:                    Height:                          Intake/Output Summary (Last 24 hours) at 04/02/2019 1011  Last data filed at 04/02/2019 0400      Gross per 24 hour    Intake   480 ml    Output   800 ml    Net   -320 ml             Filed Weights        03/28/19 1344   03/31/19 0600   04/01/19 0330    Weight:   63 kg   73.6 kg   75.9 kg           Telemetry       NSR - Personally Reviewed      ECG       NA - Personally Reviewed      Physical Exam       GEN: No acute distress.    Neck: No  JVD  Cardiac: RRR, no murmurs, rubs, or gallops.   Respiratory:   Decreased breath sounds with diffuse wheezing  GI: Soft, nontender, non-distended   MS: No  edema; No deformity.  Neuro:  Nonfocal   Psych: Normal affect       Labs       Chemistry  Last Labs                                                                                                                                                                                         Hematology  Last Labs  Cardiac Enzymes Last Labs         Last Labs            BNP  Last Labs                                     DDimer  Last Labs              Radiology        Imaging Results (Last 48 hours)           Cardiac Studies       Echo 03/30/19      1. Left ventricular ejection fraction, by visual estimation, is 60 to 65%. The left ventricle has normal function. There is no left ventricular hypertrophy.   2. Left ventricular diastolic parameters are indeterminate.   3. The left ventricle has no regional wall motion abnormalities.   4. Global right ventricle has normal systolic function.The right ventricular size is normal. No increase in right ventricular wall thickness.   5. Left atrial size was mildly dilated.   6. Right atrial size was mildly dilated.   7. The mitral valve is normal in structure. Trivial mitral valve regurgitation. No evidence of mitral stenosis.   8. The tricuspid valve is normal in structure.   9. The tricuspid valve is normal in structure. Tricuspid valve regurgitation is trivial.  10. The aortic valve is normal in structure. Aortic valve regurgitation is not visualized. No evidence of aortic valve sclerosis or stenosis.  11. The pulmonic valve was normal in structure. Pulmonic valve regurgitation is not visualized.  12. The inferior vena cava is normal in size with greater than 50% respiratory variability, suggesting right atrial pressure of 3 mmHg.      Patient Profile        69 y.o. male with a hx of BPH, paroxysmal atrial fibrillation, kidney stones with stage lithotripsy fall 2020, C. difficile, COPD, remote history alcohol, upper GI bleed, presenting with confusion, weakness, malaise, urinary retention.  He had atrial fibrillation with RVR      Assessment & Plan       Atrial fibrillation with RVR  :  Converted to NSR spontaneously.  Not a good candidate for anticoagulation.  Started on amiodarone.  We will follow and leave follow up with our clinic and plans to titrate this dose down at discharge.  Beta blocker inreased yesterday.  No change in therapy  today.      Urosepsis :  Management per primary team.      Acute bronchitis:  Improved but not back to baseline.  Says that he gets SOB with eating.  Consider swallow eval if not previously done.  I will defer to the primary team.      Acute on chronic renal failure:   Creat is improved.  Follow.          For questions or updates, please contact [place]  Please consult [email or web address] for contact info under Cardiology/STEMI.     Signed,  Minus Breeding, MD   04/02/2019, 10:11 AM                 Electronically signed by Minus Breeding, MD at 04/02/2019 12:21 PM             ED to Hosp-Admission (Discharged) on 03/28/2019               Detailed Report  Note viewed by patient

## 2019-05-13 NOTE — Pre-Procedure Instructions (Signed)
Marvin Merritts, MD  Physician  Cardiology     Consult Note  Signed     Date of Service:  03/31/2019 11:49 AM                  Signed          Expand AllCollapse All            Expand widget buttonCollapse widget button    Show:Clear all   ManualTemplateCopied  Added by:     Marvin Merritts, MD   Hover for detailscustomization button                                                                                                                                                                                                   untitled image   Cardiology Consultation:       Patient ID: Marvin Ortega  MRN: [id number]; DOB: Oct 02, 1950     Admit date: 03/28/2019  Date of Consult: 03/31/2019     Primary Care Provider: Derinda Late, MD  Primary Cardiologist: new to [place]  Physician requesting consult: Dr. Jennye Boroughs  Reason for consult: Atrial fibrillation with RVR            Patient Profile:       Marvin Ortega is a 69 y.o. male with a hx of BPH, paroxysmal atrial fibrillation, kidney stones with stage lithotripsy fall 2020, C. difficile, COPD, remote history alcohol, upper GI bleed, presenting with confusion, weakness, malaise, urinary retention.  Last night with atrial fibrillation with RVR         History of Present Illness:       Presented with urine retention, septic appearing and renal failure creatinine 3.96, hypotensive systolic pressure of 62X, tachycardic, lactic acid greater than 11     He was given aggressive fluid resuscitation on arrival to the emergency room, 1.5 L given quickly and started on 125 cc/h  Telemetry showed sinus tachycardia, episodes of SVT rate up to 170s  Diagnosed  with sepsis due to UTI,  Blood cultures urine cultures drawn  Started on broad-spectrum antibiotics  Outpatient metoprolol was held for hypotension  -Noted to be markedly anemic hemoglobin 8 on arrival down to 6.8 after fluid hydration, given 1 unit packed red blood cells back up to 8.8 today     Yesterday with respiratory distress, IV fluids held, started on steroids, continued on antibiotics, started on nebulizers  Wheezing and respiratory distress has been improving overnight  He still reports significant coughing today     Overnight had several hours of  atrial fibrillation with RVR, started on diltiazem infusion and eventually converted to normal sinus rhythm        Heart Pathway Score:           Past Medical History:    Diagnosis   Date    .   Atrial fibrillation (HCC)        .   BPH (benign prostatic hyperplasia)        .   COPD (chronic obstructive pulmonary disease) (HCC)        .   Dieulafoy lesion of stomach        .   GERD (gastroesophageal reflux disease)        .   GIB (gastrointestinal bleeding)   02/15/2018    .   Hemorrhagic shock (HCC)        .   History of kidney stones        .   Hypertension        .   Pre-diabetes        .   Upper GI bleed                    Past Surgical History:    Procedure   Laterality   Date    .   CYSTOSCOPY W/ RETROGRADES   Right   11/23/2018        Procedure: CYSTOSCOPY WITH RETROGRADE PYELOGRAM;  Surgeon: Riki Altes, MD;  Location: [facility] ORS;  Service: Urology;  Laterality: Right;    .   CYSTOSCOPY WITH HOLMIUM LASER LITHOTRIPSY       11/30/2018        Procedure: CYSTOSCOPY WITH HOLMIUM LASER LITHOTRIPSY;  Surgeon: Riki Altes, MD;  Location: [facility];  Service: Urology;;    .   Bluford Kaufmann WITH LITHOLAPAXY   N/A   11/23/2018        Procedure: CYSTOSCOPY WITH LITHOLAPAXY;  Surgeon: Riki Altes, MD;  Location: [facility];  Service: Urology;  Laterality: N/A;    .   CYSTOSCOPY WITH LITHOLAPAXY   N/A   11/30/2018        Procedure: CYSTOSCOPY WITH LITHOLAPAXY;  Surgeon: Riki Altes, MD;  Location: [facility];  Service: Urology;  Laterality: N/A;    .   CYSTOSCOPY/URETEROSCOPY/HOLMIUM LASER/STENT PLACEMENT   Right   11/23/2018        Procedure: CYSTOSCOPY/URETEROSCOPY/HOLMIUM LASER/STENT PLACEMENT;  Surgeon: Riki Altes, MD;  Location: [facility];  Service: Urology;  Laterality: Right;    .   ESOPHAGOGASTRODUODENOSCOPY   N/A   02/15/2018        Procedure: ESOPHAGOGASTRODUODENOSCOPY (EGD);  Surgeon: Toney Reil, MD;  Location: [facility] END[facility]COPY;  Service: Gastroenterology;  Laterality: N/A;          Home Medications:           Prior to Admission medications     Medication   Sig   Start Date   End Date   Taking?   Authorizing Provider    COMBIVENT RESPIMAT 20-100 MCG/ACT AERS respimat   Inhale 2 puffs into the lungs 4 (four) times daily as needed for shortness of breath.   02/21/19       Yes   [provider]    finasteride (PROSCAR) 5 MG tablet   Take 5 mg by mouth daily.           Yes   [provider]    metoprolol succinate (TOPROL-XL) 50  MG 24 hr tablet   Take 100 mg by mouth daily.            Yes   [provider]    pantoprazole (PROTONIX) 40 MG tablet   Take 1 tablet (40 mg total) by mouth 2 (two) times daily.   02/19/18       Yes   Mayo, Allyn Kenner, MD    Thiamine HCl (VITAMIN B-1) 250 MG tablet   Take 250 mg by mouth daily.           Yes   [provider]          Inpatient Medications:  Scheduled Meds:   .   sodium chloride       Intravenous   Once    .   amiodarone    400 mg   Oral   BID    .   Chlorhexidine Gluconate Cloth    6 each   Topical   Daily    .   enoxaparin (LOVENOX)  injection    30 mg   Subcutaneous   Q24H    .   finasteride    5 mg   Oral   Daily    .   ipratropium-albuterol    3 mL   Nebulization   Q6H    .   methylPREDNISolone (SOLU-MEDROL) injection    80 mg   Intravenous   Q8H    .   metoprolol tartrate    12.5 mg   Oral   BID    .   potassium chloride    40 mEq   Oral   BID       Continuous Infusions:   .   cefTRIAXone (ROCEPHIN)  IV   Stopped (03/30/19 1328)    .   diltiazem (CARDIZEM) infusion   Stopped (03/31/19 1016)    .   lactated ringers   75 mL/hr at 03/31/19 1000       PRN Meds:  acetaminophen **OR** acetaminophen, guaiFENesin-dextromethorphan, HYDROcodone-homatropine, ondansetron **OR** ondansetron (ZOFRAN) IV     Allergies:   No Known Allergies     Social History:     Social History             Socioeconomic History    .   Marital status:   Divorced            Spouse name:   Not on file    .   Number of children:   Not on file    .   Years of education:   Not on file    .   Highest education level:   Not on file    Occupational History    .   Not on file    Tobacco Use    .   Smoking status:   Never Smoker    .   Smokeless tobacco:   Never Used    Substance and Sexual Activity    .   Alcohol use:   Yes    .   Drug use:   Never    .   Sexual activity:   Yes            Birth control/protection:   None    Other Topics   Concern    .   Not on file    Social History Narrative    .   Not on file  Social Determinants of Health           Financial Resource Strain:     .   Difficulty of Paying Living Expenses: Not on file    Food Insecurity:     .   Worried About Programme researcher, broadcasting/film/video in the Last Year: Not on file    .   Ran Out of Food in the Last Year: Not on file    Transportation Needs:     .   Lack of  Transportation (Medical): Not on file    .   Lack of Transportation (Non-Medical): Not on file    Physical Activity:     .   Days of Exercise per Week: Not on file    .   Minutes of Exercise per Session: Not on file    Stress:     .   Feeling of Stress : Not on file    Social Connections:     .   Frequency of Communication with Friends and Family: Not on file    .   Frequency of Social Gatherings with Friends and Family: Not on file    .   Attends Religious Services: Not on file    .   Active Member of Clubs or Organizations: Not on file    .   Attends Banker Meetings: Not on file    .   Marital Status: Not on file    Intimate Partner Violence:     .   Fear of Current or Ex-Partner: Not on file    .   Emotionally Abused: Not on file    .   Physically Abused: Not on file    .   Sexually Abused: Not on file       Family History:             Family History    Problem   Relation   Age of Onset    .   Heart disease   Father        .   Aneurysm   Sister              ROS:   Please see the history of present illness.   Review of Systems   Constitutional: Negative.    HENT: Negative.    Respiratory: Positive for cough and shortness of breath.    Cardiovascular: Negative.    Gastrointestinal: Negative.    Musculoskeletal: Negative.    Neurological: Negative.    Psychiatric/Behavioral: Negative.    All other systems reviewed and are negative.         Physical Exam/Data:              Vitals:        03/31/19 0820   03/31/19 0832   03/31/19 0922   03/31/19 1122    BP:   92/68   91/75   118/74   107/70    Pulse:   (!) 140   99   98        Resp:                    Temp:                    TempSrc:                    SpO2:   94%   96%  Weight:                     Height:                          Intake/Output Summary (Last 24 hours) at 03/31/2019 1151  Last data filed at 03/31/2019 1000      Gross per 24 hour    Intake   2042.92 ml    Output   600 ml    Net   1442.92 ml        Last 3 Weights   03/31/2019   03/28/2019   12/30/2018    Weight (lbs)   162 lb 4.8 oz   139 lb   149 lb    Weight (kg)   73.619 kg   63.05 kg   67.586 kg        Body mass index is 26.2 kg/m.   General:  Well nourished, well developed, in no acute distress  HEENT: normal  Lymph: no adenopathy  Neck: no JVD  Endocrine:  No thryomegaly  Vascular: No carotid bruits; FA pulses 2+ bilaterally without bruits   Cardiac:  normal S1, S2; RRR; no murmur   Lungs: Coarse breath sounds, scattered wheezing  Abd: soft, nontender, no hepatomegaly   Ext: no edema  Musculoskeletal:  No deformities, BUE and BLE strength normal and equal  Skin: warm and dry   Neuro:  CNs 2-12 intact, no focal abnormalities noted  Psych:  Normal affect      EKG:  The EKG was personally reviewed from March 29, 2019 and demonstrates:    Shows sinus tachycardia rate 118 bpm nonspecific ST abnormality anterolateral leads V3 through V6     Telemetry:  Telemetry was personally reviewed and demonstrates: Normal sinus rhythm     Relevant CV Studies:  Echocardiogram images personally reviewed by myself showing normal ejection fraction estimated greater than 55%, normal RV size and function  Final report pending     Laboratory Data:     High Sensitivity Troponin:    Last Labs                                            Chemistry  Last Labs                                                                                                                                                                                        Last Labs  Hematology  Last Labs                                                                                                                                                                                                                                                                   BNP  Last Labs                                  DDimer  Last Labs              Radiology/Studies:   DG Abd 1 View     Result Date: 03/28/2019  CLINICAL DATA:  Difficulty urinating EXAM: ABDOMEN - 1 VIEW COMPARISON:  10/07/2018 CT of the abdomen FINDINGS: Scattered large and small bowel gas is noted. Left lower pole staghorn calculus is again noted and stable. No definitive renal or ureteral stones are seen. Previously noted bladder calculus has been removed in the interval. No bony abnormality is noted. 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 No acute abnormality noted. Electronically Signed   By: Alcide CleverMark  Lukens M.D.   On: 03/28/2019 16:50      US Renal     Result Date: 03/28/2019  CLINICAL DATA:  Acute kidney injury EXAM: RENAL / URINARY TRACT ULTRASOUND COMPLETE COMPARISON:  CT abdomen 10/07/2018 FINDINGS: Right Kidney: Renal measurements: 10.6 by 5.7 by 5.7 cm = volume: 178.6 mL. Tiny echogenic foci without definite shadowing, no well-defined stones are identified. No renal mass noted. No hydronephrosis. Left Kidney: Renal measurements: 11.6 by 5.8 by 5.7 cm = volume: 200.6 mL. Large stone in the left kidney lower pole corresponding to previous findings at CT, approximately 1.6 cm in long axis. Normal renal echogenicity. Exophytic 1.4 cm cyst from the left kidney lower pole. Bladder: The urinary bladder is  collapsed around the Foley catheter. Other: None. IMPRESSION: 1. 1.6 cm left kidney lower pole staghorn calculus as shown on prior CT. The smaller upper pole calculi previously present bilaterally on CT are not readily visible on today's ultrasound. 2. The urinary bladder appears collapsed around the Foley catheter. The very large bladder calculus shown on 10/07/2018 is not identified today. 3.  No hydronephrosis.  Normal renal echogenicity. Electronically Signed   By: Gaylyn Rong M.D.   On: 03/28/2019 16:39      DG Chest Port 1 View     Result Date: 03/30/2019  CLINICAL DATA:  Dyspnea EXAM: PORTABLE CHEST 1 VIEW COMPARISON:  12/31/2018 FINDINGS: Cardiac shadow is at the upper limits of normal in size. Tortuosity of the thoracic aorta is noted somewhat accentuated by patient rotation. The lungs are clear. No focal infiltrate or effusion is noted. No bony abnormality is seen. IMPRESSION: No acute abnormality noted. Electronically Signed   By: Alcide Clever M.D.   On: 03/30/2019 10:05      ECHOCARDIOGRAM COMPLETE     Result Date: 03/31/2019    ECHOCARDIOGRAM REPORT   Patient Name:   ALEKZANDER CARDELL Date of Exam: 03/30/2019 Medical Rec #:  [id number]         Height:       66.0 in Accession #:    [id number]        Weight:       139.0 lb Date of Birth:  May 11, 1950         BSA:          1.71 m Patient Age:    68 years          BP:           114/72 mmHg Patient Gender: M                 HR:           124 bpm. Exam Location:  [facility] Procedure: 2D Echo, Cardiac Doppler and Limited Color Doppler Indications:     I47.2 Ventricular Tachycardia  History:         Patient has no prior history of Echocardiogram examinations.                  COPD; Risk Factors:Hypertension.  Sonographer:     Humphrey Rolls RDCS (AE) Referring Phys:  [id number] BRENDA MORRISON Diagnosing Phys: Adrian Blackwater MD  Sonographer Comments: Technically difficult study due to poor echo windows. TDS due to tachycardia, Image acquisition  challenging due to COPD and Image acquisition challenging due to respiratory motion. IMPRESSIONS  1. Left ventricular ejection fraction, by visual estimation, is 60 to 65%. The left ventricle has normal function. There is no left ventricular hypertrophy.  2. Left ventricular diastolic parameters are indeterminate.  3. The left ventricle has no regional wall motion abnormalities.  4. Global right ventricle has normal systolic function.The right ventricular size is normal. No increase in right ventricular wall thickness.  5. Left atrial size was mildly dilated.  6. Right atrial size was mildly dilated.  7. The mitral valve is normal in structure. Trivial mitral valve regurgitation. No evidence of mitral stenosis.  8. The tricuspid valve is normal in structure.  9. The tricuspid valve is normal in structure. Tricuspid valve regurgitation is trivial. 10. The aortic valve is normal in structure. Aortic valve regurgitation is not visualized. No evidence of aortic valve sclerosis or stenosis. 11. The pulmonic valve was normal in structure. Pulmonic valve regurgitation is not visualized. 12. The inferior vena cava is normal in size with greater than 50% respiratory variability, suggesting right atrial pressure of 3 mmHg. FINDINGS  Left Ventricle: Left ventricular ejection fraction, by visual estimation, is 60 to 65%. The left ventricle has normal function. The left ventricle has no regional wall motion abnormalities. There is no left ventricular hypertrophy. Left ventricular  diastolic parameters are indeterminate. Normal left atrial pressure. Right Ventricle: The right ventricular size is normal. No increase in right ventricular wall thickness. Global RV systolic function is has normal systolic function. Left Atrium: Left atrial size was mildly dilated. Right Atrium: Right atrial size was mildly dilated Pericardium: There is no evidence of pericardial effusion. Mitral Valve: The mitral valve is normal in structure. Trivial  mitral valve regurgitation. No evidence of mitral valve stenosis by observation. MV peak gradient, 6.7 mmHg. Tricuspid Valve: The tricuspid valve is normal in structure. Tricuspid valve regurgitation is trivial. Aortic Valve: The aortic valve is normal in structure. Aortic valve regurgitation is not visualized. The aortic valve is structurally normal, with no evidence of sclerosis or stenosis. Aortic valve mean gradient measures 4.0 mmHg. Aortic valve peak gradient measures 7.3 mmHg. Aortic valve area, by VTI measures 3.73 cm. Pulmonic Valve: The pulmonic valve was normal in structure. Pulmonic valve regurgitation is not visualized. Pulmonic regurgitation is not visualized. Aorta: The aortic root, ascending aorta and aortic arch are all structurally normal, with no evidence of dilitation or obstruction. Venous: The inferior vena cava is normal in size with greater than 50% respiratory variability, suggesting right atrial pressure of 3 mmHg. IAS/Shunts: No atrial level shunt detected by color flow Doppler. There is no evidence of a patent foramen ovale. No ventricular septal defect is seen or detected. There is no evidence of an atrial septal defect.  LEFT VENTRICLE PLAX 2D LVIDd:         3.71 cm LVIDs:         2.68 cm LV PW:         0.91 cm LV IVS:        0.83 cm LVOT diam:     2.30 cm LV SV:         32 ml LV SV Index:   18.63 LVOT Area:     4.15 cm  LEFT ATRIUM           Index LA diam:      2.70 cm 1.58 cm/m LA Vol (A4C): 25.1 ml 14.65 ml/m  AORTIC VALVE                   PULMONIC VALVE AV Area (Vmax):    3.32 cm    PV Vmax:       0.79 m/s AV Area (Vmean):   3.19 cm    PV Vmean:      48.600 cm/s AV Area (VTI):     3.73 cm    PV VTI:        0.102 m AV Vmax:           135.00 cm/s PV Peak grad:  2.5 mmHg AV Vmean:          93.700 cm/s PV Mean grad:  1.0 mmHg AV VTI:            0.186 m AV Peak Grad:      7.3 mmHg AV Mean Grad:      4.0 mmHg LVOT Vmax:         108.00 cm/s LVOT Vmean:        71.900 cm/s LVOT VTI:           0.167 m LVOT/AV VTI ratio: 0.90  AORTA Ao Root diam: 3.90 cm MITRAL VALVE MV Area (PHT): 10.99 cm            SHUNTS MV Peak grad:  6.7 mmHg  Systemic VTI:  0.17 m MV Mean grad:  2.0 mmHg             Systemic Diam: 2.30 cm MV Vmax:       1.29 m/s MV Vmean:      60.9 cm/s MV VTI:        0.16 m MV PHT:        20.01 msec MV Decel Time: 69 msec MV E velocity: 113.00 cm/s 103 cm/s  Adrian BlackwaterShaukat Khan MD Electronically signed by Adrian BlackwaterShaukat Khan MD Signature Date/Time: 03/31/2019/9:22:46 AM    Final              Assessment and Plan:       1.Atrial fibrillation with RVR   Likely exacerbated by underlying respiratory distress, anemia, sepsis/UTI, fluids  --Normal sinus rhythm restored on diltiazem infusion  -He has high risk of recurrent arrhythmia given underlying bronchitis, fluid shifts, anemia  --Not a good candidate for anticoagulation at this time given anemia, prior history upper GI bleed, ongoing work-up  --In an effort to maintain normal sinus rhythm would continue diltiazem, will transition to oral dosing  --We will also recommend restart amiodarone oral dosing 400 twice daily  -Hold off on anticoagulation at this time  With blood pressure low agree with transitioning diltiazem infusion to oral metoprolol 25 twice daily with titration upwards as tolerated     2.  Urosepsis  Cultures showing Serratia Marcescens,   Sensitive to ceftriaxone  Blood pressure slowly improving, [place] ptosis improving  --Currently on fluids 75 cc/h.   May need to hold fluids for any worsening shortness of breath concerning for pulmonary edema     3.  Acute bronchitis  Significant coughing, sputum production  Improved symptoms compared to yesterday after steroids nebulizers and antibiotics  Appears to be heading in the right direction, able to talk in full sentences today     4.  Acute on chronic renal failure  Likely ATN in the setting of urosepsis  Slow improvement  with gentle IV fluids and antibiotics     Case discussed with nursing, also with hospitalist service   Total encounter time more than 110 minutes   Greater than 50% was spent in counseling and coordination of care with the patient           For questions or updates, please contact [facility]  Please consult [email or web address] for contact info under untitled image           Signed,  Julien Nordmannimothy Gollan, MD   03/31/2019 11:51 AM            Electronically signed by Antonieta IbaGollan, Timothy J, MD at 03/31/2019 12:09 PM             ED to Hosp-Admission (Discharged) on 03/28/2019               Routing History               Detailed Report

## 2019-05-13 NOTE — Patient Instructions (Addendum)
Your procedure is scheduled on: 05-24-19 TUESDAY Report to Same Day Surgery 2nd floor medical mall [facility] Entrance-take elevator on left to 2nd floor.  Check in with surgery information desk.) To find out your arrival time please call [phone] between 1PM - 3PM on 05-23-19 MONDAY  Remember: Instructions that are not followed completely may result in serious medical risk, up to and including death, or upon the discretion of your surgeon and anesthesiologist your surgery may need to be rescheduled.    _x___ 1. Do not eat food after midnight the night before your procedure. NO GUM OR CANDY AFTER MIDNIGHT. You may drink clear liquids up to 2 hours before you are scheduled to arrive at the hospital for your procedure.  Do not drink clear liquids within 2 hours of your scheduled arrival to the hospital.  Clear liquids include  --Water or Apple juice without pulp  --Gatorade  --Black Coffee or Clear Tea (No milk, no creamers, do not add anything to the coffee or Tea   ____Ensure clear carbohydrate drink on the way to the hospital for bariatric patients  ____Ensure clear carbohydrate drink 3 hours before surgery.     __x__ 2. No Alcohol for 24 hours before or after surgery.   __x__3. No Smoking or e-cigarettes for 24 prior to surgery.  Do not use any chewable tobacco products for at least 6 hour prior to surgery   ____  4. Bring all medications with you on the day of surgery if instructed.    __x__ 5. Notify your doctor if there is any change in your medical condition     (cold, fever, infections).    x___6. On the morning of surgery brush your teeth with toothpaste and water.  You may rinse your mouth with mouth wash if you wish.  Do not swallow any toothpaste or mouthwash.   Do not wear jewelry, make-up, hairpins, clips or nail polish.  Do not wear lotions, powders, or perfumes. You may wear deodorant.  Do not shave 48 hours prior to surgery. Men may shave face and neck.  Do  not bring valuables to the hospital.    [facility] is not responsible for any belongings or valuables.               Contacts, dentures or bridgework may not be worn into surgery.  Leave your suitcase in the car. After surgery it may be brought to your room.  For patients admitted to the hospital, discharge time is determined by your treatment team.  _  Patients discharged the day of surgery will not be allowed to drive home.  You will need someone to drive you home and stay with you the night of your procedure.    Please read over the following fact sheets that you were given:   [facility] Preparing for Surgery   _x___ TAKE THE FOLLOWING MEDICATION THE MORNING OF SURGERY WITH A SMALL SIP OF WATER. These include:  1. METOPROLOL   2. PROSCAR (FINASTERIDE)  3. PROTONIX (PANTOPRAZOLE)  4.  5.  6.  ____Fleets enema or Magnesium Citrate as directed.   ____ Use CHG Soap or sage wipes as directed on instruction sheet   _X___ Use inhalers on the day of surgery and bring to hospital day of surgery-USE YOUR COMBIVENT INHALER DAY OF SURGERY AND BRING INHALER TO HOSPITAL  ____ Stop Metformin and Janumet 2 days prior to surgery.    ____ Take 1/2 of usual insulin dose the night  before surgery and none on the morning surgery.   _x___ Follow recommendations from Cardiologist, Pulmonologist or PCP regarding stopping Aspirin, Coumadin, Plavix ,Eliquis, Effient, or Pradaxa, and Pletal-STOPPED ASPIRIN ON 05-17-19 PER DR STOIOFF'S OFFICE  X____Stop Anti-inflammatories such as Advil, Aleve, Ibuprofen, Motrin, Naproxen, Naprosyn, Goodies powders or aspirin products STOP 7 DAYS PRIOR TO SURGERY- OK to take Tylenol    ____ Stop supplements until after surgery   ____ Bring C-Pap to the hospital.

## 2019-05-16 ENCOUNTER — Telehealth: Payer: Self-pay | Admitting: Physician Assistant

## 2019-05-16 LAB — CULTURE, URINE COMPREHENSIVE

## 2019-05-16 MED ORDER — SULFAMETHOXAZOLE-TRIMETHOPRIM 800-160 MG PO TABS: 1.0000 | ORAL_TABLET | Freq: Two times a day (BID) | ORAL | 0 refills | Status: AC

## 2019-05-16 NOTE — Telephone Encounter (Signed)
Please contact the patient and inform him that I would like to start him on antibiotics in advance of his scheduled surgery next week.  I have sent a prescription for Bactrim DS twice daily x7 days to the [facility] in [place].  He should start this today or tomorrow.

## 2019-05-16 NOTE — Telephone Encounter (Signed)
Notified patient as instructed, patient pleased. Discussed follow-up appointments, patient agrees  

## 2019-05-18 ENCOUNTER — Encounter: Payer: Self-pay | Admitting: Urology

## 2019-05-18 ENCOUNTER — Other Ambulatory Visit: Payer: Self-pay | Admitting: Radiology

## 2019-05-18 NOTE — Pre-Procedure Instructions (Signed)
Progress Notes - documented in this encounter Babaoff, Jacqulyn Liner, MD - 02/21/2019 4:00 PM EST Formatting of this note might be different from the original. Marvin Ortega is a 69 y.o. male who is here for a med check.  HTN: He is taking metoprolol and he tolerates this well. His blood pressure is at goal on this regimen.  Centrilobular emphysema: He was given trelegy ellipta by Pulmonology. He stopped using it because he didn't feel like it helped him. He also noted that he was having nose bleeds and thought it might be related to the inhaler.  PAF: He is rate controlled on metoprolol.  Chronic anemia: His hemoglobin was 8.6 on recent labs which is stable for him.  CHF: He is taking lasix and his fluid balance is appropriate on this regimen.  Patient Active Problem List  Diagnosis  . Essential hypertension  . Benign prostatic hyperplasia (BPH) with urinary urgency  . Alcohol abuse  . PAF (paroxysmal atrial fibrillation) (CMS-HCC)  . Centrilobular emphysema (CMS-HCC)   Past Medical History:  Diagnosis Date  . BPH associated with nocturia  . Colon polyps 03/20/2014  Tubular Adenoma  . Diverticulosis 03/20/2014  Sigomid colon, descending colon, transverse colon and ascending colon  . History of hypertension  . Internal hemorrhoids 03/20/2014  . Tinnitus  Chronic.   Current Outpatient Medications:  . finasteride (PROSCAR) 5 mg tablet, Take 1 tablet by mouth once daily, Disp: 90 tablet, Rfl: 3 . FUROsemide (LASIX) 40 MG tablet, TAKE 1 TABLET BY MOUTH EVERY DAY, Disp: 90 tablet, Rfl: 1 . metoprolol succinate (TOPROL-XL) 50 MG XL tablet, Take 2 tablets by mouth once daily, Disp: 180 tablet, Rfl: 1 . pantoprazole (PROTONIX) 40 MG DR tablet, Take 1 tablet (40 mg total) by mouth 2 (two) times daily, Disp: 180 tablet, Rfl: 3 . potassium chloride (K-TAB) 20 mEq TbER ER tablet, Take 1 tablet (20 mEq total) by mouth 2 (two) times daily, Disp: 180 tablet, Rfl: 1 . thiamine (VITAMIN B-1) 250 MG  tablet, Take 250 mg by mouth once daily., Disp: , Rfl:  . fluticasone-umeclidinium-vilanterol (TRELEGY ELLIPTA) 100-62.5-25 mcg inhaler, Inhale 1 inhalation into the lungs once daily (Patient not taking: Reported on 02/21/2019 ), Disp: 1 each, Rfl: 0  No Known Allergies  Results for orders placed or performed in visit on 02/15/19  CBC w/auto Differential (3 Part)  Result Value Ref Range  WBC (White Blood Cell Count) 7.0 4.1 - 10.2 10^3/uL  RBC (Red Blood Cell Count) 3.27 (L) 4.69 - 6.13 10^6/uL  Hemoglobin 8.6 (L) 14.1 - 18.1 gm/dL  Hematocrit 28.3 (L) 40.0 - 52.0 %  MCV (Mean Corpuscular Volume) 86.5 80.0 - 100.0 fl  MCH (Mean Corpuscular Hemoglobin) 26.3 (L) 27.0 - 31.2 pg  MCHC (Mean Corpuscular Hemoglobin Concentration) 30.4 (L) 32.0 - 36.0 gm/dL  Platelet Count 199 150 - 450 10^3/uL  RDW-CV (Red Cell Distribution Width) 19.0 (H) 11.6 - 14.8 %  MPV (Mean Platelet Volume) 10.4 9.4 - 12.4 fl  Neutrophils 4.50 1.50 - 7.80 10^3/uL  Lymphocytes 1.40 1.00 - 3.60 10^3/uL  Mixed Count 1.10 (H) 0.10 - 0.90 10^3/uL  Neutrophil % 64.6 32.0 - 70.0 %  Lymphocyte % 19.3 10.0 - 50.0 %  Mixed % 16.1 (H) 3.0 - 14.4 %  Comprehensive Metabolic Panel (CMP)  Result Value Ref Range  Glucose 115 (H) 70 - 110 mg/dL  Sodium 138 136 - 145 mmol/L  Potassium 3.5 (L) 3.6 - 5.1 mmol/L  Chloride 105 97 - 109 mmol/L  Carbon  Dioxide (CO2) 27.9 22.0 - 32.0 mmol/L  Urea Nitrogen (BUN) 10 7 - 25 mg/dL  Creatinine 1.0 0.7 - 1.3 mg/dL  Glomerular Filtration Rate (eGFR), MDRD Estimate 90 >60 mL/min/1.73sq m  Calcium 8.4 (L) 8.7 - 10.3 mg/dL  AST 21 8 - 39 U/L  ALT 10 6 - 57 U/L  Alk Phos (alkaline Phosphatase) 162 (H) 34 - 104 U/L  Albumin 3.4 (L) 3.5 - 4.8 g/dL  Bilirubin, Total 1.0 0.3 - 1.2 mg/dL  Protein, Total 7.2 6.1 - 7.9 g/dL  A/G Ratio 0.9 (L) 1.0 - 5.0 gm/dL  Lipid Panel w/calc LDL  Result Value Ref Range  Cholesterol, Total 103 100 - 200 mg/dL  Triglyceride 68 35 - 199 mg/dL  HDL (High Density  Lipoprotein) Cholesterol 43.1 29.0 - 71.0 mg/dL  LDL Calculated 46 0 - 130 mg/dL  VLDL Cholesterol 14 mg/dL  Cholesterol/HDL Ratio 2.4   Review of Systems  No fever, chills, nausea, or vomiting  Exam  Vitals:  02/21/19 1550  BP: 126/78  Pulse: 72  SpO2: 98%  Weight: 68 kg (150 lb)  Height: 162.6 cm ('5\' 4"' )   Body mass index is 25.75 kg/m.  General: Patient is well-groomed, well-nourished, appears stated age in no acute distress  HEENT: head is atraumatic and normocephalic, neck is supple with no palpable nodules  CV: Regular rhythm and normal heart rate, no murmur, no JVD  Respiratory: Clear to auscultation throughout lung fields with no wheezing, crackles, or rhonchi. No increased work of breathing  Impression/Plan  HTN: His blood pressure is at goal.  Centrilobular Emphysema: He has not had improvement with the trelegy ellipta. I would like him to try combivent and see if he has any improvement.  PAF: He is rate controlled but can't be anticoagulated because of his bleeding risk and chronic anemia.  CHF: His fluid balance is appropriate on his current dose of lasix.    Electronically signed by Barnabas Lister, MD at 02/21/2019 4:13 PM EST

## 2019-05-18 NOTE — Pre-Procedure Instructions (Signed)
DURING PHONE INTERVIEW PT DID NOT KNOW WHAT MEDS HE WAS TAKING. I FINISHED INTERVIEW AND HE TOLD ME THAT HE WAS GOING TO GET HIS NIECE TO CALL ME Monday AND REVIEW HIS MEDS. I GAVE HIM MY NUMBER SO HE COULD GIVE IT TO HIS NIECE. SHE NEVER CALLED. I CALLED PT BACK TO DAY AND LEFT HIM A MESSAGE STATING THAT HE NEEDS TO CALL ME BACK SO WE CAN GET HIS MEDS UPDATED.

## 2019-05-18 NOTE — Pre-Procedure Instructions (Signed)
[name], MD  Physician  Cardiology     Consult Note  Signed     Date of Service:  03/31/2019 11:49 AM                  Signed          Expand AllCollapse All            Expand widget buttonCollapse widget button    Show:Clear all   ManualTemplateCopied  Added by:     [name], MD   Hover for detailscustomization button                                                                                                                                                                                                   untitled image   Cardiology Consultation:       Patient ID: Marvin Ortega  MRN: [id number]; DOB: Oct 02, 1950     Admit date: 03/28/2019  Date of Consult: 03/31/2019     Primary Care Provider: Derinda Late, MD  Primary Cardiologist: new to [place]  Physician requesting consult: Dr. Jennye Boroughs  Reason for consult: Atrial fibrillation with RVR            Patient Profile:       Marvin Ortega is a 69 y.o. male with a hx of BPH, paroxysmal atrial fibrillation, kidney stones with stage lithotripsy fall 2020, C. difficile, COPD, remote history alcohol, upper GI bleed, presenting with confusion, weakness, malaise, urinary retention.  Last night with atrial fibrillation with RVR         History of Present Illness:       Presented with urine retention, septic appearing and renal failure creatinine 3.96, hypotensive systolic pressure of 62X, tachycardic, lactic acid greater than 11     He was given aggressive fluid resuscitation on arrival to the emergency room, 1.5 L given quickly and started on 125 cc/h  Telemetry showed sinus tachycardia, episodes of SVT rate up to 170s  Diagnosed  with sepsis due to UTI,  Blood cultures urine cultures drawn  Started on broad-spectrum antibiotics  Outpatient metoprolol was held for hypotension  -Noted to be markedly anemic hemoglobin 8 on arrival down to 6.8 after fluid hydration, given 1 unit packed red blood cells back up to 8.8 today     Yesterday with respiratory distress, IV fluids held, started on steroids, continued on antibiotics, started on nebulizers  Wheezing and respiratory distress has been improving overnight  He still reports significant coughing today     Overnight had several hours of  atrial fibrillation with RVR, started on diltiazem infusion and eventually converted to normal sinus rhythm        Heart Pathway Score:           Past Medical History:    Diagnosis   Date    .   Atrial fibrillation (HCC)        .   BPH (benign prostatic hyperplasia)        .   COPD (chronic obstructive pulmonary disease) (HCC)        .   Dieulafoy lesion of stomach        .   GERD (gastroesophageal reflux disease)        .   GIB (gastrointestinal bleeding)   02/15/2018    .   Hemorrhagic shock (HCC)        .   History of kidney stones        .   Hypertension        .   Pre-diabetes        .   Upper GI bleed                    Past Surgical History:    Procedure   Laterality   Date    .   CYSTOSCOPY W/ RETROGRADES   Right   11/23/2018        Procedure: CYSTOSCOPY WITH RETROGRADE PYELOGRAM;  Surgeon: Riki Altes, MD;  Location: [facility] ORS;  Service: Urology;  Laterality: Right;    .   CYSTOSCOPY WITH HOLMIUM LASER LITHOTRIPSY       11/30/2018        Procedure: CYSTOSCOPY WITH HOLMIUM LASER LITHOTRIPSY;  Surgeon: Riki Altes, MD;  Location: [facility];  Service: Urology;;    .   Bluford Kaufmann WITH LITHOLAPAXY   N/A   11/23/2018        Procedure: CYSTOSCOPY WITH LITHOLAPAXY;  Surgeon: Riki Altes, MD;  Location: [facility];  Service: Urology;  Laterality: N/A;    .   CYSTOSCOPY WITH LITHOLAPAXY   N/A   11/30/2018        Procedure: CYSTOSCOPY WITH LITHOLAPAXY;  Surgeon: Riki Altes, MD;  Location: [facility];  Service: Urology;  Laterality: N/A;    .   CYSTOSCOPY/URETEROSCOPY/HOLMIUM LASER/STENT PLACEMENT   Right   11/23/2018        Procedure: CYSTOSCOPY/URETEROSCOPY/HOLMIUM LASER/STENT PLACEMENT;  Surgeon: Riki Altes, MD;  Location: [facility];  Service: Urology;  Laterality: Right;    .   ESOPHAGOGASTRODUODENOSCOPY   N/A   02/15/2018        Procedure: ESOPHAGOGASTRODUODENOSCOPY (EGD);  Surgeon: Toney Reil, MD;  Location: [facility] ENDOSCOPY;  Service: Gastroenterology;  Laterality: N/A;          Home Medications:           Prior to Admission medications     Medication   Sig   Start Date   End Date   Taking?   Authorizing Provider    COMBIVENT RESPIMAT 20-100 MCG/ACT AERS respimat   Inhale 2 puffs into the lungs 4 (four) times daily as needed for shortness of breath.   02/21/19       Yes   [provider]    finasteride (PROSCAR) 5 MG tablet   Take 5 mg by mouth daily.           Yes   [provider]    metoprolol succinate (TOPROL-XL) 50  MG 24 hr tablet   Take 100 mg by mouth daily.            Yes   [provider]    pantoprazole (PROTONIX) 40 MG tablet   Take 1 tablet (40 mg total) by mouth 2 (two) times daily.   02/19/18       Yes   Mayo, Allyn Kenner, MD    Thiamine HCl (VITAMIN B-1) 250 MG tablet   Take 250 mg by mouth daily.           Yes   [provider]          Inpatient Medications:  Scheduled Meds:   .   sodium chloride       Intravenous   Once    .   amiodarone    400 mg   Oral   BID    .   Chlorhexidine Gluconate Cloth    6 each   Topical   Daily    .   enoxaparin (LOVENOX)  injection    30 mg   Subcutaneous   Q24H    .   finasteride    5 mg   Oral   Daily    .   ipratropium-albuterol    3 mL   Nebulization   Q6H    .   methylPREDNISolone (SOLU-MEDROL) injection    80 mg   Intravenous   Q8H    .   metoprolol tartrate    12.5 mg   Oral   BID    .   potassium chloride    40 mEq   Oral   BID       Continuous Infusions:   .   cefTRIAXone (ROCEPHIN)  IV   Stopped (03/30/19 1328)    .   diltiazem (CARDIZEM) infusion   Stopped (03/31/19 1016)    .   lactated ringers   75 mL/hr at 03/31/19 1000       PRN Meds:  acetaminophen **OR** acetaminophen, guaiFENesin-dextromethorphan, HYDROcodone-homatropine, ondansetron **OR** ondansetron (ZOFRAN) IV     Allergies:   No Known Allergies     Social History:     Social History             Socioeconomic History    .   Marital status:   Divorced            Spouse name:   Not on file    .   Number of children:   Not on file    .   Years of education:   Not on file    .   Highest education level:   Not on file    Occupational History    .   Not on file    Tobacco Use    .   Smoking status:   Never Smoker    .   Smokeless tobacco:   Never Used    Substance and Sexual Activity    .   Alcohol use:   Yes    .   Drug use:   Never    .   Sexual activity:   Yes            Birth control/protection:   None    Other Topics   Concern    .   Not on file    Social History Narrative    .   Not on file  Social Determinants of Health           Financial Resource Strain:     .   Difficulty of Paying Living Expenses: Not on file    Food Insecurity:     .   Worried About Programme researcher, broadcasting/film/video in the Last Year: Not on file    .   Ran Out of Food in the Last Year: Not on file    Transportation Needs:     .   Lack of  Transportation (Medical): Not on file    .   Lack of Transportation (Non-Medical): Not on file    Physical Activity:     .   Days of Exercise per Week: Not on file    .   Minutes of Exercise per Session: Not on file    Stress:     .   Feeling of Stress : Not on file    Social Connections:     .   Frequency of Communication with Friends and Family: Not on file    .   Frequency of Social Gatherings with Friends and Family: Not on file    .   Attends Religious Services: Not on file    .   Active Member of Clubs or Organizations: Not on file    .   Attends Banker Meetings: Not on file    .   Marital Status: Not on file    Intimate Partner Violence:     .   Fear of Current or Ex-Partner: Not on file    .   Emotionally Abused: Not on file    .   Physically Abused: Not on file    .   Sexually Abused: Not on file       Family History:             Family History    Problem   Relation   Age of Onset    .   Heart disease   Father        .   Aneurysm   Sister              ROS:   Please see the history of present illness.   Review of Systems   Constitutional: Negative.    HENT: Negative.    Respiratory: Positive for cough and shortness of breath.    Cardiovascular: Negative.    Gastrointestinal: Negative.    Musculoskeletal: Negative.    Neurological: Negative.    Psychiatric/Behavioral: Negative.    All other systems reviewed and are negative.         Physical Exam/Data:              Vitals:        03/31/19 0820   03/31/19 0832   03/31/19 0922   03/31/19 1122    BP:   92/68   91/75   118/74   107/70    Pulse:   (!) 140   99   98        Resp:                    Temp:                    TempSrc:                    SpO2:   94%   96%  Weight:                     Height:                          Intake/Output Summary (Last 24 hours) at 03/31/2019 1151  Last data filed at 03/31/2019 1000      Gross per 24 hour    Intake   2042.92 ml    Output   600 ml    Net   1442.92 ml        Last 3 Weights   03/31/2019   03/28/2019   12/30/2018    Weight (lbs)   162 lb 4.8 oz   139 lb   149 lb    Weight (kg)   73.619 kg   63.05 kg   67.586 kg        Body mass index is 26.2 kg/m.   General:  Well nourished, well developed, in no acute distress  HEENT: normal  Lymph: no adenopathy  Neck: no JVD  Endocrine:  No thryomegaly  Vascular: No carotid bruits; FA pulses 2+ bilaterally without bruits   Cardiac:  normal S1, S2; RRR; no murmur   Lungs: Coarse breath sounds, scattered wheezing  Abd: soft, nontender, no hepatomegaly   Ext: no edema  Musculoskeletal:  No deformities, BUE and BLE strength normal and equal  Skin: warm and dry   Neuro:  CNs 2-12 intact, no focal abnormalities noted  Psych:  Normal affect      EKG:  The EKG was personally reviewed from March 29, 2019 and demonstrates:    Shows sinus tachycardia rate 118 bpm nonspecific ST abnormality anterolateral leads V3 through V6     Telemetry:  Telemetry was personally reviewed and demonstrates: Normal sinus rhythm     Relevant CV Studies:  Echocardiogram images personally reviewed by myself showing normal ejection fraction estimated greater than 55%, normal RV size and function  Final report pending     Laboratory Data:     High Sensitivity Troponin:    Last Labs                                            Chemistry  Last Labs                                                                                                                                                                                        Last Labs  Hematology  Last Labs                                                                                                                                                                                                                                                                   BNP  Last Labs                                  DDimer  Last Labs              Radiology/Studies:   DG Abd 1 View     Result Date: 03/28/2019  CLINICAL DATA:  Difficulty urinating EXAM: ABDOMEN - 1 VIEW COMPARISON:  10/07/2018 CT of the abdomen FINDINGS: Scattered large and small bowel gas is noted. Left lower pole staghorn calculus is again noted and stable. No definitive renal or ureteral stones are seen. Previously noted bladder calculus has been removed in the interval. No bony abnormality is noted. 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 No acute abnormality noted. Electronically Signed   By: Alcide CleverMark  Lukens M.D.   On: 03/28/2019 16:50      US Renal     Result Date: 03/28/2019  CLINICAL DATA:  Acute kidney injury EXAM: RENAL / URINARY TRACT ULTRASOUND COMPLETE COMPARISON:  CT abdomen 10/07/2018 FINDINGS: Right Kidney: Renal measurements: 10.6 by 5.7 by 5.7 cm = volume: 178.6 mL. Tiny echogenic foci without definite shadowing, no well-defined stones are identified. No renal mass noted. No hydronephrosis. Left Kidney: Renal measurements: 11.6 by 5.8 by 5.7 cm = volume: 200.6 mL. Large stone in the left kidney lower pole corresponding to previous findings at CT, approximately 1.6 cm in long axis. Normal renal echogenicity. Exophytic 1.4 cm cyst from the left kidney lower pole. Bladder: The urinary bladder is  collapsed around the Foley catheter. Other: None. IMPRESSION: 1. 1.6 cm left kidney lower pole staghorn calculus as shown on prior CT. The smaller upper pole calculi previously present bilaterally on CT are not readily visible on today's ultrasound. 2. The urinary bladder appears collapsed around the Foley catheter. The very large bladder calculus shown on 10/07/2018 is not identified today. 3.  No hydronephrosis.  Normal renal echogenicity. Electronically Signed   By: Gaylyn Rong M.D.   On: 03/28/2019 16:39      DG Chest Port 1 View     Result Date: 03/30/2019  CLINICAL DATA:  Dyspnea EXAM: PORTABLE CHEST 1 VIEW COMPARISON:  12/31/2018 FINDINGS: Cardiac shadow is at the upper limits of normal in size. Tortuosity of the thoracic aorta is noted somewhat accentuated by patient rotation. The lungs are clear. No focal infiltrate or effusion is noted. No bony abnormality is seen. IMPRESSION: No acute abnormality noted. Electronically Signed   By: Alcide Clever M.D.   On: 03/30/2019 10:05      ECHOCARDIOGRAM COMPLETE     Result Date: 03/31/2019    ECHOCARDIOGRAM REPORT   Patient Name:   ALEKZANDER CARDELL Date of Exam: 03/30/2019 Medical Rec #:  [id number]         Height:       66.0 in Accession #:    [id number]        Weight:       139.0 lb Date of Birth:  May 11, 1950         BSA:          1.71 m Patient Age:    68 years          BP:           114/72 mmHg Patient Gender: M                 HR:           124 bpm. Exam Location:  [facility] Procedure: 2D Echo, Cardiac Doppler and Limited Color Doppler Indications:     I47.2 Ventricular Tachycardia  History:         Patient has no prior history of Echocardiogram examinations.                  COPD; Risk Factors:Hypertension.  Sonographer:     Humphrey Rolls RDCS (AE) Referring Phys:  [id number] BRENDA MORRISON Diagnosing Phys: Adrian Blackwater MD  Sonographer Comments: Technically difficult study due to poor echo windows. TDS due to tachycardia, Image acquisition  challenging due to COPD and Image acquisition challenging due to respiratory motion. IMPRESSIONS  1. Left ventricular ejection fraction, by visual estimation, is 60 to 65%. The left ventricle has normal function. There is no left ventricular hypertrophy.  2. Left ventricular diastolic parameters are indeterminate.  3. The left ventricle has no regional wall motion abnormalities.  4. Global right ventricle has normal systolic function.The right ventricular size is normal. No increase in right ventricular wall thickness.  5. Left atrial size was mildly dilated.  6. Right atrial size was mildly dilated.  7. The mitral valve is normal in structure. Trivial mitral valve regurgitation. No evidence of mitral stenosis.  8. The tricuspid valve is normal in structure.  9. The tricuspid valve is normal in structure. Tricuspid valve regurgitation is trivial. 10. The aortic valve is normal in structure. Aortic valve regurgitation is not visualized. No evidence of aortic valve sclerosis or stenosis. 11. The pulmonic valve was normal in structure. Pulmonic valve regurgitation is not visualized. 12. The inferior vena cava is normal in size with greater than 50% respiratory variability, suggesting right atrial pressure of 3 mmHg. FINDINGS  Left Ventricle: Left ventricular ejection fraction, by visual estimation, is 60 to 65%. The left ventricle has normal function. The left ventricle has no regional wall motion abnormalities. There is no left ventricular hypertrophy. Left ventricular  diastolic parameters are indeterminate. Normal left atrial pressure. Right Ventricle: The right ventricular size is normal. No increase in right ventricular wall thickness. Global RV systolic function is has normal systolic function. Left Atrium: Left atrial size was mildly dilated. Right Atrium: Right atrial size was mildly dilated Pericardium: There is no evidence of pericardial effusion. Mitral Valve: The mitral valve is normal in structure. Trivial  mitral valve regurgitation. No evidence of mitral valve stenosis by observation. MV peak gradient, 6.7 mmHg. Tricuspid Valve: The tricuspid valve is normal in structure. Tricuspid valve regurgitation is trivial. Aortic Valve: The aortic valve is normal in structure. Aortic valve regurgitation is not visualized. The aortic valve is structurally normal, with no evidence of sclerosis or stenosis. Aortic valve mean gradient measures 4.0 mmHg. Aortic valve peak gradient measures 7.3 mmHg. Aortic valve area, by VTI measures 3.73 cm. Pulmonic Valve: The pulmonic valve was normal in structure. Pulmonic valve regurgitation is not visualized. Pulmonic regurgitation is not visualized. Aorta: The aortic root, ascending aorta and aortic arch are all structurally normal, with no evidence of dilitation or obstruction. Venous: The inferior vena cava is normal in size with greater than 50% respiratory variability, suggesting right atrial pressure of 3 mmHg. IAS/Shunts: No atrial level shunt detected by color flow Doppler. There is no evidence of a patent foramen ovale. No ventricular septal defect is seen or detected. There is no evidence of an atrial septal defect.  LEFT VENTRICLE PLAX 2D LVIDd:         3.71 cm LVIDs:         2.68 cm LV PW:         0.91 cm LV IVS:        0.83 cm LVOT diam:     2.30 cm LV SV:         32 ml LV SV Index:   18.63 LVOT Area:     4.15 cm  LEFT ATRIUM           Index LA diam:      2.70 cm 1.58 cm/m LA Vol (A4C): 25.1 ml 14.65 ml/m  AORTIC VALVE                   PULMONIC VALVE AV Area (Vmax):    3.32 cm    PV Vmax:       0.79 m/s AV Area (Vmean):   3.19 cm    PV Vmean:      48.600 cm/s AV Area (VTI):     3.73 cm    PV VTI:        0.102 m AV Vmax:           135.00 cm/s PV Peak grad:  2.5 mmHg AV Vmean:          93.700 cm/s PV Mean grad:  1.0 mmHg AV VTI:            0.186 m AV Peak Grad:      7.3 mmHg AV Mean Grad:      4.0 mmHg LVOT Vmax:         108.00 cm/s LVOT Vmean:        71.900 cm/s LVOT VTI:           0.167 m LVOT/AV VTI ratio: 0.90  AORTA Ao Root diam: 3.90 cm MITRAL VALVE MV Area (PHT): 10.99 cm            SHUNTS MV Peak grad:  6.7 mmHg  Systemic VTI:  0.17 m MV Mean grad:  2.0 mmHg             Systemic Diam: 2.30 cm MV Vmax:       1.29 m/s MV Vmean:      60.9 cm/s MV VTI:        0.16 m MV PHT:        20.01 msec MV Decel Time: 69 msec MV E velocity: 113.00 cm/s 103 cm/s  Adrian BlackwaterShaukat Khan MD Electronically signed by Adrian BlackwaterShaukat Khan MD Signature Date/Time: 03/31/2019/9:22:46 AM    Final              Assessment and Plan:       1.Atrial fibrillation with RVR   Likely exacerbated by underlying respiratory distress, anemia, sepsis/UTI, fluids  --Normal sinus rhythm restored on diltiazem infusion  -He has high risk of recurrent arrhythmia given underlying bronchitis, fluid shifts, anemia  --Not a good candidate for anticoagulation at this time given anemia, prior history upper GI bleed, ongoing work-up  --In an effort to maintain normal sinus rhythm would continue diltiazem, will transition to oral dosing  --We will also recommend restart amiodarone oral dosing 400 twice daily  -Hold off on anticoagulation at this time  With blood pressure low agree with transitioning diltiazem infusion to oral metoprolol 25 twice daily with titration upwards as tolerated     2.  Urosepsis  Cultures showing Serratia Marcescens,   Sensitive to ceftriaxone  Blood pressure slowly improving, [place] ptosis improving  --Currently on fluids 75 cc/h.   May need to hold fluids for any worsening shortness of breath concerning for pulmonary edema     3.  Acute bronchitis  Significant coughing, sputum production  Improved symptoms compared to yesterday after steroids nebulizers and antibiotics  Appears to be heading in the right direction, able to talk in full sentences today     4.  Acute on chronic renal failure  Likely ATN in the setting of urosepsis  Slow improvement  with gentle IV fluids and antibiotics     Case discussed with nursing, also with hospitalist service   Total encounter time more than 110 minutes   Greater than 50% was spent in counseling and coordination of care with the patient           For questions or updates, please contact [facility]  Please consult [email or web address] for contact info under untitled image           Signed,  Julien Nordmannimothy Gollan, MD   03/31/2019 11:51 AM            Electronically signed by Antonieta IbaGollan, Timothy J, MD at 03/31/2019 12:09 PM             ED to Hosp-Admission (Discharged) on 03/28/2019               Routing History               Detailed Report

## 2019-05-19 ENCOUNTER — Encounter: Payer: Self-pay | Admitting: Urology

## 2019-05-19 NOTE — Pre-Procedure Instructions (Addendum)
SECURE CHAT WITH DR Karlton Lemon:  Pt having TURP with [facility] on 05-24-19. Pt was admitted with urosepsis in Jan. Pt has h/o afib/copd/chf/thrombocytopenia. Can you look at EKG from Jan and let me know if we need any kind of clearance. Echo was also done in Jan. Dr Gollan/heichren (cardiology) saw pt in hospital in Jan and i copy and pasted that note. He is having bmp and cbc rechecked tomorrow. It looks like he has not f/u with cardiology since his discharge in Jan.   I think the workup is sufficient and we shouldn't need to get another EKG prior to proceeding unless he is having cardiac symptoms.   Ok great! No cardiac symptoms. Thanks!

## 2019-05-19 NOTE — Pre-Procedure Instructions (Signed)
Rollene Rotunda, MD  Physician  Cardiology     Progress Notes     Signed     Date of Service:  04/02/2019 10:11 AM                  Signed          Expand AllCollapse All            Expand widget buttonCollapse widget button    Show:Clear all   ManualTemplateCopied  Added by:     Rollene Rotunda, MD   Hover for detailscustomization button                                                                                      untitled image     Progress Note     Patient Name: Marvin Ortega  Date of Encounter: 04/02/2019     Primary Cardiologist:   No primary care provider on file.         Subjective       He says that he feels OK but gets SOB when he eats.  Denies chest pain or palpitations.       Inpatient Medications       Scheduled Meds:   .   amiodarone    400 mg   Oral   BID    .   Chlorhexidine Gluconate Cloth    6 each   Topical   Daily    .   finasteride    5 mg   Oral   Daily    .   loratadine    10 mg   Oral   Daily    .   metoprolol tartrate    25 mg   Oral   BID    .   potassium chloride    40 mEq   Oral   BID    .   predniSONE    40 mg   Oral   Q breakfast       Continuous Infusions:   .   sodium chloride   75 mL/hr (04/01/19 1411)    .   cefTRIAXone (ROCEPHIN)  IV   1 g (04/01/19 1416)    .   diltiazem (CARDIZEM) infusion   Stopped (03/31/19 1016)       PRN Meds:  sodium chloride, acetaminophen **OR** acetaminophen, guaiFENesin-dextromethorphan, HYDROcodone-homatropine, ipratropium-albuterol, ondansetron **OR** ondansetron (ZOFRAN) IV       Vital Signs              Vitals:        04/01/19 1601   04/01/19 1907   04/02/19 0400   04/02/19 0722    BP:   113/82   122/88   119/86   125/85     Pulse:   79   83   67   63    Resp:   20   20   20   20     Temp:   98.2 F (36.8 C)   98.5 F (36.9 C)   98.2 F (36.8 C)   98.1 F (36.7 C)    TempSrc:   Oral  Oral   Oral   Oral    SpO2:   100%   99%   100%   98%    Weight:                    Height:                          Intake/Output Summary (Last 24 hours) at 04/02/2019 1011  Last data filed at 04/02/2019 0400      Gross per 24 hour    Intake   480 ml    Output   800 ml    Net   -320 ml             Filed Weights        03/28/19 1344   03/31/19 0600   04/01/19 0330    Weight:   63 kg   73.6 kg   75.9 kg           Telemetry       NSR - Personally Reviewed      ECG       NA - Personally Reviewed      Physical Exam       GEN: No acute distress.    Neck: No  JVD  Cardiac: RRR, no murmurs, rubs, or gallops.   Respiratory:   Decreased breath sounds with diffuse wheezing  GI: Soft, nontender, non-distended   MS: No  edema; No deformity.  Neuro:  Nonfocal   Psych: Normal affect       Labs       Chemistry  Last Labs                                                                                                                                                                                         Hematology  Last Labs                                                                                                                                                                 Cardiac Enzymes Last Labs         Last Labs            BNP  Last Labs                                     DDimer  Last Labs              Radiology        Imaging Results (Last 48 hours)           Cardiac Studies       Echo 03/30/19      1. Left ventricular ejection fraction, by visual estimation, is 60 to 65%. The left ventricle has normal function. There is no left ventricular hypertrophy.   2. Left ventricular diastolic parameters are indeterminate.   3. The left ventricle has no regional wall motion abnormalities.   4. Global right ventricle has normal systolic function.The right ventricular size is normal. No increase in right ventricular wall thickness.   5. Left atrial size was mildly dilated.   6. Right atrial size was mildly dilated.   7. The mitral valve is normal in structure. Trivial mitral valve regurgitation. No evidence of mitral stenosis.   8. The tricuspid valve is normal in structure.   9. The tricuspid valve is normal in structure. Tricuspid valve regurgitation is trivial.  10. The aortic valve is normal in structure. Aortic valve regurgitation is not visualized. No evidence of aortic valve sclerosis or stenosis.  11. The pulmonic valve was normal in structure. Pulmonic valve regurgitation is not visualized.  12. The inferior vena cava is normal in size with greater than 50% respiratory variability, suggesting right atrial pressure of 3 mmHg.      Patient Profile        69 y.o. male with a hx of BPH, paroxysmal atrial fibrillation, kidney stones with stage lithotripsy fall 2020, C. difficile, COPD, remote history alcohol, upper GI bleed, presenting with confusion, weakness, malaise, urinary retention.  He had atrial fibrillation with RVR      Assessment & Plan       Atrial fibrillation with RVR  :  Converted to NSR spontaneously.  Not a good candidate for anticoagulation.  Started on amiodarone.  We will follow and leave follow up with our clinic and plans to titrate this dose down at discharge.  Beta blocker inreased yesterday.  No change in therapy  today.      Urosepsis :  Management per primary team.      Acute bronchitis:  Improved but not back to baseline.  Says that he gets SOB with eating.  Consider swallow eval if not previously done.  I will defer to the primary team.      Acute on chronic renal failure:   Creat is improved.  Follow.          For questions or updates, please contact [place]  Please consult [email or web address] for contact info under Cardiology/STEMI.     Signed,  Minus Breeding, MD   04/02/2019, 10:11 AM                 Electronically signed by Minus Breeding, MD at 04/02/2019 12:21 PM             ED to Hosp-Admission (Discharged) on 03/28/2019               Detailed Report  Note viewed by patient   

## 2019-05-20 ENCOUNTER — Other Ambulatory Visit
Admission: RE | Admit: 2019-05-20 | Discharge: 2019-05-20 | Disposition: A | Discharge status: Home / Self Care | Payer: Medicare HMO | Source: Ambulatory Visit | Attending: Urology | Admitting: Urology

## 2019-05-20 DIAGNOSIS — Z01812 Encounter for preprocedural laboratory examination: Secondary | ICD-10-CM | POA: Insufficient documentation

## 2019-05-20 DIAGNOSIS — Z20822 Contact with and (suspected) exposure to covid-19: Secondary | ICD-10-CM | POA: Diagnosis not present

## 2019-05-20 LAB — BASIC METABOLIC PANEL
Anion gap: 8 (ref 5–15)
BUN: 11 mg/dL (ref 8–23)
CO2: 21 mmol/L — ABNORMAL LOW (ref 22–32)
Calcium: 8.6 mg/dL — ABNORMAL LOW (ref 8.9–10.3)
Chloride: 109 mmol/L (ref 98–111)
Creatinine, Ser: 1.24 mg/dL (ref 0.61–1.24)
GFR calc Af Amer: >60 mL/min (ref >60)
GFR calc non Af Amer: 59 mL/min — ABNORMAL LOW (ref >60)
Glucose, Bld: 123 mg/dL — ABNORMAL HIGH (ref 70–99)
Potassium: 3.4 mmol/L — ABNORMAL LOW (ref 3.5–5.1)
Sodium: 138 mmol/L (ref 135–145)

## 2019-05-20 LAB — CBC
HCT: 30.6 % — ABNORMAL LOW (ref 39.0–52.0)
Hemoglobin: 9.6 g/dL — ABNORMAL LOW (ref 13.0–17.0)
MCH: 27.4 pg (ref 26.0–34.0)
MCHC: 31.4 g/dL (ref 30.0–36.0)
MCV: 87.4 fL (ref 80.0–100.0)
Platelets: 182 10*3/uL (ref 150–400)
RBC: 3.5 MIL/uL — ABNORMAL LOW (ref 4.22–5.81)
RDW: 20.4 % — ABNORMAL HIGH (ref 11.5–15.5)
WBC: 4.8 10*3/uL (ref 4.0–10.5)
nRBC: 0 % (ref 0.0–0.2)

## 2019-05-20 LAB — SARS CORONAVIRUS 2 (TAT 6-24 HRS): SARS Coronavirus 2: NEGATIVE

## 2019-05-23 MED ORDER — SODIUM CHLORIDE 0.9 % IV SOLN
1.0000 g | INTRAVENOUS | Status: AC
  Administered 2019-05-24: 1 g via INTRAVENOUS
  Filled 2019-05-23: qty 1

## 2019-05-24 ENCOUNTER — Observation Stay
Admission: RE | Admit: 2019-05-24 | Discharge: 2019-05-25 | Disposition: A | Discharge status: Home / Self Care | Payer: Medicare HMO | Attending: Urology | Admitting: Urology

## 2019-05-24 ENCOUNTER — Encounter: Payer: Self-pay | Admitting: Urology

## 2019-05-24 ENCOUNTER — Encounter
Admission: RE | Disposition: A | Discharge status: Home / Self Care | Payer: Self-pay | Source: Home / Self Care | Attending: Urology

## 2019-05-24 ENCOUNTER — Other Ambulatory Visit: Payer: Self-pay

## 2019-05-24 ENCOUNTER — Ambulatory Visit: Payer: Medicare HMO | Admitting: Anesthesiology

## 2019-05-24 DIAGNOSIS — R338 Other retention of urine: Secondary | ICD-10-CM | POA: Insufficient documentation

## 2019-05-24 DIAGNOSIS — I10 Essential (primary) hypertension: Secondary | ICD-10-CM | POA: Diagnosis not present

## 2019-05-24 DIAGNOSIS — Z87442 Personal history of urinary calculi: Secondary | ICD-10-CM | POA: Diagnosis not present

## 2019-05-24 DIAGNOSIS — Z9079 Acquired absence of other genital organ(s): Secondary | ICD-10-CM

## 2019-05-24 DIAGNOSIS — K219 Gastro-esophageal reflux disease without esophagitis: Secondary | ICD-10-CM | POA: Diagnosis not present

## 2019-05-24 DIAGNOSIS — I480 Paroxysmal atrial fibrillation: Secondary | ICD-10-CM | POA: Diagnosis not present

## 2019-05-24 DIAGNOSIS — N138 Other obstructive and reflux uropathy: Secondary | ICD-10-CM | POA: Insufficient documentation

## 2019-05-24 DIAGNOSIS — R7303 Prediabetes: Secondary | ICD-10-CM | POA: Diagnosis not present

## 2019-05-24 DIAGNOSIS — N401 Enlarged prostate with lower urinary tract symptoms: Secondary | ICD-10-CM | POA: Diagnosis present

## 2019-05-24 DIAGNOSIS — J449 Chronic obstructive pulmonary disease, unspecified: Secondary | ICD-10-CM | POA: Insufficient documentation

## 2019-05-24 HISTORY — PX: TRANSURETHRAL RESECTION OF PROSTATE: SHX73

## 2019-05-24 HISTORY — DX: Sepsis, unspecified organism: A41.9

## 2019-05-24 LAB — GLUCOSE, CAPILLARY
Glucose-Capillary: 85 mg/dL (ref 70–99)
Glucose-Capillary: 91 mg/dL (ref 70–99)

## 2019-05-24 SURGERY — TURP (TRANSURETHRAL RESECTION OF PROSTATE)
Anesthesia: General | Site: Prostate

## 2019-05-24 MED ORDER — ONDANSETRON HCL 4 MG/2ML IJ SOLN
INTRAMUSCULAR | Status: DC | PRN
  Administered 2019-05-24: 4 mg via INTRAVENOUS

## 2019-05-24 MED ORDER — THIAMINE HCL 100 MG PO TABS
250.0000 mg | ORAL_TABLET | Freq: Every day | ORAL | Status: DC
  Administered 2019-05-24 – 2019-05-25 (×2): 250 mg via ORAL
  Filled 2019-05-24 (×2): qty 3

## 2019-05-24 MED ORDER — SODIUM CHLORIDE 0.9 % IV SOLN
INTRAVENOUS | Status: DC
  Administered 2019-05-24: 50 mL/h via INTRAVENOUS

## 2019-05-24 MED ORDER — DEXAMETHASONE SODIUM PHOSPHATE 10 MG/ML IJ SOLN
INTRAMUSCULAR | Status: AC
  Filled 2019-05-24: qty 1

## 2019-05-24 MED ORDER — PHENYLEPHRINE HCL (PRESSORS) 10 MG/ML IV SOLN
INTRAVENOUS | Status: AC
  Filled 2019-05-24: qty 1

## 2019-05-24 MED ORDER — DEXAMETHASONE SODIUM PHOSPHATE 10 MG/ML IJ SOLN
INTRAMUSCULAR | Status: DC | PRN
  Administered 2019-05-24: 10 mg via INTRAVENOUS

## 2019-05-24 MED ORDER — PROPOFOL 500 MG/50ML IV EMUL
INTRAVENOUS | Status: AC
  Filled 2019-05-24: qty 50

## 2019-05-24 MED ORDER — METOPROLOL TARTRATE 50 MG PO TABS
ORAL_TABLET | ORAL | Status: AC
  Administered 2019-05-24: 100 mg via ORAL
  Filled 2019-05-24: qty 2

## 2019-05-24 MED ORDER — FUROSEMIDE 40 MG PO TABS
40.0000 mg | ORAL_TABLET | Freq: Every day | ORAL | Status: DC
  Administered 2019-05-24 – 2019-05-25 (×2): 40 mg via ORAL
  Filled 2019-05-24 (×2): qty 1

## 2019-05-24 MED ORDER — FINASTERIDE 5 MG PO TABS
5.0000 mg | ORAL_TABLET | Freq: Every morning | ORAL | Status: DC
  Administered 2019-05-24 – 2019-05-25 (×2): 5 mg via ORAL
  Filled 2019-05-24 (×2): qty 1

## 2019-05-24 MED ORDER — SENNOSIDES-DOCUSATE SODIUM 8.6-50 MG PO TABS
2.0000 | ORAL_TABLET | Freq: Every day | ORAL | Status: DC
  Administered 2019-05-24: 21:00:00 2 via ORAL
  Filled 2019-05-24: qty 2

## 2019-05-24 MED ORDER — CHLORHEXIDINE GLUCONATE CLOTH 2 % EX PADS
6.0000 | MEDICATED_PAD | Freq: Every day | CUTANEOUS | Status: DC
  Administered 2019-05-24 – 2019-05-25 (×2): 6 via TOPICAL

## 2019-05-24 MED ORDER — FENTANYL CITRATE (PF) 100 MCG/2ML IJ SOLN: 25.0000 ug | INTRAMUSCULAR | Status: DC | PRN

## 2019-05-24 MED ORDER — ONDANSETRON HCL 4 MG/2ML IJ SOLN: 4.0000 mg | Freq: Once | INTRAMUSCULAR | Status: DC | PRN

## 2019-05-24 MED ORDER — FENTANYL CITRATE (PF) 100 MCG/2ML IJ SOLN
INTRAMUSCULAR | Status: AC
  Filled 2019-05-24: qty 2

## 2019-05-24 MED ORDER — ONDANSETRON HCL 4 MG/2ML IJ SOLN: 4.0000 mg | INTRAMUSCULAR | Status: DC | PRN

## 2019-05-24 MED ORDER — LACTATED RINGERS IV SOLN: INTRAVENOUS | Status: DC | PRN

## 2019-05-24 MED ORDER — AMIODARONE HCL 200 MG PO TABS: 200.0000 mg | ORAL_TABLET | Freq: Two times a day (BID) | ORAL | Status: DC

## 2019-05-24 MED ORDER — IPRATROPIUM-ALBUTEROL 0.5-2.5 (3) MG/3ML IN SOLN: 3.0000 mL | RESPIRATORY_TRACT | Status: DC | PRN

## 2019-05-24 MED ORDER — METOPROLOL TARTRATE 50 MG PO TABS: 100.0000 mg | ORAL_TABLET | Freq: Once | ORAL | Status: AC

## 2019-05-24 MED ORDER — FENTANYL CITRATE (PF) 100 MCG/2ML IJ SOLN
INTRAMUSCULAR | Status: DC | PRN
  Administered 2019-05-24 (×4): 25 ug via INTRAVENOUS

## 2019-05-24 MED ORDER — BELLADONNA ALKALOIDS-OPIUM 16.2-60 MG RE SUPP: 1.0000 | Freq: Four times a day (QID) | RECTAL | Status: DC | PRN

## 2019-05-24 MED ORDER — SUCCINYLCHOLINE CHLORIDE 20 MG/ML IJ SOLN
INTRAMUSCULAR | Status: AC
  Filled 2019-05-24: qty 1

## 2019-05-24 MED ORDER — LORATADINE 10 MG PO TABS
10.0000 mg | ORAL_TABLET | Freq: Every day | ORAL | Status: DC
  Administered 2019-05-24 – 2019-05-25 (×2): 10 mg via ORAL
  Filled 2019-05-24 (×2): qty 1

## 2019-05-24 MED ORDER — PROPOFOL 10 MG/ML IV BOLUS
INTRAVENOUS | Status: DC | PRN
  Administered 2019-05-24: 100 mg via INTRAVENOUS

## 2019-05-24 MED ORDER — HYDROCODONE-ACETAMINOPHEN 5-325 MG PO TABS
1.0000 | ORAL_TABLET | Freq: Four times a day (QID) | ORAL | Status: DC | PRN
  Administered 2019-05-24: 1 via ORAL
  Filled 2019-05-24: qty 1

## 2019-05-24 MED ORDER — SODIUM CHLORIDE 0.9 % IV SOLN: INTRAVENOUS | Status: DC

## 2019-05-24 MED ORDER — IPRATROPIUM-ALBUTEROL 20-100 MCG/ACT IN AERS
2.0000 | INHALATION_SPRAY | Freq: Four times a day (QID) | RESPIRATORY_TRACT | Status: DC | PRN
  Filled 2019-05-24: qty 4

## 2019-05-24 MED ORDER — SODIUM CHLORIDE 0.9 % IR SOLN
3000.0000 mL | Status: DC
  Administered 2019-05-25: 03:00:00 3000 mL

## 2019-05-24 MED ORDER — METOPROLOL TARTRATE 50 MG PO TABS
100.0000 mg | ORAL_TABLET | Freq: Every morning | ORAL | Status: DC
  Administered 2019-05-24 – 2019-05-25 (×2): 100 mg via ORAL
  Filled 2019-05-24 (×2): qty 2

## 2019-05-24 MED ORDER — ONDANSETRON HCL 4 MG/2ML IJ SOLN
INTRAMUSCULAR | Status: AC
  Filled 2019-05-24: qty 2

## 2019-05-24 MED ORDER — MIDAZOLAM HCL 2 MG/2ML IJ SOLN
INTRAMUSCULAR | Status: AC
  Filled 2019-05-24: qty 2

## 2019-05-24 MED ORDER — ACETAMINOPHEN 500 MG PO TABS
1000.0000 mg | ORAL_TABLET | Freq: Four times a day (QID) | ORAL | Status: AC
  Administered 2019-05-24 – 2019-05-25 (×4): 1000 mg via ORAL
  Filled 2019-05-24 (×4): qty 2

## 2019-05-24 MED ORDER — LIDOCAINE HCL (CARDIAC) PF 100 MG/5ML IV SOSY
PREFILLED_SYRINGE | INTRAVENOUS | Status: DC | PRN
  Administered 2019-05-24: 80 mg via INTRAVENOUS

## 2019-05-24 MED ORDER — FAMOTIDINE 20 MG PO TABS
ORAL_TABLET | ORAL | Status: AC
  Administered 2019-05-24: 07:00:00 20 mg via ORAL
  Filled 2019-05-24: qty 1

## 2019-05-24 MED ORDER — PANTOPRAZOLE SODIUM 40 MG PO TBEC
40.0000 mg | DELAYED_RELEASE_TABLET | Freq: Two times a day (BID) | ORAL | Status: DC
  Administered 2019-05-24 – 2019-05-25 (×3): 40 mg via ORAL
  Filled 2019-05-24 (×3): qty 1

## 2019-05-24 MED ORDER — LIDOCAINE HCL (PF) 2 % IJ SOLN
INTRAMUSCULAR | Status: AC
  Filled 2019-05-24: qty 10

## 2019-05-24 MED ORDER — FAMOTIDINE 20 MG PO TABS: 20.0000 mg | ORAL_TABLET | Freq: Once | ORAL | Status: AC

## 2019-05-24 SURGICAL SUPPLY — 28 items
ADAPTER IRRIG TUBE 2 SPIKE SOL (ADAPTER) ×6 IMPLANT
ADPR TBG 2 SPK PMP STRL ASCP (ADAPTER) ×2
BAG DRAIN CYSTO-URO LG1000N (MISCELLANEOUS) ×3 IMPLANT
BAG DRN LRG CPC RND TRDRP CNTR (MISCELLANEOUS) ×1
BAG URO DRAIN 4000ML (MISCELLANEOUS) ×3 IMPLANT
CATH FOL 2WAY LX 24X30 (CATHETERS) IMPLANT
CATH FOLEY 3WAY 30CC 22FR (CATHETERS) ×2 IMPLANT
DRAPE UTILITY 15X26 TOWEL STRL (DRAPES) ×3 IMPLANT
DRSG TELFA 4X3 1S NADH ST (GAUZE/BANDAGES/DRESSINGS) ×2 IMPLANT
ELECT LOOP 22F BIPOLAR SML (ELECTROSURGICAL)
ELECTRODE LOOP 22F BIPOLAR SML (ELECTROSURGICAL) IMPLANT
GLOVE BIO SURGEON STRL SZ8 (GLOVE) ×3 IMPLANT
GOWN STRL REUS W/ TWL LRG LVL3 (GOWN DISPOSABLE) ×2 IMPLANT
GOWN STRL REUS W/ TWL XL LVL3 (GOWN DISPOSABLE) ×1 IMPLANT
GOWN STRL REUS W/TWL LRG LVL3 (GOWN DISPOSABLE) ×6
GOWN STRL REUS W/TWL XL LVL3 (GOWN DISPOSABLE) ×3
HOLDER FOLEY CATH W/STRAP (MISCELLANEOUS) ×3 IMPLANT
KIT TURNOVER CYSTO (KITS) ×3 IMPLANT
LOOP CUT BIPOLAR 24F LRG (ELECTROSURGICAL) ×2 IMPLANT
PACK CYSTO AR (MISCELLANEOUS) ×3 IMPLANT
SET IRRIG Y TYPE TUR BLADDER L (SET/KITS/TRAYS/PACK) ×3 IMPLANT
SET IRRIGATING DISP (SET/KITS/TRAYS/PACK) ×3 IMPLANT
SOL .9 NS 3000ML IRR  AL (IV SOLUTION) ×18
SOL .9 NS 3000ML IRR AL (IV SOLUTION) ×6
SOL .9 NS 3000ML IRR UROMATIC (IV SOLUTION) ×6 IMPLANT
SYR TOOMEY 50ML (SYRINGE) ×3 IMPLANT
SYRINGE IRR TOOMEY STRL 70CC (SYRINGE) ×3 IMPLANT
WATER STERILE IRR 1000ML POUR (IV SOLUTION) ×3 IMPLANT

## 2019-05-24 NOTE — H&P (Signed)
05/24/2019 7:16 AM   Marvin Ortega 06/05/1950 [id number]   HPI: 69 yo male who underwent staged cystolitholapaxy for large bladder calculus in addition to ureteroscopy September 2020.  He declined an outlet procedure at the time of his cystolitholapaxy as he denied bothersome lower urinary tract symptoms.  He had onset urinary retention January 2021 was treated for sepsis secondary to UTI.  He had a Foley catheter placed.  He has failed several voiding trials and presents for TURP.  Preop urine culture did grow Serratia and he has been treated with antibiotics.   PMH: Past Medical History:  Diagnosis Date  . Anemia   . Atrial fibrillation (HCC)   . Atrial fibrillation (HCC)   . BPH (benign prostatic hyperplasia)   . COPD (chronic obstructive pulmonary disease) (HCC)   . Dieulafoy lesion of stomach   . GERD (gastroesophageal reflux disease)    h/o  . GIB (gastrointestinal bleeding) 02/15/2018  . Hemorrhagic shock (HCC)   . History of kidney stones   . Hypertension   . Pre-diabetes   . Sepsis (HCC)   . Upper GI bleed     Surgical History: Past Surgical History:  Procedure Laterality Date  . CYSTOSCOPY W/ RETROGRADES Right 11/23/2018   Procedure: CYSTOSCOPY WITH RETROGRADE PYELOGRAM;  Surgeon: Marvin Altes, MD;  Location: [facility] ORS;  Service: Urology;  Laterality: Right;  . CYSTOSCOPY WITH HOLMIUM LASER LITHOTRIPSY  11/30/2018   Procedure: CYSTOSCOPY WITH HOLMIUM LASER LITHOTRIPSY;  Surgeon: Marvin Altes, MD;  Location: [facility];  Service: Urology;;  . Bluford Kaufmann WITH LITHOLAPAXY N/A 11/23/2018   Procedure: CYSTOSCOPY WITH LITHOLAPAXY;  Surgeon: Marvin Altes, MD;  Location: [facility];  Service: Urology;  Laterality: N/A;  . CYSTOSCOPY WITH LITHOLAPAXY N/A 11/30/2018   Procedure: CYSTOSCOPY WITH LITHOLAPAXY;  Surgeon: Marvin Altes, MD;  Location: [facility];  Service: Urology;  Laterality: N/A;  . CYSTOSCOPY/URETEROSCOPY/HOLMIUM LASER/STENT PLACEMENT Right  11/23/2018   Procedure: CYSTOSCOPY/URETEROSCOPY/HOLMIUM LASER/STENT PLACEMENT;  Surgeon: Marvin Altes, MD;  Location: [facility];  Service: Urology;  Laterality: Right;  . ESOPHAGOGASTRODUODENOSCOPY N/A 02/15/2018   Procedure: ESOPHAGOGASTRODUODENOSCOPY (EGD);  Surgeon: Marvin Reil, MD;  Location: [facility]COPY;  Service: Gastroenterology;  Laterality: N/A;    Home Medications:  Reviewed  Allergies: No Known Allergies  Family History: Family History  Problem Relation Age of Onset  . Heart disease Father   . Aneurysm Sister     Social History:  reports that he has never smoked. He has never used smokeless tobacco. He reports previous alcohol use. He reports that he does not use drugs.   Physical Exam: BP (!) 149/83   Pulse 64   Temp 97.8 F (36.6 C) (Tympanic)   Resp 15   Ht 5\' 6"  (1.676 m)   Wt 66.6 kg   SpO2 100%   BMI 23.69 kg/m   Constitutional:  Alert No acute distress. HEENT: Marvin Ortega AT, moist mucus membranes.  Trachea midline, no masses. Cardiovascular: No clubbing, cyanosis, or edema.  RRR Respiratory: Normal respiratory effort, no increased work of breathing.  Lungs clear GI: Abdomen is soft, nontender, nondistended, no abdominal masses GU: No CVA tenderness Lymph: No cervical or inguinal lymphadenopathy. Skin: No rashes, bruises or suspicious lesions. Neurologic: Grossly intact, no focal deficits, moving all 4 extremities.   Assessment & Plan:    - Urinary retention [name] presents for TURP.  The indications and nature of the planned procedure were discussed as well as alternatives and the potential benefits and  expected outcome.  The most common complications discussed include but are not limited to bleeding, infection, irritative voiding symptoms which may be prolonged, bladder neck contracture, urethral stricture and rarely urinary incontinence.  It was stressed there is no guarantee that the surgery will be successful and that approximately 15% of  patients in urinary retention may have persistent retention post surgery.  The postoperative care and followup with discussed.  All of his questions were answered and he desires to proceed   Abbie Sons, [place] [address], [place], [place] [address] [phone]

## 2019-05-24 NOTE — Anesthesia Procedure Notes (Signed)
Procedure Name: LMA Insertion Date/Time: 05/24/2019 7:35 AM Performed by: Almeta Monas, CRNA Pre-anesthesia Checklist: Patient identified, Patient being monitored, Timeout performed, Emergency Drugs available and Suction available Patient Re-evaluated:Patient Re-evaluated prior to induction Oxygen Delivery Method: Circle system utilized Preoxygenation: Pre-oxygenation with 100% oxygen Induction Type: IV induction Ventilation: Mask ventilation without difficulty LMA: LMA inserted LMA Size: 3.5 Tube type: Oral Number of attempts: 1 Placement Confirmation: positive ETCO2 and breath sounds checked- equal and bilateral Tube secured with: Tape Dental Injury: Teeth and Oropharynx as per pre-operative assessment

## 2019-05-24 NOTE — Anesthesia Postprocedure Evaluation (Signed)
Anesthesia Post Note  Patient: Marvin Ortega  Procedure(s) Performed: TRANSURETHRAL RESECTION OF THE PROSTATE (TURP) (N/A Prostate)  Patient location during evaluation: PACU Anesthesia Type: General Level of consciousness: awake and alert and oriented Pain management: pain level controlled Vital Signs Assessment: post-procedure vital signs reviewed and stable Respiratory status: spontaneous breathing, nonlabored ventilation and respiratory function stable Cardiovascular status: blood pressure returned to baseline and stable Postop Assessment: no signs of nausea or vomiting Anesthetic complications: no     Last Vitals:  Vitals:   05/24/19 1021 05/24/19 1041  BP: 132/89 (!) 152/91  Pulse: 64 64  Resp: 10 16  Temp: 36.9 C 36.7 C  SpO2: 97% 99%    Last Pain:  Vitals:   05/24/19 1041  TempSrc: Temporal  PainSc: 0-No pain                 Cherye Gaertner

## 2019-05-24 NOTE — OR Nursing (Signed)
Attempted to call family at phone number provided but no answer.  Patient scheduled to stay overnight but was unaware of this.

## 2019-05-24 NOTE — Interval H&P Note (Signed)
History and Physical Interval Note:  05/24/2019 7:21 AM  Marvin Ortega  has presented today for surgery, with the diagnosis of BPH with urinary retention.  The various methods of treatment have been discussed with the patient and family. After consideration of risks, benefits and other options for treatment, the patient has consented to  Procedure(s): TRANSURETHRAL RESECTION OF THE PROSTATE (TURP) (N/A) as a surgical intervention.  The patient's history has been reviewed, patient examined, no change in status, stable for surgery.  I have reviewed the patient's chart and labs.  Questions were answered to the patient's satisfaction.     [name] C Muadh Creasy

## 2019-05-24 NOTE — Op Note (Signed)
Preoperative diagnosis: 1. Urinary retention secondary to BPH  Postoperative diagnosis:  1. Urinary retention secondary to BPH  Procedure:  1. Transurethral resection of the prostate  Surgeon: Lorin Picket C. Omarii Scalzo M.D.  Anesthesia: General  Complications: None  EBL: Minimal  Specimens: 1. Prostate chips   Indication: Marvin Ortega is a 69 y.o male with urinary retention.  He has failed several voiding trials.  Cystoscopy with moderate median lobe and mild lateral lobe enlargement.  After reviewing the management options for treatment, he elected to proceed with the above surgical procedure(s). We have discussed the potential benefits and risks of the procedure, side effects of the proposed treatment, the likelihood of the patient achieving the goals of the procedure, and any potential problems that might occur during the procedure or recuperation. Informed consent has been obtained.  Description of procedure:  The patient was taken to the operating room and general anesthesia was induced.  The patient was placed in the dorsal lithotomy position.  His Foley catheter was removed and the external genitalia were prepped and draped in the usual sterile fashion, and preoperative antibiotics were administered. A preoperative time-out was performed.   A 26 French continuous-flow resectoscope sheath with visual obturator was lubricated and passed under direct vision.  The urethra was normal, without stricture.  Prostate remarkable with moderate lateral lobe large mid and mild lateral lobe enlargement primarily distal adenoma.  The bladder was then systematically examined in its entirety. There was no evidence of any bladder tumors, stones, or other mucosal pathology.  The ureteral orifices were identified and marked so as to be avoided during the procedure.  The prostate adenoma was then resected utilizing loop cautery resection with the bipolar cutting loop.  The median lobe was initially  resected down the bladder neck fibers.  The left lateral lobe was then resected from the bladder neck to the verumontanum and the right lateral lobe was resected in similar fashion.  Care was taken not to resect distal to the verumontanum.  Hemostasis was then achieved with the cautery and the bladder was emptied and reinspected with no significant bleeding noted at the end of the procedure.  All chips were removed with a syringe irrigation.  A 24 French 3 way catheter was then placed into the bladder and placed on continuous bladder irrigation with return of clear effluent on moderate drip.  The patient appeared to tolerate the procedure well and without complications.  After anesthetic reversal he was transported to the PACU in satisfactory condition.   Marvin Axon, MD

## 2019-05-24 NOTE — Transfer of Care (Signed)
Immediate Anesthesia Transfer of Care Note  Patient: Marvin Ortega  Procedure(s) Performed: TRANSURETHRAL RESECTION OF THE PROSTATE (TURP) (N/A Prostate)  Patient Location: PACU  Anesthesia Type:General  Level of Consciousness: sedated  Airway & Oxygen Therapy: Patient Spontanous Breathing and Patient connected to face mask oxygen  Post-op Assessment: Report given to RN and Post -op Vital signs reviewed and stable  Post vital signs: Reviewed and stable  Last Vitals:  Vitals Value Taken Time  BP 151/92 05/24/19 0851  Temp    Pulse 64 05/24/19 0854  Resp 10 05/24/19 0854  SpO2 100 % 05/24/19 0854  Vitals shown include unvalidated device data.  Last Pain:  Vitals:   05/24/19 0642  TempSrc: Tympanic  PainSc: 0-No pain      Patients Stated Pain Goal: 0 (05/24/19 3582)  Complications: No apparent anesthesia complications

## 2019-05-24 NOTE — Anesthesia Preprocedure Evaluation (Signed)
Anesthesia Evaluation  Patient identified by MRN, date of birth, ID band Patient awake    Reviewed: Allergy & Precautions, NPO status , Patient's Chart, lab work & pertinent test results  History of Anesthesia Complications Negative for: history of anesthetic complications  Airway Mallampati: II  TM Distance: >3 FB Neck ROM: Full    Dental  (+) Edentulous Upper, Poor Dentition, Missing   Pulmonary neg sleep apnea, COPD,  COPD inhaler,    breath sounds clear to auscultation- rhonchi (-) wheezing      Cardiovascular hypertension, Pt. on medications (-) CAD, (-) Past MI, (-) Cardiac Stents and (-) CABG + dysrhythmias Atrial Fibrillation  Rhythm:Regular Rate:Normal - Systolic murmurs and - Diastolic murmurs    Neuro/Psych neg Seizures negative neurological ROS  negative psych ROS   GI/Hepatic Neg liver ROS, GERD  ,  Endo/Other  neg diabetes  Renal/GU Renal InsufficiencyRenal disease     Musculoskeletal negative musculoskeletal ROS (+)   Abdominal (+) - obese,   Peds  Hematology  (+) anemia ,   Anesthesia Other Findings Past Medical History: No date: Anemia No date: Atrial fibrillation (HCC) No date: Atrial fibrillation (HCC) No date: BPH (benign prostatic hyperplasia) No date: COPD (chronic obstructive pulmonary disease) (HCC) No date: Dieulafoy lesion of stomach No date: GERD (gastroesophageal reflux disease)     Comment:  h/o 02/15/2018: GIB (gastrointestinal bleeding) No date: Hemorrhagic shock (HCC) No date: History of kidney stones No date: Hypertension No date: Pre-diabetes No date: Sepsis (HCC) No date: Upper GI bleed   Reproductive/Obstetrics                             Anesthesia Physical Anesthesia Plan  ASA: III  Anesthesia Plan: General   Post-op Pain Management:    Induction: Intravenous  PONV Risk Score and Plan: 1 and Ondansetron and Dexamethasone  Airway  Management Planned: LMA  Additional Equipment:   Intra-op Plan:   Post-operative Plan:   Informed Consent: I have reviewed the patients History and Physical, chart, labs and discussed the procedure including the risks, benefits and alternatives for the proposed anesthesia with the patient or authorized representative who has indicated his/her understanding and acceptance.     Dental advisory given  Plan Discussed with: CRNA and Anesthesiologist  Anesthesia Plan Comments:         Anesthesia Quick Evaluation

## 2019-05-25 DIAGNOSIS — N401 Enlarged prostate with lower urinary tract symptoms: Secondary | ICD-10-CM | POA: Diagnosis not present

## 2019-05-25 LAB — BASIC METABOLIC PANEL
Anion gap: 4 — ABNORMAL LOW (ref 5–15)
BUN: 9 mg/dL (ref 8–23)
CO2: 23 mmol/L (ref 22–32)
Calcium: 8.2 mg/dL — ABNORMAL LOW (ref 8.9–10.3)
Chloride: 107 mmol/L (ref 98–111)
Creatinine, Ser: 0.96 mg/dL (ref 0.61–1.24)
GFR calc Af Amer: >60 mL/min (ref >60)
GFR calc non Af Amer: >60 mL/min (ref >60)
Glucose, Bld: 122 mg/dL — ABNORMAL HIGH (ref 70–99)
Potassium: 4.5 mmol/L (ref 3.5–5.1)
Sodium: 134 mmol/L — ABNORMAL LOW (ref 135–145)

## 2019-05-25 LAB — HEMOGLOBIN AND HEMATOCRIT, BLOOD
HCT: 25.9 % — ABNORMAL LOW (ref 39.0–52.0)
Hemoglobin: 8.3 g/dL — ABNORMAL LOW (ref 13.0–17.0)

## 2019-05-25 LAB — SURGICAL PATHOLOGY

## 2019-05-25 MED ORDER — METOPROLOL TARTRATE 50 MG PO TABS: ORAL_TABLET | ORAL | 0 refills | Status: DC

## 2019-05-25 MED ORDER — HYDROCODONE-ACETAMINOPHEN 5-325 MG PO TABS: 1.0000 | ORAL_TABLET | Freq: Four times a day (QID) | ORAL | 0 refills | Status: DC | PRN

## 2019-05-25 NOTE — Discharge Instructions (Signed)
Transurethral Resection of the Prostate (TURP)   Care After  Refer to this sheet in the next few weeks. These discharge instructions provide you with general information on caring for yourself after you leave the hospital. Your caregiver may also give you specific instructions. Your treatment has been planned according to the most current medical practices available, but unavoidable complications sometimes occur. If you have any problems or questions after discharge, please call your caregiver.  HOME CARE INSTRUCTIONS   Medications  You may receive medicine for pain management. As your level of discomfort decreases, adjustments in your pain medicines may be made.   A prescription for pain medication was sent to your pharmacy  Take all medicines as directed.   You may be given a medicine (antibiotic) to kill germs following surgery. Finish all medicines. Let your caregiver know if you have any side effects or problems from the medicine.   If you are on aspirin, it would be best not to restart the aspirin until the blood in the urine clears Hygiene  You can take a shower after surgery.   You should not take a bath while you still have the urethral catheter. Activity  You will be encouraged to get out of bed as much as possible and increase your activity level as tolerated.   Spend the first week in and around your home. For 3 weeks, avoid the following:   Straining.   Running.   Strenuous work.   Walks longer than a few blocks.   Riding for extended periods.   Sexual relations.   Do not lift heavy objects (more than 20 pounds) for at least 1 month. When lifting, use your arms instead of your abdominal muscles.   You will be encouraged to walk as tolerated. Do not exert yourself. Increase your activity level slowly. Remember that it is important to keep moving after an operation of any type. This cuts down on the possibility of developing blood clots.   Your caregiver will  tell you when you can resume driving and light housework. Discuss this at your first office visit after discharge. Diet  No special diet is ordered after a TURP. However, if you are on a special diet for another medical problem, it should be continued.   Normal fluid intake is usually recommended.   Avoid alcohol and caffeinated drinks for 2 weeks. They irritate the bladder. Decaffeinated drinks are okay.   Avoid spicy foods.  Bladder Function  For the first 10 days, empty the bladder whenever you feel a definite desire. Do not try to hold the urine for long periods of time.   Urinating once or twice a night even after you are healed is not uncommon.   You may see some recurrence of blood in the urine after discharge from the hospital. This usually happens within 2 weeks after the procedure.If this occurs, force fluids again as you did in the hospital and reduce your activity.  Bowel Function  You may experience some constipation after surgery. This can be minimized by increasing fluids and fiber in your diet. Drink enough water and fluids to keep your urine clear or pale yellow.   A stool softener may be prescribed for use at home. Do not strain to move your bowels.   If you are requiring increased pain medicine, it is important that you take stool softeners to prevent constipation. This will help to promote proper healing by reducing the need to strain to move your bowels.  Sexual Activity  Semen movement in the opposite direction and into the bladder (retrograde ejaculation) may occur. Since the semen passes into the bladder, cloudy urine can occur the first time you urinate after intercourse. Or, you may not have an ejaculation during erection. Ask your caregiver when you can resume sexual activity. Retrograde ejaculation and reduced semen discharge should not reduce one's pleasure of intercourse.  Postoperative Visit  Arrange the date and time of your after surgery visit with your  caregiver.  Return to Work  After your recovery is complete, you will be able to return to work and resume all activities. Your caregiver will inform you when you can return to work.   SEEK MEDICAL CARE IF:   You have chills or night sweats.   You are leaking around your catheter or have problems with your catheter. It is not uncommon to have sporadic leakage around your catheter as a result of bladder spasms. If the leakage stops, there is not much need for concern. If you are uncertain, call your caregiver.   You develop side effects that you think are coming from your medicines.  SEEK IMMEDIATE MEDICAL CARE IF:   You are suddenly unable to urinate. Check to see if there are any kinks in the drainage tubing that may cause this. If you cannot find any kinks, call your caregiver immediately. This is an emergency.   You develop shortness of breath or chest pains.   Bleeding persists or clots develop in your urine.   You have a fever.   You develop pain in your back or over your lower belly (abdomen).   You develop pain or swelling in your legs.   Any problems you are having get worse rather than better.  MAKE SURE YOU:   Understand these instructions.   Will watch your condition.   Will get help right away if you are not doing well or get worse.

## 2019-05-25 NOTE — Care Management Obs Status (Signed)
MEDICARE OBSERVATION STATUS NOTIFICATION   Patient Details  Name: Marvin Ortega MRN: [id number] Date of Birth: 18-Dec-1950   Medicare Observation Status Notification Given:  Yes    Trenton Founds, RN 05/25/2019, 11:39 AM

## 2019-05-25 NOTE — Discharge Summary (Signed)
Date of admission: 05/24/2019  Date of discharge: 05/25/2019  Admission diagnosis: BPH with obstruction  Discharge diagnosis: BPH with obstruction, status post TURP  Secondary diagnoses:  Patient Active Problem List   Diagnosis Date Noted  . S/P TURP 05/24/2019  . Acute renal failure (ARF) ([place]) 03/28/2019  . COPD (chronic obstructive pulmonary disease) ([place]) 03/28/2019  . Acidosis 03/28/2019  . Normocytic anemia 03/28/2019  . Bladder calculus 11/30/2018  . C. difficile colitis 11/25/2018  . Renal calculus 11/23/2018  . Ureteral calculi 10/29/2018  . Calculus of bladder 10/29/2018  . Nephrolithiasis 10/29/2018  . Gross hematuria 09/28/2018  . PAF (paroxysmal atrial fibrillation) ([place]) 02/25/2018  . Benign prostatic hyperplasia with lower urinary tract symptoms 12/08/2013  . Essential (primary) hypertension 12/08/2013  . AA (alcohol abuse) 12/08/2013  . GERD (gastroesophageal reflux disease) 02/13/2011  . Hyperlipidemia 06/20/2010    History and Physical: For full details, please see admission history and physical. Briefly, Marvin Ortega is a 69 y.o. year old patient with BPH with obstruction underwent a transurethral resection of the prostate .   Hospital Course: Patient tolerated the procedure well.  He was then transferred to the floor after an uneventful PACU stay.  His hospital course was uncomplicated.  On POD#1 he had met discharge criteria: was eating a regular diet, was up and ambulating independently,  pain was well controlled, was voiding without a catheter, and was ready to for discharge.   Laboratory values:  Recent Labs    05/25/19 0533  HGB 8.3*  HCT 25.9*   Recent Labs    05/25/19 0533  NA 134*  K 4.5  CL 107  CO2 23  GLUCOSE 122*  BUN 9  CREATININE 0.96  CALCIUM 8.2*   No results for input(s): LABPT, INR in the last 72 hours. No results for input(s): LABURIN in the last 72 hours. Results for orders placed or performed during the hospital  encounter of 05/20/19  SARS CORONAVIRUS 2 (TAT 6-24 HRS) Nasopharyngeal Nasopharyngeal Swab     Status: None   Collection Time: 05/20/19  8:13 AM   Specimen: Nasopharyngeal Swab  Result Value Ref Range Status   SARS Coronavirus 2 NEGATIVE NEGATIVE Final    Comment: (NOTE) SARS-CoV-2 target nucleic acids are NOT DETECTED. The SARS-CoV-2 RNA is generally detectable in upper and lower respiratory specimens during the acute phase of infection. Negative results do not preclude SARS-CoV-2 infection, do not rule out co-infections with other pathogens, and should not be used as the sole basis for treatment or other patient management decisions. Negative results must be combined with clinical observations, patient history, and epidemiological information. The expected result is Negative. Fact Sheet for Patients: SugarRoll.be Fact Sheet for Healthcare Providers: https://www.woods-mathews.com/ This test is not yet approved or cleared by the [place] FDA and  has been authorized for detection and/or diagnosis of SARS-CoV-2 by FDA under an Emergency Use Authorization (EUA). This EUA will remain  in effect (meaning this test can be used) for the duration of the COVID-19 declaration under Section 56 4(b)(1) of the Act, 21 U.S.C. section 360bbb-3(b)(1), unless the authorization is terminated or revoked sooner. Performed at [facility], [place] [address], [place], [place] [address]     Disposition: Home  Discharge instruction: The patient was instructed to be ambulatory but told to refrain from heavy lifting, strenuous activity, or driving.   Discharge medications:  Allergies as of 05/25/2019   No Known Allergies     Medication List  STOP taking these medications   amiodarone 200 MG tablet Commonly known as: PACERONE   cefdinir 300 MG capsule Commonly known as: OMNICEF   Combivent Respimat 20-100 MCG/ACT Aers respimat Generic drug:  Ipratropium-Albuterol     TAKE these medications   aspirin EC 81 MG tablet Take 81 mg by mouth daily.   finasteride 5 MG tablet Commonly known as: PROSCAR Take 5 mg by mouth every morning.   furosemide 40 MG tablet Commonly known as: LASIX Take 40 mg by mouth daily.   HYDROcodone-acetaminophen 5-325 MG tablet Commonly known as: NORCO/VICODIN Take 1 tablet by mouth every 6 (six) hours as needed for moderate pain.   loratadine 10 MG tablet Commonly known as: CLARITIN Take 10 mg by mouth daily.   metoprolol tartrate 50 MG tablet Commonly known as: LOPRESSOR Take one tablet every morning What changed:   how much to take  how to take this  when to take this  additional instructions  Another medication with the same name was removed. Continue taking this medication, and follow the directions you see here.   pantoprazole 40 MG tablet Commonly known as: PROTONIX Take 1 tablet (40 mg total) by mouth 2 (two) times daily.   vitamin B-1 250 MG tablet Take 250 mg by mouth daily.       Followup:   Dr. Bernardo Heater on 06/08/2019

## 2019-05-25 NOTE — Progress Notes (Signed)
Urology Consult Follow Up  Subjective: S/P TURP POD #1  Patient resting comfortably in bed.  VSS afebrile.  Hbg and HCT are stable at 8.3 and 25.9%.  Serum creatinine improved at 0.96.  Urine is clear on moderate CBI.   Anti-infectives: Anti-infectives (From admission, onward)   Start     Dose/Rate Route Frequency Ordered Stop   05/23/19 2144  cefTRIAXone (ROCEPHIN) 1 g in sodium chloride 0.9 % 100 mL IVPB     1 g 200 mL/hr over 30 Minutes Intravenous 30 min pre-op 05/23/19 2144 05/24/19 0734      Current Facility-Administered Medications  Medication Dose Route Frequency Provider Last Rate Last Admin  . 0.9 %  sodium chloride infusion   Intravenous Continuous Riki Altes, MD 75 mL/hr at 05/25/19 0501 Rate Verify at 05/25/19 0501  . acetaminophen (TYLENOL) tablet 1,000 mg  1,000 mg Oral Q6H Stoioff, Scott C, MD   1,000 mg at 05/25/19 0457  . Chlorhexidine Gluconate Cloth 2 % PADS 6 each  6 each Topical Daily Riki Altes, MD   6 each at 05/24/19 2107  . finasteride (PROSCAR) tablet 5 mg  5 mg Oral q morning - 10a Stoioff, Verna Czech, MD   5 mg at 05/24/19 1852  . furosemide (LASIX) tablet 40 mg  40 mg Oral Daily Stoioff, Scott C, MD   40 mg at 05/24/19 1852  . HYDROcodone-acetaminophen (NORCO/VICODIN) 5-325 MG per tablet 1 tablet  1 tablet Oral Q6H PRN Riki Altes, MD   1 tablet at 05/24/19 1852  . ipratropium-albuterol (DUONEB) 0.5-2.5 (3) MG/3ML nebulizer solution 3 mL  3 mL Nebulization Q4H PRN Hallaji, Sheema M, RPH      . loratadine (CLARITIN) tablet 10 mg  10 mg Oral Daily Stoioff, Scott C, MD   10 mg at 05/24/19 1852  . metoprolol tartrate (LOPRESSOR) tablet 100 mg  100 mg Oral q morning - 10a Stoioff, Verna Czech, MD   100 mg at 05/24/19 1852  . ondansetron (ZOFRAN) injection 4 mg  4 mg Intravenous Q4H PRN Stoioff, Scott C, MD      . opium-belladonna (B&O SUPPRETTES) 16.2-60 MG suppository 1 suppository  1 suppository Rectal Q6H PRN Stoioff, Scott C, MD      . pantoprazole  (PROTONIX) EC tablet 40 mg  40 mg Oral BID Riki Altes, MD   40 mg at 05/24/19 2107  . senna-docusate (Senokot-S) tablet 2 tablet  2 tablet Oral QHS Riki Altes, MD   2 tablet at 05/24/19 2107  . sodium chloride irrigation 0.9 % 3,000 mL  3,000 mL Irrigation Continuous Stoioff, Scott C, MD   3,000 mL at 05/25/19 0311  . thiamine tablet 250 mg  250 mg Oral Daily Stoioff, Scott C, MD   250 mg at 05/24/19 1852     Objective: Vital signs in last 24 hours: Temp:  [97.8 F (36.6 C)-98.5 F (36.9 C)] 98.3 F (36.8 C) (03/17 0504) Pulse Rate:  [63-80] 64 (03/17 0504) Resp:  [10-20] 18 (03/17 0504) BP: (119-152)/(75-95) 123/79 (03/17 0504) SpO2:  [94 %-100 %] 97 % (03/17 0504)  Intake/Output from previous day: 03/16 0701 - 03/17 0700 In: 3764 [P.O.:480; I.V.:1384; IV Piggyback:100] Out: 6010 [Urine:6000; Blood:10] Intake/Output this shift: No intake/output data recorded.   Physical Exam Constitutional:  Well nourished. Alert and oriented, No acute distress. HEENT: [place] AT, moist mucus membranes.  Trachea midline, no masses. Cardiovascular: No clubbing, cyanosis, or edema. Respiratory: Normal respiratory effort, no increased work of  breathing. GI: Abdomen is soft, non tender, non distended, no abdominal masses.   GU: No CVA tenderness.  No bladder fullness or masses.  Foley in place, draining clear urine on moderate drip Neurologic: Grossly intact, no focal deficits, moving all 4 extremities. Psychiatric: Normal mood and affect.  Lab Results:  Recent Labs    05/25/19 0533  HGB 8.3*  HCT 25.9*   BMET Recent Labs    05/25/19 0533  NA 134*  K 4.5  CL 107  CO2 23  GLUCOSE 122*  BUN 9  CREATININE 0.96  CALCIUM 8.2*   PT/INR No results for input(s): LABPROT, INR in the last 72 hours. ABG No results for input(s): PHART, HCO3 in the last 72 hours.  Invalid input(s): PCO2, PO2  Studies/Results: No results found.   Assessment: s/p Procedure(s): TRANSURETHRAL  RESECTION OF THE PROSTATE (TURP)  Plan: CBI turned off and will reassess this afternoon If urine remains clear will likely discharge today       LOS: 0 days    [facility] 05/25/2019

## 2019-06-07 NOTE — Progress Notes (Signed)
06/08/19 1:59 PM   [name] [birth year] [id number]  Referring provider: Derinda Late, MD [phone] [place],  [place] [address]  Chief Complaint  Patient presents with  . Benign Prostatic Hypertrophy    HPI: 69 y.o. male returns today s/p TURP for the evaluation and management of urinary retention.   -staged cystolitholapaxy for larger bladder calculus in addition to ureteroscopy 11/2018  -TURP 05/24/19; 29 g resection, benign tissue. Estimated volume preoperatively  -RUS unremarkable  -No complaints today -denies difficulties with urine stream, burning, urgency, incontinence and frequency -PVR 106 mL  PMH: Past Medical History:  Diagnosis Date  . Anemia   . Atrial fibrillation ([place])   . Atrial fibrillation ([place])   . BPH (benign prostatic hyperplasia)   . COPD (chronic obstructive pulmonary disease) ([place])   . [place]ulafoy lesion of stomach   . GERD (gastroesophageal reflux disease)    h/o  . GIB (gastrointestinal bleeding) 02/15/2018  . Hemorrhagic shock ([place])   . History of kidney stones   . Hypertension   . Pre-diabetes   . Sepsis ([place])   . Upper GI bleed     Surgical History: Past Surgical History:  Procedure Laterality Date  . CYSTOSCOPY W/ RETROGRADES Right 11/23/2018   Procedure: CYSTOSCOPY WITH RETROGRADE PYELOGRAM;  Surgeon: Abbie Sons, MD;  Location: [facility] ORS;  Service: Urology;  Laterality: Right;  . CYSTOSCOPY WITH HOLMIUM LASER LITHOTRIPSY  11/30/2018   Procedure: CYSTOSCOPY WITH HOLMIUM LASER LITHOTRIPSY;  Surgeon: Abbie Sons, MD;  Location: [facility] ORS;  Service: Urology;;  . Consuela Mimes WITH LITHOLAPAXY N/A 11/23/2018   Procedure: CYSTOSCOPY WITH LITHOLAPAXY;  Surgeon: Abbie Sons, MD;  Location: [facility] ORS;  Service: Urology;  Laterality: N/A;  . CYSTOSCOPY WITH LITHOLAPAXY N/A 11/30/2018   Procedure: CYSTOSCOPY WITH LITHOLAPAXY;  Surgeon: Abbie Sons, MD;  Location: [facility] ORS;   Service: Urology;  Laterality: N/A;  . CYSTOSCOPY/URETEROSCOPY/HOLMIUM LASER/STENT PLACEMENT Right 11/23/2018   Procedure: CYSTOSCOPY/URETEROSCOPY/HOLMIUM LASER/STENT PLACEMENT;  Surgeon: Abbie Sons, MD;  Location: [facility] ORS;  Service: Urology;  Laterality: Right;  . ESOPHAGOGASTRODUODENOSCOPY N/A 02/15/2018   Procedure: ESOPHAGOGASTRODUODENOSCOPY (EGD);  Surgeon: Lin Landsman, MD;  Location: [facility] ENDOSCOPY;  Service: Gastroenterology;  Laterality: N/A;  . TRANSURETHRAL RESECTION OF PROSTATE N/A 05/24/2019   Procedure: TRANSURETHRAL RESECTION OF THE PROSTATE (TURP);  Surgeon: Abbie Sons, MD;  Location: [facility] ORS;  Service: Urology;  Laterality: N/A;    Home Medications:  Allergies as of 06/08/2019   No Known Allergies     Medication List       Accurate as of June 08, 2019  1:59 PM. If you have any questions, ask your nurse or doctor.        aspirin EC 81 MG tablet Take 81 mg by mouth daily.   finasteride 5 MG tablet Commonly known as: PROSCAR Take 5 mg by mouth every morning.   furosemide 40 MG tablet Commonly known as: LASIX Take 40 mg by mouth daily.   HYDROcodone-acetaminophen 5-325 MG tablet Commonly known as: NORCO/VICODIN Take 1 tablet by mouth every 6 (six) hours as needed for moderate pain.   loratadine 10 MG tablet Commonly known as: CLARITIN Take 10 mg by mouth daily.   metoprolol tartrate 50 MG tablet Commonly known as: LOPRESSOR Take one tablet every morning   pantoprazole 40 MG tablet Commonly known as: PROTONIX Take 1 tablet (40 mg total) by mouth 2 (two) times daily.   vitamin  B-1 250 MG tablet Take 250 mg by mouth daily.       Allergies: No Known Allergies  Family History: Family History  Problem Relation Age of Onset  . Heart disease Father   . Aneurysm Sister     Social History:  reports that he has never smoked. He has never used smokeless tobacco. He reports previous alcohol use. He reports that he does not use drugs.    Physical Exam: BP (!) 151/82   Pulse 65   Ht 5\' 6"  (1.676 m)   Wt 147 lb (66.7 kg)   BMI 23.73 kg/m   Constitutional:  Alert and oriented, No acute distress. HEENT: [place] AT, moist mucus membranes.  Trachea midline, no masses. Cardiovascular: No clubbing, cyanosis, or edema. Respiratory: Normal respiratory effort, no increased work of breathing. Skin: No rashes, bruises or suspicious lesions. Neurologic: Grossly intact, no focal deficits, moving all 4 extremities. Psychiatric: Normal mood and affect.  Laboratory Data:  SURGICAL PATHOLOGY  CASE: [id number]  PATIENT: 09-07-1973  Surgical Pathology Report      Specimen Submitted:  A. Prostate chips   Clinical History: BPH with urinary retention      DIAGNOSIS:  A. PROSTATE CHIPS; TRANSURETHRAL RESECTION OF PROSTATE:  - BENIGN PROSTATIC TISSUE WITH NODULAR HYPERPLASIA.  - NEGATIVE FOR MALIGNANCY.    GROSS DESCRIPTION:  A. Labeled: Prostate chips  Received: In formalin  Type of procedure: Transurethral resection of the prostate (TURP)  Weight: 29 grams  Size: 8.7 x 6.5 x 1.5 cm  Description: Multiple fragments of pale-tan rubbery soft tissue  Percentage of tissue submitted for microscopic review: 100%    Pertinent Imaging: Results for orders placed or performed in visit on 06/08/19  Bladder Scan (Post Void Residual) in office  Result Value Ref Range   Scan Result 106    Reviewed RUS. See epic.   Assessment & Plan:    1. BPH with urinary retention  S/p TURP 05/24/19 with resolution of urinary retention Reviewed pathology, benign.  29 g resected Mild to moderate residual on bladder scan  Asymptomatic  Return in 6 month for PVR   05/26/19, MD  [facility] [address], [address] [place], [place] [phone]  I, [phone], am acting as a scribe for Dr. Donne Hazel C. ,  I have reviewed the above documentation for accuracy and  completeness, and I agree with the above.   Lorin Picket, MD

## 2019-06-08 ENCOUNTER — Ambulatory Visit (INDEPENDENT_AMBULATORY_CARE_PROVIDER_SITE_OTHER): Payer: Medicare HMO | Admitting: Urology

## 2019-06-08 ENCOUNTER — Other Ambulatory Visit: Payer: Self-pay

## 2019-06-08 ENCOUNTER — Encounter: Payer: Self-pay | Admitting: Urology

## 2019-06-08 VITALS — BP 151/82 | HR 65 | Ht 66.0 in | Wt 147.0 lb

## 2019-06-08 DIAGNOSIS — N401 Enlarged prostate with lower urinary tract symptoms: Secondary | ICD-10-CM | POA: Diagnosis not present

## 2019-06-08 DIAGNOSIS — R338 Other retention of urine: Secondary | ICD-10-CM

## 2019-06-08 LAB — BLADDER SCAN AMB NON-IMAGING: Scan Result: 106

## 2019-06-25 ENCOUNTER — Emergency Department: Payer: Medicare HMO

## 2019-06-25 ENCOUNTER — Encounter: Payer: Self-pay | Admitting: Emergency Medicine

## 2019-06-25 ENCOUNTER — Emergency Department
Admission: EM | Admit: 2019-06-25 | Discharge: 2019-06-25 | Disposition: A | Discharge status: Home / Self Care | Payer: Medicare HMO | Attending: Emergency Medicine | Admitting: Emergency Medicine

## 2019-06-25 ENCOUNTER — Other Ambulatory Visit: Payer: Self-pay

## 2019-06-25 DIAGNOSIS — R109 Unspecified abdominal pain: Secondary | ICD-10-CM | POA: Diagnosis present

## 2019-06-25 DIAGNOSIS — I10 Essential (primary) hypertension: Secondary | ICD-10-CM | POA: Insufficient documentation

## 2019-06-25 DIAGNOSIS — Z79899 Other long term (current) drug therapy: Secondary | ICD-10-CM | POA: Insufficient documentation

## 2019-06-25 DIAGNOSIS — J449 Chronic obstructive pulmonary disease, unspecified: Secondary | ICD-10-CM | POA: Diagnosis not present

## 2019-06-25 DIAGNOSIS — N200 Calculus of kidney: Secondary | ICD-10-CM | POA: Diagnosis not present

## 2019-06-25 DIAGNOSIS — Z7982 Long term (current) use of aspirin: Secondary | ICD-10-CM | POA: Insufficient documentation

## 2019-06-25 LAB — URINALYSIS, COMPLETE (UACMP) WITH MICROSCOPIC
Bacteria, UA: NONE SEEN
Bilirubin Urine: NEGATIVE
Glucose, UA: NEGATIVE mg/dL
Ketones, ur: NEGATIVE mg/dL
Nitrite: NEGATIVE
Protein, ur: 100 mg/dL — AB
RBC / HPF: >50 RBC/hpf — ABNORMAL HIGH (ref 0–5)
Specific Gravity, Urine: 1.015 (ref 1.005–1.030)
Squamous Epithelial / HPF: NONE SEEN (ref 0–5)
WBC, UA: >50 WBC/hpf — ABNORMAL HIGH (ref 0–5)
pH: 5 (ref 5.0–8.0)

## 2019-06-25 LAB — CBC
HCT: 27.5 % — ABNORMAL LOW (ref 39.0–52.0)
Hemoglobin: 8.9 g/dL — ABNORMAL LOW (ref 13.0–17.0)
MCH: 28.3 pg (ref 26.0–34.0)
MCHC: 32.4 g/dL (ref 30.0–36.0)
MCV: 87.6 fL (ref 80.0–100.0)
Platelets: 187 10*3/uL (ref 150–400)
RBC: 3.14 MIL/uL — ABNORMAL LOW (ref 4.22–5.81)
RDW: 18.1 % — ABNORMAL HIGH (ref 11.5–15.5)
WBC: 5.6 10*3/uL (ref 4.0–10.5)
nRBC: 0 % (ref 0.0–0.2)

## 2019-06-25 LAB — BASIC METABOLIC PANEL
Anion gap: 6 (ref 5–15)
BUN: 10 mg/dL (ref 8–23)
CO2: 22 mmol/L (ref 22–32)
Calcium: 8.3 mg/dL — ABNORMAL LOW (ref 8.9–10.3)
Chloride: 105 mmol/L (ref 98–111)
Creatinine, Ser: 1.05 mg/dL (ref 0.61–1.24)
GFR calc Af Amer: >60 mL/min (ref >60)
GFR calc non Af Amer: >60 mL/min (ref >60)
Glucose, Bld: 133 mg/dL — ABNORMAL HIGH (ref 70–99)
Potassium: 3.9 mmol/L (ref 3.5–5.1)
Sodium: 133 mmol/L — ABNORMAL LOW (ref 135–145)

## 2019-06-25 MED ORDER — TAMSULOSIN HCL 0.4 MG PO CAPS: 0.4000 mg | ORAL_CAPSULE | Freq: Every day | ORAL | 0 refills | Status: DC

## 2019-06-25 MED ORDER — OXYCODONE-ACETAMINOPHEN 5-325 MG PO TABS: 1.0000 | ORAL_TABLET | Freq: Four times a day (QID) | ORAL | 0 refills | Status: DC | PRN

## 2019-06-25 NOTE — ED Provider Notes (Signed)
[facility] Emergency Department Provider Note  ____________________________________________  Time seen: Approximately 2:05 PM  I have reviewed the triage vital signs and the nursing notes.   HISTORY  Chief Complaint Hematuria    HPI Marvin Ortega is a 69 y.o. male who presents the emergency department complaining of hematuria and right flank pain.  Patient states that 2 days ago he started to notice back pain and developed some mild hematuria.  Patient states that the pain has been moving around his right side and the hematuria became significant last night.  Patient denies any dysuria.  No fevers or chills, nausea vomiting, diarrhea or constipation.  Patient has a history of A. fib, BPH, COPD, GERD, hypertension, nephrolithiasis.        Past Medical History:  Diagnosis Date  . Anemia   . Atrial fibrillation ([place])   . Atrial fibrillation ([place])   . BPH (benign prostatic hyperplasia)   . COPD (chronic obstructive pulmonary disease) ([place])   . Dieulafoy lesion of stomach   . GERD (gastroesophageal reflux disease)    h/o  . GIB (gastrointestinal bleeding) 02/15/2018  . Hemorrhagic shock ([place])   . History of kidney stones   . Hypertension   . Pre-diabetes   . Sepsis ([place])   . Upper GI bleed     Patient Active Problem List   Diagnosis Date Noted  . S/P TURP 05/24/2019  . Acute renal failure (ARF) ([place]) 03/28/2019  . COPD (chronic obstructive pulmonary disease) ([place]) 03/28/2019  . Acidosis 03/28/2019  . Normocytic anemia 03/28/2019  . Bladder calculus 11/30/2018  . C. difficile colitis 11/25/2018  . Renal calculus 11/23/2018  . Ureteral calculi 10/29/2018  . Calculus of bladder 10/29/2018  . Nephrolithiasis 10/29/2018  . Gross hematuria 09/28/2018  . PAF (paroxysmal atrial fibrillation) ([place]) 02/25/2018  . Benign prostatic hyperplasia with lower urinary tract symptoms 12/08/2013  . Essential (primary) hypertension 12/08/2013  . AA  (alcohol abuse) 12/08/2013  . GERD (gastroesophageal reflux disease) 02/13/2011  . Hyperlipidemia 06/20/2010    Past Surgical History:  Procedure Laterality Date  . CYSTOSCOPY W/ RETROGRADES Right 11/23/2018   Procedure: CYSTOSCOPY WITH RETROGRADE PYELOGRAM;  Surgeon: Abbie Sons, MD;  Location: [facility] ORS;  Service: Urology;  Laterality: Right;  . CYSTOSCOPY WITH HOLMIUM LASER LITHOTRIPSY  11/30/2018   Procedure: CYSTOSCOPY WITH HOLMIUM LASER LITHOTRIPSY;  Surgeon: Abbie Sons, MD;  Location: [facility] ORS;  Service: Urology;;  . Consuela Mimes WITH LITHOLAPAXY N/A 11/23/2018   Procedure: CYSTOSCOPY WITH LITHOLAPAXY;  Surgeon: Abbie Sons, MD;  Location: [facility] ORS;  Service: Urology;  Laterality: N/A;  . CYSTOSCOPY WITH LITHOLAPAXY N/A 11/30/2018   Procedure: CYSTOSCOPY WITH LITHOLAPAXY;  Surgeon: Abbie Sons, MD;  Location: [facility] ORS;  Service: Urology;  Laterality: N/A;  . CYSTOSCOPY/URETEROSCOPY/HOLMIUM LASER/STENT PLACEMENT Right 11/23/2018   Procedure: CYSTOSCOPY/URETEROSCOPY/HOLMIUM LASER/STENT PLACEMENT;  Surgeon: Abbie Sons, MD;  Location: [facility] ORS;  Service: Urology;  Laterality: Right;  . ESOPHAGOGASTRODUODENOSCOPY N/A 02/15/2018   Procedure: ESOPHAGOGASTRODUODENOSCOPY (EGD);  Surgeon: Lin Landsman, MD;  Location[facility] ENDOSCOPY;  Service: Gastroenterology;  Laterality: N/A;  . TRANSURETHRAL RESECTION OF PROSTATE N/A 05/24/2019   Procedure: TRANSURETHRAL RESECTION OF THE PROSTATE (TURP);  Surgeon: Abbie Sons, MD;  Location: [facility] ORS;  Service: Urology;  Laterality: N/A;    Prior to Admission medications   Medication Sig Start Date End Date Taking? Authorizing Provider  aspirin EC 81 MG tablet Take 81 mg by mouth daily.    [provider]  finasteride (PROSCAR) 5 MG tablet Take 5 mg by mouth every morning.     [provider]  furosemide (LASIX) 40 MG tablet Take 40 mg by mouth daily.  04/21/19   [provider]   HYDROcodone-acetaminophen (NORCO/VICODIN) 5-325 MG tablet Take 1 tablet by mouth every 6 (six) hours as needed for moderate pain. 05/25/19   Michiel Cowboy A, PA-C  loratadine (CLARITIN) 10 MG tablet Take 10 mg by mouth daily.    [provider]  metoprolol tartrate (LOPRESSOR) 50 MG tablet Take one tablet every morning 05/25/19   McGowan, Carollee Herter A, PA-C  oxyCODONE-acetaminophen (PERCOCET/ROXICET) 5-325 MG tablet Take 1 tablet by mouth every 6 (six) hours as needed for severe pain. 06/25/19   Nyra Anspaugh, Delorise Royals, PA-C  pantoprazole (PROTONIX) 40 MG tablet Take 1 tablet (40 mg total) by mouth 2 (two) times daily. 02/19/18   Mayo, Allyn Kenner, MD  tamsulosin (FLOMAX) 0.4 MG CAPS capsule Take 1 capsule (0.4 mg total) by mouth daily. 06/25/19   Saqib Cazarez, Delorise Royals, PA-C  Thiamine HCl (VITAMIN B-1) 250 MG tablet Take 250 mg by mouth daily.    [provider]    Allergies Patient has no known allergies.  Family History  Problem Relation Age of Onset  . Heart disease Father   . Aneurysm Sister     Social History Social History   Tobacco Use  . Smoking status: Never Smoker  . Smokeless tobacco: Never Used  Substance Use Topics  . Alcohol use: Not Currently  . Drug use: Never     Review of Systems  Constitutional: No fever/chills Eyes: No visual changes. No discharge ENT: No upper respiratory complaints. Cardiovascular: no chest pain. Respiratory: no cough. No SOB. Gastrointestinal: No abdominal pain.  No nausea, no vomiting.  No diarrhea.  No constipation. Genitourinary: Negative for dysuria.  Positive for hematuria, right flank pain Musculoskeletal: Negative for musculoskeletal pain. Skin: Negative for rash, abrasions, lacerations, ecchymosis. Neurological: Negative for headaches, focal weakness or numbness. 10-point ROS otherwise negative.  ____________________________________________   PHYSICAL EXAM:  VITAL SIGNS: ED Triage Vitals  Enc Vitals Group      BP 06/25/19 1145 (!) 160/80     Pulse Rate 06/25/19 1145 72     Resp 06/25/19 1145 18     Temp 06/25/19 1145 98.3 F (36.8 C)     Temp Source 06/25/19 1145 Oral     SpO2 06/25/19 1145 100 %     Weight 06/25/19 1144 147 lb (66.7 kg)     Height 06/25/19 1144 5\' 6"  (1.676 m)     Head Circumference --      Peak Flow --      Pain Score 06/25/19 1146 2     Pain Loc --      Pain Edu? --      Excl. in GC? --      Constitutional: Alert and oriented. Well appearing and in no acute distress. Eyes: Conjunctivae are normal. PERRL. EOMI. Head: Atraumatic. ENT:      Ears:       Nose: No congestion/rhinnorhea.      Mouth/Throat: Mucous membranes are moist.  Neck: No stridor.    Cardiovascular: Normal rate, regular rhythm. Normal S1 and S2.  Good peripheral circulation. Respiratory: Normal respiratory effort without tachypnea or retractions. Lungs CTAB. Good air entry to the bases with no decreased or absent breath sounds. Gastrointestinal: Bowel sounds 4 quadrants. Soft and nontender to palpation. No guarding or rigidity. No palpable masses. No  distention. No CVA tenderness the patient does have some mild tenderness extending from the right flank region into the right side on palpation. Musculoskeletal: Full range of motion to all extremities. No gross deformities appreciated. Neurologic:  Normal speech and language. No gross focal neurologic deficits are appreciated.  Skin:  Skin is warm, dry and intact. No rash noted. Psychiatric: Mood and affect are normal. Speech and behavior are normal. Patient exhibits appropriate insight and judgement.   ____________________________________________   LABS (all labs ordered are listed, but only abnormal results are displayed)  Labs Reviewed  URINALYSIS, COMPLETE (UACMP) WITH MICROSCOPIC - Abnormal; Notable for the following components:      Result Value   Color, Urine YELLOW (*)    APPearance TURBID (*)    Hgb urine dipstick MODERATE (*)     Protein, ur 100 (*)    Leukocytes,Ua LARGE (*)    RBC / HPF >50 (*)    WBC, UA >50 (*)    All other components within normal limits  BASIC METABOLIC PANEL - Abnormal; Notable for the following components:   Sodium 133 (*)    Glucose, Bld 133 (*)    Calcium 8.3 (*)    All other components within normal limits  CBC - Abnormal; Notable for the following components:   RBC 3.14 (*)    Hemoglobin 8.9 (*)    HCT 27.5 (*)    RDW 18.1 (*)    All other components within normal limits  URINE CULTURE   ____________________________________________  EKG   ____________________________________________  RADIOLOGY I personally viewed and evaluated these images as part of my medical decision making, as well as reviewing the written report by the radiologist.  CT Renal Stone Study  Result Date: 06/25/2019 CLINICAL DATA:  Right flank pain. Hematuria. EXAM: CT ABDOMEN AND PELVIS WITHOUT CONTRAST TECHNIQUE: Multidetector CT imaging of the abdomen and pelvis was performed following the standard protocol without IV contrast. COMPARISON:  Renal ultrasound 03/28/2019 and CT scan from 10/07/2018 FINDINGS: Lower chest: Paraesophageal varices. Small type 1 hiatal hernia. Low-density blood pool suggests anemia. Hepatobiliary: Hepatic cirrhosis. Multiple gallstones in the gallbladder. Pancreas: Unremarkable Spleen: Unremarkable Adrenals/Urinary Tract: Left kidney lower pole 2.2 cm staghorn calculus. Separate 3 mm left kidney upper pole and 1-2 mm left mid kidney nonobstructive renal calculi. Exophytic 1.4 by 1.3 cm fluid density lesion from the left kidney lower pole is likely a cyst and similar to prior. Right mid kidney 0.9 cm hypodense lesion is similar to prior and likewise most likely to be a cyst. No ureteral calculus, hydronephrosis, or hydroureter. Nondistended thick-walled urinary bladder. Low-density transition to the prostate gland, compatible with transurethral resection of the prostate. The previous 4.8  cm bladder stone shown on 10/07/2018 is no longer present. Stomach/Bowel: Scattered colonic diverticulosis. Normal appendix. Vascular/Lymphatic: Aortoiliac atherosclerotic vascular disease. Reproductive: Soft tissue lobularity between the posterior prostate gland and the anterior rectum, presumably due to prostate nodularity for example on image 73/2. Other: No supplemental non-categorized findings. Musculoskeletal: Indirect right inguinal hernia contains adipose tissue. Old healed right lower rib fractures. Bridging spurring of the right sacroiliac joint. Right foraminal impingement at L3-4 due to degenerative disc disease. IMPRESSION: 1. Left kidney lower pole 2.2 cm staghorn calculus. Separate 3 mm left kidney upper pole and 1-2 mm left mid kidney nonobstructive renal calculi. 2. Soft tissue lobularity between the posterior prostate gland and the anterior rectum, presumably due to prostate nodularity. 3. Other imaging findings of potential clinical significance: Hepatic cirrhosis. Paraesophageal varices. Small  type 1 hiatal hernia. Cholelithiasis. Scattered colonic diverticulosis. Indirect right inguinal hernia contains adipose tissue. Right foraminal impingement at L3-4 due to degenerative disc disease. Low-density blood pool suggests anemia. 4. Aortic atherosclerosis. Aortic Atherosclerosis (ICD10-I70.0). Electronically Signed   By: Gaylyn Rong M.D.   On: 06/25/2019 14:29    ____________________________________________    PROCEDURES  Procedure(s) performed:    Procedures    Medications - No data to display   ____________________________________________   INITIAL IMPRESSION / ASSESSMENT AND PLAN / ED COURSE  Pertinent labs & imaging results that were available during my care of the patient were reviewed by me and considered in my medical decision making (see chart for details).  Review of the [place] CSRS was performed in accordance of the [facility] prior to dispensing any controlled  drugs.           Patient's diagnosis is consistent with nephrolithiasis.  Patient presented to the emergency department complaining of hematuria x2 days.  Patient was having some flank pain as well.  History of kidney stones in the past.  CT reveals multiple kidney stones in the left kidney with no evidence of hydronephrosis or hydroureter.  Patient had some white blood cells but no nitrites or bacteria, I do not suspect that this is an infected kidney stone as patient has no dysuria, leukocytosis, fever.  Patient will be encouraged to drink plenty of fluids, given pain medication and Flomax.  Follow-up with urology for management.  Return precautions are discussed with the patient..Patient is given ED precautions to return to the ED for any worsening or new symptoms.     ____________________________________________  FINAL CLINICAL IMPRESSION(S) / ED DIAGNOSES  Final diagnoses:  Nephrolithiasis      NEW MEDICATIONS STARTED DURING THIS VISIT:  ED Discharge Orders         Ordered    oxyCODONE-acetaminophen (PERCOCET/ROXICET) 5-325 MG tablet  Every 6 hours PRN     06/25/19 1730    tamsulosin (FLOMAX) 0.4 MG CAPS capsule  Daily     06/25/19 1730              This chart was dictated using voice recognition software/Dragon. Despite best efforts to proofread, errors can occur which can change the meaning. Any change was purely unintentional.    Racheal Patches, PA-C 06/25/19 1731    Minna Antis, MD 06/26/19 1335

## 2019-06-25 NOTE — ED Notes (Signed)
First Nurse Note: Pt to ED via POV c/o hematuria since last night. Pt is in NAD.

## 2019-06-25 NOTE — ED Triage Notes (Addendum)
Pt arrived via POV with reports of blood in urine, pt states he first noticed blood in urine at 8pm last night and last noted around 4am this morning.  Pt reports urine was dark red and had "lumps" in it.  Pt c/o pain when urinating and c/o lower abdominal pain at this time.   Pt had a TURP on 3/16.

## 2019-06-25 NOTE — ED Notes (Signed)
Pt verbalized understanding of discharge instructions. NAD at this time. 

## 2019-06-28 LAB — URINE CULTURE: Culture: >=100000 — AB

## 2019-06-29 NOTE — Consult Note (Signed)
Contacted patient and he states he is doing well. Urine culture started growing serratia. Discussed case with Dr. Erma Heritage. Informed patient we will be calling in antibiotics for him. Concern for pyelo due to compliant for right flank pain. Called in TMP/SMX DS BID for 7 days to [facility] on [address]. Patient aware of plan.   Thanks,   Paschal Dopp, PharmD, BCPS

## 2019-09-19 ENCOUNTER — Other Ambulatory Visit: Payer: Self-pay | Admitting: Urology

## 2019-12-08 ENCOUNTER — Ambulatory Visit: Payer: Self-pay | Admitting: Urology

## 2019-12-14 ENCOUNTER — Ambulatory Visit (INDEPENDENT_AMBULATORY_CARE_PROVIDER_SITE_OTHER): Payer: Medicare HMO | Admitting: Urology

## 2019-12-14 ENCOUNTER — Other Ambulatory Visit: Payer: Self-pay

## 2019-12-14 ENCOUNTER — Encounter: Payer: Self-pay | Admitting: Urology

## 2019-12-14 VITALS — BP 143/87 | HR 75 | Ht 66.0 in | Wt 156.5 lb

## 2019-12-14 DIAGNOSIS — R338 Other retention of urine: Secondary | ICD-10-CM

## 2019-12-14 DIAGNOSIS — N401 Enlarged prostate with lower urinary tract symptoms: Secondary | ICD-10-CM

## 2019-12-14 DIAGNOSIS — N400 Enlarged prostate without lower urinary tract symptoms: Secondary | ICD-10-CM | POA: Diagnosis not present

## 2019-12-14 DIAGNOSIS — Z9889 Other specified postprocedural states: Secondary | ICD-10-CM

## 2019-12-14 LAB — BLADDER SCAN AMB NON-IMAGING: Scan Result: 34

## 2019-12-14 NOTE — Progress Notes (Signed)
12/14/2019 11:37 AM   Marvin Ortega 10-06-1950 [id number]  Referring provider: Kandyce Rud, MD [phone] [address] [facility] [place],  [place] [address]  Chief Complaint  Patient presents with  . Benign Prostatic Hypertrophy    HPI: 69 y.o. male presents for 79-month follow-up.   Status post staged endoscopic procedures for ureteroscopic stone removal/cystolitholapaxy 11/2018  TURP 05/24/2019 for urinary retention  PVR 1 month postop follow-up was 106  States he is voiding without problems; no bothersome frequency, urgency; voiding with good stream  Complaining of shortness of breath since his procedure   PMH: Past Medical History:  Diagnosis Date  . Anemia   . Atrial fibrillation ([facility])   . Atrial fibrillation ([facility])   . BPH (benign prostatic hyperplasia)   . COPD (chronic obstructive pulmonary disease) ([facility])   . Dieulafoy lesion of stomach   . GERD (gastroesophageal reflux disease)    h/o  . GIB (gastrointestinal bleeding) 02/15/2018  . Hemorrhagic shock ([facility])   . History of kidney stones   . Hypertension   . Pre-diabetes   . Sepsis ([facility])   . Upper GI bleed     Surgical History: Past Surgical History:  Procedure Laterality Date  . CYSTOSCOPY W/ RETROGRADES Right 11/23/2018   Procedure: CYSTOSCOPY WITH RETROGRADE PYELOGRAM;  Surgeon: Riki Altes, MD;  Location: [facility] ORS;  Service: Urology;  Laterality: Right;  . CYSTOSCOPY WITH HOLMIUM LASER LITHOTRIPSY  11/30/2018   Procedure: CYSTOSCOPY WITH HOLMIUM LASER LITHOTRIPSY;  Surgeon: Riki Altes, MD;  Location: [facility] ORS;  Service: Urology;;  . Bluford Kaufmann WITH LITHOLAPAXY N/A 11/23/2018   Procedure: CYSTOSCOPY WITH LITHOLAPAXY;  Surgeon: Riki Altes, MD;  Location: [facility] ORS;  Service: Urology;  Laterality: N/A;  . CYSTOSCOPY WITH LITHOLAPAXY N/A 11/30/2018   Procedure: CYSTOSCOPY WITH LITHOLAPAXY;  Surgeon: Riki Altes, MD;  Location: [facility] ORS;  Service:  Urology;  Laterality: N/A;  . CYSTOSCOPY/URETEROSCOPY/HOLMIUM LASER/STENT PLACEMENT Right 11/23/2018   Procedure: CYSTOSCOPY/URETEROSCOPY/HOLMIUM LASER/STENT PLACEMENT;  Surgeon: Riki Altes, MD;  Location: [facility] ORS;  Service: Urology;  Laterality: Right;  . ESOPHAGOGASTRODUODENOSCOPY N/A 02/15/2018   Procedure: ESOPHAGOGASTRODUODENOSCOPY (EGD);  Surgeon: Toney Reil, MD;  Location: [facility] ENDOSCOPY;  Service: Gastroenterology;  Laterality: N/A;  . TRANSURETHRAL RESECTION OF PROSTATE N/A 05/24/2019   Procedure: TRANSURETHRAL RESECTION OF THE PROSTATE (TURP);  Surgeon: Riki Altes, MD;  Location: [facility] ORS;  Service: Urology;  Laterality: N/A;    Home Medications:  Allergies as of 12/14/2019   No Known Allergies     Medication List       Accurate as of December 14, 2019 11:37 AM. If you have any questions, ask your nurse or doctor.        STOP taking these medications   furosemide 40 MG tablet Commonly known as: LASIX Stopped by: Riki Altes, MD   HYDROcodone-acetaminophen 5-325 MG tablet Commonly known as: NORCO/VICODIN Stopped by: Riki Altes, MD   oxyCODONE-acetaminophen 5-325 MG tablet Commonly known as: PERCOCET/ROXICET Stopped by: Riki Altes, MD   tamsulosin 0.4 MG Caps capsule Commonly known as: FLOMAX Stopped by: Riki Altes, MD     TAKE these medications   aspirin EC 81 MG tablet Take 81 mg by mouth daily.   finasteride 5 MG tablet Commonly known as: PROSCAR Take 5 mg by mouth every morning.   loratadine 10 MG tablet Commonly known as: CLARITIN Take 10 mg by mouth daily.   metoprolol tartrate 50  MG tablet Commonly known as: LOPRESSOR Take one tablet every morning   pantoprazole 40 MG tablet Commonly known as: PROTONIX Take 1 tablet (40 mg total) by mouth 2 (two) times daily.   vitamin B-1 250 MG tablet Take 250 mg by mouth daily.       Allergies: No Known Allergies  Family History: Family History  Problem Relation  Age of Onset  . Heart disease Father   . Aneurysm Sister     Social History:  reports that he has never smoked. He has never used smokeless tobacco. He reports previous alcohol use. He reports that he does not use drugs.   Physical Exam: BP (!) 143/87   Pulse 75   Ht 5\' 6"  (1.676 m)   Wt 156 lb 8 oz (71 kg)   BMI 25.26 kg/m   Constitutional:  Alert and oriented, No acute distress. HEENT: [place] AT, moist mucus membranes.  Trachea midline, no masses. Cardiovascular: No clubbing, cyanosis, or edema. Respiratory: Normal respiratory effort, no increased work of breathing.   Assessment & Plan:    1.  BPH status post TURP  Bladder scan PVR today 34 mL  Complaining of chronic shortness of breath and would recommend PCP follow-up  Follow-up office visit 1 year   , MD  [facility] [address] [place], [place] [phone]

## 2019-12-18 ENCOUNTER — Encounter: Payer: Self-pay | Admitting: Urology

## 2020-01-09 DIAGNOSIS — I509 Heart failure, unspecified: Secondary | ICD-10-CM | POA: Diagnosis not present

## 2020-01-09 DIAGNOSIS — J449 Chronic obstructive pulmonary disease, unspecified: Secondary | ICD-10-CM | POA: Diagnosis not present

## 2020-01-09 DIAGNOSIS — I10 Essential (primary) hypertension: Secondary | ICD-10-CM | POA: Diagnosis not present

## 2020-01-09 DIAGNOSIS — Z23 Encounter for immunization: Secondary | ICD-10-CM | POA: Diagnosis not present

## 2020-01-09 DIAGNOSIS — Z0000 Encounter for general adult medical examination without abnormal findings: Secondary | ICD-10-CM | POA: Diagnosis not present

## 2020-01-09 DIAGNOSIS — Z1331 Encounter for screening for depression: Secondary | ICD-10-CM | POA: Diagnosis not present

## 2020-02-20 DIAGNOSIS — J449 Chronic obstructive pulmonary disease, unspecified: Secondary | ICD-10-CM | POA: Diagnosis not present

## 2020-02-20 DIAGNOSIS — D6869 Other thrombophilia: Secondary | ICD-10-CM | POA: Diagnosis not present

## 2020-02-20 DIAGNOSIS — Z7982 Long term (current) use of aspirin: Secondary | ICD-10-CM | POA: Diagnosis not present

## 2020-02-20 DIAGNOSIS — I509 Heart failure, unspecified: Secondary | ICD-10-CM | POA: Diagnosis not present

## 2020-02-20 DIAGNOSIS — I110 Hypertensive heart disease with heart failure: Secondary | ICD-10-CM | POA: Diagnosis not present

## 2020-02-20 DIAGNOSIS — K219 Gastro-esophageal reflux disease without esophagitis: Secondary | ICD-10-CM | POA: Diagnosis not present

## 2020-04-27 DIAGNOSIS — Z7982 Long term (current) use of aspirin: Secondary | ICD-10-CM | POA: Diagnosis not present

## 2020-04-27 DIAGNOSIS — J449 Chronic obstructive pulmonary disease, unspecified: Secondary | ICD-10-CM | POA: Diagnosis not present

## 2020-04-27 DIAGNOSIS — D6869 Other thrombophilia: Secondary | ICD-10-CM | POA: Diagnosis not present

## 2020-04-27 DIAGNOSIS — F1021 Alcohol dependence, in remission: Secondary | ICD-10-CM | POA: Diagnosis not present

## 2020-04-27 DIAGNOSIS — I509 Heart failure, unspecified: Secondary | ICD-10-CM | POA: Diagnosis not present

## 2020-04-27 DIAGNOSIS — I480 Paroxysmal atrial fibrillation: Secondary | ICD-10-CM | POA: Diagnosis not present

## 2020-06-02 ENCOUNTER — Emergency Department: Payer: Medicare HMO

## 2020-06-02 ENCOUNTER — Other Ambulatory Visit: Payer: Self-pay

## 2020-06-02 ENCOUNTER — Emergency Department
Admission: EM | Admit: 2020-06-02 | Discharge: 2020-06-02 | Disposition: A | Discharge status: Home / Self Care | Payer: Medicare HMO | Attending: Emergency Medicine | Admitting: Emergency Medicine

## 2020-06-02 ENCOUNTER — Encounter: Payer: Self-pay | Admitting: Emergency Medicine

## 2020-06-02 DIAGNOSIS — R490 Dysphonia: Secondary | ICD-10-CM | POA: Diagnosis not present

## 2020-06-02 DIAGNOSIS — B370 Candidal stomatitis: Secondary | ICD-10-CM | POA: Diagnosis not present

## 2020-06-02 DIAGNOSIS — I10 Essential (primary) hypertension: Secondary | ICD-10-CM | POA: Diagnosis not present

## 2020-06-02 DIAGNOSIS — Z79899 Other long term (current) drug therapy: Secondary | ICD-10-CM | POA: Insufficient documentation

## 2020-06-02 DIAGNOSIS — J449 Chronic obstructive pulmonary disease, unspecified: Secondary | ICD-10-CM | POA: Diagnosis not present

## 2020-06-02 DIAGNOSIS — Z7982 Long term (current) use of aspirin: Secondary | ICD-10-CM | POA: Insufficient documentation

## 2020-06-02 DIAGNOSIS — R0602 Shortness of breath: Secondary | ICD-10-CM | POA: Insufficient documentation

## 2020-06-02 LAB — CBC WITH DIFFERENTIAL/PLATELET
Abs Immature Granulocytes: 0.01 10*3/uL (ref 0.00–0.07)
Basophils Absolute: 0.1 10*3/uL (ref 0.0–0.1)
Basophils Relative: 1 %
Eosinophils Absolute: 0.3 10*3/uL (ref 0.0–0.5)
Eosinophils Relative: 5 %
HCT: 30.4 % — ABNORMAL LOW (ref 39.0–52.0)
Hemoglobin: 9.5 g/dL — ABNORMAL LOW (ref 13.0–17.0)
Immature Granulocytes: 0 %
Lymphocytes Relative: 19 %
Lymphs Abs: 1 10*3/uL (ref 0.7–4.0)
MCH: 27 pg (ref 26.0–34.0)
MCHC: 31.3 g/dL (ref 30.0–36.0)
MCV: 86.4 fL (ref 80.0–100.0)
Monocytes Absolute: 0.5 10*3/uL (ref 0.1–1.0)
Monocytes Relative: 8 %
Neutro Abs: 3.6 10*3/uL (ref 1.7–7.7)
Neutrophils Relative %: 67 %
Platelets: 221 10*3/uL (ref 150–400)
RBC: 3.52 MIL/uL — ABNORMAL LOW (ref 4.22–5.81)
RDW: 18.8 % — ABNORMAL HIGH (ref 11.5–15.5)
WBC: 5.4 10*3/uL (ref 4.0–10.5)
nRBC: 0 % (ref 0.0–0.2)

## 2020-06-02 LAB — BASIC METABOLIC PANEL
Anion gap: 9 (ref 5–15)
BUN: 9 mg/dL (ref 8–23)
CO2: 20 mmol/L — ABNORMAL LOW (ref 22–32)
Calcium: 8.4 mg/dL — ABNORMAL LOW (ref 8.9–10.3)
Chloride: 108 mmol/L (ref 98–111)
Creatinine, Ser: 1.48 mg/dL — ABNORMAL HIGH (ref 0.61–1.24)
GFR, Estimated: 50 mL/min — ABNORMAL LOW (ref >60)
Glucose, Bld: 152 mg/dL — ABNORMAL HIGH (ref 70–99)
Potassium: 4.3 mmol/L (ref 3.5–5.1)
Sodium: 137 mmol/L (ref 135–145)

## 2020-06-02 MED ORDER — FLUCONAZOLE 100 MG PO TABS
200.0000 mg | ORAL_TABLET | Freq: Once | ORAL | Status: AC
  Administered 2020-06-02: 200 mg via ORAL
  Filled 2020-06-02: qty 2

## 2020-06-02 MED ORDER — IOHEXOL 300 MG/ML  SOLN
75.0000 mL | Freq: Once | INTRAMUSCULAR | Status: AC | PRN
  Administered 2020-06-02: 75 mL via INTRAVENOUS

## 2020-06-02 MED ORDER — PREDNISONE 20 MG PO TABS
40.0000 mg | ORAL_TABLET | Freq: Once | ORAL | Status: AC
  Administered 2020-06-02: 40 mg via ORAL
  Filled 2020-06-02: qty 2

## 2020-06-02 MED ORDER — FLUCONAZOLE 100 MG PO TABS: 100.0000 mg | ORAL_TABLET | Freq: Every day | ORAL | 0 refills | Status: AC

## 2020-06-02 MED ORDER — PREDNISONE 50 MG PO TABS: ORAL_TABLET | ORAL | 0 refills | Status: DC

## 2020-06-02 NOTE — ED Notes (Signed)
RN in room to check on pt, pt was hooked unhooked to go to the restroom. Pt ambulatory to restroom without difficulty or distress. Pt was given warm blanket and vital signed updated. Pt denies any other needs at this time.

## 2020-06-02 NOTE — ED Notes (Signed)
Pt returned from radiology.

## 2020-06-02 NOTE — ED Notes (Signed)
Pt taken to xray 

## 2020-06-02 NOTE — ED Triage Notes (Signed)
Pt to ED via [facility] from home for shortness of breath. Per pt he states that over the last couple of weeks his voice has been changing. Pt states that he woke up this morning short of breath. Pt used combivent inhaler that he was given with no relief, EMS reports that pt had audible wheezing when ambulating to the ambulance but that his lungs fields were clear on auscultation. Pt arrives to the ED in NAD, sitting upright on the stretcher, pt was able to ambulate from the EMS stretcher to the ED stretcher without difficulty. Pt is able to talk in complete sentences at this time.

## 2020-06-02 NOTE — ED Provider Notes (Signed)
[facility] Emergency Department Provider Note  ____________________________________________   Event Date/Time   First MD Initiated Contact with Patient 06/02/20 1032     (approximate)  I have reviewed the triage vital signs and the nursing notes.   HISTORY  Chief Complaint Shortness of Breath   HPI Marvin Ortega is a 70 y.o. male   reports here for evaluation of shortness of breath.  Patient reports that he has had some hoarseness of voice for the last couple weeks, and is also noticed that his tongue feels sore and cracked.  This is new for him in the last few weeks, but he also reports he has been having some bit of shortness of breath feeling for about a year.  Reports that every day relatively unchanging.  He did use his inhaler today did not seem to help symptoms much so he called 911.  Reports that he feels some okay right now his breathing feels normal but reports his tongue just feels cracked and sore for the last several days.  He also reports his throat just feels a little bit hoarse  No fevers or chills.  No nausea or vomiting.  No chest pain.  Used to drink alcohol quite a bit on a regular basis, but has decreased this considerably over the last several months  Past Medical History:  Diagnosis Date  . Anemia   . Atrial fibrillation (HCC)   . Atrial fibrillation (HCC)   . BPH (benign prostatic hyperplasia)   . COPD (chronic obstructive pulmonary disease) (HCC)   . Dieulafoy lesion of stomach   . GERD (gastroesophageal reflux disease)    h/o  . GIB (gastrointestinal bleeding) 02/15/2018  . Hemorrhagic shock (HCC)   . History of kidney stones   . Hypertension   . Pre-diabetes   . Sepsis (HCC)   . Upper GI bleed     Patient Active Problem List   Diagnosis Date Noted  . S/P TURP 05/24/2019  . Acute renal failure (ARF) (HCC) 03/28/2019  . COPD (chronic obstructive pulmonary disease) (HCC) 03/28/2019  . Acidosis 03/28/2019  .  Normocytic anemia 03/28/2019  . Bladder calculus 11/30/2018  . C. difficile colitis 11/25/2018  . Renal calculus 11/23/2018  . Ureteral calculi 10/29/2018  . Calculus of bladder 10/29/2018  . Nephrolithiasis 10/29/2018  . Gross hematuria 09/28/2018  . PAF (paroxysmal atrial fibrillation) (HCC) 02/25/2018  . Benign prostatic hyperplasia with lower urinary tract symptoms 12/08/2013  . Essential (primary) hypertension 12/08/2013  . AA (alcohol abuse) 12/08/2013  . GERD (gastroesophageal reflux disease) 02/13/2011  . Hyperlipidemia 06/20/2010    Past Surgical History:  Procedure Laterality Date  . CYSTOSCOPY W/ RETROGRADES Right 11/23/2018   Procedure: CYSTOSCOPY WITH RETROGRADE PYELOGRAM;  Surgeon: Riki Altes, MD;  Location: [facility] ORS;  Service: Urology;  Laterality: Right;  . CYSTOSCOPY WITH HOLMIUM LASER LITHOTRIPSY  11/30/2018   Procedure: CYSTOSCOPY WITH HOLMIUM LASER LITHOTRIPSY;  Surgeon: Riki Altes, MD;  Location: [facility] ORS;  Service: Urology;;  . Bluford Kaufmann WITH LITHOLAPAXY N/A 11/23/2018   Procedure: CYSTOSCOPY WITH LITHOLAPAXY;  Surgeon: Riki Altes, MD;  Location: [facility] ORS;  Service: Urology;  Laterality: N/A;  . CYSTOSCOPY WITH LITHOLAPAXY N/A 11/30/2018   Procedure: CYSTOSCOPY WITH LITHOLAPAXY;  Surgeon: Riki Altes, MD;  Location: [facility] ORS;  Service: Urology;  Laterality: N/A;  . CYSTOSCOPY/URETEROSCOPY/HOLMIUM LASER/STENT PLACEMENT Right 11/23/2018   Procedure: CYSTOSCOPY/URETEROSCOPY/HOLMIUM LASER/STENT PLACEMENT;  Surgeon: Riki Altes, MD;  Location: [facility] ORS;  Service: Urology;  Laterality: Right;  . ESOPHAGOGASTRODUODENOSCOPY N/A 02/15/2018   Procedure: ESOPHAGOGASTRODUODENOSCOPY (EGD);  Surgeon: Toney Reil, MD;  Location: [facility] ENDOSCOPY;  Service: Gastroenterology;  Laterality: N/A;  . TRANSURETHRAL RESECTION OF PROSTATE N/A 05/24/2019   Procedure: TRANSURETHRAL RESECTION OF THE PROSTATE (TURP);  Surgeon: Riki Altes, MD;  Location: [facility]  ORS;  Service: Urology;  Laterality: N/A;    Prior to Admission medications   Medication Sig Start Date End Date Taking? Authorizing Provider  fluconazole (DIFLUCAN) 100 MG tablet Take 1 tablet (100 mg total) by mouth daily for 6 days. 06/02/20 06/08/20 Yes Sharyn Creamer, MD  predniSONE (DELTASONE) 50 MG tablet 1 tab by mouth daily 06/02/20  Yes Sharyn Creamer, MD  aspirin EC 81 MG tablet Take 81 mg by mouth daily.    [provider]  finasteride (PROSCAR) 5 MG tablet Take 5 mg by mouth every morning.     [provider]  loratadine (CLARITIN) 10 MG tablet Take 10 mg by mouth daily. Patient not taking: Reported on 12/14/2019    [provider]  metoprolol tartrate (LOPRESSOR) 50 MG tablet Take one tablet every morning 05/25/19   McGowan, Carollee Herter A, PA-C  pantoprazole (PROTONIX) 40 MG tablet Take 1 tablet (40 mg total) by mouth 2 (two) times daily. 02/19/18   Mayo, Allyn Kenner, MD  Thiamine HCl (VITAMIN B-1) 250 MG tablet Take 250 mg by mouth daily.    [provider]    Allergies Patient has no known allergies.  Family History  Problem Relation Age of Onset  . Heart disease Father   . Aneurysm Sister     Social History Social History   Tobacco Use  . Smoking status: Never Smoker  . Smokeless tobacco: Never Used  Vaping Use  . Vaping Use: Never used  Substance Use Topics  . Alcohol use: Not Currently  . Drug use: Never    Review of Systems Constitutional: No fever/chills Eyes: No visual changes. ENT: See HPI Cardiovascular: Denies chest pain. Respiratory: Denies shortness of breath that is new, reports remedies been feeling some shortness of breath now for about a year. Gastrointestinal: No abdominal pain.   Genitourinary: Negative for dysuria. Skin: Negative for rash. Neurological: Negative for headaches or weakness.    ____________________________________________   PHYSICAL EXAM:  VITAL SIGNS: ED Triage Vitals  Enc Vitals Group     BP  06/02/20 1031 (!) 141/99     Pulse Rate 06/02/20 1031 (!) 101     Resp 06/02/20 1031 18     Temp 06/02/20 1031 98.7 F (37.1 C)     Temp src --      SpO2 06/02/20 1026 100 %     Weight 06/02/20 1032 160 lb (72.6 kg)     Height 06/02/20 1032 5\' 6"  (1.676 m)     Head Circumference --      Peak Flow --      Pain Score 06/02/20 1031 0     Pain Loc --      Pain Edu? --      Excl. in GC? --     Constitutional: Alert and oriented. Well appearing and in no acute distress.  He does not appear in any distress.  Normal oxygen saturation on room air speaks in full and clear sentences Eyes: Conjunctivae are normal. Head: Atraumatic. Nose: No congestion/rhinnorhea. Mouth/Throat: Mucous membranes are moist.  His tongue appears somewhat cobblestone in nature, and he exhibits a somewhat brownish relatively adherent plaque over the front of  the tongue.  This appears most consistent with thrush Neck: No stridor.  Cardiovascular: Normal rate, regular rhythm. Grossly normal heart sounds.  Good peripheral circulation. Respiratory: Normal respiratory effort.  No retractions. Lungs CTAB.  Ambulates without difficulty. Gastrointestinal: Soft and nontender. No distention. Musculoskeletal: No lower extremity tenderness nor edema. Neurologic:  Normal speech and language. No gross focal neurologic deficits are appreciated.  Skin:  Skin is warm, dry and intact. No rash noted. Psychiatric: Mood and affect are normal. Speech and behavior are normal.  Of note, I reviewed with primary nurse, as he had a couple of vital sign checks that seem quite spurious with tachypnea and hypoxia, nursing advises they are not sure why these were entered.  Primary nurse Marylene Land reports she was not aware of any such event occurring for this patient with a saturation of 88% and respiratory rate of 29.  I suspect this may have been entered in error ____________________________________________   LABS (all labs ordered are listed, but  only abnormal results are displayed)  Labs Reviewed  CBC WITH DIFFERENTIAL/PLATELET - Abnormal; Notable for the following components:      Result Value   RBC 3.52 (*)    Hemoglobin 9.5 (*)    HCT 30.4 (*)    RDW 18.8 (*)    All other components within normal limits  BASIC METABOLIC PANEL - Abnormal; Notable for the following components:   CO2 20 (*)    Glucose, Bld 152 (*)    Creatinine, Ser 1.48 (*)    Calcium 8.4 (*)    GFR, Estimated 50 (*)    All other components within normal limits   ____________________________________________  EKG  Reviewed entered by me at 1030 Heart rate 100 QRS 85 QTc 4 8 Sinus tachycardia, probable LVH.  Repolarization abnormality.  No noted acute ischemia.  This appears similar in morphology to March 29, 2019 ____________________________________________  RADIOLOGY  DG Chest 2 View  Result Date: 06/02/2020 CLINICAL DATA:  Shortness of breath EXAM: CHEST - 2 VIEW COMPARISON:  03/30/2019 FINDINGS: The heart size is normal. Tortuous thoracic aorta. Well-circumscribed nodule measuring approximately 2.0 cm projects over the left upper lobe on frontal view and evidently within the posterior soft tissues on lateral view; this is not seen on prior radiographs. The visualized skeletal structures are unremarkable. IMPRESSION: 1. No acute abnormality of the lungs. 2. Well-circumscribed nodule measuring approximately 2.0 cm projects over the left upper lobe on frontal view and evidently within the posterior soft tissues of lateral view; this is not seen on prior radiographs. Correlate for palpable abnormality overlying the left scapula; consider CT of the chest to evaluate suspected pulmonary nodule on a nonemergent basis. Electronically Signed   By: Lauralyn Primes M.D.   On: 06/02/2020 11:32   CT Soft Tissue Neck W Contrast  Result Date: 06/02/2020 CLINICAL DATA:  Hoarseness.  Oral thrush EXAM: CT NECK WITH CONTRAST TECHNIQUE: Multidetector CT imaging of the  neck was performed using the standard protocol following the bolus administration of intravenous contrast. CONTRAST:  66mL OMNIPAQUE IOHEXOL 300 MG/ML  SOLN COMPARISON:  None. FINDINGS: Pharynx and larynx: No evidence of mass or inflammation. Salivary glands: No inflammation, mass, or stone. Thyroid: Normal. Lymph nodes: None enlarged or abnormal density. Vascular: Tortuous vessels.  No acute finding. Limited intracranial: Negative Visualized orbits: Negative Mastoids and visualized paranasal sinuses: Clear Skeleton: No acute or aggressive finding Upper chest: Emphysematous spaces. IMPRESSION: No explanation for symptoms. No evidence of mucosal inflammation or mass. Electronically Signed  By: Marnee SpringJonathon  Watts M.D.   On: 06/02/2020 11:42    Chest x-ray negative for acute finding.  I did discuss with the patient that there is what appears to be a possible nodule and recommend he get a CT scan and inform his primary doctor about this, he advises that he is actually been told about this nodule he thinks previous but will mention it to Dr. Madelin RearBaboff.  CT neck reassuring negative for acute finding. ____________________________________________   PROCEDURES  Procedure(s) performed: None  Procedures  Critical Care performed: No  ____________________________________________   INITIAL IMPRESSION / ASSESSMENT AND PLAN / ED COURSE  Pertinent labs & imaging results that were available during my care of the patient were reviewed by me and considered in my medical decision making (see chart for details).   Patient presents for concerns of hoarseness of voice and some shortness of breath, hip endorses that this is been an ongoing issue now though for approximately a year.  He denies that there is been an acute change other than he is noticed his tongue is cracked and feels inflamed.  This is been also ongoing now he says for a couple months.  He is a somewhat poor historian, but exam does demonstrate evidence of  thrush, and this combined with his feeling of hoarseness and a reassuring CT scan I suspect likely he may be suffering from candidiasis oral and possibly esophageal.  We will treat this with fluconazole, advised follow-up with ENT specialist as well as his primary doctor.  In addition EMS reports the patient was using a inhaler and was wheezing at 1 point during their care, however this is resolved he is ambulatory he has no wheezing he is not having any ongoing shortness of breath or hypoxia.  Is able to ambulate back and forth the nurses desk reporting that his shortness of breath is present, mild but the same as he is experienced for the last year.  We will trial a short Dors of prednisone in the event this does represent something such as COPD, but overall I feel this likely represents some sort of a chronic illness and further work-up through his primary care would appear appropriate.  No chest pain.  No fever.  Reassuring labs and evaluation today.  Anemia is stable.  Creatinine mildly elevated.  Will be given fluconazole, discussed this with him, also discussed that he is not to drink alcohol while using that medication or combine with Tylenol.  He endorses that he used to drink quite heavily but has actually cut down his alcohol consumption, and would be comfortable with discontinuing alcohol use.  He reports that he does not experience withdrawals when he stops drinking  Return precautions and treatment recommendations and follow-up discussed with the patient who is agreeable with the plan.       ____________________________________________   FINAL CLINICAL IMPRESSION(S) / ED DIAGNOSES  Final diagnoses:  Thrush, oral  SOB (shortness of breath)  Nodule on x-ray (patient reporting he will follow up with PCP on this)      Note:  This document was prepared using Dragon voice recognition software and may include unintentional dictation errors       Sharyn CreamerQuale, Brynnlee Cumpian, MD 06/02/20  1649

## 2020-06-02 NOTE — ED Notes (Signed)
Pt sister called to check on pt. Pt gave verbal OK to give sister information. Pt sister, Marcelino Duster, informed pt is stable and is waiting on doctor to go over results.

## 2020-06-02 NOTE — ED Notes (Signed)
Pt ambulated around nurses station. SpO2 maintained between 97-98%, with HR maintaining around 95. Pt states that he does get more short of breath with ambulation. Pt reports that he normally walks about 30 minutes a day.

## 2020-06-21 DIAGNOSIS — K219 Gastro-esophageal reflux disease without esophagitis: Secondary | ICD-10-CM | POA: Diagnosis not present

## 2020-06-21 DIAGNOSIS — I480 Paroxysmal atrial fibrillation: Secondary | ICD-10-CM | POA: Diagnosis not present

## 2020-06-21 DIAGNOSIS — I509 Heart failure, unspecified: Secondary | ICD-10-CM | POA: Diagnosis not present

## 2020-06-21 DIAGNOSIS — Z7982 Long term (current) use of aspirin: Secondary | ICD-10-CM | POA: Diagnosis not present

## 2020-06-21 DIAGNOSIS — I110 Hypertensive heart disease with heart failure: Secondary | ICD-10-CM | POA: Diagnosis not present

## 2020-06-21 DIAGNOSIS — J449 Chronic obstructive pulmonary disease, unspecified: Secondary | ICD-10-CM | POA: Diagnosis not present

## 2020-06-21 DIAGNOSIS — D6869 Other thrombophilia: Secondary | ICD-10-CM | POA: Diagnosis not present

## 2020-06-21 DIAGNOSIS — F1021 Alcohol dependence, in remission: Secondary | ICD-10-CM | POA: Diagnosis not present

## 2020-06-28 DIAGNOSIS — I480 Paroxysmal atrial fibrillation: Secondary | ICD-10-CM | POA: Diagnosis not present

## 2020-06-28 DIAGNOSIS — J449 Chronic obstructive pulmonary disease, unspecified: Secondary | ICD-10-CM | POA: Diagnosis not present

## 2020-06-28 DIAGNOSIS — Z79899 Other long term (current) drug therapy: Secondary | ICD-10-CM | POA: Diagnosis not present

## 2020-06-28 DIAGNOSIS — I10 Essential (primary) hypertension: Secondary | ICD-10-CM | POA: Diagnosis not present

## 2020-07-09 ENCOUNTER — Other Ambulatory Visit: Payer: Self-pay | Admitting: Family Medicine

## 2020-07-09 DIAGNOSIS — R911 Solitary pulmonary nodule: Secondary | ICD-10-CM

## 2020-07-13 DIAGNOSIS — R490 Dysphonia: Secondary | ICD-10-CM | POA: Diagnosis not present

## 2020-07-13 DIAGNOSIS — J441 Chronic obstructive pulmonary disease with (acute) exacerbation: Secondary | ICD-10-CM | POA: Diagnosis not present

## 2020-07-19 ENCOUNTER — Ambulatory Visit
Admission: RE | Admit: 2020-07-19 | Discharge: 2020-07-19 | Disposition: A | Discharge status: Home / Self Care | Payer: Medicare HMO | Source: Ambulatory Visit | Attending: Family Medicine | Admitting: Family Medicine

## 2020-07-19 ENCOUNTER — Other Ambulatory Visit: Payer: Self-pay

## 2020-07-19 DIAGNOSIS — R911 Solitary pulmonary nodule: Secondary | ICD-10-CM | POA: Insufficient documentation

## 2020-07-19 DIAGNOSIS — J432 Centrilobular emphysema: Secondary | ICD-10-CM | POA: Diagnosis not present

## 2020-07-19 DIAGNOSIS — L989 Disorder of the skin and subcutaneous tissue, unspecified: Secondary | ICD-10-CM | POA: Diagnosis not present

## 2020-07-19 DIAGNOSIS — I2510 Atherosclerotic heart disease of native coronary artery without angina pectoris: Secondary | ICD-10-CM | POA: Diagnosis not present

## 2020-07-19 DIAGNOSIS — K449 Diaphragmatic hernia without obstruction or gangrene: Secondary | ICD-10-CM | POA: Diagnosis not present

## 2020-08-02 DIAGNOSIS — K7460 Unspecified cirrhosis of liver: Secondary | ICD-10-CM | POA: Diagnosis not present

## 2020-08-02 DIAGNOSIS — J432 Centrilobular emphysema: Secondary | ICD-10-CM | POA: Diagnosis not present

## 2020-08-02 DIAGNOSIS — I700 Atherosclerosis of aorta: Secondary | ICD-10-CM | POA: Diagnosis not present

## 2020-08-02 IMAGING — CT CT RENAL STONE PROTOCOL
2 of 4 series · 15 of 46 positions shown, 17 images · non-contrast
Comparison: Renal ultrasound 03/28/2019 and CT scan from 10/07/2018

CLINICAL DATA: Right flank pain. Hematuria.

EXAM:
CT ABDOMEN AND PELVIS WITHOUT CONTRAST
TECHNIQUE: Multidetector CT imaging of the abdomen and pelvis was performed
following the standard protocol without IV contrast.

[Series 2: stone full standard · axial · 0.70mm/px · z∈[-394,-30]mm · 12 of 84 slices shown, 14 images]
[im 7/84  soft-tissue]
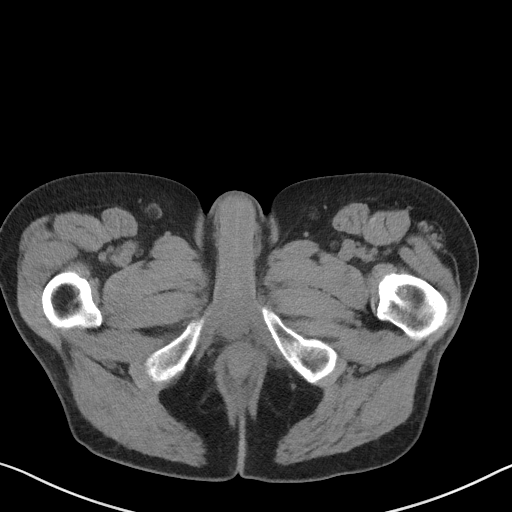
[im 7/84  bone]
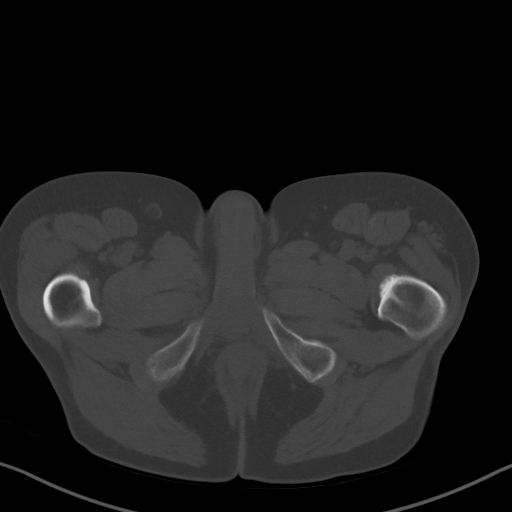
[im 14/84  soft-tissue]
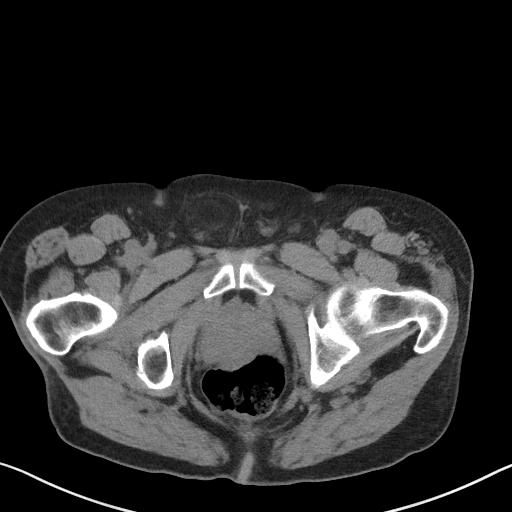
[im 20/84  soft-tissue]
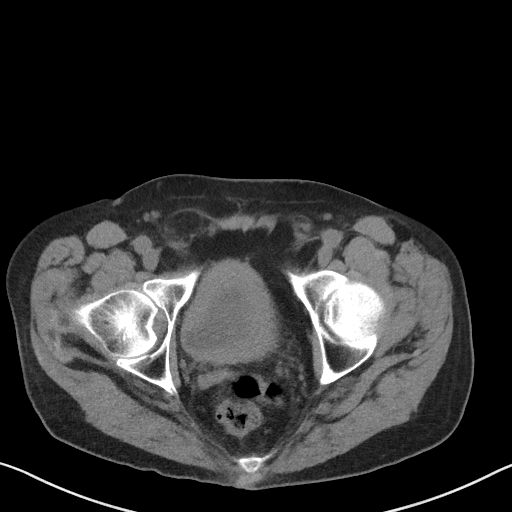
[im 27/84  soft-tissue]
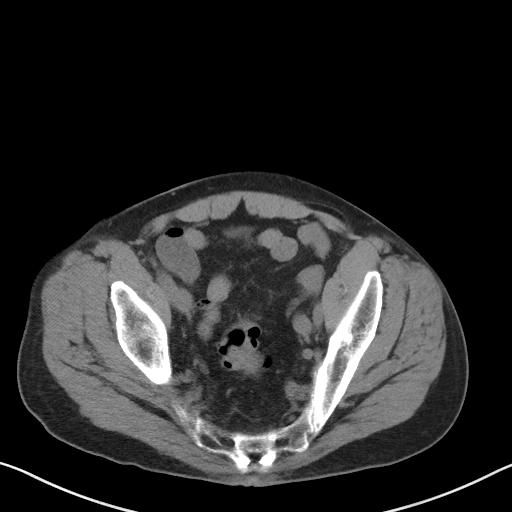
[im 34/84  soft-tissue]
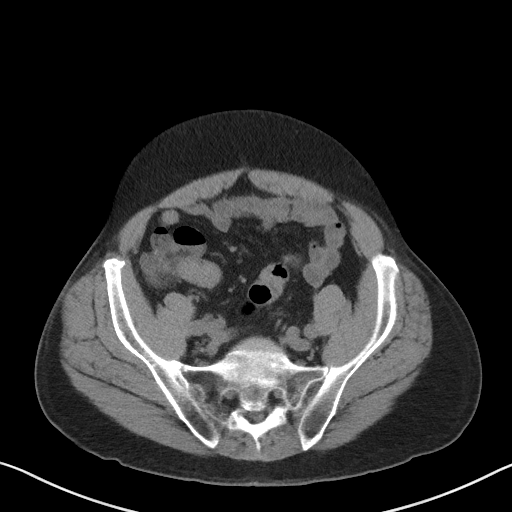
[im 40/84  soft-tissue]
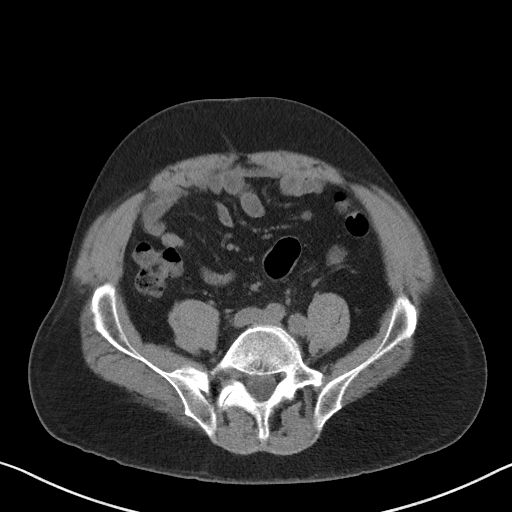
[im 47/84  soft-tissue]
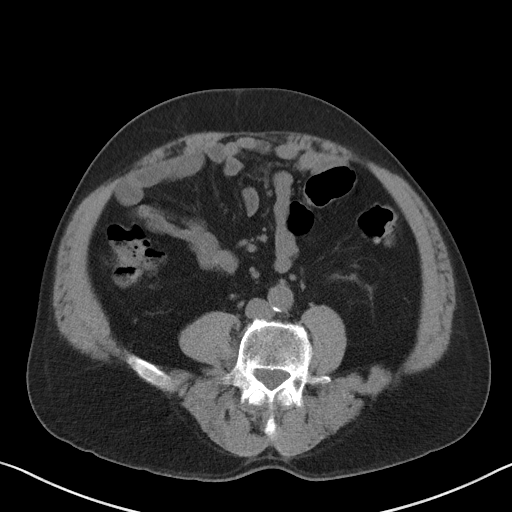
[im 54/84  soft-tissue]
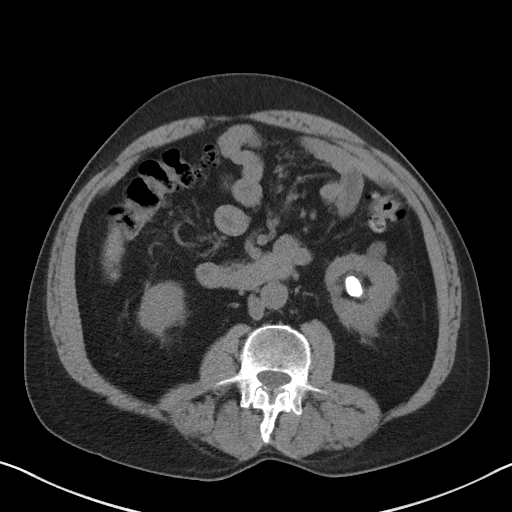
[im 60/84  soft-tissue]
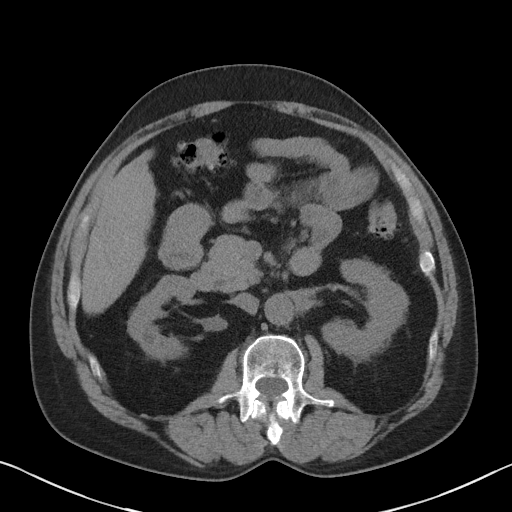
[im 60/84  bone]
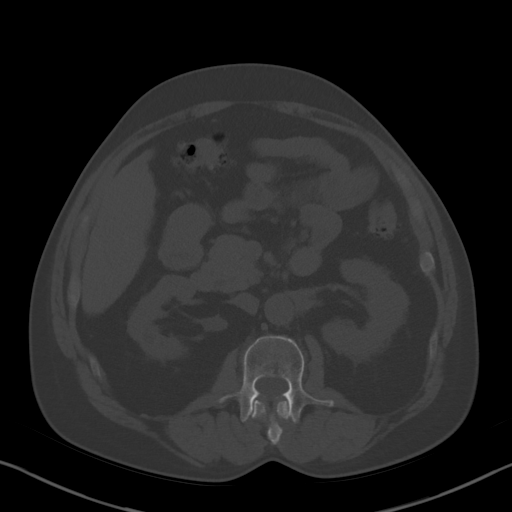
[im 67/84  soft-tissue]
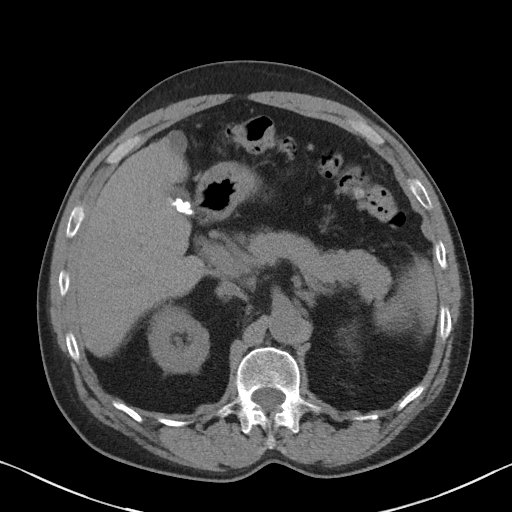
[im 74/84  soft-tissue]
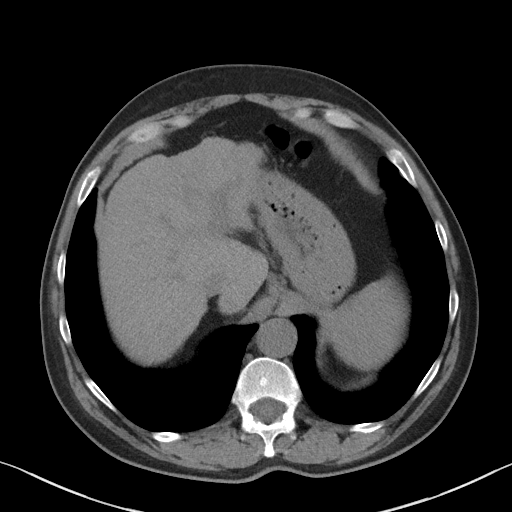
[im 80/84  soft-tissue]
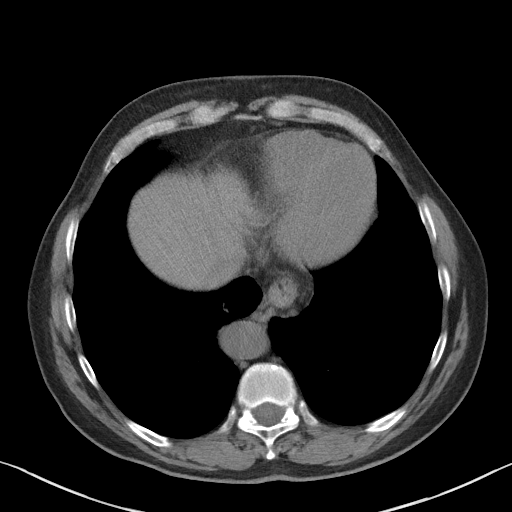

[Series 5: coronal · coronal · 0.65mm/px · 3 of 153 slices shown]
[im 51/153  soft-tissue]
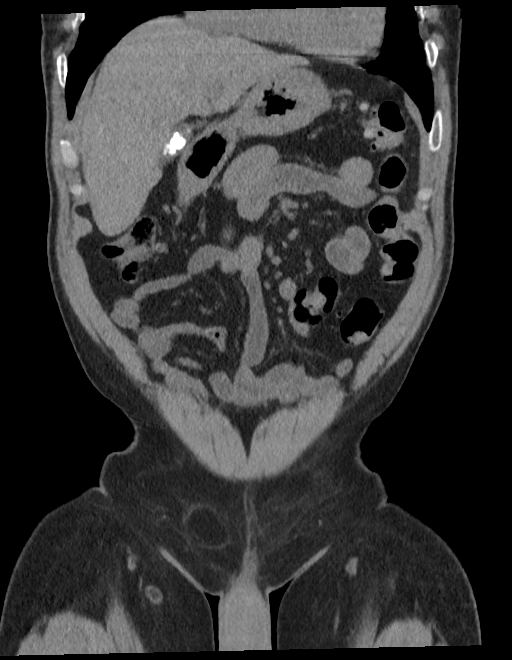
[im 68/153  soft-tissue]
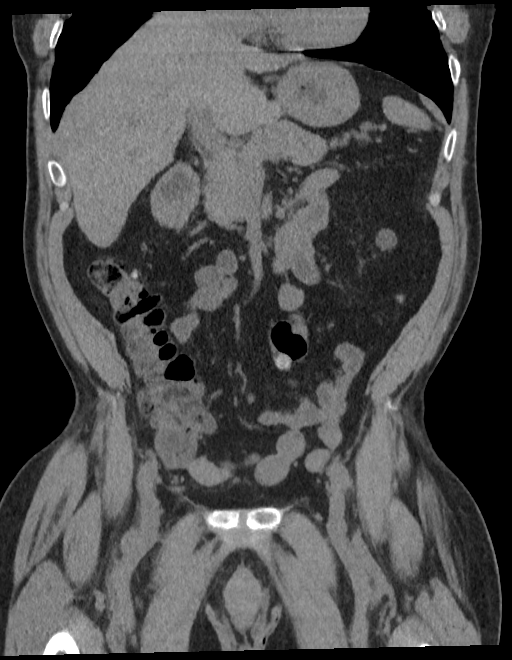
[im 85/153  soft-tissue]
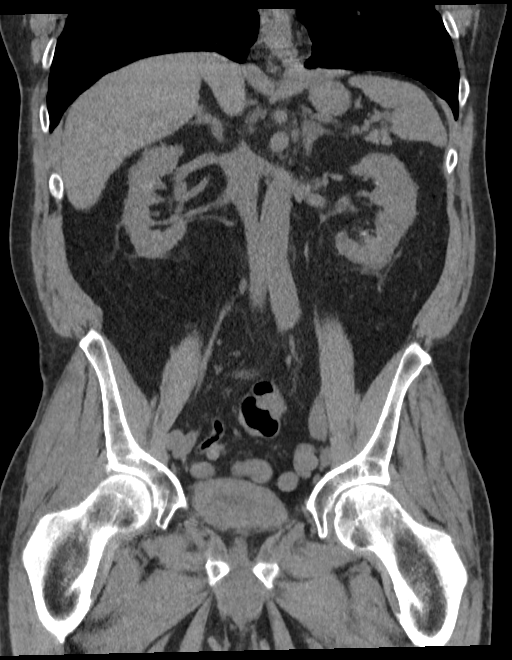

[15 of 46 positions shown; findings below may reference images not displayed]

FINDINGS: Lower chest: Paraesophageal varices. Small type 1 hiatal hernia.
Low-density blood pool suggests anemia.

Hepatobiliary: Hepatic cirrhosis. Multiple gallstones in the
gallbladder.

Pancreas: Unremarkable

Spleen: Unremarkable

Adrenals/Urinary Tract: Left kidney lower pole 2.2 cm staghorn
calculus. Separate 3 mm left kidney upper pole and 1-2 mm left mid
kidney nonobstructive renal calculi. Exophytic 1.4 by 1.3 cm fluid
density lesion from the left kidney lower pole is likely a cyst and
similar to prior. Right mid kidney 0.9 cm hypodense lesion is
similar to prior and likewise most likely to be a cyst.

No ureteral calculus, hydronephrosis, or hydroureter. Nondistended
thick-walled urinary bladder. Low-density transition to the prostate
gland, compatible with transurethral resection of the prostate. The
previous 4.8 cm bladder stone shown on 10/07/2018 is no longer
present.

Stomach/Bowel: Scattered colonic diverticulosis. Normal appendix.

Vascular/Lymphatic: Aortoiliac atherosclerotic vascular disease.

Reproductive: Soft tissue lobularity between the posterior prostate
gland and the anterior rectum, presumably due to prostate nodularity
for example on image 73/2.

Other: No supplemental non-categorized findings.

Musculoskeletal: Indirect right inguinal hernia contains adipose
tissue. Old healed right lower rib fractures. Bridging spurring of
the right sacroiliac joint.

Right foraminal impingement at L3-4 due to degenerative disc
disease.
IMPRESSION: 1. Left kidney lower pole 2.2 cm staghorn calculus. Separate 3 mm
left kidney upper pole and 1-2 mm left mid kidney nonobstructive
renal calculi.
2. Soft tissue lobularity between the posterior prostate gland and
the anterior rectum, presumably due to prostate nodularity.
3. Other imaging findings of potential clinical significance:
Hepatic cirrhosis. Paraesophageal varices. Small type 1 hiatal
hernia. Cholelithiasis. Scattered colonic diverticulosis. Indirect
right inguinal hernia contains adipose tissue. Right foraminal
impingement at L3-4 due to degenerative disc disease. Low-density
blood pool suggests anemia.
4. Aortic atherosclerosis.

Aortic Atherosclerosis ([id number]).

## 2020-08-23 DIAGNOSIS — I110 Hypertensive heart disease with heart failure: Secondary | ICD-10-CM | POA: Diagnosis not present

## 2020-08-23 DIAGNOSIS — K219 Gastro-esophageal reflux disease without esophagitis: Secondary | ICD-10-CM | POA: Diagnosis not present

## 2020-08-23 DIAGNOSIS — I509 Heart failure, unspecified: Secondary | ICD-10-CM | POA: Diagnosis not present

## 2020-08-23 DIAGNOSIS — J449 Chronic obstructive pulmonary disease, unspecified: Secondary | ICD-10-CM | POA: Diagnosis not present

## 2020-08-23 DIAGNOSIS — Z7982 Long term (current) use of aspirin: Secondary | ICD-10-CM | POA: Diagnosis not present

## 2020-08-23 DIAGNOSIS — D6869 Other thrombophilia: Secondary | ICD-10-CM | POA: Diagnosis not present

## 2020-08-23 DIAGNOSIS — Z7951 Long term (current) use of inhaled steroids: Secondary | ICD-10-CM | POA: Diagnosis not present

## 2020-08-23 DIAGNOSIS — Z6822 Body mass index (BMI) 22.0-22.9, adult: Secondary | ICD-10-CM | POA: Diagnosis not present

## 2020-08-23 DIAGNOSIS — I480 Paroxysmal atrial fibrillation: Secondary | ICD-10-CM | POA: Diagnosis not present

## 2020-09-14 ENCOUNTER — Telehealth: Payer: Self-pay

## 2020-09-14 ENCOUNTER — Other Ambulatory Visit
Admission: RE | Admit: 2020-09-14 | Discharge: 2020-09-14 | Disposition: A | Discharge status: Home / Self Care | Payer: Medicare HMO | Source: Ambulatory Visit | Attending: Pulmonary Disease | Admitting: Pulmonary Disease

## 2020-09-14 ENCOUNTER — Other Ambulatory Visit: Payer: Self-pay

## 2020-09-14 ENCOUNTER — Ambulatory Visit
Admission: RE | Admit: 2020-09-14 | Discharge: 2020-09-14 | Disposition: A | Discharge status: Home / Self Care | Payer: Medicare HMO | Source: Ambulatory Visit | Attending: Pulmonary Disease | Admitting: Pulmonary Disease

## 2020-09-14 ENCOUNTER — Encounter: Payer: Self-pay | Admitting: Pulmonary Disease

## 2020-09-14 ENCOUNTER — Ambulatory Visit (INDEPENDENT_AMBULATORY_CARE_PROVIDER_SITE_OTHER): Payer: Medicare HMO | Admitting: Pulmonary Disease

## 2020-09-14 VITALS — BP 110/62 | HR 94 | Temp 97.7°F | Ht 66.0 in | Wt 156.6 lb

## 2020-09-14 DIAGNOSIS — J432 Centrilobular emphysema: Secondary | ICD-10-CM | POA: Diagnosis not present

## 2020-09-14 DIAGNOSIS — D649 Anemia, unspecified: Secondary | ICD-10-CM | POA: Diagnosis not present

## 2020-09-14 DIAGNOSIS — R0602 Shortness of breath: Secondary | ICD-10-CM

## 2020-09-14 DIAGNOSIS — K7030 Alcoholic cirrhosis of liver without ascites: Secondary | ICD-10-CM

## 2020-09-14 LAB — CBC WITH DIFFERENTIAL/PLATELET
Abs Immature Granulocytes: 0.03 10*3/uL (ref 0.00–0.07)
Basophils Absolute: 0.1 10*3/uL (ref 0.0–0.1)
Basophils Relative: 1 %
Eosinophils Absolute: 0.3 10*3/uL (ref 0.0–0.5)
Eosinophils Relative: 4 %
HCT: 31.2 % — ABNORMAL LOW (ref 39.0–52.0)
Hemoglobin: 9.6 g/dL — ABNORMAL LOW (ref 13.0–17.0)
Immature Granulocytes: 0 %
Lymphocytes Relative: 22 %
Lymphs Abs: 1.5 10*3/uL (ref 0.7–4.0)
MCH: 27.4 pg (ref 26.0–34.0)
MCHC: 30.8 g/dL (ref 30.0–36.0)
MCV: 89.1 fL (ref 80.0–100.0)
Monocytes Absolute: 0.7 10*3/uL (ref 0.1–1.0)
Monocytes Relative: 10 %
Neutro Abs: 4.2 10*3/uL (ref 1.7–7.7)
Neutrophils Relative %: 63 %
Platelets: 282 10*3/uL (ref 150–400)
RBC: 3.5 MIL/uL — ABNORMAL LOW (ref 4.22–5.81)
RDW: 18.6 % — ABNORMAL HIGH (ref 11.5–15.5)
WBC: 6.8 10*3/uL (ref 4.0–10.5)
nRBC: 0 % (ref 0.0–0.2)

## 2020-09-14 LAB — COMPREHENSIVE METABOLIC PANEL
ALT: 19 U/L (ref 0–44)
AST: 34 U/L (ref 15–41)
Albumin: 3.4 g/dL — ABNORMAL LOW (ref 3.5–5.0)
Alkaline Phosphatase: 108 U/L (ref 38–126)
Anion gap: 6 (ref 5–15)
BUN: 13 mg/dL (ref 8–23)
CO2: 23 mmol/L (ref 22–32)
Calcium: 8.3 mg/dL — ABNORMAL LOW (ref 8.9–10.3)
Chloride: 106 mmol/L (ref 98–111)
Creatinine, Ser: 1.16 mg/dL (ref 0.61–1.24)
GFR, Estimated: >60 mL/min (ref >60)
Glucose, Bld: 99 mg/dL (ref 70–99)
Potassium: 4.1 mmol/L (ref 3.5–5.1)
Sodium: 135 mmol/L (ref 135–145)
Total Bilirubin: 0.7 mg/dL (ref 0.3–1.2)
Total Protein: 7.5 g/dL (ref 6.5–8.1)

## 2020-09-14 NOTE — Progress Notes (Signed)
Subjective:    Patient ID: Marvin Ortega, male    DOB: 11-05-1950, 70 y.o.   MRN: [id number]  Chief Complaint  Patient presents with   pulmonary consult    Eval for COPD--c/o prod cough with white mucus and sob with exertion.     HPI The patient is a 70 year old lifelong never smoker who presents for evaluation of shortness of breath and cough productive of white mucus.  He is kindly referred by Dr. Derinda Late.  The patient is a very difficult historian.  Not forthcoming with symptoms and has to be prompted for every single response.  He tells me that his dyspnea started after he had procedures for illogic issues.  He states that after being subjected to general anesthesia and placement of the ET tube he noticed hoarseness and dyspnea became more marked.  He does admit that he has had some dyspnea prior.  He states that his dyspnea is worse when he bends over.  He has been using Symbicort but does not note that this really helps him.  He has been recently evaluated by ENT, Dr. Richardson Landry for hoarseness.  Only abnormality noted was some mild edema of the vocal cords but nothing that would explain his symptoms.  The patient does endorse postnasal drip.  He does not endorse any chest pain, he notices occasional "rattling" at the level of his throat but does not endorse wheezing per se.  He does not note lower extremity edema.  He does not endorse gastroesophageal reflux symptoms and is on Protonix for the same.  Has not had any fevers, chills or sweats.  No orthopnea or paroxysmal nocturnal dyspnea.    As noted the patient is a lifelong never smoker.  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  He has a history of anemia and appears to run chronically with a hemoglobin of between 8-9.  Recent neck and chest CT failed to show any tracheal stenosis.  He does have a heterogeneous pattern of centrilobular emphysema on chest  CT.   Review of Systems A 10 point review of systems was performed and it is as noted above otherwise negative.  Past Medical History:  Diagnosis Date   Alcoholic cirrhosis of liver (HCC)    Anemia    Atrial fibrillation (HCC)    Atrial fibrillation (HCC)    BPH (benign prostatic hyperplasia)    COPD (chronic obstructive pulmonary disease) (HCC)    Dieulafoy lesion of stomach    GERD (gastroesophageal reflux disease)    h/o   GIB (gastrointestinal bleeding) 02/15/2018   Hemorrhagic shock (HCC)    History of kidney stones    Hypertension    Pre-diabetes    Sepsis ([place])    Upper GI bleed    Past Surgical History:  Procedure Laterality Date   CYSTOSCOPY W/ RETROGRADES Right 11/23/2018   Procedure: CYSTOSCOPY WITH RETROGRADE PYELOGRAM;  Surgeon: Abbie Sons, MD;  Location: [facility] ORS;  Service: Urology;  Laterality: Right;   CYSTOSCOPY WITH HOLMIUM LASER LITHOTRIPSY  11/30/2018   Procedure: CYSTOSCOPY WITH HOLMIUM LASER LITHOTRIPSY;  Surgeon: Abbie Sons, MD;  Location: [facility];  Service: Urology;;   CYSTOSCOPY WITH LITHOLAPAXY N/A 11/23/2018   Procedure: CYSTOSCOPY WITH LITHOLAPAXY;  Surgeon: Abbie Sons, MD;  Location: [facility];  Service: Urology;  Laterality: N/A;   CYSTOSCOPY WITH LITHOLAPAXY N/A 11/30/2018   Procedure: CYSTOSCOPY WITH LITHOLAPAXY;  Surgeon: Bernardo Heater,  Ronda Fairly, MD;  Location: [facility];  Service: Urology;  Laterality: N/A;   CYSTOSCOPY/URETEROSCOPY/HOLMIUM LASER/STENT PLACEMENT Right 11/23/2018   Procedure: CYSTOSCOPY/URETEROSCOPY/HOLMIUM LASER/STENT PLACEMENT;  Surgeon: Abbie Sons, MD;  Location: [facility];  Service: Urology;  Laterality: Right;   ESOPHAGOGASTRODUODENOSCOPY N/A 02/15/2018   Procedure: ESOPHAGOGASTRODUODENOSCOPY (EGD);  Surgeon: Lin Landsman, MD;  Location: [facility] ENDOSCOPY;  Service: Gastroenterology;  Laterality: N/A;   TRANSURETHRAL RESECTION OF PROSTATE N/A 05/24/2019   Procedure: TRANSURETHRAL RESECTION OF THE PROSTATE  (TURP);  Surgeon: Abbie Sons, MD;  Location: [facility];  Service: Urology;  Laterality: N/A;   Family History  Problem Relation Age of Onset   Heart disease Father    Aneurysm Sister    Social History   Tobacco Use   Smoking status: Never   Smokeless tobacco: Never  Substance Use Topics   Alcohol use: Not Currently  Past heavy alcohol use, none recently.  No Known Allergies  Current Meds  Medication Sig   aspirin EC 81 MG tablet Take 81 mg by mouth daily.   atorvastatin (LIPITOR) 20 MG tablet Take 1 tablet by mouth daily.   budesonide-formoterol (SYMBICORT) 160-4.5 MCG/ACT inhaler Inhale into the lungs.   finasteride (PROSCAR) 5 MG tablet Take 5 mg by mouth every morning.    Ipratropium-Albuterol (COMBIVENT) 20-100 MCG/ACT AERS respimat Inhale into the lungs.   loratadine (CLARITIN) 10 MG tablet Take 10 mg by mouth daily.   metoprolol succinate (TOPROL-XL) 50 MG 24 hr tablet Take 1 tablet by mouth daily.   pantoprazole (PROTONIX) 40 MG tablet Take 1 tablet (40 mg total) by mouth 2 (two) times daily.   spironolactone (ALDACTONE) 25 MG tablet 1 tablet daily.   Thiamine HCl (VITAMIN B-1) 250 MG tablet Take 250 mg by mouth daily.   [DISCONTINUED] finasteride (PROSCAR) 5 MG tablet Take 1 tablet by mouth daily.   [DISCONTINUED] metoprolol tartrate (LOPRESSOR) 50 MG tablet Take one tablet every morning   [DISCONTINUED] pantoprazole (PROTONIX) 40 MG tablet Take 1 tablet by mouth 2 (two) times daily.   [DISCONTINUED] predniSONE (DELTASONE) 50 MG tablet 1 tab by mouth daily   Immunization History  Administered Date(s) Administered   Influenza Inj Mdck Quad Pf 12/31/2018   Influenza Split 12/14/2014   Influenza, High Dose Seasonal PF 12/15/2017   Influenza,inj,Quad PF,6+ Mos 01/09/2020   Influenza-Unspecified 01/25/2009, 01/29/2012, 12/21/2015, 12/15/2017   Pneumococcal Polysaccharide-23 12/08/2013, 02/18/2018      Objective:   Physical Exam BP 110/62 (BP Location: Left Arm,  Cuff Size: Normal)   Pulse 94   Temp 97.7 F (36.5 C) (Temporal)   Ht _0  (1.676 m)   Wt 156 lb 9.6 oz (71 kg)   SpO2 97%   BMI 25.28 kg/m   GENERAL: Well-developed, well-nourished gentleman, fully ambulatory, no acute distress.  No conversational dyspnea.  Hoarse voice. HEAD: Normocephalic, atraumatic.  EYES: Pupils equal, round, reactive to light.  No scleral icterus.  MOUTH: Poor dentition, Mallampati 2, no thrush. NECK: Supple. No thyromegaly. Trachea midline. No JVD.  No adenopathy. PULMONARY: Good air entry bilaterally.  No adventitious sounds. CARDIOVASCULAR: S1 and S2. Regular rate and rhythm.  Grade 1/6 to 2/6 systolic ejection murmur left sternal border.  No gallop. ABDOMEN: Protuberant abdomen, query ascites, no tenderness. MUSCULOSKELETAL: No joint deformity, no clubbing, no edema.  NEUROLOGIC: No focal deficit, no gait disturbance, speech is fluent. SKIN: Intact,warm,dry.  No lower extremity edema. PSYCH: Flat affect.  Ambulatory oximetry was performed today: Baseline oxygen saturation 100%, nadir 93%, patient  able to ambulate 750 feet without difficulty.  Mild shortness of breath reported.  Little to no variability on heart rate during exercise.  Representative image from 19 Jul 2020 CT chest.   Recent Results (from the past 2160 hour(s))  CBC w/Diff     Status: Abnormal   Collection Time: 09/14/20 11:50 AM  Result Value Ref Range   WBC 6.8 4.0 - 10.5 K/uL   RBC 3.50 (L) 4.22 - 5.81 MIL/uL   Hemoglobin 9.6 (L) 13.0 - 17.0 g/dL   HCT 31.2 (L) 39.0 - 52.0 %   MCV 89.1 80.0 - 100.0 fL   MCH 27.4 26.0 - 34.0 pg   MCHC 30.8 30.0 - 36.0 g/dL   RDW 18.6 (H) 11.5 - 15.5 %   Platelets 282 150 - 400 K/uL   nRBC 0.0 0.0 - 0.2 %   Neutrophils Relative % 63 %   Neutro Abs 4.2 1.7 - 7.7 K/uL   Lymphocytes Relative 22 %   Lymphs Abs 1.5 0.7 - 4.0 K/uL   Monocytes Relative 10 %   Monocytes Absolute 0.7 0.1 - 1.0 K/uL   Eosinophils Relative 4 %   Eosinophils Absolute  0.3 0.0 - 0.5 K/uL   Basophils Relative 1 %   Basophils Absolute 0.1 0.0 - 0.1 K/uL   Immature Granulocytes 0 %   Abs Immature Granulocytes 0.03 0.00 - 0.07 K/uL    Comment: Performed at [facility], [address] [place], [place] [address]  Comprehensive metabolic panel     Status: Abnormal   Collection Time: 09/14/20 11:50 AM  Result Value Ref Range   Sodium 135 135 - 145 mmol/L   Potassium 4.1 3.5 - 5.1 mmol/L   Chloride 106 98 - 111 mmol/L   CO2 23 22 - 32 mmol/L   Glucose, Bld 99 70 - 99 mg/dL    Comment: Glucose reference range applies only to samples taken after fasting for at least 8 hours.   BUN 13 8 - 23 mg/dL   Creatinine, Ser 1.16 0.61 - 1.24 mg/dL   Calcium 8.3 (L) 8.9 - 10.3 mg/dL   Total Protein 7.5 6.5 - 8.1 g/dL   Albumin 3.4 (L) 3.5 - 5.0 g/dL   AST 34 15 - 41 U/L   ALT 19 0 - 44 U/L   Alkaline Phosphatase 108 38 - 126 U/L   Total Bilirubin 0.7 0.3 - 1.2 mg/dL   GFR, Estimated >60 >60 mL/min    Comment: (NOTE) Calculated using the CKD-EPI Creatinine Equation (2021)    Anion gap 6 5 - 15    Comment: Performed at [facility], [address] [place], [place] [address]      Assessment & Plan:     ICD-10-CM   1. Shortness of breath  R06.02 Pulmonary Function Test [facility] Only    CBC w/Diff    Comp Met (CMET)   He describes mostly with bending over Does have protuberant abdomen query ascites Will obtain PFTs    2. Centrilobular emphysema ([place])  J43.2    Heterogeneous pattern Lifelong never smoker Query occupational Query alpha-1 (vis a vis cirrhosis)    3. Normocytic anemia  D64.9    This issue adds complexity to his management Anemia will aggravate dyspnea    4. Alcoholic cirrhosis, unspecified whether ascites present [facility])  K70.30    This issue adds complexity to his management Check alpha-1 May need to assess further Query element of hepatopulmonary syndrome     Orders  Placed This Encounter  Procedures   CBC w/Diff     Standing Status:   Future    Number of Occurrences:   1    Standing Expiration Date:   09/14/2021   Comp Met (CMET)   Pulmonary Function Test [facility] Only    Standing Status:   Future    Standing Expiration Date:   09/14/2021    Scheduling Instructions:     4 weeks    Order Specific Question:   Full PFT: includes the following: basic spirometry, spirometry pre & post bronchodilator, diffusion capacity (DLCO), lung volumes    Answer:   Full PFT  Alpha-1 antitrypsin kit sent   Discussion:  The patient has evidence of centrilobular emphysema and heterogeneous pattern.  He is a lifelong never smoker.  This coupled with cirrhosis begs for evaluation of potential alpha-1 antitrypsin deficiency.  Other possibilities for centrilobular emphysema and a non-smoker include occupational exposure.  PFTs should help clarify lung function.  He may need further evaluation with regards to his cirrhosis.  He appears to have significant abdominal girth and most of his dyspnea issues occur when he bends over.  Query if he is developing more issues with ascites.  Patients with cirrhosis are also at higher risk for potential hepatopulmonary syndrome however he does not appear to have florid symptoms of this at present.  I will defer evaluation of this to his primary physician.  The patient also has anemia which can aggravate sensation of dyspnea due to decreased oxygen carrying capacity.  We will follow-up on the studies as above.  We will see him in follow-up in 6 to 8 weeks time he is to contact us prior to that time should any new difficulties arise.   Renold Don, MD  PCCM   *This note was dictated using voice recognition software/Dragon.  Despite best efforts to proofread, errors can occur which can change the meaning.  Any change was purely unintentional.

## 2020-09-14 NOTE — Patient Instructions (Addendum)
We are going to get breathing tests  We are doing a test to check for some forms of emphysema  Continue medications as you are now doing  Make sure you rinse your mouth well after you use your inhaler  We are going to get some blood work to check for your low blood as this can cause shortness of breath and we will also check on your liver and kidney function  We will see you in follow-up in 6 to 8 weeks time call sooner should any new problems arise.  You may see me or the nurse practitioner.

## 2020-09-14 NOTE — Telephone Encounter (Signed)
Records have been received and placed in Dr. Georgann Housekeeper folder for signature.

## 2020-09-14 NOTE — Telephone Encounter (Signed)
Medical record request has been faxed to [facility] at [phone].

## 2020-09-24 ENCOUNTER — Telehealth: Payer: Self-pay | Admitting: Pulmonary Disease

## 2020-09-24 NOTE — Telephone Encounter (Signed)
ATC patient to relay date/time of covid test prior to PFT. No answer with no option to leave vm. Ling rang for >59min.   09/26/2020 between 8:00-12:[facility]

## 2020-09-25 NOTE — Telephone Encounter (Signed)
Patient is aware of date/time of covid prior to PFT.   

## 2020-09-26 ENCOUNTER — Other Ambulatory Visit: Payer: Self-pay

## 2020-09-26 ENCOUNTER — Other Ambulatory Visit
Admission: RE | Admit: 2020-09-26 | Discharge: 2020-09-26 | Disposition: A | Discharge status: Home / Self Care | Payer: Medicare HMO | Source: Ambulatory Visit | Attending: Pulmonary Disease | Admitting: Pulmonary Disease

## 2020-09-26 DIAGNOSIS — Z20822 Contact with and (suspected) exposure to covid-19: Secondary | ICD-10-CM | POA: Insufficient documentation

## 2020-09-26 DIAGNOSIS — Z01812 Encounter for preprocedural laboratory examination: Secondary | ICD-10-CM | POA: Insufficient documentation

## 2020-09-26 LAB — SARS CORONAVIRUS 2 (TAT 6-24 HRS): SARS Coronavirus 2: NEGATIVE

## 2020-09-27 ENCOUNTER — Ambulatory Visit: Payer: Medicare HMO | Attending: Pulmonary Disease

## 2020-09-27 DIAGNOSIS — R0602 Shortness of breath: Secondary | ICD-10-CM | POA: Diagnosis not present

## 2020-09-27 DIAGNOSIS — R059 Cough, unspecified: Secondary | ICD-10-CM | POA: Insufficient documentation

## 2020-09-27 MED ORDER — ALBUTEROL SULFATE (2.5 MG/3ML) 0.083% IN NEBU
2.5000 mg | INHALATION_SOLUTION | Freq: Once | RESPIRATORY_TRACT | Status: AC
  Administered 2020-09-27: 2.5 mg via RESPIRATORY_TRACT
  Filled 2020-09-27: qty 3

## 2020-09-28 DIAGNOSIS — I110 Hypertensive heart disease with heart failure: Secondary | ICD-10-CM | POA: Diagnosis not present

## 2020-09-28 DIAGNOSIS — J449 Chronic obstructive pulmonary disease, unspecified: Secondary | ICD-10-CM | POA: Diagnosis not present

## 2020-09-28 DIAGNOSIS — D6869 Other thrombophilia: Secondary | ICD-10-CM | POA: Diagnosis not present

## 2020-09-28 DIAGNOSIS — I480 Paroxysmal atrial fibrillation: Secondary | ICD-10-CM | POA: Diagnosis not present

## 2020-09-28 DIAGNOSIS — F1010 Alcohol abuse, uncomplicated: Secondary | ICD-10-CM | POA: Diagnosis not present

## 2020-09-28 DIAGNOSIS — I509 Heart failure, unspecified: Secondary | ICD-10-CM | POA: Diagnosis not present

## 2020-09-28 DIAGNOSIS — Z7982 Long term (current) use of aspirin: Secondary | ICD-10-CM | POA: Diagnosis not present

## 2020-09-28 DIAGNOSIS — Z6824 Body mass index (BMI) 24.0-24.9, adult: Secondary | ICD-10-CM | POA: Diagnosis not present

## 2020-10-18 ENCOUNTER — Telehealth: Payer: Self-pay

## 2020-10-18 NOTE — Telephone Encounter (Signed)
Called and spoke with patient to let him know that his A-1 testing was normal. He expressed understanding. Nothing further needed at this time.

## 2020-11-07 ENCOUNTER — Ambulatory Visit (INDEPENDENT_AMBULATORY_CARE_PROVIDER_SITE_OTHER): Payer: Medicare HMO | Admitting: Pulmonary Disease

## 2020-11-07 ENCOUNTER — Encounter: Payer: Self-pay | Admitting: Pulmonary Disease

## 2020-11-07 ENCOUNTER — Other Ambulatory Visit: Payer: Self-pay

## 2020-11-07 VITALS — BP 130/84 | HR 88 | Temp 98.1°F | Ht 66.0 in | Wt 159.4 lb

## 2020-11-07 DIAGNOSIS — J449 Chronic obstructive pulmonary disease, unspecified: Secondary | ICD-10-CM

## 2020-11-07 DIAGNOSIS — J439 Emphysema, unspecified: Secondary | ICD-10-CM

## 2020-11-07 DIAGNOSIS — R0602 Shortness of breath: Secondary | ICD-10-CM

## 2020-11-07 DIAGNOSIS — K7030 Alcoholic cirrhosis of liver without ascites: Secondary | ICD-10-CM

## 2020-11-07 MED ORDER — BREZTRI AEROSPHERE 160-9-4.8 MCG/ACT IN AERO: 2.0000 | INHALATION_SPRAY | Freq: Two times a day (BID) | RESPIRATORY_TRACT | 6 refills | Status: DC

## 2020-11-07 MED ORDER — ALBUTEROL SULFATE HFA 108 (90 BASE) MCG/ACT IN AERS: 2.0000 | INHALATION_SPRAY | Freq: Four times a day (QID) | RESPIRATORY_TRACT | 2 refills | Status: AC | PRN

## 2020-11-07 MED ORDER — BREZTRI AEROSPHERE 160-9-4.8 MCG/ACT IN AERO: 160.0000 ug | INHALATION_SPRAY | Freq: Two times a day (BID) | RESPIRATORY_TRACT | 0 refills | Status: DC

## 2020-11-07 NOTE — Patient Instructions (Signed)
Your inhalers have been changed as follows:  1) your new inhaler will be Breztri 2 puffs twice a day, make sure you rinse your mouth well after you use it.  2) do not use Symbicort (red inhaler) anymore  3) your new rescue inhaler will be albuterol this will 4 times a day  as needed.  This will replace Combivent (orange Inhaler)  4) we will see you in follow-up in 3 months.

## 2020-11-07 NOTE — Progress Notes (Signed)
Subjective:    Patient ID: Marvin Ortega, male    DOB: 01-Apr-1950, 70 y.o.   MRN: [id number] Chief Complaint  Patient presents with   Follow-up    SOB,wheezing, cough w white phlegm    HPI The patient is a 70 year old lifelong never smoker who presents for follow-up of shortness of breath and cough productive of white mucus.  He was initially seen on 14 September 2020.  This is a follow-up visit. The patient is a very difficult historian.  Not forthcoming with symptoms and has to be prompted for every single response.  He tells me that his dyspnea started after he had procedures for urologic issues.  He states that after being subjected to general anesthesia and placement of the ET tube he noticed hoarseness and dyspnea became more marked.  He does admit that he has had some dyspnea prior.  He states that his dyspnea is worse when he bends over.  He has been using Symbicort but does not note that this really helps him.  He has been evaluated by ENT, Dr. Willeen Cass for hoarseness.  Only abnormality noted was some mild edema of the vocal cords but nothing that would explain his symptoms.  The patient does endorse postnasal drip.  He does not endorse any chest pain, he notices occasional "rattling" at the level of his throat but does not endorse wheezing per se.  He does not note lower extremity edema.  He does not endorse gastroesophageal reflux symptoms and is on Protonix for the same.  Has not had any fevers, chills or sweats.  No orthopnea or paroxysmal nocturnal dyspnea.     As noted the patient is a lifelong never smoker.  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  He has a history of anemia and appears to run chronically with a hemoglobin of between 8-9.  Recent neck and chest CT failed to show any tracheal stenosis.  He does have a heterogeneous pattern of centrilobular emphysema on chest CT.  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  There is a small airways component, there is moderate diffusion capacity impairment.  Alpha 1 antitrypsin still pending.  Review of Systems A 10 point review of systems was performed and it is as noted above otherwise negative.  Patient Active Problem List   Diagnosis Date Noted   S/P TURP 05/24/2019   Acute renal failure (ARF) (HCC) 03/28/2019   COPD (chronic obstructive pulmonary disease) (HCC) 03/28/2019   Acidosis 03/28/2019   Normocytic anemia 03/28/2019   Bladder calculus 11/30/2018   C. difficile colitis 11/25/2018   Renal calculus 11/23/2018   Ureteral calculi 10/29/2018   Calculus of bladder 10/29/2018   Nephrolithiasis 10/29/2018   Gross hematuria 09/28/2018   PAF (paroxysmal atrial fibrillation) (HCC) 02/25/2018   Benign prostatic hyperplasia with lower urinary tract symptoms 12/08/2013   Essential (primary) hypertension 12/08/2013   AA (alcohol abuse) 12/08/2013   GERD (gastroesophageal reflux disease) 02/13/2011   Hyperlipidemia 06/20/2010   Social History   Tobacco Use   Smoking status: Never   Smokeless tobacco: Never  Substance Use Topics   Alcohol use: Not Currently   No Known Allergies Current Meds  Medication Sig   albuterol (VENTOLIN HFA) 108 (90 Base) MCG/ACT inhaler Inhale 2 puffs into the  lungs every 6 (six) hours as needed for wheezing or shortness of breath.   aspirin EC 81 MG tablet Take 81 mg by mouth daily.   atorvastatin (LIPITOR) 20 MG tablet Take 1 tablet by mouth daily.   [START ON 11/28/2020] Budeson-Glycopyrrol-Formoterol (BREZTRI AEROSPHERE) 160-9-4.8 MCG/ACT AERO Inhale 2 puffs into the lungs in the morning and at bedtime.   finasteride (PROSCAR) 5 MG tablet Take 5 mg by mouth every morning.    loratadine (CLARITIN) 10 MG tablet Take 10 mg by mouth daily.   metoprolol succinate  (TOPROL-XL) 50 MG 24 hr tablet Take 1 tablet by mouth daily.   pantoprazole (PROTONIX) 40 MG tablet Take 1 tablet (40 mg total) by mouth 2 (two) times daily.   spironolactone (ALDACTONE) 25 MG tablet 1 tablet daily.   Thiamine HCl (VITAMIN B-1) 250 MG tablet Take 250 mg by mouth daily.   [DISCONTINUED] budesonide-formoterol (SYMBICORT) 160-4.5 MCG/ACT inhaler Inhale into the lungs.   [DISCONTINUED] Ipratropium-Albuterol (COMBIVENT) 20-100 MCG/ACT AERS respimat Inhale into the lungs.   Immunization History  Administered Date(s) Administered   Influenza Inj Mdck Quad Pf 12/31/2018   Influenza Split 12/14/2014   Influenza, High Dose Seasonal PF 12/15/2017   Influenza,inj,Quad PF,6+ Mos 01/09/2020   Influenza-Unspecified 01/25/2009, 01/29/2012, 12/21/2015, 12/15/2017   Pneumococcal Polysaccharide-23 12/08/2013, 02/18/2018       Objective:   Physical Exam BP 130/84 (BP Location: Left Arm, Patient Position: Sitting, Cuff Size: Normal)   Pulse 88   Temp 98.1 F (36.7 C) (Oral)   Ht 5\' 6"  (1.676 m)   Wt 159 lb 6.4 oz (72.3 kg)   SpO2 99%   BMI 25.73 kg/m   GENERAL: Well-developed, well-nourished gentleman, fully ambulatory, no acute distress.  No conversational dyspnea.  Hoarse voice. HEAD: Normocephalic, atraumatic.  EYES: Pupils equal, round, reactive to light.  No scleral icterus.  MOUTH: Poor dentition, Mallampati 2, no thrush. NECK: Supple. No thyromegaly. Trachea midline. No JVD.  No adenopathy. PULMONARY: Good air entry bilaterally.  No adventitious sounds. CARDIOVASCULAR: S1 and S2. Regular rate and rhythm.  Grade 1/6 to 2/6 systolic ejection murmur left sternal border.  No gallop. ABDOMEN: Protuberant abdomen, query ascites, no tenderness. MUSCULOSKELETAL: No joint deformity, no clubbing, no edema.  NEUROLOGIC: No focal deficit, no gait disturbance, speech is fluent. SKIN: Intact,warm,dry.  No lower extremity edema. PSYCH: Flat affect.      Assessment & Plan:      ICD-10-CM   1. COPD with chronic bronchitis and emphysema (HCC)  J44.9    Switch Symbicort to Breztri, 2 puffs twice a day Switch Combivent to albuterol as needed    2. Shortness of breath  R06.02    Multifactorial Emphysema/increased abdominal girth    3. Alcoholic cirrhosis, unspecified whether ascites present (HCC)  K70.30    This issue adds complexity to his management     Meds ordered this encounter  Medications   Budeson-Glycopyrrol-Formoterol (BREZTRI AEROSPHERE) 160-9-4.8 MCG/ACT AERO    Sig: Inhale 2 puffs into the lungs in the morning and at bedtime.    Dispense:  10.7 g    Refill:  6   albuterol (VENTOLIN HFA) 108 (90 Base) MCG/ACT inhaler    Sig: Inhale 2 puffs into the lungs every 6 (six) hours as needed for wheezing or shortness of breath.    Dispense:  8 g    Refill:  2   Budeson-Glycopyrrol-Formoterol (BREZTRI AEROSPHERE) 160-9-4.8 MCG/ACT AERO    Sig: Inhale 160 mcg into the lungs in the morning and  at bedtime.    Dispense:  5.9 g    Refill:  0    Order Specific Question:   Lot Number?    Answer:   [id number]    Order Specific Question:   Expiration Date?    Answer:   06/05/2023    Order Specific Question:   NDC    Answer:   1478-2956-21 [[id number]]    Order Specific Question:   Quantity    Answer:   2   Will see the patient in follow-up in 3 months time he is to contact us prior to that time for any new difficulties arise.

## 2020-12-13 ENCOUNTER — Ambulatory Visit: Payer: Self-pay | Admitting: Urology

## 2021-01-07 ENCOUNTER — Encounter: Payer: Self-pay | Admitting: Urology

## 2021-01-07 ENCOUNTER — Ambulatory Visit (INDEPENDENT_AMBULATORY_CARE_PROVIDER_SITE_OTHER): Payer: Medicare HMO | Admitting: Urology

## 2021-01-07 ENCOUNTER — Ambulatory Visit
Admission: RE | Admit: 2021-01-07 | Discharge: 2021-01-07 | Disposition: A | Discharge status: Home / Self Care | Payer: Medicare HMO | Source: Ambulatory Visit | Attending: Urology | Admitting: Urology

## 2021-01-07 ENCOUNTER — Other Ambulatory Visit: Payer: Self-pay

## 2021-01-07 VITALS — BP 143/93 | HR 91 | Ht 69.0 in | Wt 160.0 lb

## 2021-01-07 DIAGNOSIS — M47816 Spondylosis without myelopathy or radiculopathy, lumbar region: Secondary | ICD-10-CM | POA: Diagnosis not present

## 2021-01-07 DIAGNOSIS — M160 Bilateral primary osteoarthritis of hip: Secondary | ICD-10-CM | POA: Diagnosis not present

## 2021-01-07 DIAGNOSIS — N200 Calculus of kidney: Secondary | ICD-10-CM | POA: Insufficient documentation

## 2021-01-07 DIAGNOSIS — Z87442 Personal history of urinary calculi: Secondary | ICD-10-CM

## 2021-01-07 DIAGNOSIS — N400 Enlarged prostate without lower urinary tract symptoms: Secondary | ICD-10-CM

## 2021-01-07 DIAGNOSIS — K8020 Calculus of gallbladder without cholecystitis without obstruction: Secondary | ICD-10-CM | POA: Diagnosis not present

## 2021-01-07 LAB — BLADDER SCAN AMB NON-IMAGING: Scan Result: 8

## 2021-01-07 NOTE — Progress Notes (Signed)
01/07/2021 3:05 PM   Marvin Ortega May 24, 1950 [id number]  Referring provider: Kandyce Rud, MD [id number] [address] [facility] [place],  [place] [address]  Chief Complaint  Patient presents with   Benign Prostatic Hypertrophy    Urologic history:  1.  Nephrolithiasis Ureteroscopic stone removal 11/2018  2.  History bladder calculus Cystolitholapaxy 11/2018 Declined outlet procedure  3.  BPH with urinary retention TURP 05/2019   HPI: 70 y.o. male presents for annual follow-up.  No bothersome LUTS Denies dysuria, gross hematuria No flank, abdominal or pelvic pain   PMH: Past Medical History:  Diagnosis Date   Alcoholic cirrhosis of liver (HCC)    Anemia    Atrial fibrillation (HCC)    Atrial fibrillation (HCC)    BPH (benign prostatic hyperplasia)    COPD (chronic obstructive pulmonary disease) (HCC)    Dieulafoy lesion of stomach    GERD (gastroesophageal reflux disease)    h/o   GIB (gastrointestinal bleeding) 02/15/2018   Hemorrhagic shock (HCC)    History of kidney stones    Hypertension    Pre-diabetes    Sepsis (HCC)    Upper GI bleed     Surgical History: Past Surgical History:  Procedure Laterality Date   CYSTOSCOPY W/ RETROGRADES Right 11/23/2018   Procedure: CYSTOSCOPY WITH RETROGRADE PYELOGRAM;  Surgeon: Riki Altes, MD;  Location: [facility] ORS;  Service: Urology;  Laterality: Right;   CYSTOSCOPY WITH HOLMIUM LASER LITHOTRIPSY  11/30/2018   Procedure: CYSTOSCOPY WITH HOLMIUM LASER LITHOTRIPSY;  Surgeon: Riki Altes, MD;  Location: [facility];  Service: Urology;;   CYSTOSCOPY WITH LITHOLAPAXY N/A 11/23/2018   Procedure: CYSTOSCOPY WITH LITHOLAPAXY;  Surgeon: Riki Altes, MD;  Location: [facility];  Service: Urology;  Laterality: N/A;   CYSTOSCOPY WITH LITHOLAPAXY N/A 11/30/2018   Procedure: CYSTOSCOPY WITH LITHOLAPAXY;  Surgeon: Riki Altes, MD;  Location: [facility];  Service: Urology;  Laterality:  N/A;   CYSTOSCOPY/URETEROSCOPY/HOLMIUM LASER/STENT PLACEMENT Right 11/23/2018   Procedure: CYSTOSCOPY/URETEROSCOPY/HOLMIUM LASER/STENT PLACEMENT;  Surgeon: Riki Altes, MD;  Location: [facility];  Service: Urology;  Laterality: Right;   ESOPHAGOGASTRODUODENOSCOPY N/A 02/15/2018   Procedure: ESOPHAGOGASTRODUODENOSCOPY (EGD);  Surgeon: Toney Reil, MD;  Location: [facility] ENDOSCOPY;  Service: Gastroenterology;  Laterality: N/A;   TRANSURETHRAL RESECTION OF PROSTATE N/A 05/24/2019   Procedure: TRANSURETHRAL RESECTION OF THE PROSTATE (TURP);  Surgeon: Riki Altes, MD;  Location: [facility];  Service: Urology;  Laterality: N/A;    Home Medications:  Allergies as of 01/07/2021   No Known Allergies      Medication List        Accurate as of January 07, 2021  3:05 PM. If you have any questions, ask your nurse or doctor.          albuterol 108 (90 Base) MCG/ACT inhaler Commonly known as: VENTOLIN HFA Inhale 2 puffs into the lungs every 6 (six) hours as needed for wheezing or shortness of breath.   aspirin EC 81 MG tablet Take 81 mg by mouth daily.   atorvastatin 20 MG tablet Commonly known as: LIPITOR Take 1 tablet by mouth daily.   Breztri Aerosphere 160-9-4.8 MCG/ACT Aero Generic drug: Budeson-Glycopyrrol-Formoterol Inhale 160 mcg into the lungs in the morning and at bedtime.   Breztri Aerosphere 160-9-4.8 MCG/ACT Aero Generic drug: Budeson-Glycopyrrol-Formoterol Inhale 2 puffs into the lungs in the morning and at bedtime.   finasteride 5 MG tablet Commonly known as: PROSCAR Take 5 mg by mouth every  morning.   loratadine 10 MG tablet Commonly known as: CLARITIN Take 10 mg by mouth daily.   metoprolol succinate 50 MG 24 hr tablet Commonly known as: TOPROL-XL Take 1 tablet by mouth daily.   pantoprazole 40 MG tablet Commonly known as: PROTONIX Take 1 tablet (40 mg total) by mouth 2 (two) times daily.   spironolactone 25 MG tablet Commonly known as:  ALDACTONE 1 tablet daily.   vitamin B-1 250 MG tablet Take 250 mg by mouth daily.        Allergies: No Known Allergies  Family History: Family History  Problem Relation Age of Onset   Heart disease Father    Aneurysm Sister     Social History:  reports that he has never smoked. He has never used smokeless tobacco. He reports that he does not currently use alcohol. He reports that he does not use drugs.   Physical Exam: BP (!) 143/93   Pulse 91   Ht 5\' 9"  (1.753 m)   Wt 160 lb (72.6 kg)   BMI 23.63 kg/m   Constitutional:  Alert and oriented, No acute distress. HEENT: [place] AT, moist mucus membranes.  Trachea midline, no masses. Cardiovascular: No clubbing, cyanosis, or edema. Respiratory: Normal respiratory effort, no increased work of breathing. Psychiatric: Normal mood and affect.    Assessment & Plan:    1. Benign prostatic hyperplasia, unspecified whether lower urinary tract symptoms present Doing well status post TURP Bladder scan PVR 8 mL  2.  History nephrolithiasis KUB today  3.  History of bladder calculus KUB  Continue annual follow-up   Abbie Sons, MD  [place] [address] [place], [place] [address] [phone]

## 2021-01-09 ENCOUNTER — Telehealth: Payer: Self-pay | Admitting: *Deleted

## 2021-01-09 NOTE — Telephone Encounter (Signed)
Notified patient as instructed, patient pleased. Discussed follow-up appointments, patient agrees  

## 2021-01-09 NOTE — Telephone Encounter (Signed)
-----   Message from Riki Altes, MD sent at 01/08/2021  5:07 PM EDT ----- KUB shows a stable left renal calculus.  No bladder stones identified

## 2021-05-14 DIAGNOSIS — Z79899 Other long term (current) drug therapy: Secondary | ICD-10-CM | POA: Diagnosis not present

## 2021-05-14 DIAGNOSIS — J439 Emphysema, unspecified: Secondary | ICD-10-CM | POA: Diagnosis not present

## 2021-05-14 DIAGNOSIS — I10 Essential (primary) hypertension: Secondary | ICD-10-CM | POA: Diagnosis not present

## 2021-05-14 DIAGNOSIS — Z0000 Encounter for general adult medical examination without abnormal findings: Secondary | ICD-10-CM | POA: Diagnosis not present

## 2021-05-14 DIAGNOSIS — I700 Atherosclerosis of aorta: Secondary | ICD-10-CM | POA: Diagnosis not present

## 2021-05-14 DIAGNOSIS — R739 Hyperglycemia, unspecified: Secondary | ICD-10-CM | POA: Diagnosis not present

## 2021-07-11 IMAGING — CR DG CHEST 2V
1 series · 2 of 2 positions shown · non-contrast
Comparison: 03/30/2019

CLINICAL DATA: Shortness of breath

EXAM:
CHEST - 2 VIEW

[Series 1: dg chest 2 view · 0.14mm/px · 2 of 2 slices shown]
[im 1/2]
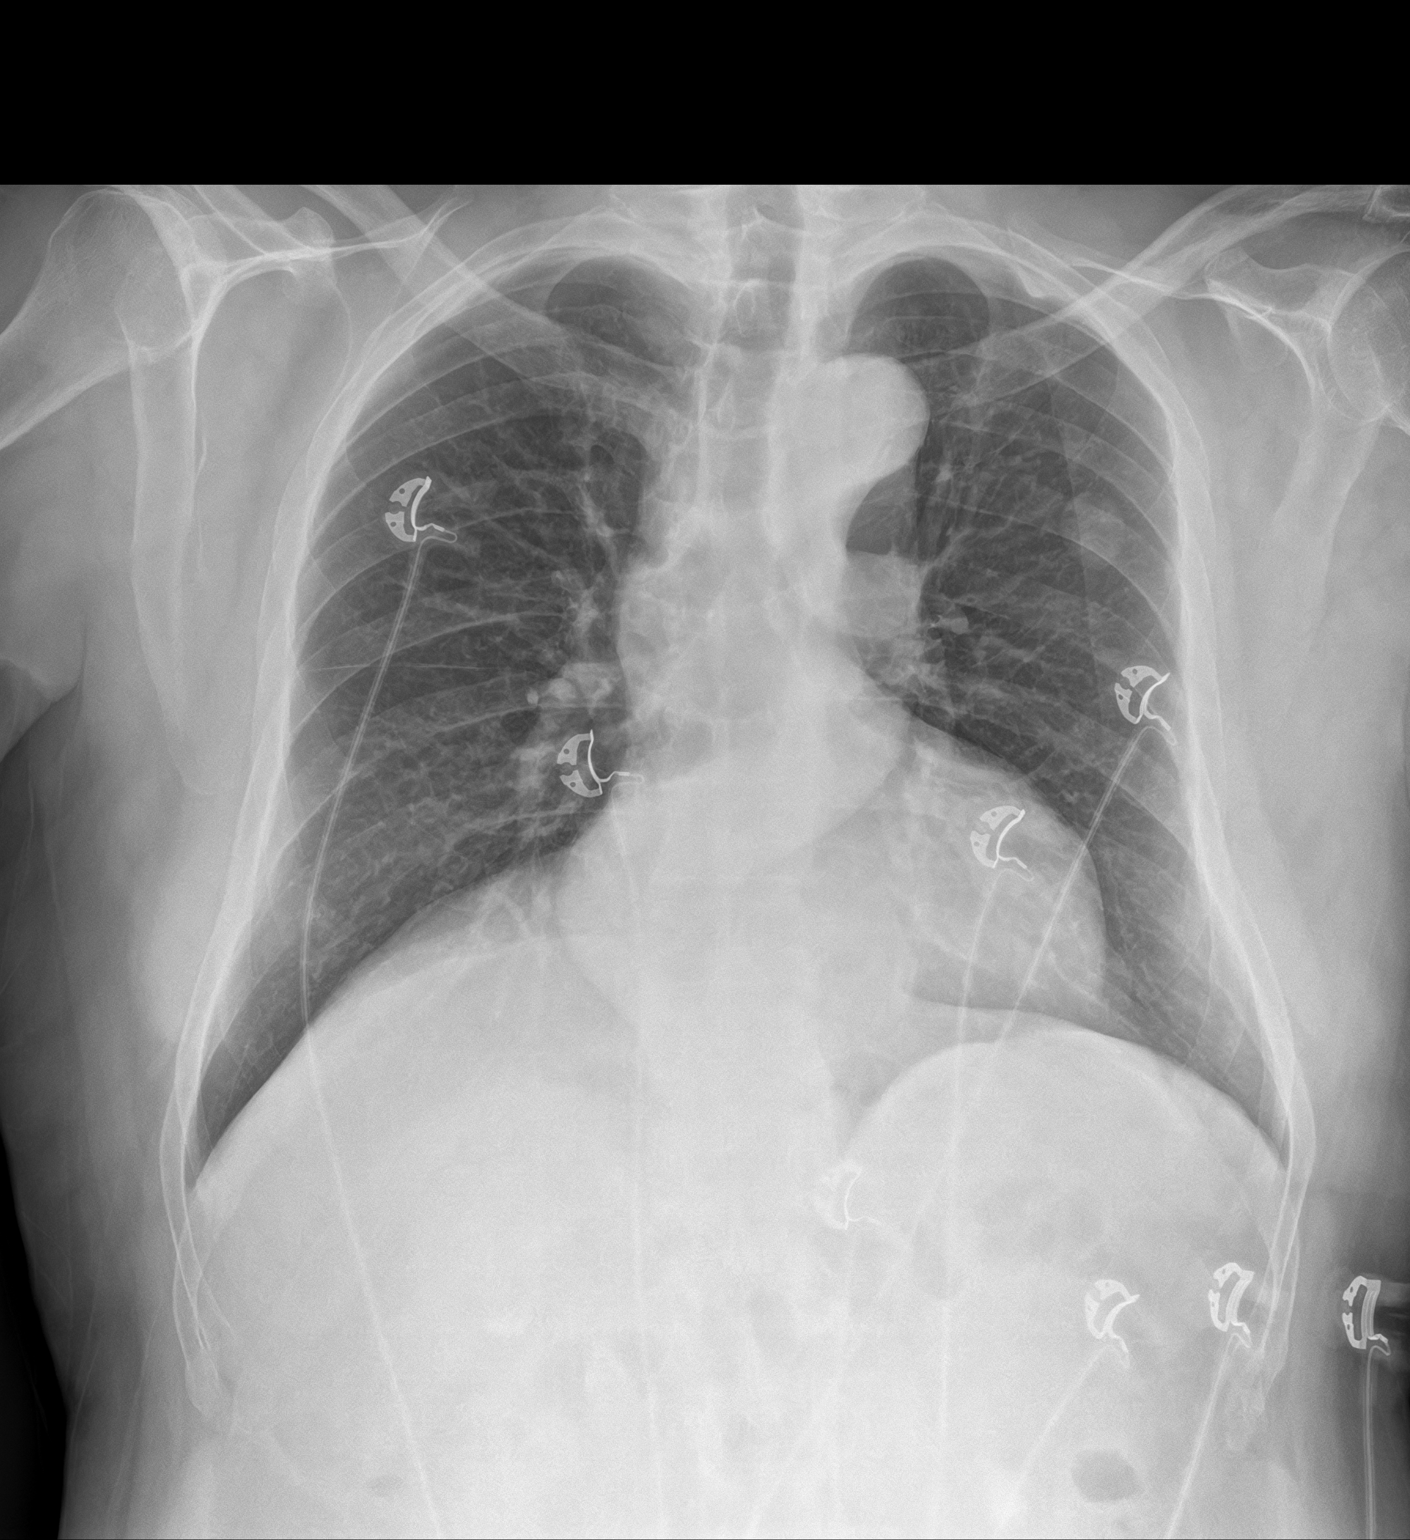
[im 2/2]
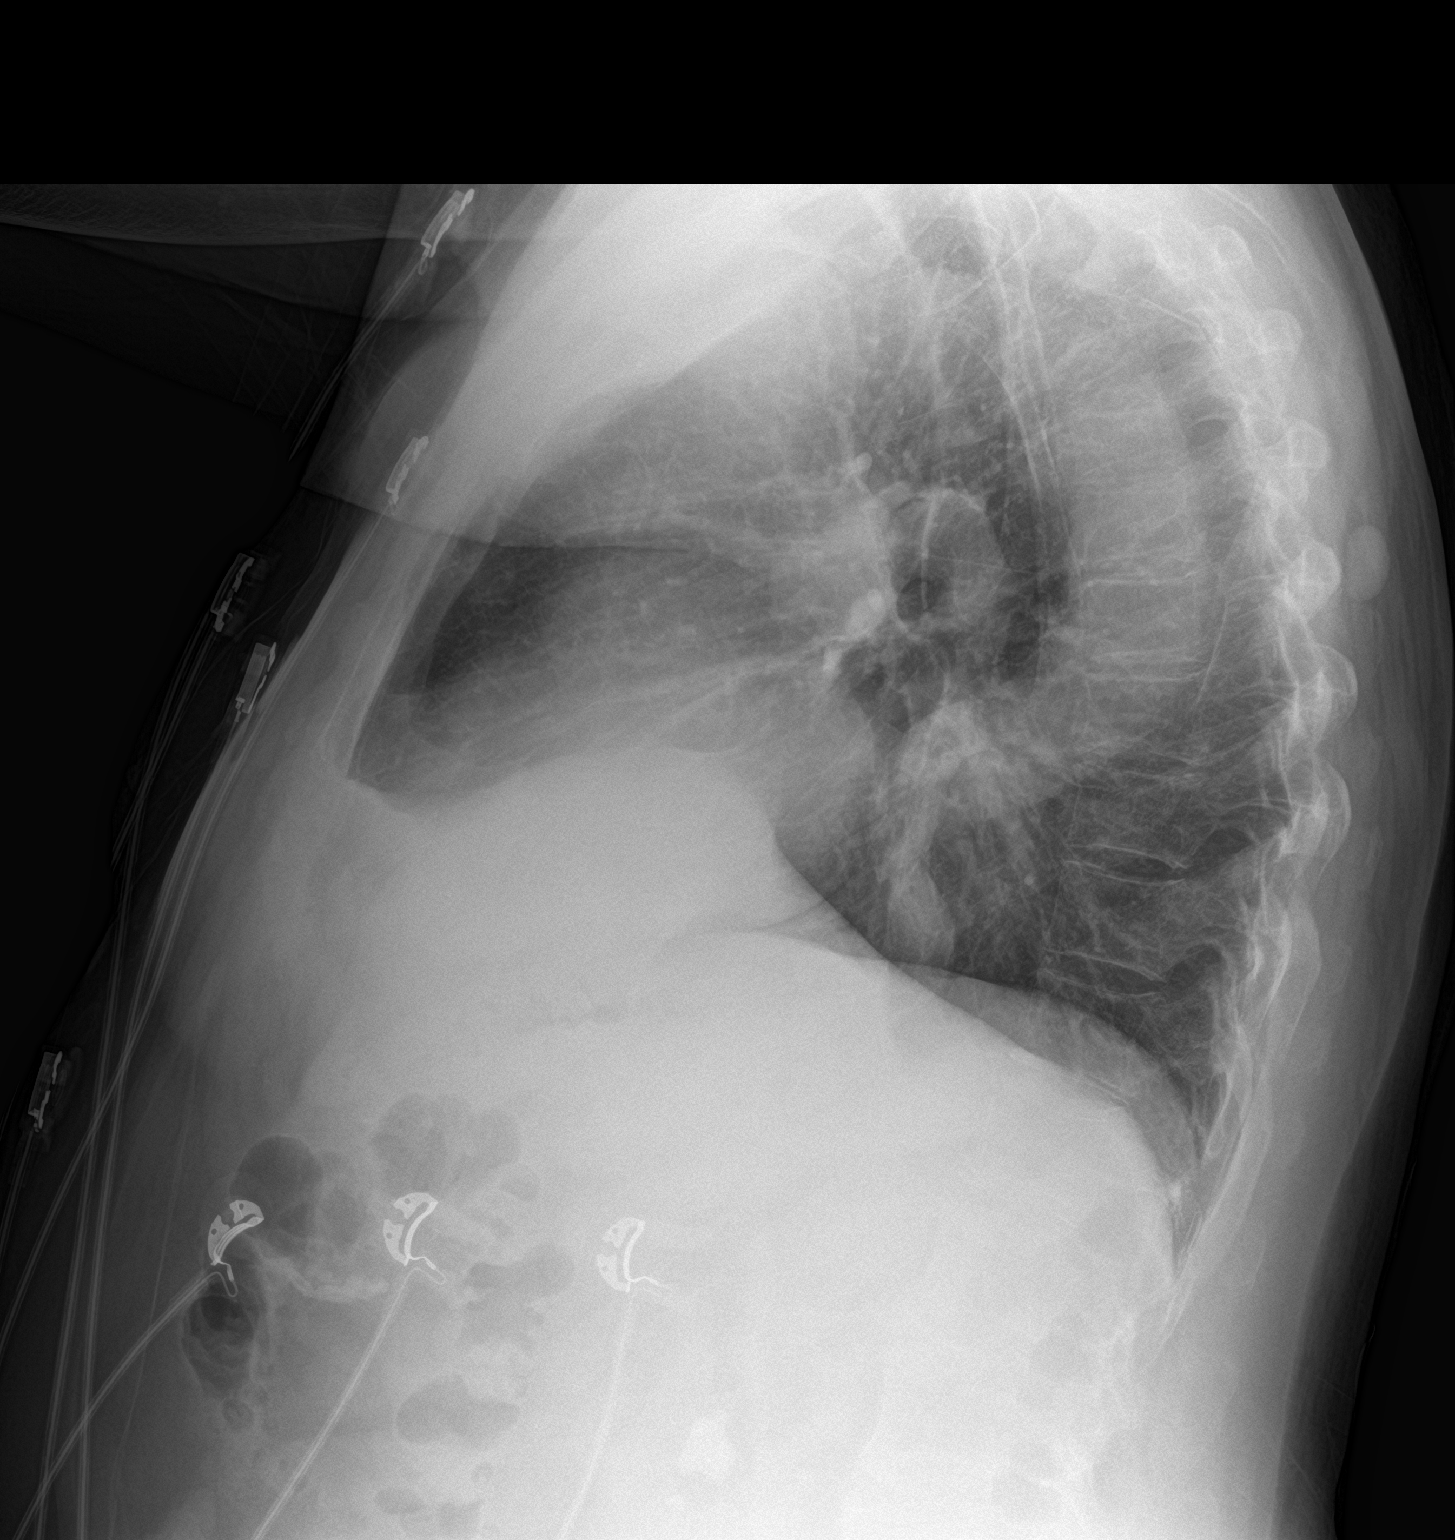

[2 of 2 positions shown; findings below may reference images not displayed]

FINDINGS: The heart size is normal. Tortuous thoracic aorta.
Well-circumscribed nodule measuring approximately 2.0 cm projects
over the left upper lobe on frontal view and evidently within the
posterior soft tissues on lateral view; this is not seen on prior
radiographs. The visualized skeletal structures are unremarkable.
IMPRESSION: 1. No acute abnormality of the lungs.
2. Well-circumscribed nodule measuring approximately 2.0 cm projects
over the left upper lobe on frontal view and evidently within the
posterior soft tissues of lateral view; this is not seen on prior
radiographs. Correlate for palpable abnormality overlying the left
scapula; consider CT of the chest to evaluate suspected pulmonary
nodule on a nonemergent basis.

## 2021-07-20 ENCOUNTER — Inpatient Hospital Stay
Admission: EM | Admit: 2021-07-20 | Discharge: 2021-07-25 | DRG: 872 | Disposition: A | Discharge status: Home / Self Care | Payer: Medicare HMO | Attending: Internal Medicine | Admitting: Internal Medicine

## 2021-07-20 ENCOUNTER — Other Ambulatory Visit: Payer: Self-pay

## 2021-07-20 ENCOUNTER — Encounter: Payer: Self-pay | Admitting: Emergency Medicine

## 2021-07-20 ENCOUNTER — Emergency Department: Payer: Medicare HMO

## 2021-07-20 DIAGNOSIS — A419 Sepsis, unspecified organism: Secondary | ICD-10-CM | POA: Diagnosis not present

## 2021-07-20 DIAGNOSIS — E785 Hyperlipidemia, unspecified: Secondary | ICD-10-CM | POA: Diagnosis present

## 2021-07-20 DIAGNOSIS — A4153 Sepsis due to Serratia: Secondary | ICD-10-CM | POA: Diagnosis present

## 2021-07-20 DIAGNOSIS — K219 Gastro-esophageal reflux disease without esophagitis: Secondary | ICD-10-CM | POA: Diagnosis present

## 2021-07-20 DIAGNOSIS — R7303 Prediabetes: Secondary | ICD-10-CM | POA: Diagnosis present

## 2021-07-20 DIAGNOSIS — E876 Hypokalemia: Secondary | ICD-10-CM | POA: Diagnosis not present

## 2021-07-20 DIAGNOSIS — I480 Paroxysmal atrial fibrillation: Secondary | ICD-10-CM | POA: Diagnosis present

## 2021-07-20 DIAGNOSIS — N453 Epididymo-orchitis: Secondary | ICD-10-CM | POA: Diagnosis present

## 2021-07-20 DIAGNOSIS — A498 Other bacterial infections of unspecified site: Secondary | ICD-10-CM | POA: Diagnosis not present

## 2021-07-20 DIAGNOSIS — N201 Calculus of ureter: Secondary | ICD-10-CM | POA: Diagnosis present

## 2021-07-20 DIAGNOSIS — N390 Urinary tract infection, site not specified: Secondary | ICD-10-CM | POA: Diagnosis present

## 2021-07-20 DIAGNOSIS — Z7982 Long term (current) use of aspirin: Secondary | ICD-10-CM

## 2021-07-20 DIAGNOSIS — Z9079 Acquired absence of other genital organ(s): Secondary | ICD-10-CM | POA: Diagnosis not present

## 2021-07-20 DIAGNOSIS — M79605 Pain in left leg: Secondary | ICD-10-CM | POA: Diagnosis present

## 2021-07-20 DIAGNOSIS — N401 Enlarged prostate with lower urinary tract symptoms: Secondary | ICD-10-CM | POA: Diagnosis present

## 2021-07-20 DIAGNOSIS — N451 Epididymitis: Secondary | ICD-10-CM

## 2021-07-20 DIAGNOSIS — Z87442 Personal history of urinary calculi: Secondary | ICD-10-CM | POA: Diagnosis not present

## 2021-07-20 DIAGNOSIS — D649 Anemia, unspecified: Secondary | ICD-10-CM | POA: Diagnosis present

## 2021-07-20 DIAGNOSIS — N400 Enlarged prostate without lower urinary tract symptoms: Secondary | ICD-10-CM

## 2021-07-20 DIAGNOSIS — N202 Calculus of kidney with calculus of ureter: Secondary | ICD-10-CM | POA: Diagnosis present

## 2021-07-20 DIAGNOSIS — I10 Essential (primary) hypertension: Secondary | ICD-10-CM | POA: Diagnosis present

## 2021-07-20 DIAGNOSIS — Z8249 Family history of ischemic heart disease and other diseases of the circulatory system: Secondary | ICD-10-CM

## 2021-07-20 DIAGNOSIS — F1010 Alcohol abuse, uncomplicated: Secondary | ICD-10-CM | POA: Diagnosis present

## 2021-07-20 DIAGNOSIS — N200 Calculus of kidney: Secondary | ICD-10-CM

## 2021-07-20 DIAGNOSIS — Z79899 Other long term (current) drug therapy: Secondary | ICD-10-CM | POA: Diagnosis not present

## 2021-07-20 LAB — COMPREHENSIVE METABOLIC PANEL
ALT: 27 U/L (ref 0–44)
AST: 40 U/L (ref 15–41)
Albumin: 3.5 g/dL (ref 3.5–5.0)
Alkaline Phosphatase: 118 U/L (ref 38–126)
Anion gap: 8 (ref 5–15)
BUN: 9 mg/dL (ref 8–23)
CO2: 22 mmol/L (ref 22–32)
Calcium: 8.5 mg/dL — ABNORMAL LOW (ref 8.9–10.3)
Chloride: 106 mmol/L (ref 98–111)
Creatinine, Ser: 1.07 mg/dL (ref 0.61–1.24)
GFR, Estimated: >60 mL/min (ref >60)
Glucose, Bld: 137 mg/dL — ABNORMAL HIGH (ref 70–99)
Potassium: 3.6 mmol/L (ref 3.5–5.1)
Sodium: 136 mmol/L (ref 135–145)
Total Bilirubin: 1.4 mg/dL — ABNORMAL HIGH (ref 0.3–1.2)
Total Protein: 7.6 g/dL (ref 6.5–8.1)

## 2021-07-20 LAB — URINALYSIS, ROUTINE W REFLEX MICROSCOPIC
Bilirubin Urine: NEGATIVE
Glucose, UA: NEGATIVE mg/dL
Ketones, ur: NEGATIVE mg/dL
Nitrite: POSITIVE — AB
Protein, ur: NEGATIVE mg/dL
Specific Gravity, Urine: >1.046 — ABNORMAL HIGH (ref 1.005–1.030)
WBC, UA: >50 WBC/hpf — ABNORMAL HIGH (ref 0–5)
pH: 5 (ref 5.0–8.0)

## 2021-07-20 LAB — CBC
HCT: 31.1 % — ABNORMAL LOW (ref 39.0–52.0)
Hemoglobin: 9.9 g/dL — ABNORMAL LOW (ref 13.0–17.0)
MCH: 31 pg (ref 26.0–34.0)
MCHC: 31.8 g/dL (ref 30.0–36.0)
MCV: 97.5 fL (ref 80.0–100.0)
Platelets: 180 10*3/uL (ref 150–400)
RBC: 3.19 MIL/uL — ABNORMAL LOW (ref 4.22–5.81)
RDW: 16.9 % — ABNORMAL HIGH (ref 11.5–15.5)
WBC: 18.1 10*3/uL — ABNORMAL HIGH (ref 4.0–10.5)
nRBC: 0 % (ref 0.0–0.2)

## 2021-07-20 LAB — LACTIC ACID, PLASMA
Lactic Acid, Venous: 1.8 mmol/L (ref 0.5–1.9)
Lactic Acid, Venous: 1.5 mmol/L (ref 0.5–1.9)
Lactic Acid, Venous: 1.6 mmol/L (ref 0.5–1.9)
Lactic Acid, Venous: 1.4 mmol/L (ref 0.5–1.9)

## 2021-07-20 LAB — HEMOGLOBIN A1C
Hgb A1c MFr Bld: 6 % — ABNORMAL HIGH (ref 4.8–5.6)
Mean Plasma Glucose: 125.5 mg/dL

## 2021-07-20 LAB — C-REACTIVE PROTEIN: CRP: 18.3 mg/dL — ABNORMAL HIGH (ref <1.0)

## 2021-07-20 LAB — LIPASE, BLOOD: Lipase: 33 U/L (ref 11–51)

## 2021-07-20 LAB — PROCALCITONIN: Procalcitonin: 0.18 ng/mL

## 2021-07-20 LAB — ETHANOL: Alcohol, Ethyl (B): <10 mg/dL (ref <10)

## 2021-07-20 MED ORDER — MORPHINE SULFATE (PF) 4 MG/ML IV SOLN
4.0000 mg | Freq: Once | INTRAVENOUS | Status: AC
  Administered 2021-07-20: 4 mg via INTRAVENOUS
  Filled 2021-07-20: qty 1

## 2021-07-20 MED ORDER — ASPIRIN EC 81 MG PO TBEC
81.0000 mg | DELAYED_RELEASE_TABLET | Freq: Every day | ORAL | Status: DC
  Administered 2021-07-20 – 2021-07-25 (×6): 81 mg via ORAL
  Filled 2021-07-20 (×6): qty 1

## 2021-07-20 MED ORDER — SODIUM CHLORIDE 0.9 % IV SOLN
2.0000 g | INTRAVENOUS | Status: DC
  Administered 2021-07-20: 2 g via INTRAVENOUS
  Filled 2021-07-20: qty 20

## 2021-07-20 MED ORDER — SODIUM CHLORIDE 0.9 % IV SOLN
1.0000 g | INTRAVENOUS | Status: DC
  Filled 2021-07-20: qty 10

## 2021-07-20 MED ORDER — PANTOPRAZOLE SODIUM 40 MG PO TBEC
40.0000 mg | DELAYED_RELEASE_TABLET | Freq: Two times a day (BID) | ORAL | Status: DC
  Administered 2021-07-20 – 2021-07-25 (×10): 40 mg via ORAL
  Filled 2021-07-20 (×10): qty 1

## 2021-07-20 MED ORDER — THIAMINE HCL 100 MG PO TABS
100.0000 mg | ORAL_TABLET | Freq: Every day | ORAL | Status: DC
  Administered 2021-07-20 – 2021-07-25 (×5): 100 mg via ORAL
  Filled 2021-07-20 (×5): qty 1

## 2021-07-20 MED ORDER — LORATADINE 10 MG PO TABS
10.0000 mg | ORAL_TABLET | Freq: Every day | ORAL | Status: DC
Start: 2021-07-20 — End: 2021-07-25
  Administered 2021-07-20 – 2021-07-25 (×6): 10 mg via ORAL
  Filled 2021-07-20 (×6): qty 1

## 2021-07-20 MED ORDER — ENOXAPARIN SODIUM 40 MG/0.4ML IJ SOSY
40.0000 mg | PREFILLED_SYRINGE | INTRAMUSCULAR | Status: DC
  Administered 2021-07-20 – 2021-07-24 (×5): 40 mg via SUBCUTANEOUS
  Filled 2021-07-20 (×5): qty 0.4

## 2021-07-20 MED ORDER — METOPROLOL TARTRATE 5 MG/5ML IV SOLN: 2.5000 mg | Freq: Four times a day (QID) | INTRAVENOUS | Status: DC | PRN

## 2021-07-20 MED ORDER — ACETAMINOPHEN 325 MG PO TABS
650.0000 mg | ORAL_TABLET | Freq: Four times a day (QID) | ORAL | Status: DC | PRN
  Administered 2021-07-22 – 2021-07-24 (×4): 650 mg via ORAL
  Filled 2021-07-20 (×4): qty 2

## 2021-07-20 MED ORDER — ONDANSETRON HCL 4 MG/2ML IJ SOLN: 4.0000 mg | Freq: Four times a day (QID) | INTRAMUSCULAR | Status: DC | PRN

## 2021-07-20 MED ORDER — LORAZEPAM 2 MG/ML IJ SOLN: 1.0000 mg | INTRAMUSCULAR | Status: AC | PRN

## 2021-07-20 MED ORDER — SODIUM CHLORIDE 0.9 % IV BOLUS
500.0000 mL | Freq: Once | INTRAVENOUS | Status: AC
  Administered 2021-07-20: 500 mL via INTRAVENOUS

## 2021-07-20 MED ORDER — HYDROCODONE-ACETAMINOPHEN 5-325 MG PO TABS
1.0000 | ORAL_TABLET | ORAL | Status: DC | PRN
  Administered 2021-07-21: 1 via ORAL
  Administered 2021-07-21 – 2021-07-22 (×2): 2 via ORAL
  Filled 2021-07-20: qty 1
  Filled 2021-07-20 (×2): qty 2

## 2021-07-20 MED ORDER — METOPROLOL SUCCINATE ER 50 MG PO TB24
50.0000 mg | ORAL_TABLET | Freq: Every day | ORAL | Status: DC
  Administered 2021-07-20 – 2021-07-22 (×3): 50 mg via ORAL
  Filled 2021-07-20 (×3): qty 1

## 2021-07-20 MED ORDER — SODIUM CHLORIDE 0.9 % IV SOLN: INTRAVENOUS | Status: AC

## 2021-07-20 MED ORDER — LEVOFLOXACIN IN D5W 500 MG/100ML IV SOLN
500.0000 mg | INTRAVENOUS | Status: DC
  Administered 2021-07-21 – 2021-07-22 (×2): 500 mg via INTRAVENOUS
  Filled 2021-07-20 (×2): qty 100

## 2021-07-20 MED ORDER — MOMETASONE FURO-FORMOTEROL FUM 200-5 MCG/ACT IN AERO
2.0000 | INHALATION_SPRAY | Freq: Two times a day (BID) | RESPIRATORY_TRACT | Status: DC
  Administered 2021-07-21 – 2021-07-25 (×8): 2 via RESPIRATORY_TRACT
  Filled 2021-07-20: qty 8.8

## 2021-07-20 MED ORDER — ONDANSETRON HCL 4 MG PO TABS: 4.0000 mg | ORAL_TABLET | Freq: Four times a day (QID) | ORAL | Status: DC | PRN

## 2021-07-20 MED ORDER — IOHEXOL 300 MG/ML  SOLN
100.0000 mL | Freq: Once | INTRAMUSCULAR | Status: AC | PRN
Start: 2021-07-20 — End: 2021-07-20
  Administered 2021-07-20: 100 mL via INTRAVENOUS

## 2021-07-20 MED ORDER — ONDANSETRON HCL 4 MG/2ML IJ SOLN
4.0000 mg | Freq: Once | INTRAMUSCULAR | Status: AC
  Administered 2021-07-20: 4 mg via INTRAVENOUS
  Filled 2021-07-20: qty 2

## 2021-07-20 MED ORDER — FOLIC ACID 1 MG PO TABS
1.0000 mg | ORAL_TABLET | Freq: Every day | ORAL | Status: DC
  Administered 2021-07-20 – 2021-07-25 (×6): 1 mg via ORAL
  Filled 2021-07-20 (×6): qty 1

## 2021-07-20 MED ORDER — ATORVASTATIN CALCIUM 20 MG PO TABS
20.0000 mg | ORAL_TABLET | Freq: Every day | ORAL | Status: DC
  Administered 2021-07-20 – 2021-07-25 (×6): 20 mg via ORAL
  Filled 2021-07-20 (×6): qty 1

## 2021-07-20 MED ORDER — LORAZEPAM 1 MG PO TABS: 1.0000 mg | ORAL_TABLET | ORAL | Status: AC | PRN

## 2021-07-20 MED ORDER — FINASTERIDE 5 MG PO TABS
5.0000 mg | ORAL_TABLET | Freq: Every day | ORAL | Status: DC
Start: 2021-07-20 — End: 2021-07-25
  Administered 2021-07-20 – 2021-07-25 (×6): 5 mg via ORAL
  Filled 2021-07-20 (×6): qty 1

## 2021-07-20 MED ORDER — SPIRONOLACTONE 25 MG PO TABS
25.0000 mg | ORAL_TABLET | Freq: Every day | ORAL | Status: DC
  Administered 2021-07-20 – 2021-07-25 (×6): 25 mg via ORAL
  Filled 2021-07-20 (×6): qty 1

## 2021-07-20 MED ORDER — THIAMINE HCL 100 MG/ML IJ SOLN
100.0000 mg | Freq: Every day | INTRAMUSCULAR | Status: DC
  Administered 2021-07-22: 100 mg via INTRAVENOUS
  Filled 2021-07-20 (×2): qty 2

## 2021-07-20 MED ORDER — THIAMINE HCL 100 MG PO TABS: 250.0000 mg | ORAL_TABLET | Freq: Every day | ORAL | Status: DC

## 2021-07-20 MED ORDER — ALBUTEROL SULFATE (2.5 MG/3ML) 0.083% IN NEBU
3.0000 mL | INHALATION_SOLUTION | Freq: Four times a day (QID) | RESPIRATORY_TRACT | Status: DC | PRN
  Administered 2021-07-23 – 2021-07-24 (×2): 3 mL via RESPIRATORY_TRACT
  Filled 2021-07-20 (×2): qty 3

## 2021-07-20 MED ORDER — LEVOFLOXACIN IN D5W 500 MG/100ML IV SOLN
500.0000 mg | Freq: Once | INTRAVENOUS | Status: AC
  Administered 2021-07-20: 500 mg via INTRAVENOUS
  Filled 2021-07-20: qty 100

## 2021-07-20 MED ORDER — ACETAMINOPHEN 650 MG RE SUPP: 650.0000 mg | Freq: Four times a day (QID) | RECTAL | Status: DC | PRN

## 2021-07-20 MED ORDER — LACTATED RINGERS IV BOLUS
1000.0000 mL | Freq: Once | INTRAVENOUS | Status: AC
  Administered 2021-07-20: 1000 mL via INTRAVENOUS

## 2021-07-20 MED ORDER — ADULT MULTIVITAMIN W/MINERALS CH
1.0000 | ORAL_TABLET | Freq: Every day | ORAL | Status: DC
  Administered 2021-07-20 – 2021-07-24 (×6): 1 via ORAL
  Filled 2021-07-20 (×6): qty 1

## 2021-07-20 NOTE — ED Notes (Signed)
Received report from Joy RN, Pt has bed assignment, transport set up, pt to be taken to the floor. ?

## 2021-07-20 NOTE — H&P (Signed)
?History and Physical  ? ? ?MAUI BRITTEN ACZ:[id number] DOB: 04-30-1950 DOA: 07/20/2021 ? ?PCP: Louis Matte, MD  ?Patient coming from: home ? ?I have personally briefly reviewed patient's old medical records in [facility] ? ?Chief Complaint: scrotal pain  ? ?HPI: Marvin Ortega is a 71 y.o. male with medical history significant of ETOH liver disease, anemia ,  P atrial fib not on AC due to presumed ETOH use, BPH, COPD , GERD, hx of gi bleed, HTN , prediabetes who presents to ed with complaint of scrotal pain that started day prior to presentation. He notes left and right scrotum swollen and tender. He also note associated flank pain with this as well. He denies n/v/d/ or belly pain. He notes he had difficulty urinating but notes no pain. He denies fever or chills.  On further ros no sob/ chest pain / or presyncope.  He currently states he still has pain but it is some what improved.  ? ? ?ED Course: ?Afeb, bp 153/105, hr 123, sat 95%  ?Labs ?Wbc 18, plt180, hgb 9.9 ?Na136, glu 137, cr 1.07 ? ?U/s scrotal: ?IMPRESSION: ?1. Heterogeneous echotexture and hyperemia of the left epididymis ?with slight enlargement of the left epididymis. Imaging features are ?most suggestive of left epididymitis. ?2. Complex fluid in the left hemiscrotum with numerous thin ?septations. This may be related to a pyocele or hematocele. ?3. Right testicle remains smaller than the left with heterogeneous ?echotexture of the right testicle again noted but no discrete mass ?lesion identified. The right testicle has decreased in size since ?the prior exam. ?4. Bilateral epididymal cysts or spermatoceles. ?5. No evidence for testicular torsion. ? ?Tx: LR 1L morphine 4mg   ?Review of Systems: As per HPI otherwise 10 point review of systems negative.  ? ?Past Medical History:  ?Diagnosis Date  ? Alcoholic cirrhosis of liver (HCC)   ? Anemia   ? Atrial fibrillation (HCC)   ? Atrial fibrillation (HCC)   ? BPH (benign prostatic  hyperplasia)   ? COPD (chronic obstructive pulmonary disease) (HCC)   ? Dieulafoy lesion of stomach   ? GERD (gastroesophageal reflux disease)   ? h/o  ? GIB (gastrointestinal bleeding) 02/15/2018  ? Hemorrhagic shock (HCC)   ? History of kidney stones   ? Hypertension   ? Pre-diabetes   ? Sepsis (HCC)   ? Upper GI bleed   ? ? ?Past Surgical History:  ?Procedure Laterality Date  ? CYSTOSCOPY W/ RETROGRADES Right 11/23/2018  ? Procedure: CYSTOSCOPY WITH RETROGRADE PYELOGRAM;  Surgeon: 11/25/2018, MD;  Location: [facility] OR[facility];  Service: Urology;  Laterality: Right;  ? CYSTOSCOPY WITH HOLMIUM LASER LITHOTRIPSY  11/30/2018  ? Procedure: CYSTOSCOPY WITH HOLMIUM LASER LITHOTRIPSY;  Surgeon: 12/02/2018, MD;  Location: [facility] OR[facility];  Service: Urology;;  ? CYSTOSCOPY WITH LITHOLAPAXY N/A 11/23/2018  ? Procedure: CYSTOSCOPY WITH LITHOLAPAXY;  Surgeon: 11/25/2018, MD;  Location: [facility];  Service: Urology;  Laterality: N/A;  ? CYSTOSCOPY WITH LITHOLAPAXY N/A 11/30/2018  ? Procedure: CYSTOSCOPY WITH LITHOLAPAXY;  Surgeon: 12/02/2018, MD;  Location: [facility];  Service: Urology;  Laterality: N/A;  ? CYSTOSCOPY/URETEROSCOPY/HOLMIUM LASER/STENT PLACEMENT Right 11/23/2018  ? Procedure: CYSTOSCOPY/URETEROSCOPY/HOLMIUM LASER/STENT PLACEMENT;  Surgeon: 11/25/2018, MD;  Location: [facility];  Service: Urology;  Laterality: Right;  ? ESOPHAGOGASTRODUODENOSCOPY N/A 02/15/2018  ? Procedure: ESOPHAGOGASTRODUODENOSCOPY (EGD);  Surgeon: 14/11/2017, MD;  Location: [facility] ENDOSCOPY;  Service: Gastroenterology;  Laterality: N/A;  ? TRANSURETHRAL RESECTION OF PROSTATE N/A  05/24/2019  ? Procedure: TRANSURETHRAL RESECTION OF THE PROSTATE (TURP);  Surgeon: Riki Altes, MD;  Location: [facility] OR[facility];  Service: Urology;  Laterality: N/A;  ? ? ? reports that he has never smoked. He has never used smokeless tobacco. He reports that he does not currently use alcohol. He reports that he does not use drugs. ? ?No Known  Allergies ? ?Family History  ?Problem Relation Age of Onset  ? Heart disease Father   ? Aneurysm Sister   ? ? ?Prior to Admission medications   ?Medication Sig Start Date End Date Taking? Authorizing Provider  ?albuterol (VENTOLIN HFA) 108 (90 Base) MCG/ACT inhaler Inhale 2 puffs into the lungs every 6 (six) hours as needed for wheezing or shortness of breath. 11/07/20   Salena Saner, MD  ?aspirin EC 81 MG tablet Take 81 mg by mouth daily.    [provider]  ?atorvastatin (LIPITOR) 20 MG tablet Take 1 tablet by mouth daily. 08/02/20 08/02/21  [provider]  ?Budeson-Glycopyrrol-Formoterol (BREZTRI AEROSPHERE) 160-9-4.8 MCG/ACT AERO Inhale 2 puffs into the lungs in the morning and at bedtime. 11/28/20   Salena Saner, MD  ?Budeson-Glycopyrrol-Formoterol (BREZTRI AEROSPHERE) 160-9-4.8 MCG/ACT AERO Inhale 160 mcg into the lungs in the morning and at bedtime. 11/07/20   Salena Saner, MD  ?finasteride (PROSCAR) 5 MG tablet Take 5 mg by mouth every morning.     [provider]  ?loratadine (CLARITIN) 10 MG tablet Take 10 mg by mouth daily.    [provider]  ?metoprolol succinate (TOPROL-XL) 50 MG 24 hr tablet Take 1 tablet by mouth daily. 03/23/20   [provider]  ?pantoprazole (PROTONIX) 40 MG tablet Take 1 tablet (40 mg total) by mouth 2 (two) times daily. 02/19/18   Mayo, Allyn Kenner, MD  ?spironolactone (ALDACTONE) 25 MG tablet 1 tablet daily. 06/28/20   [provider]  ?Thiamine HCl (VITAMIN B-1) 250 MG tablet Take 250 mg by mouth daily.    [provider]  ? ? ?Physical Exam: ?Vitals:  ? 07/20/21 0830 07/20/21 0832 07/20/21 1100  ?BP:  (!) 153/105 (!) 150/94  ?Pulse:  (!) 123 (!) 106  ?Resp:   17  ?Temp:  98.2 ?F (36.8 ?C)   ?SpO2:  95% 96%  ?Weight: 73 kg    ?Height: 5\' 9"  (1.753 m)    ? ? ? ?Vitals:  ? 07/20/21 0830 07/20/21 0832 07/20/21 1100  ?BP:  (!) 153/105 (!) 150/94  ?Pulse:  (!) 123 (!) 106  ?Resp:   17  ?Temp:  98.2 ?F (36.8  ?C)   ?SpO2:  95% 96%  ?Weight: 73 kg    ?Height: 5\' 9"  (1.753 m)    ?Constitutional: NAD, calm, comfortable ?Eyes: PERRL, lids and conjunctivae normal ?ENMT: Mucous membranes are moist. Posterior pharynx clear of any exudate or lesions.poor dentition.  ?Neck: normal, supple, no masses, no thyromegaly ?Respiratory: clear to auscultation bilaterally, no wheezing, no crackles. Normal respiratory effort. No accessory muscle use.  ?Cardiovascular: Regular rate and rhythm, no murmurs / rubs / gallops. No extremity edema. 2+ pedal pulses.  ?Abdomen: no tenderness, no masses palpated. No hepatosplenomegaly. Bowel sounds positive.  ?Musculoskeletal: no clubbing / cyanosis. No joint deformity upper and lower extremities. Good ROM, no contractures. Normal muscle tone.  ?Skin: no rashes, lesions, ulcers. No induration ?Neurologic: CN 2-12 grossly intact. Sensation intact, Strength 5/5 in all 4.  ?Psychiatric: Normal judgment and insight. Alert and oriented x 3. Normal mood.  ?GU: swollen scrotum ,  tender  to touch mild erythema. ? ? ?Labs on Admission: I have personally reviewed following labs and imaging studies ? ?CBC: ?Recent Labs  ?Lab 07/20/21 ?1010  ?WBC 18.1*  ?HGB 9.9*  ?HCT 31.1*  ?MCV 97.5  ?PLT 180  ? ?Basic Metabolic Panel: ?Recent Labs  ?Lab 07/20/21 ?1010  ?NA 136  ?K 3.6  ?CL 106  ?CO2 22  ?GLUCOSE 137*  ?BUN 9  ?CREATININE 1.07  ?CALCIUM 8.5*  ? ?GFR: ?Estimated Creatinine Clearance: 62.4 mL/min (by C-G formula based on SCr of 1.07 mg/dL). ?Liver Function Tests: ?Recent Labs  ?Lab 07/20/21 ?1010  ?AST 40  ?ALT 27  ?ALKPHOS 118  ?BILITOT 1.4*  ?PROT 7.6  ?ALBUMIN 3.5  ? ?Recent Labs  ?Lab 07/20/21 ?1010  ?LIPASE 33  ? ?No results for input(s): AMMONIA in the last 168 hours. ?Coagulation Profile: ?No results for input(s): INR, PROTIME in the last 168 hours. ?Cardiac Enzymes: ?No results for input(s): CKTOTAL, CKMB, CKMBINDEX, TROPONINI in the last 168 hours. ?BNP (last 3 results) ?No results for input(s): PROBNP  in the last 8760 hours. ?HbA1C: ?No results for input(s): HGBA1C in the last 72 hours. ?CBG: ?No results for input(s): GLUCAP in the last 168 hours. ?Lipid Profile: ?No results for input(s): CHOL, HDL, LDLCALC, TRIG, CHOLH

## 2021-07-20 NOTE — ED Notes (Addendum)
Bladder scan performed: 26 ml ?

## 2021-07-20 NOTE — ED Triage Notes (Signed)
Pt reports has some kidney issues and over the last week or more he has felt uncomfortable in his abd and back. Pt reports has not been able to fully empty his bladder in the past 24 hours. Pt describes the pain as achy and uncomfortable.  ?

## 2021-07-20 NOTE — Progress Notes (Signed)
PHARMACY NOTE:  ANTIMICROBIAL RENAL DOSAGE ADJUSTMENT ? ?Current antimicrobial regimen includes a mismatch between antimicrobial dosage and estimated renal function.  As per policy approved by the Pharmacy & Therapeutics and Medical Executive Committees, the antimicrobial dosage will be adjusted accordingly. ? ?Current antimicrobial dosage:  Ceftriaxone 2 g IV Q24H ? ?Indication: Epididymitis ? ?Renal Function: ? ?Estimated Creatinine Clearance: 62.4 mL/min (by C-G formula based on SCr of 1.07 mg/dL). ?[]      On intermittent HD, scheduled: ?[]      On CRRT ?   ?Antimicrobial dosage has been changed to:  Ceftriaxone 1 g IV Q24H ? ?Additional comments: ? ? ?Thank you for allowing pharmacy to be a part of this patient's care. ? ? , [facility] ?07/20/2021 7:33 PM ?

## 2021-07-20 NOTE — ED Provider Notes (Signed)
? ?[facility] ?Provider Note ? ? ? Event Date/Time  ? First MD Initiated Contact with Patient 07/20/21 [phone]   ?  (approximate) ? ? ?History  ? ?Chief Complaint ?Abdominal Pain and Back Pain ? ? ?HPI ? ?Marvin Ortega is a 71 y.o. male with past medical history of hypertension, hyperlipidemia, COPD, BPH, paroxysmal atrial fibrillation, kidney stones, and alcoholic cirrhosis who presents to the ED complaining of abdominal and testicular pain.  Patient reports that yesterday morning he began to notice painful swelling in his scrotum along with pain both sides of his abdomen.  He describes the pain as constant and dull, not exacerbated by anything.  Pain is worse in his left middle quadrant into his abdomen, also seems to radiate towards both of his flanks.  He states it has been difficult for him to urinate, only producing a few drops of urine at a time.  This has been going on chronically for greater than 1 year but has worsened with the onset of pain over the past 24 hours.  He denies any dysuria or dysuria, states his bowel movements have been normal.  He has not had any chills, vomiting, cough, chest pain, or shortness of breath. ?  ? ? ?Physical Exam  ? ?Triage Vital Signs: ?ED Triage Vitals  ?Enc Vitals Group  ?   BP 07/20/21 0832 (!) 153/105  ?   Pulse Rate 07/20/21 0832 (!) 123  ?   Resp --   ?   Temp 07/20/21 0832 98.2 ?F (36.8 ?C)  ?   Temp src --   ?   SpO2 07/20/21 0832 95 %  ?   Weight 07/20/21 0830 160 lb 15 oz (73 kg)  ?   Height 07/20/21 0830 5\' 9"  (1.753 m)  ?   Head Circumference --   ?   Peak Flow --   ?   Pain Score 07/20/21 0829 9  ?   Pain Loc --   ?   Pain Edu? --   ?   Excl. in GC? --   ? ? ?Most recent vital signs: ?Vitals:  ? 07/20/21 1230 07/20/21 1433  ?BP: (!) 152/96 (!) 157/99  ?Pulse: (!) 114 (!) 128  ?Resp:  (!) 22  ?Temp:    ?SpO2: 97% 96%  ? ? ?Constitutional: Alert and oriented. ?Eyes: Conjunctivae are normal. ?Head: Atraumatic. ?Nose: No  congestion/rhinnorhea. ?Mouth/Throat: Mucous membranes are moist.  ?Cardiovascular: Normal rate, regular rhythm. Grossly normal heart sounds.  2+ radial pulses bilaterally. ?Respiratory: Normal respiratory effort.  No retractions. Lungs CTAB. ?Gastrointestinal: Soft and diffusely tender to palpation with no rebound or guarding, greatest in the left lower quadrant.  No CVA tenderness bilaterally. No distention. ?Genitourinary: Firm and tender area to patient's left testicle with right testicle nontender to palpation.  No erythema or edema noted, scrotum with no subcutaneous gas noted.  Tenderness to palpation noted left inguinal area. ?Musculoskeletal: No lower extremity tenderness nor edema.  ?Neurologic:  Normal speech and language. No gross focal neurologic deficits are appreciated. ? ? ? ?ED Results / Procedures / Treatments  ? ?Labs ?(all labs ordered are listed, but only abnormal results are displayed) ?Labs Reviewed  ?COMPREHENSIVE METABOLIC PANEL - Abnormal; Notable for the following components:  ?    Result Value  ? Glucose, Bld 137 (*)   ? Calcium 8.5 (*)   ? Total Bilirubin 1.4 (*)   ? All other components within normal limits  ?CBC - Abnormal; Notable for  the following components:  ? WBC 18.1 (*)   ? RBC 3.19 (*)   ? Hemoglobin 9.9 (*)   ? HCT 31.1 (*)   ? RDW 16.9 (*)   ? All other components within normal limits  ?URINALYSIS, ROUTINE W REFLEX MICROSCOPIC - Abnormal; Notable for the following components:  ? Color, Urine YELLOW (*)   ? APPearance CLOUDY (*)   ? Specific Gravity, Urine >1.046 (*)   ? Hgb urine dipstick MODERATE (*)   ? Nitrite POSITIVE (*)   ? Leukocytes,Ua LARGE (*)   ? WBC, UA >50 (*)   ? Bacteria, UA RARE (*)   ? All other components within normal limits  ?CULTURE, BLOOD (ROUTINE X 2)  ?CULTURE, BLOOD (ROUTINE X 2)  ?URINE CULTURE  ?LIPASE, BLOOD  ?LACTIC ACID, PLASMA  ?LACTIC ACID, PLASMA  ?CBC  ?CREATININE, SERUM  ?HEMOGLOBIN A1C  ?URINALYSIS, ROUTINE W REFLEX MICROSCOPIC   ? ? ?RADIOLOGY ?CT of abdomen/pelvis reviewed by me with no inflammatory changes, dilated bowel loops, or focal fluid collections. ? ?PROCEDURES: ? ?Critical Care performed: Yes, see critical care procedure note(s) ? ?.Critical Care ?Performed by: Chesley Noon, MD ?Authorized by: Chesley Noon, MD  ? ?Critical care provider statement:  ?  Critical care time (minutes):  30 ?  Critical care time was exclusive of:  Separately billable procedures and treating other patients and teaching time ?  Critical care was necessary to treat or prevent imminent or life-threatening deterioration of the following conditions:  Sepsis ?  Critical care was time spent personally by me on the following activities:  Development of treatment plan with patient or surrogate, discussions with consultants, evaluation of patient's response to treatment, examination of patient, ordering and review of laboratory studies, ordering and review of radiographic studies, ordering and performing treatments and interventions, pulse oximetry, re-evaluation of patient's condition and review of old charts ?  I assumed direction of critical care for this patient from another provider in my specialty: no   ?  Care discussed with: admitting provider   ? ? ?MEDICATIONS ORDERED IN ED: ?Medications  ?enoxaparin (LOVENOX) injection 40 mg (has no administration in time range)  ?ondansetron (ZOFRAN) tablet 4 mg (has no administration in time range)  ?  Or  ?ondansetron (ZOFRAN) injection 4 mg (has no administration in time range)  ?0.9 %  sodium chloride infusion (has no administration in time range)  ?HYDROcodone-acetaminophen (NORCO/VICODIN) 5-325 MG per tablet 1-2 tablet (has no administration in time range)  ?acetaminophen (TYLENOL) tablet 650 mg (has no administration in time range)  ?  Or  ?acetaminophen (TYLENOL) suppository 650 mg (has no administration in time range)  ?morphine (PF) 4 MG/ML injection 4 mg (4 mg Intravenous Given 07/20/21 1012)   ?ondansetron (ZOFRAN) injection 4 mg (4 mg Intravenous Given 07/20/21 1010)  ?lactated ringers bolus 1,000 mL (0 mLs Intravenous Stopped 07/20/21 1347)  ?iohexol (OMNIPAQUE) 300 MG/ML solution 100 mL (100 mLs Intravenous Contrast Given 07/20/21 1119)  ?levofloxacin (LEVAQUIN) IVPB 500 mg (500 mg Intravenous New Bag/Given 07/20/21 1423)  ? ? ? ?IMPRESSION / MDM / ASSESSMENT AND PLAN / ED COURSE  ?I reviewed the triage vital signs and the nursing notes. ?             ?               ? ?71 y.o. male past medical history of hypertension, paroxysmal atrial fibrillation, kidney stones, BPH, and alcoholic cirrhosis who presents to the ED pain and swelling  in his scrotum with diffuse in his abdomen towards both flanks associated with difficulty urinating. ? ?Differential diagnosis includes, but is not limited to, acute urinary retention, testicular torsion, scrotal cellulitis, epididymitis, inguinal hernia, BPH, UTI, pyelonephritis, kidney stones, diverticulitis. ? ?Patient well-appearing and in no acute distress, vital signs signs remarkable for tachycardia and hypertension.  He has tenderness and fullness in the left side of his scrotum but no overlying findings concerning for cellulitis or other infectious process.  We will further assess with ultrasound, also check CT scan for inguinal hernia for other potential etiologies cystitis.  No signs of acute urinary retention at this time given bladder scan with only 26 cc of urine.  Labs and UA are pending, we will treat symptomatically with IV morphine, Zofran, and IV fluids. ? ?CT of abdomen/pelvis is unremarkable, no evidence of inguinal hernia contributing to patient's symptoms.  Scrotal ultrasound does show findings concerning for epididymitis along with associated septated fluid collection which could represent pyocele or hematocele.  Patient's heart rate remains significantly elevated on reassessment and CBC significant for leukocytosis at 18.  Patient meets sepsis  criteria and would benefit from admission for IV antibiotics.  Case discussed with Dr. Annabell HowellsWrenn of urology, who evaluated the patient and agrees with plan but does not recommend any operative intervention.  Remainder of labs are reassuring with BMP

## 2021-07-20 NOTE — ED Notes (Signed)
Pt is alert and oriented, sitting in bed awaiting transport to the floor. ?

## 2021-07-20 NOTE — Progress Notes (Signed)
Patient arrived to room 202 from ED.  Assessment complete, VS obtained, and Admission database began.  ?

## 2021-07-20 NOTE — Consult Note (Signed)
?Subjective: ?1. Epididymitis   ?2. Sepsis without acute organ dysfunction, due to unspecified organism [facility])   ?  ? ?Consult requested by Dr. Chesley Noon ? ? ?I was asked to see Mr. Marvin Ortega in consultation for left testicular pain and swelling with exam and US findings consistent with epididymo-orchitis and a small reactive complex hydrocele.  He had the onset of the pain and swelling over the last 2 days and has had some issues with chronic small voids with a slow stream over the past year  but he is emptying well by bladder scan today.  The pain radiates into the LLQ.  He has a history of stones and prior right ureteroscopy with lithotripsy and cystoscopy with bladder stone lithotripsy  by Dr. Lonna Cobb in 2020 and a TURP for retention in 2021.   He last saw Dr. Lonna Cobb in 10/22 and was doing well without complaints.  He has been on finasteride.   He has had a prior positive culture with serratia in 4/21 but hasn't had a UA since then.  He has a left lower pole partial staghorn stone seen on CT today that is unchanged since his last KUB in 11/22.  ?ROS: ? ?Review of Systems  ?Gastrointestinal:  Positive for diarrhea.  ?Genitourinary:  Positive for urgency.  ?     Small voids  ?All other systems reviewed and are negative. ? ?No Known Allergies ? ?Past Medical History:  ?Diagnosis Date  ? Alcoholic cirrhosis of liver (HCC)   ? Anemia   ? Atrial fibrillation (HCC)   ? Atrial fibrillation (HCC)   ? BPH (benign prostatic hyperplasia)   ? COPD (chronic obstructive pulmonary disease) (HCC)   ? Dieulafoy lesion of stomach   ? GERD (gastroesophageal reflux disease)   ? h/o  ? GIB (gastrointestinal bleeding) 02/15/2018  ? Hemorrhagic shock (HCC)   ? History of kidney stones   ? Hypertension   ? Pre-diabetes   ? Sepsis (HCC)   ? Upper GI bleed   ? ? ?Past Surgical History:  ?Procedure Laterality Date  ? CYSTOSCOPY W/ RETROGRADES Right 11/23/2018  ? Procedure: CYSTOSCOPY WITH RETROGRADE PYELOGRAM;  Surgeon: Riki Altes, MD;  Location: [facility] ORS;  Service: Urology;  Laterality: Right;  ? CYSTOSCOPY WITH HOLMIUM LASER LITHOTRIPSY  11/30/2018  ? Procedure: CYSTOSCOPY WITH HOLMIUM LASER LITHOTRIPSY;  Surgeon: Riki Altes, MD;  Location: [facility] ORS;  Service: Urology;;  ? CYSTOSCOPY WITH LITHOLAPAXY N/A 11/23/2018  ? Procedure: CYSTOSCOPY WITH LITHOLAPAXY;  Surgeon: Riki Altes, MD;  Location: [facility] ORS;  Service: Urology;  Laterality: N/A;  ? CYSTOSCOPY WITH LITHOLAPAXY N/A 11/30/2018  ? Procedure: CYSTOSCOPY WITH LITHOLAPAXY;  Surgeon: Riki Altes, MD;  Location: [facility] ORS;  Service: Urology;  Laterality: N/A;  ? CYSTOSCOPY/URETEROSCOPY/HOLMIUM LASER/STENT PLACEMENT Right 11/23/2018  ? Procedure: CYSTOSCOPY/URETEROSCOPY/HOLMIUM LASER/STENT PLACEMENT;  Surgeon: Riki Altes, MD;  Location: [facility] ORS;  Service: Urology;  Laterality: Right;  ? ESOPHAGOGASTRODUODENOSCOPY N/A 02/15/2018  ? Procedure: ESOPHAGOGASTRODUODENOSCOPY (EGD);  Surgeon: Toney Reil, MD;  Location: [facility] ENDOSCOPY;  Service: Gastroenterology;  Laterality: N/A;  ? TRANSURETHRAL RESECTION OF PROSTATE N/A 05/24/2019  ? Procedure: TRANSURETHRAL RESECTION OF THE PROSTATE (TURP);  Surgeon: Riki Altes, MD;  Location: [facility] ORS;  Service: Urology;  Laterality: N/A;  ? ? ?Social History  ? ?Socioeconomic History  ? Marital status: Divorced  ?  Spouse name: Not on file  ? Number of children: Not on file  ? Years of education: Not on file  ?  Highest education level: Not on file  ?Occupational History  ? Not on file  ?Tobacco Use  ? Smoking status: Never  ? Smokeless tobacco: Never  ?Vaping Use  ? Vaping Use: Never used  ?Substance and Sexual Activity  ? Alcohol use: Not Currently  ? Drug use: Never  ? Sexual activity: Yes  ?  Birth control/protection: None  ?Other Topics Concern  ? Not on file  ?Social History Narrative  ? Not on file  ? ?Social Determinants of Health  ? ?Financial Resource Strain: Not on file  ?Food Insecurity: Not on file   ?Transportation Needs: Not on file  ?Physical Activity: Not on file  ?Stress: Not on file  ?Social Connections: Not on file  ?Intimate Partner Violence: Not on file  ? ? ?Family History  ?Problem Relation Age of Onset  ? Heart disease Father   ? Aneurysm Sister   ? ? ?Anti-infectives: ?Anti-infectives (From admission, onward)  ? ? Start     Dose/Rate Route Frequency Ordered Stop  ? 07/20/21 1345  levofloxacin (LEVAQUIN) IVPB 500 mg       ? 500 mg ?100 mL/hr over 60 Minutes Intravenous  Once 07/20/21 1340    ? ?  ? ? ?Current Facility-Administered Medications  ?Medication Dose Route Frequency Provider Last Rate Last Admin  ? levofloxacin (LEVAQUIN) IVPB 500 mg  500 mg Intravenous Once Chesley Noon, MD      ? ?Current Outpatient Medications  ?Medication Sig Dispense Refill  ? albuterol (VENTOLIN HFA) 108 (90 Base) MCG/ACT inhaler Inhale 2 puffs into the lungs every 6 (six) hours as needed for wheezing or shortness of breath. 8 g 2  ? aspirin EC 81 MG tablet Take 81 mg by mouth daily.    ? atorvastatin (LIPITOR) 20 MG tablet Take 1 tablet by mouth daily.    ? Budeson-Glycopyrrol-Formoterol (BREZTRI AEROSPHERE) 160-9-4.8 MCG/ACT AERO Inhale 2 puffs into the lungs in the morning and at bedtime. 10.7 g 6  ? Budeson-Glycopyrrol-Formoterol (BREZTRI AEROSPHERE) 160-9-4.8 MCG/ACT AERO Inhale 160 mcg into the lungs in the morning and at bedtime. 5.9 g 0  ? finasteride (PROSCAR) 5 MG tablet Take 5 mg by mouth every morning.     ? loratadine (CLARITIN) 10 MG tablet Take 10 mg by mouth daily.    ? metoprolol succinate (TOPROL-XL) 50 MG 24 hr tablet Take 1 tablet by mouth daily.    ? pantoprazole (PROTONIX) 40 MG tablet Take 1 tablet (40 mg total) by mouth 2 (two) times daily. 60 tablet 0  ? spironolactone (ALDACTONE) 25 MG tablet 1 tablet daily.    ? Thiamine HCl (VITAMIN B-1) 250 MG tablet Take 250 mg by mouth daily.    ? ? ? ?Objective: ?Vital signs in last 24 hours: ?BP (!) 150/94   Pulse (!) 106   Temp 98.2 ?F (36.8 ?C)    Resp 17   Ht 5\' 9"  (1.753 m)   Wt 73 kg   SpO2 96%   BMI 23.77 kg/m?  ? ?Intake/Output from previous day: ?No intake/output data recorded. ?Intake/Output this shift: ?No intake/output data recorded. ? ? ?Physical Exam ?Vitals reviewed.  ?Constitutional:   ?   Appearance: He is well-developed. He is obese.  ?Cardiovascular:  ?   Rate and Rhythm: Regular rhythm. Tachycardia present.  ?Pulmonary:  ?   Effort: Pulmonary effort is normal. No respiratory distress.  ?   Breath sounds: Normal breath sounds.  ?Abdominal:  ?   General: Abdomen is flat.  ?  Palpations: Abdomen is soft.  ?   Tenderness: There is no abdominal tenderness.  ?Genitourinary: ?   Comments: Normal phallus with adequate meatus. ?Scrotum with mild left erythema with induration and tenderness consistent with epididymo-orchitis.   Right testicle and epididymis normal.   ?Skin: ?   General: Skin is warm and dry.  ?Neurological:  ?   General: No focal deficit present.  ?   Mental Status: He is alert and oriented to person, place, and time.  ?Psychiatric:     ?   Mood and Affect: Mood normal.     ?   Behavior: Behavior normal.  ? ? ?Lab Results:  ?Results for orders placed or performed during the hospital encounter of 07/20/21 (from the past 24 hour(s))  ?Lipase, blood     Status: None  ? Collection Time: 07/20/21 10:10 AM  ?Result Value Ref Range  ? Lipase 33 11 - 51 U/L  ?Comprehensive metabolic panel     Status: Abnormal  ? Collection Time: 07/20/21 10:10 AM  ?Result Value Ref Range  ? Sodium 136 135 - 145 mmol/L  ? Potassium 3.6 3.5 - 5.1 mmol/L  ? Chloride 106 98 - 111 mmol/L  ? CO2 22 22 - 32 mmol/L  ? Glucose, Bld 137 (H) 70 - 99 mg/dL  ? BUN 9 8 - 23 mg/dL  ? Creatinine, Ser 1.07 0.61 - 1.24 mg/dL  ? Calcium 8.5 (L) 8.9 - 10.3 mg/dL  ? Total Protein 7.6 6.5 - 8.1 g/dL  ? Albumin 3.5 3.5 - 5.0 g/dL  ? AST 40 15 - 41 U/L  ? ALT 27 0 - 44 U/L  ? Alkaline Phosphatase 118 38 - 126 U/L  ? Total Bilirubin 1.4 (H) 0.3 - 1.2 mg/dL  ? GFR, Estimated  >60 >60 mL/min  ? Anion gap 8 5 - 15  ?CBC     Status: Abnormal  ? Collection Time: 07/20/21 10:10 AM  ?Result Value Ref Range  ? WBC 18.1 (H) 4.0 - 10.5 K/uL  ? RBC 3.19 (L) 4.22 - 5.81 MIL/uL  ? Hemoglobin 9.9 (L) 13.0 - 1

## 2021-07-21 ENCOUNTER — Encounter: Payer: Self-pay | Admitting: Internal Medicine

## 2021-07-21 DIAGNOSIS — N401 Enlarged prostate with lower urinary tract symptoms: Secondary | ICD-10-CM

## 2021-07-21 DIAGNOSIS — K219 Gastro-esophageal reflux disease without esophagitis: Secondary | ICD-10-CM | POA: Diagnosis not present

## 2021-07-21 DIAGNOSIS — F1010 Alcohol abuse, uncomplicated: Secondary | ICD-10-CM | POA: Diagnosis not present

## 2021-07-21 DIAGNOSIS — N451 Epididymitis: Secondary | ICD-10-CM | POA: Diagnosis not present

## 2021-07-21 LAB — COMPREHENSIVE METABOLIC PANEL
ALT: 19 U/L (ref 0–44)
AST: 30 U/L (ref 15–41)
Albumin: 2.7 g/dL — ABNORMAL LOW (ref 3.5–5.0)
Alkaline Phosphatase: 114 U/L (ref 38–126)
Anion gap: 12 (ref 5–15)
BUN: 10 mg/dL (ref 8–23)
CO2: 20 mmol/L — ABNORMAL LOW (ref 22–32)
Calcium: 7.9 mg/dL — ABNORMAL LOW (ref 8.9–10.3)
Chloride: 105 mmol/L (ref 98–111)
Creatinine, Ser: 1.08 mg/dL (ref 0.61–1.24)
GFR, Estimated: >60 mL/min (ref >60)
Glucose, Bld: 100 mg/dL — ABNORMAL HIGH (ref 70–99)
Potassium: 3.2 mmol/L — ABNORMAL LOW (ref 3.5–5.1)
Sodium: 137 mmol/L (ref 135–145)
Total Bilirubin: 1.6 mg/dL — ABNORMAL HIGH (ref 0.3–1.2)
Total Protein: 6.2 g/dL — ABNORMAL LOW (ref 6.5–8.1)

## 2021-07-21 LAB — CBC
HCT: 26.1 % — ABNORMAL LOW (ref 39.0–52.0)
Hemoglobin: 8.4 g/dL — ABNORMAL LOW (ref 13.0–17.0)
MCH: 31.3 pg (ref 26.0–34.0)
MCHC: 32.2 g/dL (ref 30.0–36.0)
MCV: 97.4 fL (ref 80.0–100.0)
Platelets: 166 10*3/uL (ref 150–400)
RBC: 2.68 MIL/uL — ABNORMAL LOW (ref 4.22–5.81)
RDW: 16.9 % — ABNORMAL HIGH (ref 11.5–15.5)
WBC: 17.8 10*3/uL — ABNORMAL HIGH (ref 4.0–10.5)
nRBC: 0 % (ref 0.0–0.2)

## 2021-07-21 LAB — PROCALCITONIN: Procalcitonin: 0.17 ng/mL

## 2021-07-21 MED ORDER — POTASSIUM CHLORIDE CRYS ER 20 MEQ PO TBCR
40.0000 meq | EXTENDED_RELEASE_TABLET | Freq: Once | ORAL | Status: AC
Start: 2021-07-21 — End: 2021-07-21
  Administered 2021-07-21: 40 meq via ORAL
  Filled 2021-07-21: qty 2

## 2021-07-21 NOTE — Assessment & Plan Note (Signed)
History of TURP.  Outpatient urology follow-up with Dr. Stoioff ?

## 2021-07-21 NOTE — Assessment & Plan Note (Signed)
Chronic and stable.  Outpatient follow-up with Dr. Lonna Cobb for left partial staghorn calculus ?

## 2021-07-21 NOTE — Assessment & Plan Note (Signed)
Left-sided epididymitis with early sepsis.  Continue Levaquin pending culture.  Outpatient follow-up with Dr. Lonna Cobb at discharge. ?

## 2021-07-21 NOTE — Assessment & Plan Note (Signed)
Counseled.  May benefit from outpatient AA ?

## 2021-07-21 NOTE — Assessment & Plan Note (Signed)
Continue Protonix °

## 2021-07-21 NOTE — Plan of Care (Signed)
  Problem: Clinical Measurements: Goal: Cardiovascular complication will be avoided Outcome: Progressing   Problem: Nutrition: Goal: Adequate nutrition will be maintained Outcome: Progressing   Problem: Pain Managment: Goal: General experience of comfort will improve Outcome: Progressing   

## 2021-07-21 NOTE — Assessment & Plan Note (Signed)
Hemoglobin 8.4 (9.9).  Monitor and transfuse for hemoglobin less than 7 ?

## 2021-07-21 NOTE — Assessment & Plan Note (Signed)
Continue metoprolol for rate control.  Not on anticoagulation due to history of alcohol abuse and high risk for bleeding.  Patient has had history of GI bleed with hemorrhagic shock ?

## 2021-07-21 NOTE — Hospital Course (Addendum)
71 year old male history of alcoholic liver disease, anemia ,  P atrial fib not on AC due to presumed ETOH use, BPH, COPD , GERD, hx of gi bleed, HTN , prediabetes admitted for left epididymitis with early sepsis .  Patient has an history of nephrolithiasis with ureteroscopic stone removal on 11/2018, bladder calculus with cystoscopy litholap packs see on 11/28/2018, TURP on 05/2019 on finasteride presents with pain left side of the back and scrotum for the past few days.   5/14-continue Levaquin for now.  Await culture 5/15: Urine culture growing Serratia 5/16: Blood culture growing Serratia, ID consult  5/17: Both blood and urine cultures growing Serratia, pending susceptibility.  ID is recommending 4 weeks of antibiotics, most likely p.o. will make final recommendations today.  5/18: Patient remained afebrile and improving.  Wants to go home.  Repeat blood cultures remain negative.  ID is recommending total of 4 weeks of Levaquin.  Patient received about 1 week dose as IV in the hospital and discharged on 3 more weeks of Levaquin.  He will follow-up with his providers for further recommendations.  Patient also need to see his urologist for further management.  He will continue with current medications.

## 2021-07-21 NOTE — Progress Notes (Signed)
?  Progress Note ? ? ?Patient: Marvin Ortega [id number] DOB: 28-Mar-1950 DOA: 07/20/2021     1 ?DOS: the patient was seen and examined on 07/21/2021 ?  ?Brief hospital course: ?71 year old male history of alcoholic liver disease, anemia ,  P atrial fib not on AC due to presumed ETOH use, BPH, COPD , GERD, hx of gi bleed, HTN , prediabetes admitted for left epididymitis with early sepsis  ? ?5/14-continue Levaquin for now.  Await culture ? ? ?Assessment and Plan: ?* Epididymitis ?Left-sided epididymitis with early sepsis.  Continue Levaquin pending culture.  Outpatient follow-up with Dr. Bernardo Heater at discharge. ? ?Normocytic anemia ?Hemoglobin 8.4 (9.9).  Monitor and transfuse for hemoglobin less than 7 ? 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  Outpatient follow-up with Dr. Bernardo Heater for left partial staghorn calculus ? ?PAF (paroxysmal atrial fibrillation) ([place]) ?Continue metoprolol for rate control.  Not on anticoagulation due to history of alcohol abuse and high risk for bleeding.  Patient has had history of GI bleed with hemorrhagic shock ? ?GERD (gastroesophageal reflux disease) ?Continue Protonix ? ?AA (alcohol abuse) ?Counseled.  May benefit from outpatient AA ? ?Benign prostatic hyperplasia with lower urinary tract symptoms ?History of TURP.  Outpatient urology follow-up with Dr. Bernardo Heater ? ? ? ? ?  ? ?Subjective: Seems pleasantly confused scrotal pain has improved ? ?Physical Exam: ?Vitals:  ? 07/21/21 0013 07/21/21 0359 07/21/21 0747 07/21/21 0750  ?BP: 127/87 140/84 (!) 147/88   ?Pulse: (!) 111 (!) 106 93   ?Resp: 18 18 18    ?Temp: 99.5 ?F (37.5 ?C) 98.9 ?F (37.2 ?C) 97.9 ?F (36.6 ?C)   ?TempSrc: Oral     ?SpO2: 98% 100% 98%   ?Weight:    72.6 kg  ?Height:      ? ?Constitutional: NAD, calm, comfortable ?Eyes: PERRL, lids and conjunctivae normal ?ENMT: Mucous membranes are moist. Posterior pharynx clear of any exudate or lesions.poor dentition.  ?Neck: normal, supple, no masses, no thyromegaly ?Respiratory:  clear to auscultation bilaterally, no wheezing, no crackles. Normal respiratory effort. No accessory muscle use.  ?Cardiovascular: Regular rate and rhythm, no murmurs / rubs / gallops. No extremity edema. 2+ pedal pulses.  ?Abdomen: no tenderness, no masses palpated. No hepatosplenomegaly. Bowel sounds positive.  ?Musculoskeletal: no clubbing / cyanosis. No joint deformity upper and lower extremities. Good ROM, no contractures. Normal muscle tone.  ?Skin: no rashes, lesions, ulcers. No induration ?Neurologic: CN 2-12 grossly intact. Sensation intact, Strength 5/5 in all 4.  ?Psychiatric: Normal judgment and insight. Alert and oriented x 3. Normal mood.  ?GU: Mild swollen scrotum , minimal tenderness to touch mild erythema. ? ?Data Reviewed: ? ?Potassium 3.2, hemoglobin 8.4, WBC 17.8 ? ?Family Communication: None ? ?Disposition: ?Status is: Inpatient ?Remains inpatient appropriate because: Needs ongoing treatment of epididymitis and evaluate confusion ? ? Planned Discharge Destination: Home with Home Health ? ? ? ?Time spent: 35 minutes ? ?Author: ?Max Sane, MD ?07/21/2021 10:40 AM ? ?For on call review [email or web address].  ?

## 2021-07-22 DIAGNOSIS — F1010 Alcohol abuse, uncomplicated: Secondary | ICD-10-CM | POA: Diagnosis not present

## 2021-07-22 DIAGNOSIS — K219 Gastro-esophageal reflux disease without esophagitis: Secondary | ICD-10-CM | POA: Diagnosis not present

## 2021-07-22 DIAGNOSIS — I480 Paroxysmal atrial fibrillation: Secondary | ICD-10-CM

## 2021-07-22 DIAGNOSIS — N451 Epididymitis: Secondary | ICD-10-CM | POA: Diagnosis not present

## 2021-07-22 LAB — BLOOD CULTURE ID PANEL (REFLEXED) - BCID2

## 2021-07-22 LAB — MAGNESIUM: Magnesium: 1.2 mg/dL — ABNORMAL LOW (ref 1.7–2.4)

## 2021-07-22 LAB — CBC
HCT: 26.8 % — ABNORMAL LOW (ref 39.0–52.0)
Hemoglobin: 8.7 g/dL — ABNORMAL LOW (ref 13.0–17.0)
MCH: 31.4 pg (ref 26.0–34.0)
MCHC: 32.5 g/dL (ref 30.0–36.0)
MCV: 96.8 fL (ref 80.0–100.0)
Platelets: 174 10*3/uL (ref 150–400)
RBC: 2.77 MIL/uL — ABNORMAL LOW (ref 4.22–5.81)
RDW: 17.2 % — ABNORMAL HIGH (ref 11.5–15.5)
WBC: 14 10*3/uL — ABNORMAL HIGH (ref 4.0–10.5)
nRBC: 0 % (ref 0.0–0.2)

## 2021-07-22 LAB — BASIC METABOLIC PANEL
Anion gap: 12 (ref 5–15)
BUN: 11 mg/dL (ref 8–23)
CO2: 18 mmol/L — ABNORMAL LOW (ref 22–32)
Calcium: 8 mg/dL — ABNORMAL LOW (ref 8.9–10.3)
Chloride: 104 mmol/L (ref 98–111)
Creatinine, Ser: 1.01 mg/dL (ref 0.61–1.24)
GFR, Estimated: >60 mL/min (ref >60)
Glucose, Bld: 117 mg/dL — ABNORMAL HIGH (ref 70–99)
Potassium: 3.1 mmol/L — ABNORMAL LOW (ref 3.5–5.1)
Sodium: 134 mmol/L — ABNORMAL LOW (ref 135–145)

## 2021-07-22 LAB — PROCALCITONIN: Procalcitonin: 0.33 ng/mL

## 2021-07-22 MED ORDER — METOPROLOL SUCCINATE ER 100 MG PO TB24
100.0000 mg | ORAL_TABLET | Freq: Every day | ORAL | Status: DC
Start: 2021-07-23 — End: 2021-07-25
  Administered 2021-07-23 – 2021-07-25 (×3): 100 mg via ORAL
  Filled 2021-07-22 (×3): qty 1

## 2021-07-22 MED ORDER — POTASSIUM CHLORIDE CRYS ER 20 MEQ PO TBCR
40.0000 meq | EXTENDED_RELEASE_TABLET | Freq: Once | ORAL | Status: AC
Start: 2021-07-22 — End: 2021-07-22
  Administered 2021-07-22: 40 meq via ORAL
  Filled 2021-07-22: qty 2

## 2021-07-22 MED ORDER — LEVOFLOXACIN IN D5W 750 MG/150ML IV SOLN
750.0000 mg | INTRAVENOUS | Status: DC
  Administered 2021-07-23 – 2021-07-24 (×2): 750 mg via INTRAVENOUS
  Filled 2021-07-22 (×2): qty 150

## 2021-07-22 NOTE — Assessment & Plan Note (Signed)
Continue metoprolol for rate control.  Not on anticoagulation due to history of alcohol abuse and high risk for bleeding.  Patient has had history of GI bleed with hemorrhagic shock ?

## 2021-07-22 NOTE — Progress Notes (Signed)
OT Cancellation Note ? ?Patient Details ?Name: Marvin Ortega ?MRN: [id number] ?DOB: February 11, 1951 ? ? ?Cancelled Treatment:    Reason Eval/Treat Not Completed: OT screened, no needs identified, will sign off ? ?Thank you for OT consult.  Per mobility specialist and PT, pt able to walk 280 ft with standby assist and is at baseline level of functioning.  MD aware of therapy plan to sign off.  Will discharge pt from OT caseload, please re-consult if pt has change in functional status or additional OT-related needs arise. ? ?Dennison Nancy, OTR/L ?07/22/21, 2:58 PM ? ?

## 2021-07-22 NOTE — Progress Notes (Signed)
Mobility Specialist - Progress Note ? ? 07/22/21 1031  ?Mobility  ?Activity Ambulated with assistance in hallway  ?Level of Assistance Standby assist, set-up cues, supervision of patient - no hands on  ?Assistive Device None  ?Distance Ambulated (ft) 280 ft  ?Activity Response Tolerated well  ?$Mobility charge 1 Mobility  ? ? ? ?Pre-mobility: 102 HR, 98% SpO2 ?During mobility: 121 HR, 95% SpO2 ?Post-mobility: 101 HR, 99% SpO2 ? ? ?Pt lying in bed upon arrival, utilizing 2L. Pt ambulated in hallway on RA. Pt reported having LLE pain earlier this date, but none reported during activity. O2 maintained mid-high 90s, but some audible wheezing noted. SOB. No dizziness. Max HR 123 bpm. Pt returned to bed with needs in reach, back on 2L per pt request. RN notified. ? ? ?Kathee Delton ?Mobility Specialist ?07/22/21, 10:36 AM ? ?

## 2021-07-22 NOTE — Progress Notes (Signed)
PHARMACY - PHYSICIAN COMMUNICATION ?CRITICAL VALUE ALERT - BLOOD CULTURE IDENTIFICATION (BCID) ? ?ASAAD WOHLER is an 71 y.o. male who presented to [facility] on 07/20/2021 with a chief complaint of epidymidis ? ?Assessment: BCID resulted with 1 of 4 vials (aero only) with GNR > Serratia Marcescens (no resistances detected). Previous urine cultures with serratia as well. Currently on Levofloxacin 500mg  IV q24h. ? ?Name of physician (or Provider) Contacted: Dr. Florina Ou ? ?Current antibiotics: Levofloxacin 500mg  IV q24h ? ?Changes to prescribed antibiotics recommended:  ?Recommendations accepted by provider to increase to 500>750mg  IV q24h ? ?No results found for this or any previous visit. ? ?Shanon Brow Talena Neira ?07/22/2021  10:09 PM ? ?

## 2021-07-22 NOTE — Assessment & Plan Note (Signed)
Counseled.  May benefit from outpatient AA ?

## 2021-07-22 NOTE — Assessment & Plan Note (Signed)
Hemoglobin 8.7 (9.9).  Monitor and transfuse for hemoglobin less than 7 ?

## 2021-07-22 NOTE — Assessment & Plan Note (Signed)
Left-sided epididymitis with early sepsis.  Continue Levaquin pending culture.  Outpatient follow-up with Dr. Bernardo Heater at discharge.  Urine culture growing Serratia ?

## 2021-07-22 NOTE — Progress Notes (Signed)
Patient alert and oriented. Ambulated to bathroom with staff. No distress noted. Patient wanted to sit on edge of bed. Vital signs checked while sitting. Patient heart rate and respirations slightly elevated due to walking. Patient settled back into bed. Will recheck his vital signs. Patient remains without complaints or distress at this time. ?

## 2021-07-22 NOTE — Assessment & Plan Note (Signed)
History of TURP.  Outpatient urology follow-up with Dr. Stoioff ?

## 2021-07-22 NOTE — Assessment & Plan Note (Signed)
Chronic and stable.  Outpatient follow-up with Dr. Stoioff for left partial staghorn calculus ?

## 2021-07-22 NOTE — Progress Notes (Signed)
PT Cancellation Note ? ?Patient Details ?Name: Marvin Ortega ?MRN: [id number] ?DOB: 07/27/1950 ? ? ?Cancelled Treatment:    Reason Eval/Treat Not Completed: PT screened, no needs identified, will sign off.  ?Per mobility specialist pt able to walk 280 ft with standby assist and is at baseline level of functioning.  MD aware of therapy plan to sign off.  Will discharge pt from PT caseload, please re-consult if pt has change in functional status or additional PT-related needs arise. ?  ?Lieutenant Diego PT, DPT ?3:29 PM,07/22/21 ? ?

## 2021-07-22 NOTE — TOC Initial Note (Signed)
Transition of Care (TOC) - Initial/Assessment Note  ? ? ?Patient Details  ?Name: Marvin Ortega ?MRN: [id number] ?Date of Birth: Jul 08, 1950 ? ?Transition of Care (TOC) CM/SW Contact:    ?Chapman Fitch, RN ?Phone Number: ?07/22/2021, 2:33 PM ? ?Clinical Narrative:                 ?Admitted for: Epididymitis  ?Admitted from: Home alone ?PCP: [facility] ?Current home health/prior home health/DME: [facility] ? ?PT ambulates 280 feet with mobility tech no DME ?Patient declines substance abuse resources  ? ? ?Expected Discharge Plan: Home/Self Care ?  ? ? ?Patient Goals and CMS Choice ?  ?  ?  ? ?Expected Discharge Plan and Services ?Expected Discharge Plan: Home/Self Care ?  ?  ?  ?Living arrangements for the past 2 months: Apartment ?                ?  ?  ?  ?  ?  ?  ?  ?  ?  ?  ? ?Prior Living Arrangements/Services ?Living arrangements for the past 2 months: Apartment ?Lives with:: Self ?Patient language and need for interpreter reviewed:: Yes ?Do you feel safe going back to the place where you live?: Yes      ?  ?  ?Current home services: DME ?Criminal Activity/Legal Involvement Pertinent to Current Situation/Hospitalization: No - Comment as needed ? ?Activities of Daily Living ?Home Assistive Devices/Equipment: None ?ADL Screening (condition at time of admission) ?Patient's cognitive ability adequate to safely complete daily activities?: Yes ?Is the patient deaf or have difficulty hearing?: No ?Does the patient have difficulty seeing, even when wearing glasses/contacts?: No ?Does the patient have difficulty concentrating, remembering, or making decisions?: No ?Patient able to express need for assistance with ADLs?: Yes ?Does the patient have difficulty dressing or bathing?: No ?Independently performs ADLs?: Yes (appropriate for developmental age) ?Does the patient have difficulty walking or climbing stairs?: No ?Weakness of Legs: Both ?Weakness of Arms/Hands: None ? ?Permission Sought/Granted ?  ?  ?   ?   ?   ?    ? ?Emotional Assessment ?  ?  ?  ?Orientation: : Oriented to Self, Oriented to  Time, Oriented to Situation, Oriented to Place ?Alcohol / Substance Use: Alcohol Use ?Psych Involvement: No (comment) ? ?Admission diagnosis:  Epididymitis [N45.1] ?Sepsis without acute organ dysfunction, due to unspecified organism [facility]) [A41.9] ?Patient Active Problem List  ? Diagnosis Date Noted  ? Epididymitis 07/20/2021  ? S/P TURP 05/24/2019  ? Acute renal failure (ARF) (HCC) 03/28/2019  ? COPD (chronic obstructive pulmonary disease) (HCC) 03/28/2019  ? Acidosis 03/28/2019  ? Normocytic anemia 03/28/2019  ? Bladder calculus 11/30/2018  ? C. difficile colitis 11/25/2018  ? Renal calculus 11/23/2018  ? Ureteral calculi 10/29/2018  ? Calculus of bladder 10/29/2018  ? Nephrolithiasis 10/29/2018  ? Gross hematuria 09/28/2018  ? PAF (paroxysmal atrial fibrillation) (HCC) 02/25/2018  ? Benign prostatic hyperplasia with lower urinary tract symptoms 12/08/2013  ? Essential (primary) hypertension 12/08/2013  ? AA (alcohol abuse) 12/08/2013  ? GERD (gastroesophageal reflux disease) 02/13/2011  ? Hyperlipidemia 06/20/2010  ? ?PCP:  Sherrin Daisy Danton Clap, MD ?Pharmacy:   ?[facility] [address] [place] - [address] ?[address] ?[place] [address] ?Phone: [phone] Fax: [phone] ? ? ? ? ?Social Determinants of Health (SDOH) Interventions ?  ? ?Readmission Risk Interventions ? ?  04/01/2019  ?  4:02 PM  ?Readmission Risk Prevention Plan  ?Transportation Screening Complete  ?PCP or  Specialist Appt within 3-5 Days Complete  ?HRI or Home Care Consult Patient refused  ?Palliative Care Screening Not Applicable  ?Medication Review Oceanographer) Complete  ? ? ? ?

## 2021-07-22 NOTE — Assessment & Plan Note (Signed)
Continue Protonix °

## 2021-07-22 NOTE — Progress Notes (Signed)
?  Progress Note ? ? ?Patient: Marvin Ortega JEH:[id number] DOB: 12-02-1950 DOA: 07/20/2021     2 ?DOS: the patient was seen and examined on 07/22/2021 ?  ?Brief hospital course: ?71 year old male history of alcoholic liver disease, anemia ,  P atrial fib not on AC due to presumed ETOH use, BPH, COPD , GERD, hx of gi bleed, HTN , prediabetes admitted for left epididymitis with early sepsis  ? ?5/14-continue Levaquin for now.  Await culture ?5/15: Urine culture growing Serratia ? ? ?Assessment and Plan: ?* Epididymitis ?Left-sided epididymitis with early sepsis.  Continue Levaquin pending culture.  Outpatient follow-up with Dr. Lonna Cobb at discharge.  Urine culture growing Serratia ? ?Normocytic anemia ?Hemoglobin 8.7 (9.9).  Monitor and transfuse for hemoglobin less than 7 ? 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  Outpatient follow-up with Dr. Lonna Cobb for left partial staghorn calculus ? ?PAF (paroxysmal atrial fibrillation) (HCC) ?Continue metoprolol for rate control.  Not on anticoagulation due to history of alcohol abuse and high risk for bleeding.  Patient has had history of GI bleed with hemorrhagic shock ? ?GERD (gastroesophageal reflux disease) ?Continue Protonix ? ?AA (alcohol abuse) ?Counseled.  May benefit from outpatient AA ? ?Benign prostatic hyperplasia with lower urinary tract symptoms ?History of TURP.  Outpatient urology follow-up with Dr. Lonna Cobb ? ? ? ? ?  ? ?Subjective: Minimal pain at home the scrotum area ? ?Physical Exam: ?Vitals:  ? 07/22/21 0000 07/22/21 0348 07/22/21 0519 07/22/21 0520  ?BP: (!) 142/85 (!) 139/96 (!) 144/88 (!) 148/88  ?Pulse: (!) 102 (!) 114  (!) 109  ?Resp:    17  ?Temp:  98.7 ?F (37.1 ?C) 98.7 ?F (37.1 ?C)   ?TempSrc:  Oral Oral   ?SpO2:  99%  99%  ?Weight:      ?Height:      ? ?Constitutional:?NAD, calm, comfortable ?Eyes:?PERRL, lids and conjunctivae normal ?Neck:?normal, supple, no masses, no thyromegaly ?Respiratory:?clear to auscultation bilaterally, no wheezing, no  crackles. Normal respiratory effort. No accessory muscle use.  ?Cardiovascular:?Regular rate and rhythm, no murmurs / rubs / gallops. No extremity edema.  ?Abdomen:?no tenderness, no masses palpated. No hepatosplenomegaly. Bowel sounds positive.  ?Musculoskeletal:?no clubbing / cyanosis. Good ROM, no contractures. Normal muscle tone.  ?Skin:?no rashes, lesions, ulcers. No induration ?Neurologic:?CN 2-12 grossly intact. Sensation intact, Strength 5/5 in all 4.  ?Psychiatric:?Normal judgment and insight. Alert and oriented x 3. Normal mood.  ?GU: Mild swollen scrotum , minimal tenderness?to touch mild erythema. ? ?Data Reviewed: ? ?K 3.1 ? ?Family Communication: None ? ?Disposition: ?Status is: Inpatient ?Remains inpatient appropriate because: Management of epididymitis.  Patient is still having some pain and tachycardia ? ? Planned Discharge Destination: Home ? ? ? DVT prophylaxis-Lovenox ?Time spent: 35 minutes ? ?Author: ?Delfino Lovett, MD ?07/22/2021 3:11 PM ? ?For on call review [email or web address]  ?

## 2021-07-23 ENCOUNTER — Inpatient Hospital Stay: Payer: Medicare HMO

## 2021-07-23 DIAGNOSIS — A4153 Sepsis due to Serratia: Secondary | ICD-10-CM | POA: Diagnosis not present

## 2021-07-23 DIAGNOSIS — A498 Other bacterial infections of unspecified site: Secondary | ICD-10-CM | POA: Diagnosis present

## 2021-07-23 DIAGNOSIS — A419 Sepsis, unspecified organism: Secondary | ICD-10-CM

## 2021-07-23 DIAGNOSIS — K219 Gastro-esophageal reflux disease without esophagitis: Secondary | ICD-10-CM | POA: Diagnosis not present

## 2021-07-23 DIAGNOSIS — N451 Epididymitis: Secondary | ICD-10-CM | POA: Diagnosis not present

## 2021-07-23 DIAGNOSIS — F1010 Alcohol abuse, uncomplicated: Secondary | ICD-10-CM | POA: Diagnosis not present

## 2021-07-23 LAB — BASIC METABOLIC PANEL
Anion gap: 10 (ref 5–15)
BUN: 9 mg/dL (ref 8–23)
CO2: 21 mmol/L — ABNORMAL LOW (ref 22–32)
Calcium: 7.9 mg/dL — ABNORMAL LOW (ref 8.9–10.3)
Chloride: 103 mmol/L (ref 98–111)
Creatinine, Ser: 0.92 mg/dL (ref 0.61–1.24)
GFR, Estimated: >60 mL/min (ref >60)
Glucose, Bld: 122 mg/dL — ABNORMAL HIGH (ref 70–99)
Potassium: 3.7 mmol/L (ref 3.5–5.1)
Sodium: 134 mmol/L — ABNORMAL LOW (ref 135–145)

## 2021-07-23 LAB — CBC
HCT: 25.9 % — ABNORMAL LOW (ref 39.0–52.0)
Hemoglobin: 8.3 g/dL — ABNORMAL LOW (ref 13.0–17.0)
MCH: 31.2 pg (ref 26.0–34.0)
MCHC: 32 g/dL (ref 30.0–36.0)
MCV: 97.4 fL (ref 80.0–100.0)
Platelets: 174 10*3/uL (ref 150–400)
RBC: 2.66 MIL/uL — ABNORMAL LOW (ref 4.22–5.81)
RDW: 16.7 % — ABNORMAL HIGH (ref 11.5–15.5)
WBC: 10.5 10*3/uL (ref 4.0–10.5)
nRBC: 0 % (ref 0.0–0.2)

## 2021-07-23 MED ORDER — MAGNESIUM SULFATE 4 GM/100ML IV SOLN
4.0000 g | Freq: Once | INTRAVENOUS | Status: AC
  Administered 2021-07-23: 4 g via INTRAVENOUS
  Filled 2021-07-23: qty 100

## 2021-07-23 NOTE — Assessment & Plan Note (Addendum)
Left-sided epididymitis with sepsis.  Continue Levaquin for now.  Outpatient follow-up with Dr. Lonna Cobb at discharge.  Urine and blood culture growing Serratia ? ?Ultrasound scrotum on admission showed complex fluid in the left hemiscrotum with numerous thin septations. This may be related to a pyocele or hematocele.  Urology recommends conservative management for now.  His scrotal edema may take several weeks to get better.  Continue scrotal support by scrotal elevation, Compressive underwear and cryotherapy as needed ? ?Patient had left lower extremity pain especially in the calf region.  Considering his difficulty with ambulation, he is at high risk for DVT we will Goeden get a Doppler of both lower extremity to rule out DVT ?

## 2021-07-23 NOTE — Assessment & Plan Note (Signed)
Counseled.  May benefit from outpatient AA ?

## 2021-07-23 NOTE — Assessment & Plan Note (Signed)
Continue metoprolol for rate control.  Not on anticoagulation due to history of alcohol abuse and high risk for bleeding.  Patient has had history of GI bleed with hemorrhagic shock ?

## 2021-07-23 NOTE — Progress Notes (Addendum)
?Progress Note ? ? ?Patient: Marvin Ortega DOB: 1951/01/06 DOA: 07/20/2021     3 ?DOS: the patient was seen and examined on 07/23/2021 ?  ?Brief hospital course: ?71 year old male history of alcoholic liver disease, anemia ,  P atrial fib not on AC due to presumed ETOH use, BPH, COPD , GERD, hx of gi bleed, HTN , prediabetes admitted for left epididymitis with early sepsis  ? ?5/14-continue Levaquin for now.  Await culture ?5/15: Urine culture growing Serratia ?5/16: Blood culture growing Serratia, ID consult ? ? ?Assessment and Plan: ?* Serratia sepsis (HCC) ?As evidenced by tachycardia, leukocytosis.  Likely source is urine.  Urine and blood culture growing Serratia ?We will consult ID, continue Levaquin for now ? ?Epididymitis ?Left-sided epididymitis with sepsis.  Continue Levaquin for now.  Outpatient follow-up with Dr. Lonna Cobb at discharge.  Urine and blood culture growing Serratia ? ?Ultrasound scrotum on admission showed complex fluid in the left hemiscrotum with numerous thin septations. This may be related to a pyocele or hematocele.  Urology recommends conservative management for now.  His scrotal edema may take several weeks to get better.  Continue scrotal support by scrotal elevation, Compressive underwear and cryotherapy as needed ? ?Patient had left lower extremity pain especially in the calf region.  Considering his difficulty with ambulation, he is at high risk for DVT we will Goeden get a Doppler of both lower extremity to rule out DVT ? ?Normocytic anemia ?Hemoglobin 8.3 - Monitor and transfuse for hemoglobin less than 7 ? 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  Outpatient follow-up with Dr. Lonna Cobb for left partial staghorn calculus ? ?PAF (paroxysmal atrial fibrillation) (HCC) ?Continue metoprolol for rate control.  Not on anticoagulation due to history of alcohol abuse and high risk for bleeding.  Patient has had history of GI bleed with hemorrhagic shock ? ?GERD  (gastroesophageal reflux disease) ?Continue Protonix ? ?AA (alcohol abuse) ?Counseled.  May benefit from outpatient AA ? ?Benign prostatic hyperplasia with lower urinary tract symptoms ?History of TURP.  Outpatient urology follow-up with Dr. Lonna Cobb ? ? ? ? ?  ? ?Subjective: Complaining of left lower extremity pain.  Scrotal pain continues ? ?Physical Exam: ?Vitals:  ? 07/22/21 1920 07/23/21 0429 07/23/21 0510 07/23/21 0805  ?BP: 127/81 (!) 143/88  (!) 136/93  ?Pulse: (!) 104 (!) 102  (!) 101  ?Resp: 20 20  16   ?Temp: 99.2 ?F (37.3 ?C) 98.9 ?F (37.2 ?C)  98.3 ?F (36.8 ?C)  ?TempSrc:      ?SpO2: 100% 99%  100%  ?Weight:   71.7 kg   ?Height:      ? ?Constitutional: NAD, calm, comfortable ?Eyes: PERRL, lids and conjunctivae normal ?Neck: normal, supple, no masses, no thyromegaly ?Respiratory: clear to auscultation bilaterally, no wheezing, no crackles. Normal respiratory effort. No accessory muscle use.  ?Cardiovascular: Regular rate and rhythm, no murmurs / rubs / gallops. No extremity edema.  ?Abdomen: no tenderness, no masses palpated. No hepatosplenomegaly. Bowel sounds positive.  ?Musculoskeletal: Mild tenderness of the left lower extremity/calf area ?Skin: no rashes, lesions, ulcers. No induration ?Neurologic: CN 2-12 grossly intact. Sensation intact, Strength 5/5 in all 4.  ?Psychiatric: Normal judgment and insight. Alert and oriented x 3. Normal mood.  ?GU: Mild swollen scrotum , minimal tenderness to touch mild erythema.  He does have significant swelling of the left testicular area and tenderness of epididymis ? ?Data Reviewed: ? ?Hemoglobin 8.3 ? ?Family Communication: Left voicemail to his niece ? ?Disposition: ?Status is: Inpatient ?Remains inpatient  appropriate because: Getting IV antibiotic for Serratia bacteremia ? ? Planned Discharge Destination: Home ? ? ? ?Time spent: 35 minutes ? ?Author: ?Delfino Lovett, MD ?07/23/2021 1:23 PM ? ?For on call review [email or web address]  ?

## 2021-07-23 NOTE — Assessment & Plan Note (Signed)
Chronic and stable.  Outpatient follow-up with Dr. Stoioff for left partial staghorn calculus ?

## 2021-07-23 NOTE — Assessment & Plan Note (Signed)
Hemoglobin 8.3 - Monitor and transfuse for hemoglobin less than 7 ?

## 2021-07-23 NOTE — Progress Notes (Signed)
Urology Inpatient Progress Note ? ?Subjective: ?No acute events overnight.  He is afebrile, VSS. ?WBC count down today, 10.5.  Urine culture growing Serratia marcescens, susceptibilities to follow.  Blood cultures also growing Serratia.  On antibiotics as below. ?He reports improved left testicular pain but ongoing swelling.  He also reports some back pain that is radiating down his left leg and associated with left calf tenderness.  He denies leg swelling and states his right side is unaffected. ? ?Anti-infectives: ?Anti-infectives (From admission, onward)  ? ? Start     Dose/Rate Route Frequency Ordered Stop  ? 07/23/21 1000  levofloxacin (LEVAQUIN) IVPB 750 mg       ? 750 mg ?100 mL/hr over 90 Minutes Intravenous Every 24 hours 07/22/21 2239    ? 07/21/21 1800  cefTRIAXone (ROCEPHIN) 1 g in sodium chloride 0.9 % 100 mL IVPB  Status:  Discontinued       ? 1 g ?200 mL/hr over 30 Minutes Intravenous Every 24 hours 07/20/21 1934 07/21/21 1212  ? 07/21/21 1430  levofloxacin (LEVAQUIN) IVPB 500 mg  Status:  Discontinued       ? 500 mg ?100 mL/hr over 60 Minutes Intravenous Every 24 hours 07/20/21 1705 07/22/21 2239  ? 07/20/21 1800  cefTRIAXone (ROCEPHIN) 2 g in sodium chloride 0.9 % 100 mL IVPB  Status:  Discontinued       ? 2 g ?200 mL/hr over 30 Minutes Intravenous Every 24 hours 07/20/21 1733 07/20/21 1934  ? 07/20/21 1345  levofloxacin (LEVAQUIN) IVPB 500 mg       ? 500 mg ?100 mL/hr over 60 Minutes Intravenous  Once 07/20/21 1340 07/20/21 1531  ? ?  ? ? ?Current Facility-Administered Medications  ?Medication Dose Route Frequency Provider Last Rate Last Admin  ? acetaminophen (TYLENOL) tablet 650 mg  650 mg Oral Q6H PRN Lurline Del, MD   650 mg at 07/22/21 1836  ? Or  ? acetaminophen (TYLENOL) suppository 650 mg  650 mg Rectal Q6H PRN Skip Mayer A, MD      ? albuterol (PROVENTIL) (2.5 MG/3ML) 0.083% nebulizer solution 3 mL  3 mL Inhalation Q6H PRN Lurline Del, MD   3 mL at 07/23/21 0505  ?  aspirin EC tablet 81 mg  81 mg Oral Daily Lurline Del, MD   81 mg at 07/22/21 4765  ? atorvastatin (LIPITOR) tablet 20 mg  20 mg Oral Daily Lurline Del, MD   20 mg at 07/22/21 0902  ? enoxaparin (LOVENOX) injection 40 mg  40 mg Subcutaneous Q24H Skip Mayer A, MD   40 mg at 07/22/21 1836  ? finasteride (PROSCAR) tablet 5 mg  5 mg Oral Daily Skip Mayer A, MD   5 mg at 07/22/21 0902  ? folic acid (FOLVITE) tablet 1 mg  1 mg Oral Daily Skip Mayer A, MD   1 mg at 07/22/21 4650  ? levofloxacin (LEVAQUIN) IVPB 750 mg  750 mg Intravenous Q24H Gertha Calkin, MD      ? loratadine (CLARITIN) tablet 10 mg  10 mg Oral Daily Lurline Del, MD   10 mg at 07/22/21 3546  ? LORazepam (ATIVAN) tablet 1-4 mg  1-4 mg Oral Q1H PRN Lurline Del, MD      ? Or  ? LORazepam (ATIVAN) injection 1-4 mg  1-4 mg Intravenous Q1H PRN Skip Mayer A, MD      ? magnesium sulfate IVPB 4 g 100 mL  4 g Intravenous Once [place],  Ferrel Loganarissa E, RPH      ? metoprolol succinate (TOPROL-XL) 24 hr tablet 100 mg  100 mg Oral Daily Sherryll BurgerShah, Vipul, MD      ? metoprolol tartrate (LOPRESSOR) injection 2.5 mg  2.5 mg Intravenous Q6H PRN Lurline Delhomas, Sara-Maiz A, MD      ? mometasone-formoterol [facility](DULERA) 200-5 MCG/ACT inhaler 2 puff  2 puff Inhalation BID Lurline Delhomas, Sara-Maiz A, MD   2 puff at 07/22/21 2035  ? multivitamin with minerals tablet 1 tablet  1 tablet Oral Daily Lurline Delhomas, Sara-Maiz A, MD   1 tablet at 07/22/21 0902  ? ondansetron (ZOFRAN) tablet 4 mg  4 mg Oral Q6H PRN Lurline Delhomas, Sara-Maiz A, MD      ? Or  ? ondansetron [facility](ZOFRAN) injection 4 mg  4 mg Intravenous Q6H PRN Lurline Delhomas, Sara-Maiz A, MD      ? pantoprazole (PROTONIX) EC tablet 40 mg  40 mg Oral BID Skip Mayerhomas, Sara-Maiz A, MD   40 mg at 07/22/21 2035  ? spironolactone (ALDACTONE) tablet 25 mg  25 mg Oral Daily Lurline Delhomas, Sara-Maiz A, MD   25 mg at 07/22/21 0902  ? thiamine tablet 100 mg  100 mg Oral Daily Skip Mayerhomas, Sara-Maiz A, MD   100 mg at 07/21/21 0957  ? Or  ? thiamine  (B-1) injection 100 mg  100 mg Intravenous Daily Skip Mayerhomas, Sara-Maiz A, MD   100 mg at 07/22/21 [id number]  ? ?Objective: ?Vital signs in last 24 hours: ?Temp:  [98.3 ?F (36.8 ?C)-99.2 ?F (37.3 ?C)] 98.3 ?F (36.8 ?C) (05/16 0805) ?Pulse Rate:  [101-104] 101 (05/16 0805) ?Resp:  [16-20] 16 (05/16 0805) ?BP: (127-143)/(78-93) 136/93 (05/16 0805) ?SpO2:  [99 %-100 %] 100 % (05/16 0805) ?Weight:  [71.7 kg] 71.7 kg (05/16 0510) ? ?Intake/Output from previous day: ?05/15 0701 - 05/16 0700 ?In: -  ?Out: 900 [Urine:900] ?Intake/Output this shift: ?No intake/output data recorded. ? ?Physical Exam ?Vitals and nursing note reviewed.  ?Constitutional:   ?   General: He is not in acute distress. ?   Appearance: He is not ill-appearing, toxic-appearing or diaphoretic.  ?HENT:  ?   Head: Normocephalic and atraumatic.  ?Pulmonary:  ?   Effort: Pulmonary effort is normal. No respiratory distress.  ?Genitourinary: ?   Comments: Significant swelling of the left testicle and epididymis associated with tenderness.  No crepitus or purulence. ?Musculoskeletal:  ?   Left lower leg: Tenderness present. No swelling.  ?Skin: ?   General: Skin is warm and dry.  ?Neurological:  ?   Mental Status: He is alert and oriented to person, place, and time.  ?Psychiatric:     ?   Mood and Affect: Mood normal.     ?   Behavior: Behavior normal.  ? ?Lab Results:  ?Recent Labs  ?  07/22/21 ?0429 07/23/21 ?0424  ?WBC 14.0* 10.5  ?HGB 8.7* 8.3*  ?HCT 26.8* 25.9*  ?PLT 174 174  ? ?BMET ?Recent Labs  ?  07/22/21 ?0429 07/23/21 ?0424  ?NA 134* 134*  ?K 3.1* 3.7  ?CL 104 103  ?CO2 18* 21*  ?GLUCOSE 117* 122*  ?BUN 11 9  ?CREATININE 1.01 0.92  ?CALCIUM 8.0* 7.9*  ? ?Assessment & Plan: ?71 year old male with PMH BPH and nephrolithiasis admitted with left epididymitis with sepsis.  Patient is clinically improving on empiric antibiotics with urine and blood cultures growing Serratia marcescens. ? ?We discussed scrotal support today including scrotal elevation, compressive  underwear, and cryotherapy as needed.  I explained that his edema will likely continue  for several weeks. ? ?LLE pain is likely radicular in nature, however given hospitalization and immobility, DVT is not excluded.  I shared these findings with Dr. Sherryll Burger and defer to him on management. ? ?Okay for discharge from the urologic perspective.  He will require a total of 10 days of culture appropriate antibiotics.  We will arrange outpatient follow-up in 2 weeks. ? ?Carman Ching, PA-C ?07/23/2021  ?

## 2021-07-23 NOTE — Consult Note (Signed)
NAME: Marvin BongoWallace E Vizzini  ?DOB: [birth year]  ?MRN: [id number]  ?Date/Time: 07/23/2021 6:08 PM ? ?REQUESTING PROVIDER: Dr.Shah ?Subjective:  ?REASON FOR CONSULT: serratia bacteremia ?? ?Marvin Ortega is a 71 y.o. with a history of ?Alcohol use disorder, cirrhosis, central lobar emphysema, anemia, BPH, nephrolithiasis with ureteroscopic stone removal on 11/2018, bladder calculus with cystoscopy litholap packs see on 11/28/2018, TURP on 05/2019 on finasteride presents with pain left side of the back and scrotum for the past few days.  Patient is also complaining of frequent urination but emptying less each time.  He did not have any hematuria or dysuria.  He had some chills but no fever ?In the ED BP 153/105, pulse 123, temperature 98.2.  He was noted to have very firm and tender left testicle. ?Labs showed WBC of 18.1, Hb 9.9, platelet 180, creatinine 1.07.  Blood culture and urine analysis and culture was sent.  CT abdomen and pelvis showed nodular liver and nonobstructive kidney stones ?Patient was started on Levaquin. ?I am asked to see the patient has blood cultures positive for Serratia. ?Patient had a urine culture positive in April 2021 for Serratia. ?Patient's urine culture is still pending ?Patient states he is feeling better ?He states he drinks half a pint of gin every day ? ? ?Past Medical History:  ?Diagnosis Date  ? Alcoholic cirrhosis of liver (HCC)   ? Anemia   ? Atrial fibrillation (HCC)   ? Atrial fibrillation (HCC)   ? BPH (benign prostatic hyperplasia)   ? COPD (chronic obstructive pulmonary disease) (HCC)   ? Dieulafoy lesion of stomach   ? GERD (gastroesophageal reflux disease)   ? h/o  ? GIB (gastrointestinal bleeding) 02/15/2018  ? Hemorrhagic shock (HCC)   ? History of kidney stones   ? Hypertension   ? Pre-diabetes   ? Sepsis (HCC)   ? Upper GI bleed   ?  ?Past Surgical History:  ?Procedure Laterality Date  ? CYSTOSCOPY W/ RETROGRADES Right 11/23/2018  ? Procedure: CYSTOSCOPY WITH RETROGRADE  PYELOGRAM;  Surgeon: Riki AltesStoioff, Scott C, MD;  Location: [facility] ORS;  Service: Urology;  Laterality: Right;  ? CYSTOSCOPY WITH HOLMIUM LASER LITHOTRIPSY  11/30/2018  ? Procedure: CYSTOSCOPY WITH HOLMIUM LASER LITHOTRIPSY;  Surgeon: Riki AltesStoioff, Scott C, MD;  Location: [facility];  Service: Urology;;  ? CYSTOSCOPY WITH LITHOLAPAXY N/A 11/23/2018  ? Procedure: CYSTOSCOPY WITH LITHOLAPAXY;  Surgeon: Riki AltesStoioff, Scott C, MD;  Location: [facility];  Service: Urology;  Laterality: N/A;  ? CYSTOSCOPY WITH LITHOLAPAXY N/A 11/30/2018  ? Procedure: CYSTOSCOPY WITH LITHOLAPAXY;  Surgeon: Riki AltesStoioff, Scott C, MD;  Location: [facility];  Service: Urology;  Laterality: N/A;  ? CYSTOSCOPY/URETEROSCOPY/HOLMIUM LASER/STENT PLACEMENT Right 11/23/2018  ? Procedure: CYSTOSCOPY/URETEROSCOPY/HOLMIUM LASER/STENT PLACEMENT;  Surgeon: Riki AltesStoioff, Scott C, MD;  Location: [facility];  Service: Urology;  Laterality: Right;  ? ESOPHAGOGASTRODUODENOSCOPY N/A 02/15/2018  ? Procedure: ESOPHAGOGASTRODUODENOSCOPY (EGD);  Surgeon: Toney ReilVanga, Rohini Reddy, MD;  [facility];  Service: Gastroenterology;  Laterality: N/A;  ? TRANSURETHRAL RESECTION OF PROSTATE N/A 05/24/2019  ? Procedure: TRANSURETHRAL RESECTION OF THE PROSTATE (TURP);  Surgeon: Riki AltesStoioff, Scott C, MD;  Location: [facility];  Service: Urology;  Laterality: N/A;  ?  ?Social History  ? ?Socioeconomic History  ? Marital status: Divorced  ?  Spouse name: Not on file  ? Number of children: Not on file  ? Years of education: Not on file  ? Highest education level: Not on file  ?Occupational History  ? Not on file  ?Tobacco Use  ? Smoking status:  Never  ? Smokeless tobacco: Never  ?Vaping Use  ? Vaping Use: Never used  ?Substance and Sexual Activity  ? Alcohol use: Not Currently  ? Drug use: Never  ? Sexual activity: Yes  ?  Birth control/protection: None  ?Other Topics Concern  ? Not on file  ?Social History Narrative  ? Not on file  ? ?Social Determinants of Health  ? ?Financial Resource Strain: Not on file  ?Food  Insecurity: Not on file  ?Transportation Needs: Not on file  ?Physical Activity: Not on file  ?Stress: Not on file  ?Social Connections: Not on file  ?Intimate Partner Violence: Not on file  ?  ?Family History  ?Problem Relation Age of Onset  ? Heart disease Father   ? Aneurysm Sister   ? ?No Known Allergies ?I? ?Current Facility-Administered Medications  ?Medication Dose Route Frequency Provider Last Rate Last Admin  ? acetaminophen (TYLENOL) tablet 650 mg  650 mg Oral Q6H PRN Lurline Del, MD   650 mg at 07/23/21 1043  ? Or  ? acetaminophen (TYLENOL) suppository 650 mg  650 mg Rectal Q6H PRN Skip Mayer A, MD      ? albuterol (PROVENTIL) (2.5 MG/3ML) 0.083% nebulizer solution 3 mL  3 mL Inhalation Q6H PRN Lurline Del, MD   3 mL at 07/23/21 0505  ? aspirin EC tablet 81 mg  81 mg Oral Daily Lurline Del, MD   81 mg at 07/23/21 0908  ? atorvastatin (LIPITOR) tablet 20 mg  20 mg Oral Daily Skip Mayer A, MD   20 mg at 07/23/21 0908  ? enoxaparin (LOVENOX) injection 40 mg  40 mg Subcutaneous Q24H Skip Mayer A, MD   40 mg at 07/23/21 1751  ? finasteride (PROSCAR) tablet 5 mg  5 mg Oral Daily Skip Mayer A, MD   5 mg at 07/23/21 0908  ? folic acid (FOLVITE) tablet 1 mg  1 mg Oral Daily Skip Mayer A, MD   1 mg at 07/23/21 0908  ? levofloxacin (LEVAQUIN) IVPB 750 mg  750 mg Intravenous Q24H Irena Cords V, MD 100 mL/hr at 07/23/21 0917 750 mg at 07/23/21 0917  ? loratadine (CLARITIN) tablet 10 mg  10 mg Oral Daily Lurline Del, MD   10 mg at 07/23/21 0908  ? metoprolol succinate (TOPROL-XL) 24 hr tablet 100 mg  100 mg Oral Daily Delfino Lovett, MD   100 mg at 07/23/21 0908  ? metoprolol tartrate (LOPRESSOR) injection 2.5 mg  2.5 mg Intravenous Q6H PRN Lurline Del, MD      ? mometasone-formoterol [facility]) 200-5 MCG/ACT inhaler 2 puff  2 puff Inhalation BID Lurline Del, MD   2 puff at 07/23/21 0908  ? multivitamin with minerals tablet 1 tablet  1 tablet  Oral Daily Lurline Del, MD   1 tablet at 07/23/21 0908  ? ondansetron (ZOFRAN) tablet 4 mg  4 mg Oral Q6H PRN Lurline Del, MD      ? Or  ? ondansetron [facility]) injection 4 mg  4 mg Intravenous Q6H PRN Lurline Del, MD      ? pantoprazole (PROTONIX) EC tablet 40 mg  40 mg Oral BID Skip Mayer A, MD   40 mg at 07/23/21 1840  ? spironolactone (ALDACTONE) tablet 25 mg  25 mg Oral Daily Lurline Del, MD   25 mg at 07/23/21 0908  ? thiamine tablet 100 mg  100 mg Oral Daily Lurline Del, MD   100  mg at 07/23/21 0908  ? Or  ? thiamine (B-1) injection 100 mg  100 mg Intravenous Daily Skip Mayer A, MD   100 mg at 07/22/21 8676  ?  ? ?Abtx:  ?Anti-infectives (From admission, onward)  ? ? Start     Dose/Rate Route Frequency Ordered Stop  ? 07/23/21 1000  levofloxacin (LEVAQUIN) IVPB 750 mg       ? 750 mg ?100 mL/hr over 90 Minutes Intravenous Every 24 hours 07/22/21 2239    ? 07/21/21 1800  cefTRIAXone (ROCEPHIN) 1 g in sodium chloride 0.9 % 100 mL IVPB  Status:  Discontinued       ? 1 g ?200 mL/hr over 30 Minutes Intravenous Every 24 hours 07/20/21 1934 07/21/21 1212  ? 07/21/21 1430  levofloxacin (LEVAQUIN) IVPB 500 mg  Status:  Discontinued       ? 500 mg ?100 mL/hr over 60 Minutes Intravenous Every 24 hours 07/20/21 1705 07/22/21 2239  ? 07/20/21 1800  cefTRIAXone (ROCEPHIN) 2 g in sodium chloride 0.9 % 100 mL IVPB  Status:  Discontinued       ? 2 g ?200 mL/hr over 30 Minutes Intravenous Every 24 hours 07/20/21 1733 07/20/21 1934  ? 07/20/21 1345  levofloxacin (LEVAQUIN) IVPB 500 mg       ? 500 mg ?100 mL/hr over 60 Minutes Intravenous  Once 07/20/21 1340 07/20/21 1531  ? ?  ? ? ?REVIEW OF SYSTEMS:  ?Const: negative fever, negative chills, negative weight loss ?Eyes: negative diplopia or visual changes, negative eye pain ?ENT: negative coryza, negative sore throat ?Resp: negative cough, hemoptysis, dyspnea ?Cards: negative for chest pain, palpitations, lower extremity  edema ?GU: as above ?GI: Negative for abdominal pain, diarrhea, bleeding, constipation ?Skin: negative for rash and pruritus ?Heme: negative for easy bruising and gum/nose bleeding ?PP:JKDT flank pain ?Neurolo:nega

## 2021-07-23 NOTE — Assessment & Plan Note (Signed)
As evidenced by tachycardia, leukocytosis.  Likely source is urine.  Urine and blood culture growing Serratia ?We will consult ID, continue Levaquin for now ?

## 2021-07-23 NOTE — Assessment & Plan Note (Signed)
Continue Protonix °

## 2021-07-23 NOTE — Assessment & Plan Note (Signed)
History of TURP.  Outpatient urology follow-up with Dr. Bernardo Heater ?

## 2021-07-24 DIAGNOSIS — A4153 Sepsis due to Serratia: Secondary | ICD-10-CM | POA: Diagnosis not present

## 2021-07-24 DIAGNOSIS — N453 Epididymo-orchitis: Secondary | ICD-10-CM

## 2021-07-24 DIAGNOSIS — A498 Other bacterial infections of unspecified site: Secondary | ICD-10-CM

## 2021-07-24 LAB — URINE CULTURE: Culture: >=100000 — AB

## 2021-07-24 LAB — BASIC METABOLIC PANEL
Anion gap: 10 (ref 5–15)
BUN: 12 mg/dL (ref 8–23)
CO2: 22 mmol/L (ref 22–32)
Calcium: 8.1 mg/dL — ABNORMAL LOW (ref 8.9–10.3)
Chloride: 104 mmol/L (ref 98–111)
Creatinine, Ser: 1 mg/dL (ref 0.61–1.24)
GFR, Estimated: >60 mL/min (ref >60)
Glucose, Bld: 120 mg/dL — ABNORMAL HIGH (ref 70–99)
Potassium: 3.3 mmol/L — ABNORMAL LOW (ref 3.5–5.1)
Sodium: 136 mmol/L (ref 135–145)

## 2021-07-24 LAB — CBC
HCT: 26.4 % — ABNORMAL LOW (ref 39.0–52.0)
Hemoglobin: 8.6 g/dL — ABNORMAL LOW (ref 13.0–17.0)
MCH: 31.9 pg (ref 26.0–34.0)
MCHC: 32.6 g/dL (ref 30.0–36.0)
MCV: 97.8 fL (ref 80.0–100.0)
Platelets: 186 10*3/uL (ref 150–400)
RBC: 2.7 MIL/uL — ABNORMAL LOW (ref 4.22–5.81)
RDW: 16.8 % — ABNORMAL HIGH (ref 11.5–15.5)
WBC: 8.7 10*3/uL (ref 4.0–10.5)
nRBC: 0 % (ref 0.0–0.2)

## 2021-07-24 LAB — MAGNESIUM: Magnesium: 2.2 mg/dL (ref 1.7–2.4)

## 2021-07-24 MED ORDER — LEVOFLOXACIN 750 MG PO TABS
750.0000 mg | ORAL_TABLET | Freq: Every day | ORAL | Status: DC
  Administered 2021-07-25: 750 mg via ORAL
  Filled 2021-07-24: qty 1

## 2021-07-24 MED ORDER — POTASSIUM CHLORIDE CRYS ER 20 MEQ PO TBCR
40.0000 meq | EXTENDED_RELEASE_TABLET | Freq: Once | ORAL | Status: AC
  Administered 2021-07-24: 40 meq via ORAL
  Filled 2021-07-24: qty 2

## 2021-07-24 NOTE — Progress Notes (Signed)
Mobility Specialist - Progress Note ? ? 07/24/21 1400  ?Mobility  ?Activity Ambulated with assistance in hallway  ?Level of Assistance Modified independent, requires aide device or extra time  ?Assistive Device None  ?Distance Ambulated (ft) 320 ft  ?Activity Response Tolerated well  ?$Mobility charge 1 Mobility  ? ? ? ?Pre-mobility: 107 HR, 97% SpO2 ? ? ?Pt ambulated in hallway modI. Able to don LB clothing without assist. SOB and wheezing during ambulation, but no other complaints. Pt left in bed with needs in reach.  ? ? ?Filiberto Pinks ?Mobility Specialist ?07/24/21, 2:41 PM ? ? ? ?

## 2021-07-24 NOTE — TOC Progression Note (Signed)
Transition of Care (TOC) - Progression Note  ? ? ?Patient Details  ?Name: Marvin Ortega ?MRN: [id number] ?Date of Birth: [birth year] ? ?Transition of Care (TOC) CM/SW Contact  ?Chapman Fitch, RN ?Phone Number: ?07/24/2021, 1:49 PM ? ?Clinical Narrative:    ? ?Anticipated patient will discharge on Oral antibiotics.  Requested to MD to notify TOC if IV antibiotics were indicated  ? ?Expected Discharge Plan: Home/Self Care ?  ? ?Expected Discharge Plan and Services ?Expected Discharge Plan: Home/Self Care ?  ?  ?  ?Living arrangements for the past 2 months: Apartment ?                ?  ?  ?  ?  ?  ?  ?  ?  ?  ?  ? ? ?Social Determinants of Health (SDOH) Interventions ?  ? ?Readmission Risk Interventions ? ?  04/01/2019  ?  4:02 PM  ?Readmission Risk Prevention Plan  ?Transportation Screening Complete  ?PCP or Specialist Appt within 3-5 Days Complete  ?HRI or Home Care Consult Patient refused  ?Palliative Care Screening Not Applicable  ?Medication Review Oceanographer) Complete  ? ? ?

## 2021-07-24 NOTE — Assessment & Plan Note (Signed)
Left-sided epididymitis with sepsis.  Continue Levaquin for now.  Outpatient follow-up with Dr. Lonna Cobb at discharge.  Urine and blood culture growing Serratia ? ?Ultrasound scrotum on admission showed complex fluid in the left hemiscrotum with numerous thin septations. This may be related to a pyocele or hematocele.  Urology recommends conservative management for now.  His scrotal edema may take several weeks to get better.  Continue scrotal support by scrotal elevation, Compressive underwear and cryotherapy as needed ? ?Patient had left lower extremity pain especially in the calf region.  Lower extremity venous Doppler was negative for DVT. ?

## 2021-07-24 NOTE — Assessment & Plan Note (Signed)
Chronic and stable.  Outpatient follow-up with Dr. Stoioff for left partial staghorn calculus ?

## 2021-07-24 NOTE — Care Management Important Message (Signed)
Important Message ? ?Patient Details  ?Name: Marvin Ortega ?MRN: [id number] ?Date of Birth: [birth year] ? ? ?Medicare Important Message Given:  Yes ? ? ? ? ?[name] ?07/24/2021, 11:54 AM ?

## 2021-07-24 NOTE — Progress Notes (Signed)
? ?Date of Admission:  07/20/2021    ? ?ID: Marvin Ortega is a 71 y.o. male Principal Problem: ?  Serratia sepsis (HCC) ?Active Problems: ?  Benign prostatic hyperplasia with lower urinary tract symptoms ?  AA (alcohol abuse) ?  GERD (gastroesophageal reflux disease) ?  PAF (paroxysmal atrial fibrillation) (HCC) ?  Ureteral calculi ?  Normocytic anemia ?  Epididymitis ? ? ? ?Subjective: ?Pt still has scrotal pain ?Also c/o left flank pain and left leg pain ? ? ?Medications:  ? aspirin EC  81 mg Oral Daily  ? atorvastatin  20 mg Oral Daily  ? enoxaparin (LOVENOX) injection  40 mg Subcutaneous Q24H  ? finasteride  5 mg Oral Daily  ? folic acid  1 mg Oral Daily  ? loratadine  10 mg Oral Daily  ? metoprolol succinate  100 mg Oral Daily  ? mometasone-formoterol  2 puff Inhalation BID  ? multivitamin with minerals  1 tablet Oral Daily  ? pantoprazole  40 mg Oral BID  ? spironolactone  25 mg Oral Daily  ? thiamine  100 mg Oral Daily  ? ? ?Objective: ?Vital signs in last 24 hours: ?Temp:  [98 ?F (36.7 ?C)-98.7 ?F (37.1 ?C)] 98 ?F (36.7 ?C) (05/17 7829) ?Pulse Rate:  [85-100] 85 (05/17 0721) ?Resp:  [16-20] 18 (05/17 0447) ?BP: (121-149)/(70-98) 130/70 (05/17 0721) ?SpO2:  [98 %-100 %] 99 % (05/17 0721) ?Weight:  [71.2 kg] 71.2 kg (05/17 0421) ? ? ? ?PHYSICAL EXAM:  ?General: Alert, cooperative, no distress, appears stated age.  ?Head: Normocephalic, without obvious abnormality, atraumatic. ?Eyes: Conjunctivae clear, anicteric sclerae. Pupils are equal ?ENT Nares normal. No drainage or sinus tenderness. ?Lips, mucosa, and tongue normal. No Thrush ?Neck: Supple, symmetrical, no adenopathy, thyroid: non tender ?no carotid bruit and no JVD. ?Back: No CVA tenderness. ?Lungs: Clear to auscultation bilaterally. No Wheezing or Rhonchi. No rales. ?Heart: Regular rate and rhythm, no murmur, rub or gallop. ?Abdomen: Soft, non-tender,not distended.  ?Very tender scrotum left side, with induration ?Bowel sounds normal. No  masses ?Extremities: atraumatic, no cyanosis. No edema. No clubbing ?Skin: No rashes or lesions. Or bruising ?Lymph: Cervical, supraclavicular normal. ?Neurologic: Grossly non-focal ? ?Lab Results ?Recent Labs  ?  07/23/21 ?0424 07/24/21 ?0341  ?WBC 10.5 8.7  ?HGB 8.3* 8.6*  ?HCT 25.9* 26.4*  ?NA 134* 136  ?K 3.7 3.3*  ?CL 103 104  ?CO2 21* 22  ?BUN 9 12  ?CREATININE 0.92 1.00  ? ?Microbiology: ?07/20/21 -serratia bacteremia ?UC - serratia ?07/23/21 BC- NG so far ?Studies/Results: ?US Venous Img Lower Bilateral (DVT) ? ?Result Date: 07/23/2021 ?CLINICAL DATA:  Bilateral lower extremity edema.  Evaluate for DVT. EXAM: BILATERAL LOWER EXTREMITY VENOUS DOPPLER ULTRASOUND TECHNIQUE: Gray-scale sonography with graded compression, as well as color Doppler and duplex ultrasound were performed to evaluate the lower extremity deep venous systems from the level of the common femoral vein and including the common femoral, femoral, profunda femoral, popliteal and calf veins including the posterior tibial, peroneal and gastrocnemius veins when visible. The superficial great saphenous vein was also interrogated. Spectral Doppler was utilized to evaluate flow at rest and with distal augmentation maneuvers in the common femoral, femoral and popliteal veins. COMPARISON:  None Available. FINDINGS: RIGHT LOWER EXTREMITY Common Femoral Vein: No evidence of thrombus. Normal compressibility, respiratory phasicity and response to augmentation. Saphenofemoral Junction: No evidence of thrombus. Normal compressibility and flow on color Doppler imaging. Profunda Femoral Vein: No evidence of thrombus. Normal compressibility and flow on color Doppler  imaging. Femoral Vein: No evidence of thrombus. Normal compressibility, respiratory phasicity and response to augmentation. Popliteal Vein: No evidence of thrombus. Normal compressibility, respiratory phasicity and response to augmentation. Calf Veins: No evidence of thrombus. Normal compressibility  and flow on color Doppler imaging. Superficial Great Saphenous Vein: No evidence of thrombus. Normal compressibility. Venous Reflux:  None. Other Findings:  None. LEFT LOWER EXTREMITY Common Femoral Vein: No evidence of thrombus. Normal compressibility, respiratory phasicity and response to augmentation. Saphenofemoral Junction: No evidence of thrombus. Normal compressibility and flow on color Doppler imaging. Profunda Femoral Vein: No evidence of thrombus. Normal compressibility and flow on color Doppler imaging. Femoral Vein: No evidence of thrombus. Normal compressibility, respiratory phasicity and response to augmentation. Popliteal Vein: No evidence of thrombus. Normal compressibility, respiratory phasicity and response to augmentation. Calf Veins: No evidence of thrombus. Normal compressibility and flow on color Doppler imaging. Superficial Great Saphenous Vein: No evidence of thrombus. Normal compressibility. Venous Reflux:  None. Other Findings:  None. IMPRESSION: No evidence of DVT within either lower extremity. Electronically Signed   By: Simonne Come M.D.   On: 07/23/2021 15:15   ? ? ?Assessment/Plan: ??Serratia bacteremia due to complicated UTI with acute epididymo orchitis ?Urine culture has serratia ?On levaquin-oral antibiotic-  may need for 4 weeks ?[place] already done on admission- reviewed ?May ned to repeat as there is so much induration in the scrotum and need to make sure no abscess ?  ?H/o bladder stone ?  ?BPH h/o  TURP in 05/2019 ?  ?Alcohol use disorder ?Liver cirrhosis ?  ?Anemia ?  ?Paroxysmal afib- now in sinus rhythm ? ?Discussed the management with the patient and care team ?  ?

## 2021-07-24 NOTE — Progress Notes (Signed)
?Progress Note ? ? ?Patient: Marvin Ortega YJE:[id number] DOB: [birth year] DOA: 07/20/2021     4 ?DOS: the patient was seen and examined on 07/24/2021 ?  ?Brief hospital course: ?71 year old male history of alcoholic liver disease, anemia ,  P atrial fib not on AC due to presumed ETOH use, BPH, COPD , GERD, hx of gi bleed, HTN , prediabetes admitted for left epididymitis with early sepsis . ? ?Patient has an history of nephrolithiasis with ureteroscopic stone removal on 11/2018, bladder calculus with cystoscopy litholap packs see on 11/28/2018, TURP on 05/2019 on finasteride presents with pain left side of the back and scrotum for the past few days.  ? ?5/14-continue Levaquin for now.  Await culture ?5/15: Urine culture growing Serratia ?5/16: Blood culture growing Serratia, ID consult ? ?5/17: Both blood and urine cultures growing Serratia, pending susceptibility.  ID is recommending 4 weeks of antibiotics, most likely p.o. will make final recommendations today. ? ? ?Assessment and Plan: ?* Serratia sepsis (HCC) ?As evidenced by tachycardia, leukocytosis.  Most likely secondary to complicated UTI. ? Urine and blood culture growing Serratia , pending susceptibilities. ?ID is on board-recommending 4 weeks of antibiotics. ?-continue Levaquin for now ?-Follow on susceptibility results ? ?Epididymitis ?Left-sided epididymitis with sepsis.  Continue Levaquin for now.  Outpatient follow-up with Dr. Lonna Cobb at discharge.  Urine and blood culture growing Serratia ? ?Ultrasound scrotum on admission showed complex fluid in the left hemiscrotum with numerous thin septations. This may be related to a pyocele or hematocele.  Urology recommends conservative management for now.  His scrotal edema may take several weeks to get better.  Continue scrotal support by scrotal elevation, Compressive underwear and cryotherapy as needed ? ?Patient had left lower extremity pain especially in the calf region.  Lower extremity venous Doppler was  negative for DVT. ? 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  Outpatient follow-up with Dr. Lonna Cobb for left partial staghorn calculus ? ?Benign prostatic hyperplasia with lower urinary tract symptoms ?History of TURP.  Outpatient urology follow-up with Dr. Lonna Cobb ? ?AA (alcohol abuse) ?Counseled.  May benefit from outpatient AA ? ?PAF (paroxysmal atrial fibrillation) (HCC) ?Continue metoprolol for rate control.  Not on anticoagulation due to history of alcohol abuse and high risk for bleeding.  Patient has had history of GI bleed with hemorrhagic shock ? ?GERD (gastroesophageal reflux disease) ?Continue Protonix ? ?Normocytic anemia ?Hemoglobin 8.6  ?- Monitor and transfuse for hemoglobin less than 7 ? ?Subjective: Patient was feeling improved when seen today.  Having some left leg pain overnight. ? ?Physical Exam: ?Vitals:  ? 07/23/21 1933 07/24/21 0421 07/24/21 0447 07/24/21 0721  ?BP: 121/79  (!) 149/89 130/70  ?Pulse: 100  85 85  ?Resp: 20  18   ?Temp: 98.7 ?F (37.1 ?C)  98.5 ?F (36.9 ?C) 98 ?F (36.7 ?C)  ?TempSrc: Oral   Oral  ?SpO2: 98%  100% 99%  ?Weight:  71.2 kg    ?Height:      ? ?General.     In no acute distress. ?Pulmonary.  Lungs clear bilaterally, normal respiratory effort. ?CV.  Regular rate and rhythm, no JVD, rub or murmur. ?Abdomen.  Soft, nontender, nondistended, BS positive. ?CNS.  Alert and oriented .  No focal neurologic deficit. ?Extremities.  No edema, no cyanosis, pulses intact and symmetrical. ?Psychiatry.  Judgment and insight appears normal. ? ?Data Reviewed: ?Prior notes, labs and images reviewed ? ?Family Communication: Called niece with no response ? ?Disposition: ?Status is: Inpatient ?Remains inpatient appropriate because: Severity  of illness ? ? Planned Discharge Destination: Home ? ?DVT prophylaxis.  Lovenox ?Time spent: 50 minutes ? ?This record has been created using Conservation officer, historic buildings. Errors have been sought and corrected,but may not always be located. Such  creation errors do not reflect on the standard of care. ? ?Author: ?Arnetha Courser, MD ?07/24/2021 1:46 PM ? ?For on call review [email or web address].  ?

## 2021-07-24 NOTE — Assessment & Plan Note (Addendum)
As evidenced by tachycardia, leukocytosis.  Most likely secondary to complicated UTI. ? Urine and blood culture growing Serratia , pending susceptibilities. ?ID is on board-recommending 4 weeks of antibiotics. ?-continue Levaquin for now ?-Follow on susceptibility results ?

## 2021-07-24 NOTE — Assessment & Plan Note (Signed)
History of TURP.  Outpatient urology follow-up with Dr. Stoioff ?

## 2021-07-24 NOTE — Assessment & Plan Note (Signed)
Hemoglobin 8.6  ?- Monitor and transfuse for hemoglobin less than 7 ?

## 2021-07-25 DIAGNOSIS — A4153 Sepsis due to Serratia: Secondary | ICD-10-CM | POA: Diagnosis not present

## 2021-07-25 LAB — CULTURE, BLOOD (ROUTINE X 2)
Culture: NO GROWTH
Culture: NO GROWTH
Culture: NO GROWTH
Special Requests: ADEQUATE

## 2021-07-25 LAB — BASIC METABOLIC PANEL
Anion gap: 9 (ref 5–15)
BUN: 11 mg/dL (ref 8–23)
CO2: 23 mmol/L (ref 22–32)
Calcium: 8.1 mg/dL — ABNORMAL LOW (ref 8.9–10.3)
Chloride: 103 mmol/L (ref 98–111)
Creatinine, Ser: 1.01 mg/dL (ref 0.61–1.24)
GFR, Estimated: >60 mL/min (ref >60)
Glucose, Bld: 119 mg/dL — ABNORMAL HIGH (ref 70–99)
Potassium: 3.3 mmol/L — ABNORMAL LOW (ref 3.5–5.1)
Sodium: 135 mmol/L (ref 135–145)

## 2021-07-25 MED ORDER — METOPROLOL SUCCINATE ER 100 MG PO TB24: 100.0000 mg | ORAL_TABLET | Freq: Every day | ORAL | 1 refills | Status: DC

## 2021-07-25 MED ORDER — LEVOFLOXACIN 750 MG PO TABS: 750.0000 mg | ORAL_TABLET | Freq: Every day | ORAL | 0 refills | Status: AC

## 2021-07-25 MED ORDER — FOLIC ACID 1 MG PO TABS: 1.0000 mg | ORAL_TABLET | Freq: Every day | ORAL | 0 refills | Status: DC

## 2021-07-25 MED ORDER — ADULT MULTIVITAMIN W/MINERALS CH: 1.0000 | ORAL_TABLET | Freq: Every day | ORAL | 0 refills | Status: DC

## 2021-07-25 NOTE — Progress Notes (Signed)
Discharge instructions given to patient. Patient verbalized understanding. All questions and concerns have been addressed. PIV removed with no complications. Patient states that his cousin will be coming to pick him up.

## 2021-07-25 NOTE — Discharge Summary (Signed)
Physician Discharge Summary   Patient: Marvin Ortega E Lacock MRN: [id number] DOB: Aug 06, 1950  Admit date:     07/20/2021  Discharge date: 07/25/21  Discharge Physician: Arnetha CourserSumayya Feliz Herard   PCP: Louis MatteEntzminger, Lakeshia A, MD   Recommendations at discharge:  Please obtain CBC and CMP in 1 week Follow-up with primary care provider within a week Follow-up with urology in 1 to 2 weeks  Discharge Diagnoses: Principal Problem:   Serratia sepsis (HCC) Active Problems:   Epididymitis   Ureteral calculi   Benign prostatic hyperplasia with lower urinary tract symptoms   AA (alcohol abuse)   PAF (paroxysmal atrial fibrillation) (HCC)   GERD (gastroesophageal reflux disease)   Normocytic anemia  Resolved Problems:   Bacterial infection due to Serratia  Hospital Course: 71 year old male history of alcoholic liver disease, anemia ,  P atrial fib not on AC due to presumed ETOH use, BPH, COPD , GERD, hx of gi bleed, HTN , prediabetes admitted for left epididymitis with early sepsis .  Patient has an history of nephrolithiasis with ureteroscopic stone removal on 11/2018, bladder calculus with cystoscopy litholap packs see on 11/28/2018, TURP on 05/2019 on finasteride presents with pain left side of the back and scrotum for the past few days.   5/14-continue Levaquin for now.  Await culture 5/15: Urine culture growing Serratia 5/16: Blood culture growing Serratia, ID consult  5/17: Both blood and urine cultures growing Serratia, pending susceptibility.  ID is recommending 4 weeks of antibiotics, most likely p.o. will make final recommendations today.  5/18: Patient remained afebrile and improving.  Wants to go home.  Repeat blood cultures remain negative.  ID is recommending total of 4 weeks of Levaquin.  Patient received about 1 week dose as IV in the hospital and discharged on 3 more weeks of Levaquin.  He will follow-up with his providers for further recommendations.  Patient also need to see his urologist  for further management.  He will continue with current medications.  Assessment and Plan: * Serratia sepsis (HCC) As evidenced by tachycardia, leukocytosis.  Most likely secondary to complicated UTI.  Urine and blood culture growing Serratia , pending susceptibilities. ID is on board-recommending 4 weeks of antibiotics. -continue Levaquin for now -Follow on susceptibility results  Epididymitis Left-sided epididymitis with sepsis.  Continue Levaquin for now.  Outpatient follow-up with Dr. Lonna CobbStoioff at discharge.  Urine and blood culture growing Serratia  Ultrasound scrotum on admission showed complex fluid in the left hemiscrotum with numerous thin septations. This may be related to a pyocele or hematocele.  Urology recommends conservative management for now.  His scrotal edema may take several weeks to get better.  Continue scrotal support by scrotal elevation, Compressive underwear and cryotherapy as needed  Patient had left lower extremity pain especially in the calf region.  Lower extremity venous Doppler was negative for DVT.  Ureteral calculi Chronic and stable.  Outpatient follow-up with Dr. Lonna CobbStoioff for left partial staghorn calculus  Benign prostatic hyperplasia with lower urinary tract symptoms History of TURP.  Outpatient urology follow-up with Dr. Lonna CobbStoioff  AA (alcohol abuse) Counseled.  May benefit from outpatient AA  PAF (paroxysmal atrial fibrillation) (HCC) Continue metoprolol for rate control.  Not on anticoagulation due to history of alcohol abuse and high risk for bleeding.  Patient has had history of GI bleed with hemorrhagic shock  GERD (gastroesophageal reflux disease) Continue Protonix  Normocytic anemia Hemoglobin 8.6  - Monitor and transfuse for hemoglobin less than 7   Consultants: Infectious disease Procedures performed:  None Disposition: Home Diet recommendation:  Discharge Diet Orders (From admission, onward)     Start     Ordered   07/25/21 0000   Diet - low sodium heart healthy        07/25/21 1033           Cardiac and Carb modified diet DISCHARGE MEDICATION: Allergies as of 07/25/2021   No Known Allergies      Medication List     TAKE these medications    albuterol 108 (90 Base) MCG/ACT inhaler Commonly known as: VENTOLIN HFA Inhale 2 puffs into the lungs every 6 (six) hours as needed for wheezing or shortness of breath.   aspirin EC 81 MG tablet Take 81 mg by mouth daily.   atorvastatin 20 MG tablet Commonly known as: LIPITOR Take 1 tablet by mouth daily.   Breztri Aerosphere 160-9-4.8 MCG/ACT Aero Generic drug: Budeson-Glycopyrrol-Formoterol Inhale 2 puffs into the lungs in the morning and at bedtime. What changed: Another medication with the same name was removed. Continue taking this medication, and follow the directions you see here.   budesonide-formoterol 160-4.5 MCG/ACT inhaler Commonly known as: SYMBICORT Inhale 2 puffs into the lungs 2 (two) times daily.   finasteride 5 MG tablet Commonly known as: PROSCAR Take 5 mg by mouth every morning.   folic acid 1 MG tablet Commonly known as: FOLVITE Take 1 tablet (1 mg total) by mouth daily.   levofloxacin 750 MG tablet Commonly known as: LEVAQUIN Take 1 tablet (750 mg total) by mouth daily for 21 days.   loratadine 10 MG tablet Commonly known as: CLARITIN Take 10 mg by mouth daily.   metoprolol succinate 100 MG 24 hr tablet Commonly known as: TOPROL-XL Take 1 tablet (100 mg total) by mouth daily. Take with or immediately following a meal. What changed:  medication strength how much to take additional instructions   multivitamin with minerals Tabs tablet Take 1 tablet by mouth daily.   pantoprazole 40 MG tablet Commonly known as: PROTONIX Take 1 tablet (40 mg total) by mouth 2 (two) times daily.   spironolactone 25 MG tablet Commonly known as: ALDACTONE 1 tablet daily.   vitamin B-1 250 MG tablet Take 250 mg by mouth daily.         Follow-up Information     Riki Altes, MD. Go on 08/08/2021.   Specialty: Urology Why: Please call to make an appointment in the office for 1-2 weeks from discharge.   11:15am appointment Contact information: [address] [place] [address] [phone]         Entzminger, Danton Clap, MD. Schedule an appointment as soon as possible for a visit in 1 week(s).   Specialty: Internal Medicine Contact information: [address], [address] [place] [address] [phone]                Discharge Exam: Ceasar Mons Weights   07/21/21 0750 07/23/21 0510 07/24/21 0421  Weight: 72.6 kg 71.7 kg 71.2 kg   General.     In no acute distress. Pulmonary.  Lungs clear bilaterally, normal respiratory effort. CV.  Regular rate and rhythm, no JVD, rub or murmur. Abdomen.  Soft, nontender, nondistended, BS positive. CNS.  Alert and oriented .  No focal neurologic deficit. Extremities.  No edema, no cyanosis, pulses intact and symmetrical. Psychiatry.  Judgment and insight appears normal.   Condition at discharge: stable  The results of significant diagnostics from this hospitalization (including imaging, microbiology, ancillary and  laboratory) are listed below for reference.   Imaging Studies: CT Abdomen Pelvis W Contrast  Result Date: 07/20/2021 CLINICAL DATA:  Pt reports has some kidney issues and over the last week or more he has felt uncomfortable in his abd and back. Pt reports has not been able to fully empty his bladder in the past 24 hours. Pt describes the pain as achy and uncomfortable. EXAM: CT ABDOMEN AND PELVIS WITH CONTRAST TECHNIQUE: Multidetector CT imaging of the abdomen and pelvis was performed using the standard protocol following bolus administration of intravenous contrast. RADIATION DOSE REDUCTION: This exam was performed according to the departmental dose-optimization program which includes automated exposure control, adjustment of  the mA and/or kV according to patient size and/or use of iterative reconstruction technique. CONTRAST:  OMNIPAQUE IOHEXOL 300 MG/ML  SOLN COMPARISON:  06/25/2019 FINDINGS: Lower chest: No acute findings.  Emphysema. Hepatobiliary: Liver normal in overall size and attenuation. Relative enlargement the caudate lobe and anterior surface nodularity suggests cirrhosis. No liver mass. Several dependent gallstones. No gallbladder wall thickening or adjacent inflammation. No bile duct dilation. Pancreas: Unremarkable. No pancreatic ductal dilatation or surrounding inflammatory changes. Spleen: Normal in size without focal abnormality. Adrenals/Urinary Tract: No adrenal masses. Right renal cortical thinning. Left lower pole renal collecting stones, largest 1.8 cm. No right intrarenal stones. Exophytic cortical cyst, anterior lower pole the left kidney, 1.5 cm. No hydronephrosis. Ureters are normal in course and in caliber. Bladder is unremarkable. Stomach/Bowel: Normal stomach. Small bowel and colon are normal in caliber. No wall thickening. No inflammation. Scattered colonic diverticula. No diverticulitis. Normal appendix. Vascular/Lymphatic: Mildly prominent gastrohepatic ligament lymph nodes, stable. There are no enlarged lymph nodes. Mild aortic atherosclerosis. No aneurysm. Reproductive: TURP defect.  Prostate normal overall size. Other: Fat containing right inguinal hernia.  No ascites. Musculoskeletal: No fracture or acute finding.  No bone lesion. IMPRESSION: 1. No acute findings within the abdomen or pelvis. 2. Liver morphologic changes consistent with cirrhosis.  Gallstones. 3. Nonobstructing stones in the lower pole left kidney, stable. 4. Mild aortic atherosclerosis. Electronically Signed   By: Amie Portland M.D.   On: 07/20/2021 11:47   US Venous Img Lower Bilateral (DVT)  Result Date: 07/23/2021 CLINICAL DATA:  Bilateral lower extremity edema.  Evaluate for DVT. EXAM: BILATERAL LOWER EXTREMITY VENOUS  DOPPLER ULTRASOUND TECHNIQUE: Gray-scale sonography with graded compression, as well as color Doppler and duplex ultrasound were performed to evaluate the lower extremity deep venous systems from the level of the common femoral vein and including the common femoral, femoral, profunda femoral, popliteal and calf veins including the posterior tibial, peroneal and gastrocnemius veins when visible. The superficial great saphenous vein was also interrogated. Spectral Doppler was utilized to evaluate flow at rest and with distal augmentation maneuvers in the common femoral, femoral and popliteal veins. COMPARISON:  None Available. FINDINGS: RIGHT LOWER EXTREMITY Common Femoral Vein: No evidence of thrombus. Normal compressibility, respiratory phasicity and response to augmentation. Saphenofemoral Junction: No evidence of thrombus. Normal compressibility and flow on color Doppler imaging. Profunda Femoral Vein: No evidence of thrombus. Normal compressibility and flow on color Doppler imaging. Femoral Vein: No evidence of thrombus. Normal compressibility, respiratory phasicity and response to augmentation. Popliteal Vein: No evidence of thrombus. Normal compressibility, respiratory phasicity and response to augmentation. Calf Veins: No evidence of thrombus. Normal compressibility and flow on color Doppler imaging. Superficial Great Saphenous Vein: No evidence of thrombus. Normal compressibility. Venous Reflux:  None. Other Findings:  None. LEFT LOWER EXTREMITY Common Femoral Vein: No  evidence of thrombus. Normal compressibility, respiratory phasicity and response to augmentation. Saphenofemoral Junction: No evidence of thrombus. Normal compressibility and flow on color Doppler imaging. Profunda Femoral Vein: No evidence of thrombus. Normal compressibility and flow on color Doppler imaging. Femoral Vein: No evidence of thrombus. Normal compressibility, respiratory phasicity and response to augmentation. Popliteal Vein: No  evidence of thrombus. Normal compressibility, respiratory phasicity and response to augmentation. Calf Veins: No evidence of thrombus. Normal compressibility and flow on color Doppler imaging. Superficial Great Saphenous Vein: No evidence of thrombus. Normal compressibility. Venous Reflux:  None. Other Findings:  None. IMPRESSION: No evidence of DVT within either lower extremity. Electronically Signed   By: Simonne Come M.D.   On: 07/23/2021 15:15   US SCROTUM W/DOPPLER  Result Date: 07/20/2021 CLINICAL DATA:  Pain and swelling. EXAM: SCROTAL ULTRASOUND DOPPLER ULTRASOUND OF THE TESTICLES TECHNIQUE: Complete ultrasound examination of the testicles, epididymis, and other scrotal structures was performed. Color and spectral Doppler ultrasound were also utilized to evaluate blood flow to the testicles. COMPARISON:  07/20/2009 FINDINGS: Right testicle Measurements: 3.3 x 1.7 x 2.7 cm. Parenchyma is heterogeneous and diffusely hypoechoic, similar to the remote study although right testicle measures smaller today than on the previous exam. No discrete or focal mass lesion evident. Left testicle Measurements: 4.6 x 3.2 x 3.2 cm. No mass or microlithiasis visualized. Right epididymis: 19 mm epididymal cyst or spermatocele evident, similar to prior. Left epididymis: Left epididymis appears complex and hyperemic with 10 mm epididymal cyst or spermatocele evident. Hydrocele: Complex fluid in the left hemiscrotum demonstrates numerous thin septations. Varicocele:  None visualized. Pulsed Doppler interrogation of both testes demonstrates normal low resistance arterial and venous waveforms bilaterally. IMPRESSION: 1. Heterogeneous echotexture and hyperemia of the left epididymis with slight enlargement of the left epididymis. Imaging features are most suggestive of left epididymitis. 2. Complex fluid in the left hemiscrotum with numerous thin septations. This may be related to a pyocele or hematocele. 3. Right testicle remains  smaller than the left with heterogeneous echotexture of the right testicle again noted but no discrete mass lesion identified. The right testicle has decreased in size since the prior exam. 4. Bilateral epididymal cysts or spermatoceles. 5. No evidence for testicular torsion. Electronically Signed   By: Kennith Center M.D.   On: 07/20/2021 11:30    Microbiology: Results for orders placed or performed during the hospital encounter of 07/20/21  Urine Culture     Status: Abnormal   Collection Time: 07/20/21 10:10 AM   Specimen: Urine, Clean Catch  Result Value Ref Range Status   Specimen Description   Final    URINE, CLEAN CATCH Performed at [facility], [address], [place], [place] [address]    Special Requests   Final    NONE Performed at [facility], [address] [place], [place] [address]    Culture >=100,000 COLONIES/mL SERRATIA MARCESCENS (A)  Final   Report Status 07/24/2021 FINAL  Final   Organism ID, Bacteria SERRATIA MARCESCENS (A)  Final      Susceptibility   Serratia marcescens - MIC*    CEFAZOLIN >=64 RESISTANT Resistant     CEFEPIME <=0.12 SENSITIVE Sensitive     CEFTRIAXONE <=0.25 SENSITIVE Sensitive     CIPROFLOXACIN <=0.25 SENSITIVE Sensitive     GENTAMICIN <=1 SENSITIVE Sensitive     NITROFURANTOIN 128 RESISTANT Resistant     TRIMETH/SULFA <=20 SENSITIVE Sensitive     * >=100,000 COLONIES/mL SERRATIA MARCESCENS  Culture, blood (routine x 2)  Status: None   Collection Time: 07/20/21  1:45 PM   Specimen: BLOOD RIGHT ARM  Result Value Ref Range Status   Specimen Description BLOOD RIGHT ARM  Final   Special Requests   Final    BOTTLES DRAWN AEROBIC AND ANAEROBIC Blood Culture adequate volume   Culture   Final    NO GROWTH 5 DAYS Performed at [facility], [address] [place], [place] [address]    Report Status 07/25/2021 FINAL  Final  Culture, blood (routine x 2)     Status: Abnormal   Collection Time: 07/20/21  1:52 PM    Specimen: BLOOD RIGHT ARM  Result Value Ref Range Status   Specimen Description   Final    BLOOD RIGHT ARM Performed at [facility], [address] [place], [place] [address]    Special Requests   Final    BOTTLES DRAWN AEROBIC AND ANAEROBIC Blood Culture results may not be optimal due to an excessive volume of blood received in culture bottles Performed at [facility], [address] [place], [place] [address]    Culture  Setup Time   Final    GRAM NEGATIVE RODS AEROBIC BOTTLE ONLY CRITICAL RESULT CALLED TO, READ BACK BY AND VERIFIED WITH: BRANDON BEERS 07/22/21 2209 MU    Culture SERRATIA MARCESCENS (A)  Final   Report Status 07/25/2021 FINAL  Final   Organism ID, Bacteria SERRATIA MARCESCENS  Final      Susceptibility   Serratia marcescens - MIC*    CEFAZOLIN >=64 RESISTANT Resistant     CEFEPIME <=0.12 SENSITIVE Sensitive     CEFTAZIDIME <=1 SENSITIVE Sensitive     CEFTRIAXONE <=0.25 SENSITIVE Sensitive     CIPROFLOXACIN <=0.25 SENSITIVE Sensitive     GENTAMICIN <=1 SENSITIVE Sensitive     TRIMETH/SULFA <=20 SENSITIVE Sensitive     * SERRATIA MARCESCENS  Blood Culture ID Panel (Reflexed)     Status: Abnormal   Collection Time: 07/20/21  1:52 PM  Result Value Ref Range Status   Enterococcus faecalis NOT DETECTED NOT DETECTED Final   Enterococcus Faecium NOT DETECTED NOT DETECTED Final   Listeria monocytogenes NOT DETECTED NOT DETECTED Final   Staphylococcus species NOT DETECTED NOT DETECTED Final   Staphylococcus aureus (BCID) NOT DETECTED NOT DETECTED Final   Staphylococcus epidermidis NOT DETECTED NOT DETECTED Final   Staphylococcus lugdunensis NOT DETECTED NOT DETECTED Final   Streptococcus species NOT DETECTED NOT DETECTED Final   Streptococcus agalactiae NOT DETECTED NOT DETECTED Final   Streptococcus pneumoniae NOT DETECTED NOT DETECTED Final   Streptococcus pyogenes NOT DETECTED NOT DETECTED Final   A.calcoaceticus-baumannii NOT DETECTED  NOT DETECTED Final   Bacteroides fragilis NOT DETECTED NOT DETECTED Final   Enterobacterales DETECTED (A) NOT DETECTED Final    Comment: Enterobacterales represent a large order of gram negative bacteria, not a single organism. CRITICAL RESULT CALLED TO, READ BACK BY AND VERIFIED WITH: BRANDON BEERS 07/22/21 2209 MU    Enterobacter cloacae complex NOT DETECTED NOT DETECTED Final   Escherichia coli NOT DETECTED NOT DETECTED Final   Klebsiella aerogenes NOT DETECTED NOT DETECTED Final   Klebsiella oxytoca NOT DETECTED NOT DETECTED Final   Klebsiella pneumoniae NOT DETECTED NOT DETECTED Final   Proteus species NOT DETECTED NOT DETECTED Final   Salmonella species NOT DETECTED NOT DETECTED Final   Serratia marcescens DETECTED (A) NOT DETECTED Final    Comment: CRITICAL RESULT CALLED TO, READ BACK BY AND VERIFIED WITH: BRANDON BEERS 07/22/21 2209 MU  Haemophilus influenzae NOT DETECTED NOT DETECTED Final   Neisseria meningitidis NOT DETECTED NOT DETECTED Final   Pseudomonas aeruginosa NOT DETECTED NOT DETECTED Final   Stenotrophomonas maltophilia NOT DETECTED NOT DETECTED Final   Candida albicans NOT DETECTED NOT DETECTED Final   Candida auris NOT DETECTED NOT DETECTED Final   Candida glabrata NOT DETECTED NOT DETECTED Final   Candida krusei NOT DETECTED NOT DETECTED Final   Candida parapsilosis NOT DETECTED NOT DETECTED Final   Candida tropicalis NOT DETECTED NOT DETECTED Final   Cryptococcus neoformans/gattii NOT DETECTED NOT DETECTED Final   CTX-M ESBL NOT DETECTED NOT DETECTED Final   Carbapenem resistance IMP NOT DETECTED NOT DETECTED Final   Carbapenem resistance KPC NOT DETECTED NOT DETECTED Final   Carbapenem resistance NDM NOT DETECTED NOT DETECTED Final   Carbapenem resist OXA 48 LIKE NOT DETECTED NOT DETECTED Final   Carbapenem resistance VIM NOT DETECTED NOT DETECTED Final    Comment: Performed at [facility], [address] [place], [place] [address]  Culture,  blood (Routine X 2) w Reflex to ID Panel     Status: None   Collection Time: 07/20/21  6:16 PM   Specimen: BLOOD  Result Value Ref Range Status   Specimen Description BLOOD BOTTLES DRAWN AEROBIC AND ANAEROBIC  Final   Special Requests   Final    Blood Culture results may not be optimal due to an inadequate volume of blood received in culture bottles BLOOD LEFT WRIST   Culture   Final    NO GROWTH 5 DAYS Performed at [facility], [address] [place], [place] [address]    Report Status 07/25/2021 FINAL  Final  Culture, blood (Routine X 2) w Reflex to ID Panel     Status: None   Collection Time: 07/20/21  6:16 PM   Specimen: BLOOD  Result Value Ref Range Status   Specimen Description BLOOD BOTTLES DRAWN AEROBIC AND ANAEROBIC  Final   Special Requests   Final    Blood Culture results may not be optimal due to an inadequate volume of blood received in culture bottles LEFT ANTECUBITAL   Culture   Final    NO GROWTH 5 DAYS Performed at [facility], [address] [place], [place] [address]    Report Status 07/25/2021 FINAL  Final  Culture, blood (Routine X 2) w Reflex to ID Panel     Status: None (Preliminary result)   Collection Time: 07/23/21  4:55 PM   Specimen: BLOOD RIGHT HAND  Result Value Ref Range Status   Specimen Description BLOOD RIGHT HAND  Final   Special Requests   Final    BOTTLES DRAWN AEROBIC AND ANAEROBIC Blood Culture adequate volume   Culture   Final    NO GROWTH 2 DAYS Performed at [facility], [address] [place], [place] [address]    Report Status PENDING  Incomplete  Culture, blood (Routine X 2) w Reflex to ID Panel     Status: None (Preliminary result)   Collection Time: 07/23/21  4:55 PM   Specimen: BLOOD LEFT HAND  Result Value Ref Range Status   Specimen Description BLOOD LEFT HAND  Final   Special Requests   Final    BOTTLES DRAWN AEROBIC AND ANAEROBIC Blood Culture adequate volume   Culture   Final    NO  GROWTH 2 DAYS Performed at [facility], [address], [place], [place] [address]    Report Status PENDING  Incomplete  Labs: CBC: Recent Labs  Lab 07/20/21 1010 07/21/21 0443 07/22/21 0429 07/23/21 0424 07/24/21 0341  WBC 18.1* 17.8* 14.0* 10.5 8.7  HGB 9.9* 8.4* 8.7* 8.3* 8.6*  HCT 31.1* 26.1* 26.8* 25.9* 26.4*  MCV 97.5 97.4 96.8 97.4 97.8  PLT 180 166 174 174 186   Basic Metabolic Panel: Recent Labs  Lab 07/21/21 0443 07/22/21 0429 07/23/21 0424 07/24/21 0341 07/25/21 0429  NA 137 134* 134* 136 135  K 3.2* 3.1* 3.7 3.3* 3.3*  CL 105 104 103 104 103  CO2 20* 18* 21* 22 23  GLUCOSE 100* 117* 122* 120* 119*  BUN CREATININE 1.08 1.01 0.92 1.00 1.01  CALCIUM 7.9* 8.0* 7.9* 8.1* 8.1*  MG  --  1.2*  --  2.2  --    Liver Function Tests: Recent Labs  Lab 07/20/21 1010 07/21/21 0443  AST 40 30  ALT 27 19  ALKPHOS 118 114  BILITOT 1.4* 1.6*  PROT 7.6 6.2*  ALBUMIN 3.5 2.7*   CBG: No results for input(s): GLUCAP in the last 168 hours.  Discharge time spent: greater than 30 minutes.  This record has been created using Conservation officer, historic buildings. Errors have been sought and corrected,but may not always be located. Such creation errors do not reflect on the standard of care.   Signed: Arnetha Courser, MD [facility] 07/25/2021

## 2021-07-28 LAB — CULTURE, BLOOD (ROUTINE X 2)
Culture: NO GROWTH
Culture: NO GROWTH
Special Requests: ADEQUATE
Special Requests: ADEQUATE

## 2021-08-08 ENCOUNTER — Encounter: Payer: Self-pay | Admitting: Urology

## 2021-08-08 ENCOUNTER — Ambulatory Visit (INDEPENDENT_AMBULATORY_CARE_PROVIDER_SITE_OTHER): Payer: Medicare HMO | Admitting: Urology

## 2021-08-08 VITALS — BP 93/63 | HR 89 | Ht 66.0 in | Wt 144.0 lb

## 2021-08-08 DIAGNOSIS — N401 Enlarged prostate with lower urinary tract symptoms: Secondary | ICD-10-CM | POA: Diagnosis not present

## 2021-08-08 DIAGNOSIS — R3912 Poor urinary stream: Secondary | ICD-10-CM | POA: Diagnosis not present

## 2021-08-08 DIAGNOSIS — Z87442 Personal history of urinary calculi: Secondary | ICD-10-CM

## 2021-08-08 DIAGNOSIS — N451 Epididymitis: Secondary | ICD-10-CM

## 2021-08-08 DIAGNOSIS — N200 Calculus of kidney: Secondary | ICD-10-CM

## 2021-08-08 DIAGNOSIS — Z87438 Personal history of other diseases of male genital organs: Secondary | ICD-10-CM

## 2021-08-08 LAB — BLADDER SCAN AMB NON-IMAGING: Scan Result: 0

## 2021-08-08 NOTE — Progress Notes (Signed)
08/08/2021 11:19 AM   Marvin Ortega 1950/05/19 [id number]  Referring provider: No referring provider defined for this encounter.  Chief Complaint  Patient presents with   Benign Prostatic Hypertrophy    Urologic history:  1.  Nephrolithiasis Ureteroscopic stone removal 11/2018 Asymptomatic left lower pole partial staghorn calculus  2.  History bladder calculus Cystolitholapaxy 11/2018 Declined outlet procedure  3.  BPH with urinary retention TURP 05/2019   HPI: 71 y.o. male presents in follow-up of recent hospitalization for epididymitis.  Admitted [facility] May 13-18 for UTI/epididymitis Was seen by Dr. Annabell Howells in consult and exam consistent with epididymitis and reactive hydrocele He states his scrotal swelling has improved and pain has resolved Does complain of a slow urinary stream.  PVR on admission was negligible Urine culture did grow Serratia as well as blood cultures Was seen by ID who recommended a 4-week course of Levaquin.  He received 1 week in the hospital IV and was discharged with a 3-week course which he is still taking   PMH: Past Medical History:  Diagnosis Date   Alcoholic cirrhosis of liver (HCC)    Anemia    Atrial fibrillation (HCC)    Atrial fibrillation (HCC)    BPH (benign prostatic hyperplasia)    COPD (chronic obstructive pulmonary disease) (HCC)    Dieulafoy lesion of stomach    GERD (gastroesophageal reflux disease)    h/o   GIB (gastrointestinal bleeding) 02/15/2018   Hemorrhagic shock (HCC)    History of kidney stones    Hypertension    Pre-diabetes    Sepsis (HCC)    Upper GI bleed     Surgical History: Past Surgical History:  Procedure Laterality Date   CYSTOSCOPY W/ RETROGRADES Right 11/23/2018   Procedure: CYSTOSCOPY WITH RETROGRADE PYELOGRAM;  Surgeon: Riki Altes, MD;  Location: [facility];  Service: Urology;  Laterality: Right;   CYSTOSCOPY WITH HOLMIUM LASER LITHOTRIPSY  11/30/2018   Procedure: CYSTOSCOPY WITH  HOLMIUM LASER LITHOTRIPSY;  Surgeon: Riki Altes, MD;  Location: [facility];  Service: Urology;;   CYSTOSCOPY WITH LITHOLAPAXY N/A 11/23/2018   Procedure: CYSTOSCOPY WITH LITHOLAPAXY;  Surgeon: Riki Altes, MD;  Location: [facility];  Service: Urology;  Laterality: N/A;   CYSTOSCOPY WITH LITHOLAPAXY N/A 11/30/2018   Procedure: CYSTOSCOPY WITH LITHOLAPAXY;  Surgeon: Riki Altes, MD;  Location: [facility];  Service: Urology;  Laterality: N/A;   CYSTOSCOPY/URETEROSCOPY/HOLMIUM LASER/STENT PLACEMENT Right 11/23/2018   Procedure: CYSTOSCOPY/URETEROSCOPY/HOLMIUM LASER/STENT PLACEMENT;  Surgeon: Riki Altes, MD;  Location: [facility];  Service: Urology;  Laterality: Right;   ESOPHAGOGASTRODUODENOSCOPY N/A 02/15/2018   Procedure: ESOPHAGOGASTRODUODENOSCOPY (EGD);  Surgeon: Toney Reil, MD;  Location: [facility];  Service: Gastroenterology;  Laterality: N/A;   TRANSURETHRAL RESECTION OF PROSTATE N/A 05/24/2019   Procedure: TRANSURETHRAL RESECTION OF THE PROSTATE (TURP);  Surgeon: Riki Altes, MD;  Location: [facility];  Service: Urology;  Laterality: N/A;    Home Medications:  Allergies as of 08/08/2021   No Known Allergies      Medication List        Accurate as of August 08, 2021 11:19 AM. If you have any questions, ask your nurse or doctor.          albuterol 108 (90 Base) MCG/ACT inhaler Commonly known as: VENTOLIN HFA Inhale 2 puffs into the lungs every 6 (six) hours as needed for wheezing or shortness of breath.   aspirin EC 81 MG tablet Take 81 mg by mouth daily.   atorvastatin 20  MG tablet Commonly known as: LIPITOR Take 1 tablet by mouth daily.   Breztri Aerosphere 160-9-4.8 MCG/ACT Aero Generic drug: Budeson-Glycopyrrol-Formoterol Inhale 2 puffs into the lungs in the morning and at bedtime.   budesonide-formoterol 160-4.5 MCG/ACT inhaler Commonly known as: SYMBICORT Inhale 2 puffs into the lungs 2 (two) times daily.   finasteride 5 MG tablet Commonly  known as: PROSCAR Take 5 mg by mouth every morning.   folic acid 1 MG tablet Commonly known as: FOLVITE Take 1 tablet (1 mg total) by mouth daily.   levofloxacin 750 MG tablet Commonly known as: LEVAQUIN Take 1 tablet (750 mg total) by mouth daily for 21 days.   loratadine 10 MG tablet Commonly known as: CLARITIN Take 10 mg by mouth daily.   metoprolol succinate 100 MG 24 hr tablet Commonly known as: TOPROL-XL Take 1 tablet (100 mg total) by mouth daily. Take with or immediately following a meal.   multivitamin with minerals Tabs tablet Take 1 tablet by mouth daily.   pantoprazole 40 MG tablet Commonly known as: PROTONIX Take 1 tablet (40 mg total) by mouth 2 (two) times daily.   spironolactone 25 MG tablet Commonly known as: ALDACTONE 1 tablet daily.   vitamin B-1 250 MG tablet Take 250 mg by mouth daily.        Allergies: No Known Allergies  Family History: Family History  Problem Relation Age of Onset   Heart disease Father    Aneurysm Sister     Social History:  reports that he has never smoked. He has never used smokeless tobacco. He reports that he does not currently use alcohol. He reports that he does not use drugs.   Physical Exam: BP 93/63   Pulse 89   Ht 5\' 6"  (1.676 m)   Wt 144 lb (65.3 kg)   BMI 23.24 kg/m   Constitutional:  Alert and oriented, No acute distress. HEENT:  AT, moist mucus membranes.  Trachea midline, no masses. Cardiovascular: No clubbing, cyanosis, or edema. Respiratory: Normal respiratory effort, no increased work of breathing. GU: Testes descended bilaterally.  Mild left hemiscrotal enlargement without tenderness Psychiatric: Normal mood and affect.    Assessment & Plan:    1. Benign prostatic hyperplasia with LUTS Complains of a slow urinary stream which is new. Schedule cystoscopy to evaluate for stricture/bladder neck contracture PVR 0 mL  2.  Left epididymitis/UTI Could not void today and bladder volume was  0 mL Recheck urine after completing antibiotics    , MD  [facility] [address], [address] [place], [place] [phone]

## 2021-08-16 ENCOUNTER — Ambulatory Visit: Payer: Medicare HMO | Admitting: Urology

## 2021-09-19 ENCOUNTER — Other Ambulatory Visit: Payer: Medicare HMO | Admitting: Urology

## 2021-10-28 ENCOUNTER — Other Ambulatory Visit (HOSPITAL_COMMUNITY): Payer: Self-pay | Admitting: Internal Medicine

## 2021-10-28 ENCOUNTER — Other Ambulatory Visit: Payer: Self-pay | Admitting: Internal Medicine

## 2021-10-28 DIAGNOSIS — K7010 Alcoholic hepatitis without ascites: Secondary | ICD-10-CM

## 2021-10-28 DIAGNOSIS — F1029 Alcohol dependence with unspecified alcohol-induced disorder: Secondary | ICD-10-CM

## 2021-10-28 DIAGNOSIS — K7460 Unspecified cirrhosis of liver: Secondary | ICD-10-CM

## 2021-10-31 ENCOUNTER — Other Ambulatory Visit: Payer: Self-pay | Admitting: Internal Medicine

## 2021-10-31 DIAGNOSIS — K7010 Alcoholic hepatitis without ascites: Secondary | ICD-10-CM

## 2021-10-31 DIAGNOSIS — K7030 Alcoholic cirrhosis of liver without ascites: Secondary | ICD-10-CM

## 2021-11-01 ENCOUNTER — Other Ambulatory Visit: Payer: Self-pay | Admitting: Internal Medicine

## 2021-11-01 DIAGNOSIS — K7460 Unspecified cirrhosis of liver: Secondary | ICD-10-CM

## 2021-11-01 DIAGNOSIS — K7010 Alcoholic hepatitis without ascites: Secondary | ICD-10-CM

## 2021-11-01 DIAGNOSIS — F1020 Alcohol dependence, uncomplicated: Secondary | ICD-10-CM

## 2021-11-06 ENCOUNTER — Other Ambulatory Visit: Payer: Medicare HMO

## 2021-11-08 ENCOUNTER — Other Ambulatory Visit: Payer: Medicare HMO

## 2021-11-08 ENCOUNTER — Ambulatory Visit
Admission: RE | Admit: 2021-11-08 | Discharge: 2021-11-08 | Disposition: A | Discharge status: Home / Self Care | Payer: Medicare HMO | Source: Ambulatory Visit | Attending: Internal Medicine | Admitting: Internal Medicine

## 2021-11-08 DIAGNOSIS — K7010 Alcoholic hepatitis without ascites: Secondary | ICD-10-CM

## 2021-11-08 DIAGNOSIS — K7030 Alcoholic cirrhosis of liver without ascites: Secondary | ICD-10-CM

## 2022-01-08 ENCOUNTER — Ambulatory Visit: Payer: Medicare HMO | Admitting: Urology

## 2022-01-08 ENCOUNTER — Encounter: Payer: Self-pay | Admitting: Urology

## 2022-02-15 IMAGING — CR DG ABDOMEN 1V
2 series · 2 of 2 positions shown · non-contrast
Comparison: CT 06/25/2019.  Abdomen 03/28/2019.

CLINICAL DATA: History of kidney stones.

EXAM:
ABDOMEN - 1 VIEW

[abdomen kub (1 of 2)]
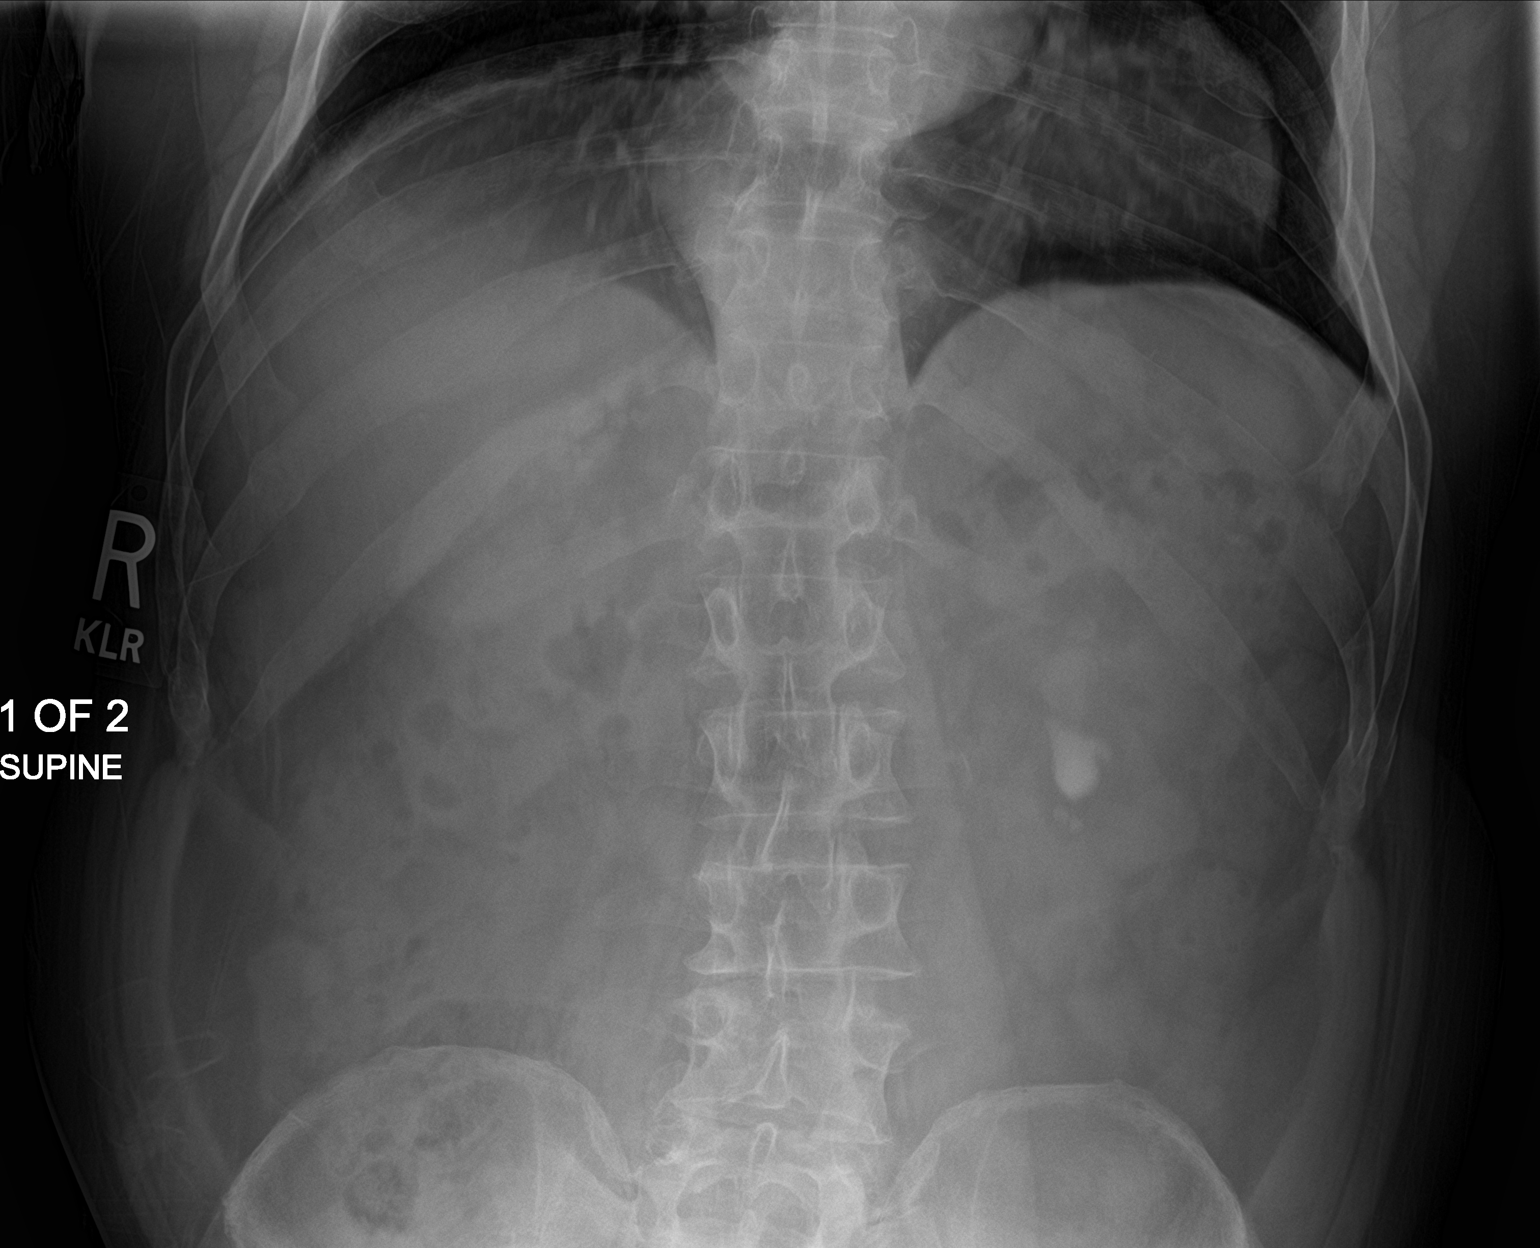

[abdomen kub (2 of 2)]
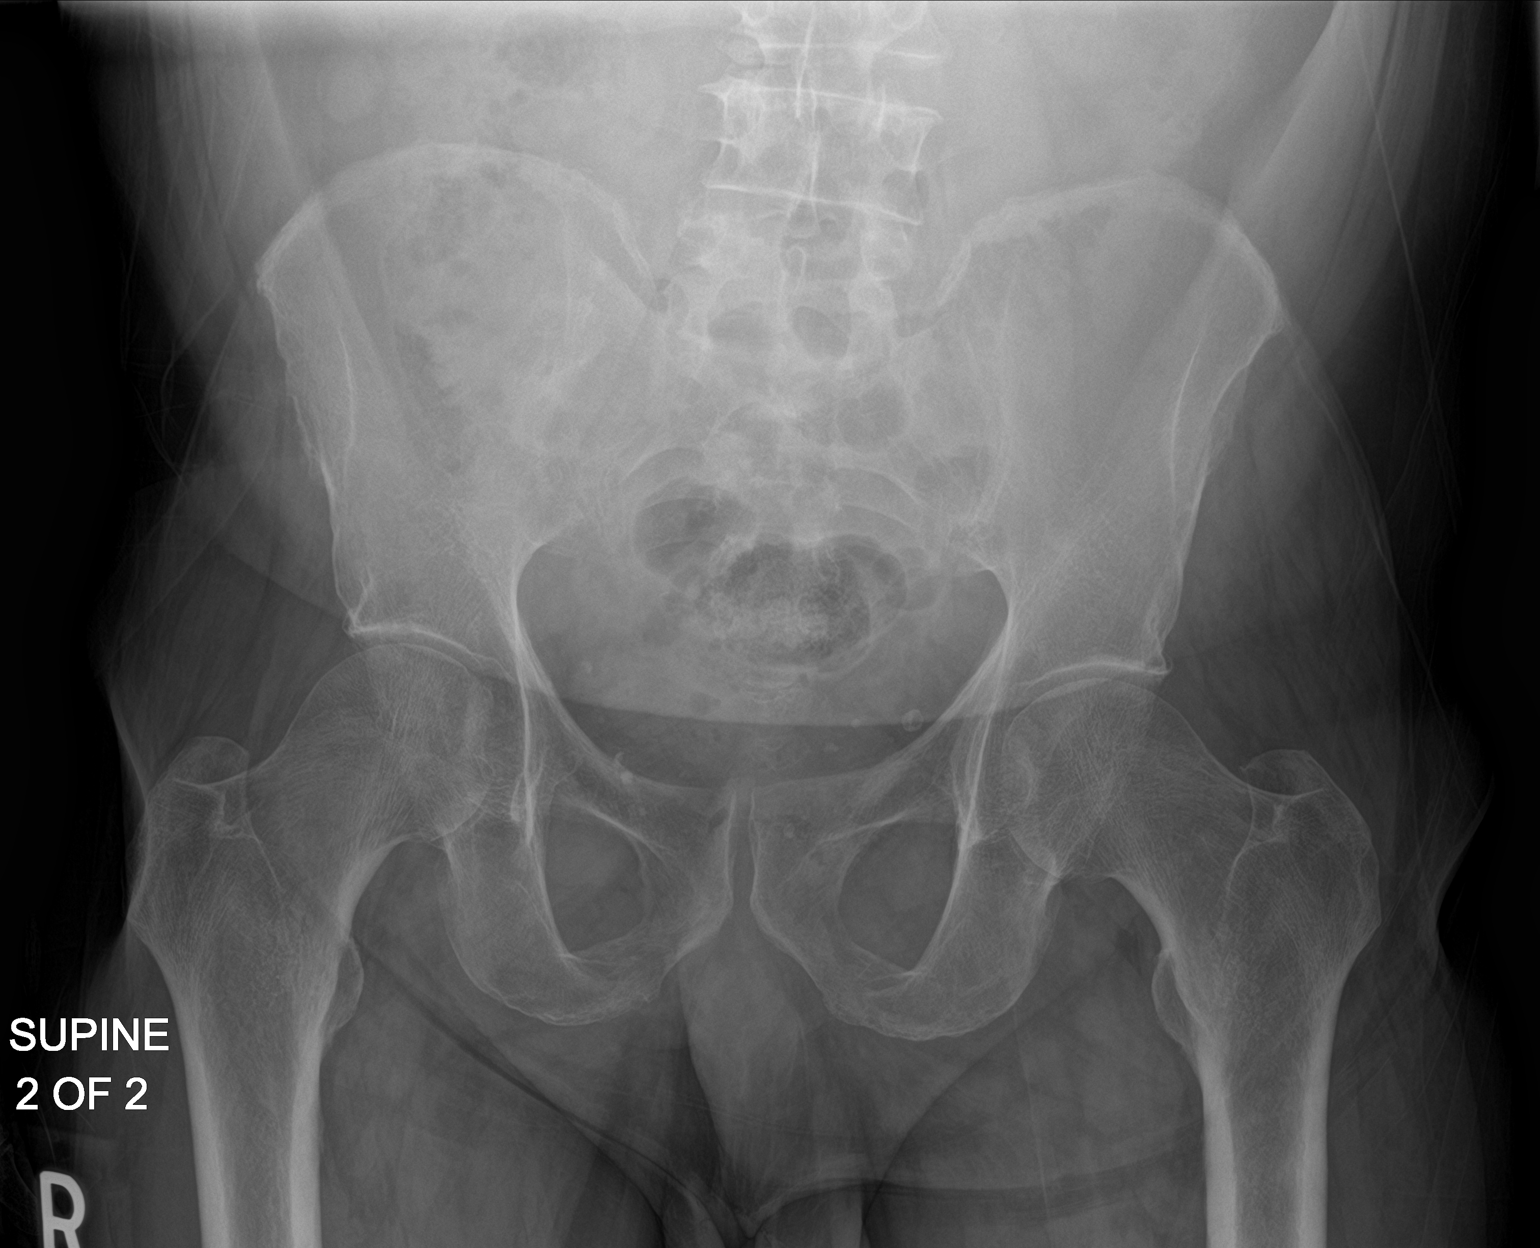

[2 of 2 positions shown; findings below may reference images not displayed]

FINDINGS: Prominent staghorn calculus with adjacent smaller calculi again
noted in the left kidney. No interim change. Small pelvic
calcifications again noted. Although these most likely represent
phleboliths, distal ureteral stones cannot be excluded. Gallstones
again noted. No bowel distention or free air. Degenerative change
lumbar spine and both hips.
IMPRESSION: 1. Prominent staghorn calculus with adjacent smaller calculi again
noted in the left kidney. No interim change. Small pelvic
calcifications again noted. Although these most likely represent
phleboliths, distal ureter stones cannot be excluded.

2.  Gallstones again noted.

## 2022-05-28 ENCOUNTER — Other Ambulatory Visit: Payer: Self-pay | Admitting: Gastroenterology

## 2022-05-28 DIAGNOSIS — R772 Abnormality of alphafetoprotein: Secondary | ICD-10-CM

## 2022-05-28 DIAGNOSIS — K7030 Alcoholic cirrhosis of liver without ascites: Secondary | ICD-10-CM

## 2022-06-06 ENCOUNTER — Ambulatory Visit: Admission: RE | Admit: 2022-06-06 | Payer: Medicare HMO | Source: Ambulatory Visit

## 2022-06-23 ENCOUNTER — Ambulatory Visit: Admission: RE | Admit: 2022-06-23 | Payer: Medicare HMO | Source: Ambulatory Visit

## 2022-08-08 ENCOUNTER — Ambulatory Visit: Admission: RE | Admit: 2022-08-08 | Payer: Medicare HMO | Source: Home / Self Care

## 2022-08-08 ENCOUNTER — Encounter: Admission: RE | Payer: Self-pay | Source: Home / Self Care

## 2022-08-08 SURGERY — COLONOSCOPY WITH PROPOFOL
Anesthesia: General

## 2023-01-22 ENCOUNTER — Encounter: Payer: Self-pay | Admitting: Emergency Medicine

## 2023-01-22 ENCOUNTER — Other Ambulatory Visit: Payer: Self-pay

## 2023-01-22 ENCOUNTER — Emergency Department: Payer: Medicare HMO

## 2023-01-22 ENCOUNTER — Emergency Department
Admission: EM | Admit: 2023-01-22 | Discharge: 2023-01-22 | Disposition: A | Discharge status: Home / Self Care | Payer: Medicare HMO | Attending: Emergency Medicine | Admitting: Emergency Medicine

## 2023-01-22 DIAGNOSIS — R1031 Right lower quadrant pain: Secondary | ICD-10-CM

## 2023-01-22 DIAGNOSIS — K4090 Unilateral inguinal hernia, without obstruction or gangrene, not specified as recurrent: Secondary | ICD-10-CM | POA: Insufficient documentation

## 2023-01-22 LAB — CBC
HCT: 27.9 % — ABNORMAL LOW (ref 39.0–52.0)
Hemoglobin: 9.4 g/dL — ABNORMAL LOW (ref 13.0–17.0)
MCH: 44.8 pg — ABNORMAL HIGH (ref 26.0–34.0)
MCHC: 33.7 g/dL (ref 30.0–36.0)
MCV: 132.9 fL — ABNORMAL HIGH (ref 80.0–100.0)
Platelets: 107 10*3/uL — ABNORMAL LOW (ref 150–400)
RBC: 2.1 MIL/uL — ABNORMAL LOW (ref 4.22–5.81)
RDW: 13 % (ref 11.5–15.5)
WBC: 3.8 10*3/uL — ABNORMAL LOW (ref 4.0–10.5)
nRBC: 0 % (ref 0.0–0.2)

## 2023-01-22 LAB — COMPREHENSIVE METABOLIC PANEL
ALT: 17 U/L (ref 0–44)
AST: 48 U/L — ABNORMAL HIGH (ref 15–41)
Albumin: 2.7 g/dL — ABNORMAL LOW (ref 3.5–5.0)
Alkaline Phosphatase: 86 U/L (ref 38–126)
Anion gap: 5 (ref 5–15)
BUN: 13 mg/dL (ref 8–23)
CO2: 21 mmol/L — ABNORMAL LOW (ref 22–32)
Calcium: 8.3 mg/dL — ABNORMAL LOW (ref 8.9–10.3)
Chloride: 109 mmol/L (ref 98–111)
Creatinine, Ser: 1.43 mg/dL — ABNORMAL HIGH (ref 0.61–1.24)
GFR, Estimated: 51 mL/min — ABNORMAL LOW (ref >60)
Glucose, Bld: 117 mg/dL — ABNORMAL HIGH (ref 70–99)
Potassium: 4.1 mmol/L (ref 3.5–5.1)
Sodium: 135 mmol/L (ref 135–145)
Total Bilirubin: 1.9 mg/dL — ABNORMAL HIGH (ref <1.2)
Total Protein: 6.8 g/dL (ref 6.5–8.1)

## 2023-01-22 LAB — LIPASE, BLOOD: Lipase: 41 U/L (ref 11–51)

## 2023-01-22 MED ORDER — IOHEXOL 300 MG/ML  SOLN
100.0000 mL | Freq: Once | INTRAMUSCULAR | Status: AC | PRN
  Administered 2023-01-22: 80 mL via INTRAVENOUS

## 2023-01-22 MED ORDER — SODIUM CHLORIDE 0.9 % IV BOLUS
1000.0000 mL | Freq: Once | INTRAVENOUS | Status: AC
Start: 2023-01-22 — End: 2023-01-22
  Administered 2023-01-22: 1000 mL via INTRAVENOUS

## 2023-01-22 NOTE — ED Triage Notes (Signed)
Pt via POV from home. Pt c/o lower abd pain, and bilateral feet swelling and loss of appetite. Denies NVD. Denies fever. Pt has had kidney issues in the past. Pt is A&Ox4 and NAD

## 2023-01-22 NOTE — ED Provider Notes (Signed)
[facility] Provider Note    Event Date/Time   First MD Initiated Contact with Patient 01/22/23 1339     (approximate)   History   Chief Complaint: Abdominal Pain   HPI  Marvin Ortega is a 72 y.o. male with a history of GERD, BPH, paroxysmal atrial fibrillation, kidney stones, hypertension, alcoholic cirrhosis who comes to the ED complaining of bilateral lower abdominal pain, worsening over the past week associated with loss of appetite.  No vomiting diarrhea or constipation.  He does report urine dribbling and difficulty with urinating.  No fever.  No back pain.  Also notes swelling in his right groin.          Physical Exam   Triage Vital Signs: ED Triage Vitals  Encounter Vitals Group     BP 01/22/23 1042 (!) 134/109     Systolic BP Percentile --      Diastolic BP Percentile --      Pulse Rate 01/22/23 1042 95     Resp 01/22/23 1042 20     Temp 01/22/23 1042 98.2 F (36.8 C)     Temp Source 01/22/23 1042 Oral     SpO2 01/22/23 1042 98 %     Weight 01/22/23 1040 140 lb (63.5 kg)     Height 01/22/23 1040 5\' 6"  (1.676 m)     Head Circumference --      Peak Flow --      Pain Score 01/22/23 1039 0     Pain Loc --      Pain Education --      Exclude from Growth Chart --     Most recent vital signs: Vitals:   01/22/23 1042 01/22/23 1345  BP: (!) 134/109 (!) 141/93  Pulse: 95 71  Resp: 20   Temp: 98.2 F (36.8 C)   SpO2: 98% 100%    General: Awake, no distress.  CV:  Good peripheral perfusion.  Regular rate. Resp:  Normal effort.  Abd:  No distention.  Right indirect inguinal hernia, soft but not easily reducible.  Diffuse lower abdominal tenderness. Other:  No lower extremity edema.  Symmetric calf circumference.   ED Results / Procedures / Treatments   Labs (all labs ordered are listed, but only abnormal results are displayed) Labs Reviewed  COMPREHENSIVE METABOLIC PANEL - Abnormal; Notable for the following  components:      Result Value   CO2 21 (*)    Glucose, Bld 117 (*)    Creatinine, Ser 1.43 (*)    Calcium 8.3 (*)    Albumin 2.7 (*)    AST 48 (*)    Total Bilirubin 1.9 (*)    GFR, Estimated 51 (*)    All other components within normal limits  CBC - Abnormal; Notable for the following components:   WBC 3.8 (*)    RBC 2.10 (*)    Hemoglobin 9.4 (*)    HCT 27.9 (*)    MCV 132.9 (*)    MCH 44.8 (*)    Platelets 107 (*)    All other components within normal limits  LIPASE, BLOOD  URINALYSIS, ROUTINE W REFLEX MICROSCOPIC     EKG    RADIOLOGY CT abdomen pelvis pending   PROCEDURES:  Procedures   MEDICATIONS ORDERED IN ED: Medications  sodium chloride 0.9 % bolus 1,000 mL (has no administration in time range)     IMPRESSION / MDM / ASSESSMENT AND PLAN / ED COURSE  I reviewed the  triage vital signs and the nursing notes.  DDx: Urinary retention, incarcerated inguinal hernia, constipation, cystitis, ureterolithiasis, diverticulitis, bowel obstruction, dehydration, AKI, electrolyte abnormality  Patient's presentation is most consistent with acute presentation with potential threat to life or bodily function.  Patient presents with decreased urine output over the past week and right inguinal hernia.  Will obtain CT to further evaluate.  Serum labs show slight increase in creatinine from baseline, stable chronic anemia.  LFTs and lipase unremarkable.       FINAL CLINICAL IMPRESSION(S) / ED DIAGNOSES   Final diagnoses:  Inguinal hernia, right     Rx / DC Orders   ED Discharge Orders     None        Note:  This document was prepared using Dragon voice recognition software and may include unintentional dictation errors.   Sharman Cheek, MD 01/22/23 1414

## 2023-01-22 NOTE — ED Provider Notes (Signed)
Emergency department handoff note  Care of this patient was signed out to me at the end of the previous provider shift.  All pertinent patient information was conveyed and all questions were answered.  Patient pending CT of the abdomen and pelvis that showed incidental findings including a staghorn calculi in the left renal collecting system as well as a right indirect inguinal hernia without any evidence of incarceration/strangulation of the bowel.  The patient has been reexamined and is ready to be discharged.  All diagnostic results have been reviewed and discussed with the patient/family.  Care plan has been outlined and the patient/family understands all current diagnoses, results, and treatment plans.  There are no new complaints, changes, or physical findings at this time.  All questions have been addressed and answered.  Patient was instructed to, and agrees to follow-up with their primary care physician as well as return to the emergency department if any new or worsening symptoms develop.   Merwyn Katos, MD 01/22/23 [phone]

## 2023-02-24 ENCOUNTER — Other Ambulatory Visit: Payer: Self-pay | Admitting: Urology

## 2023-02-24 DIAGNOSIS — N200 Calculus of kidney: Secondary | ICD-10-CM

## 2023-02-24 NOTE — Progress Notes (Deleted)
02/26/2023 10:17 PM   Marvin Ortega 1950-12-24 [id number]  Referring provider: Louis Matte, MD [address] [place],  [place] [address]  Urological history: 1. Nephrolithiasis -Ureteroscopic stone removal 11/2018 -Asymptomatic left lower pole partial staghorn calculus   2.  History bladder calculus -Cystolitholapaxy 11/2018 -Declined outlet procedure   3.  BPH with urinary retention -TURP 05/2019  No chief complaint on file.  HPI: Marvin Ortega is a 72 y.o. male who presents today for calculus.    Previous records reviewed.   He was last seen in our office by Dr. Lonna Cobb in 08/2021 after being admitted for epididymitis.  He was complaining of a slow urinary stream and was scheduled for a cystoscopy to evaluate for BOO or stricture, but he did not show for the appointment.   He also did not show for his one year follow up in 01/2022.    He was seen in the ED in November 2024 for bilateral lower abdominal pain worsening over the past week associated with loss of appetite.   He was diagnosed with a right inguinal hernia and instructed to follow up with his PCP.    There is a staghorn calculus located in his left lower pole.      PMH: Past Medical History:  Diagnosis Date   Alcoholic cirrhosis of liver (HCC)    Anemia    Atrial fibrillation (HCC)    Atrial fibrillation (HCC)    BPH (benign prostatic hyperplasia)    COPD (chronic obstructive pulmonary disease) (HCC)    Dieulafoy lesion of stomach    GERD (gastroesophageal reflux disease)    h/o   GIB (gastrointestinal bleeding) 02/15/2018   Hemorrhagic shock (HCC)    History of kidney stones    Hypertension    Pre-diabetes    Sepsis (HCC)    Upper GI bleed     Surgical History: Past Surgical History:  Procedure Laterality Date   CYSTOSCOPY W/ RETROGRADES Right 11/23/2018   Procedure: CYSTOSCOPY WITH RETROGRADE PYELOGRAM;  Surgeon: Riki Altes, MD;  Location: [facility];  Service: Urology;   Laterality: Right;   CYSTOSCOPY WITH HOLMIUM LASER LITHOTRIPSY  11/30/2018   Procedure: CYSTOSCOPY WITH HOLMIUM LASER LITHOTRIPSY;  Surgeon: Riki Altes, MD;  Location: [facility];  Service: Urology;;   CYSTOSCOPY WITH LITHOLAPAXY N/A 11/23/2018   Procedure: CYSTOSCOPY WITH LITHOLAPAXY;  Surgeon: Riki Altes, MD;  Location: [facility];  Service: Urology;  Laterality: N/A;   CYSTOSCOPY WITH LITHOLAPAXY N/A 11/30/2018   Procedure: CYSTOSCOPY WITH LITHOLAPAXY;  Surgeon: Riki Altes, MD;  Location: [facility];  Service: Urology;  Laterality: N/A;   CYSTOSCOPY/URETEROSCOPY/HOLMIUM LASER/STENT PLACEMENT Right 11/23/2018   Procedure: CYSTOSCOPY/URETEROSCOPY/HOLMIUM LASER/STENT PLACEMENT;  Surgeon: Riki Altes, MD;  Location: [facility];  Service: Urology;  Laterality: Right;   ESOPHAGOGASTRODUODENOSCOPY N/A 02/15/2018   Procedure: ESOPHAGOGASTRODUODENOSCOPY (EGD);  Surgeon: Toney Reil, MD;  Location: [facility];  Service: Gastroenterology;  Laterality: N/A;   TRANSURETHRAL RESECTION OF PROSTATE N/A 05/24/2019   Procedure: TRANSURETHRAL RESECTION OF THE PROSTATE (TURP);  Surgeon: Riki Altes, MD;  Location: [facility];  Service: Urology;  Laterality: N/A;    Home Medications:  Allergies as of 02/26/2023   No Known Allergies      Medication List        Accurate as of February 24, 2023 10:17 PM. If you have any questions, ask your nurse or doctor.          albuterol 108 (90 Base) MCG/ACT  inhaler Commonly known as: VENTOLIN HFA Inhale 2 puffs into the lungs every 6 (six) hours as needed for wheezing or shortness of breath.   aspirin EC 81 MG tablet Take 81 mg by mouth daily.   atorvastatin 20 MG tablet Commonly known as: LIPITOR Take 1 tablet by mouth daily.   Breztri Aerosphere 160-9-4.8 MCG/ACT Aero Generic drug: Budeson-Glycopyrrol-Formoterol Inhale 2 puffs into the lungs in the morning and at bedtime.   budesonide-formoterol 160-4.5 MCG/ACT  inhaler Commonly known as: SYMBICORT Inhale 2 puffs into the lungs 2 (two) times daily.   cyanocobalamin 1000 MCG tablet Commonly known as: VITAMIN B12 Take 1,000 mcg by mouth daily.   finasteride 5 MG tablet Commonly known as: PROSCAR Take 5 mg by mouth every morning.   folic acid 1 MG tablet Commonly known as: FOLVITE Take 1 tablet (1 mg total) by mouth daily.   loratadine 10 MG tablet Commonly known as: CLARITIN Take 10 mg by mouth daily.   metoprolol succinate 100 MG 24 hr tablet Commonly known as: TOPROL-XL Take 1 tablet (100 mg total) by mouth daily. Take with or immediately following a meal.   multivitamin with minerals Tabs tablet Take 1 tablet by mouth daily.   pantoprazole 40 MG tablet Commonly known as: PROTONIX Take 1 tablet (40 mg total) by mouth 2 (two) times daily.   spironolactone 25 MG tablet Commonly known as: ALDACTONE 1 tablet daily.   vitamin B-1 250 MG tablet Take 250 mg by mouth daily.        Allergies: No Known Allergies  Family History: Family History  Problem Relation Age of Onset   Heart disease Father    Aneurysm Sister     Social History:  reports that he has never smoked. He has never used smokeless tobacco. He reports that he does not currently use alcohol. He reports that he does not use drugs.  ROS: Pertinent ROS in HPI  Physical Exam: There were no vitals taken for this visit.  Constitutional:  Well nourished. Alert and oriented, No acute distress. HEENT: [place] AT, moist mucus membranes.  Trachea midline, no masses. Cardiovascular: No clubbing, cyanosis, or edema. Respiratory: Normal respiratory effort, no increased work of breathing. GI: Abdomen is soft, non tender, non distended, no abdominal masses. Liver and spleen not palpable.  No hernias appreciated.  Stool sample for occult testing is not indicated.   GU: No CVA tenderness.  No bladder fullness or masses.  Patient with circumcised/uncircumcised phallus. ***Foreskin  easily retracted***  Urethral meatus is patent.  No penile discharge. No penile lesions or rashes. Scrotum without lesions, cysts, rashes and/or edema.  Testicles are located scrotally bilaterally. No masses are appreciated in the testicles. Left and right epididymis are normal. Rectal: Patient with  normal sphincter tone. Anus and perineum without scarring or rashes. No rectal masses are appreciated. Prostate is approximately *** grams, *** nodules are appreciated. Seminal vesicles are normal. Skin: No rashes, bruises or suspicious lesions. Lymph: No cervical or inguinal adenopathy. Neurologic: Grossly intact, no focal deficits, moving all 4 extremities. Psychiatric: Normal mood and affect.  Laboratory Data: Lab Results  Component Value Date   WBC 3.8 (L) 01/22/2023   HGB 9.4 (L) 01/22/2023   HCT 27.9 (L) 01/22/2023   MCV 132.9 (H) 01/22/2023   PLT 107 (L) 01/22/2023    Lab Results  Component Value Date   CREATININE 1.43 (H) 01/22/2023    Lab Results  Component Value Date   PSA 1.8 06/20/2010    No  results found for: "TESTOSTERONE"  Lab Results  Component Value Date   HGBA1C 6.0 (H) 07/20/2021    Lab Results  Component Value Date   TSH 1.40 06/02/2013       Component Value Date/Time   CHOL 129 08/17/2012 0000   HDL 91 (A) 08/17/2012 0000   CHOLHDL 1.9 07/20/2009 1027   VLDL 18 07/20/2009 1027   LDLCALC 24 08/17/2012 0000    Lab Results  Component Value Date   AST 48 (H) 01/22/2023   Lab Results  Component Value Date   ALT 17 01/22/2023   No components found for: "ALKALINEPHOPHATASE" No components found for: "BILIRUBINTOTAL"  No results found for: "ESTRADIOL"  Urinalysis See EPIC and HPI  I have reviewed the labs.   Pertinent Imaging: @CT @ @ultrasound @ @KUB @ I have independently reviewed the films.    Assessment & Plan:  ***  There are no diagnoses linked to this encounter.  No follow-ups on file.  These notes generated with voice  recognition software. I apologize for typographical errors.  Cloretta Ned  [facility] [address]  [address] [place], [place] [address] [phone]

## 2023-02-26 ENCOUNTER — Ambulatory Visit: Payer: Medicare HMO | Admitting: Urology

## 2023-03-17 NOTE — Progress Notes (Deleted)
 03/18/2023 1:13 PM   Marvin Ortega 03-Apr-1950 [id number]  Referring provider: Sampson Ethridge LABOR, MD [address] [place],  [place] [address]  Urological history: 1. Nephrolithiasis -Ureteroscopic stone removal 11/2018 -Asymptomatic left lower pole partial staghorn calculus   2.  History bladder calculus -Cystolitholapaxy 11/2018 -Declined outlet procedure   3.  BPH with urinary retention -TURP 05/2019  No chief complaint on file.  HPI: Marvin Ortega is a 73 y.o. male who presents today for calculus.    Previous records reviewed.   He was last seen in our office by Dr. Twylla in 08/2021 after being admitted for epididymitis.  He was complaining of a slow urinary stream and was scheduled for a cystoscopy to evaluate for BOO or stricture, but he did not show for the appointment.   He also did not show for his one year follow up in 01/2022.    He was seen in the ED in November 2024 for bilateral lower abdominal pain worsening over the past week associated with loss of appetite.   He was diagnosed with a right inguinal hernia and instructed to follow up with his PCP.    There is a staghorn calculus located in his left lower pole.    KUB ***  UA ***  I PSS ***  PVR ***    Score:  1-7 Mild 8-19 Moderate 20-35 Severe     PMH: Past Medical History:  Diagnosis Date   Alcoholic cirrhosis of liver (HCC)    Anemia    Atrial fibrillation (HCC)    Atrial fibrillation (HCC)    BPH (benign prostatic hyperplasia)    COPD (chronic obstructive pulmonary disease) (HCC)    Dieulafoy lesion of stomach    GERD (gastroesophageal reflux disease)    h/o   GIB (gastrointestinal bleeding) 02/15/2018   Hemorrhagic shock (HCC)    History of kidney stones    Hypertension    Pre-diabetes    Sepsis (HCC)    Upper GI bleed     Surgical History: Past Surgical History:  Procedure Laterality Date   CYSTOSCOPY W/ RETROGRADES Right 11/23/2018   Procedure: CYSTOSCOPY  WITH RETROGRADE PYELOGRAM;  Surgeon: Twylla Glendia BROCKS, MD;  Location: [facility];  Service: Urology;  Laterality: Right;   CYSTOSCOPY WITH HOLMIUM LASER LITHOTRIPSY  11/30/2018   Procedure: CYSTOSCOPY WITH HOLMIUM LASER LITHOTRIPSY;  Surgeon: Twylla Glendia BROCKS, MD;  Location: [facility];  Service: Urology;;   CYSTOSCOPY WITH LITHOLAPAXY N/A 11/23/2018   Procedure: CYSTOSCOPY WITH LITHOLAPAXY;  Surgeon: Twylla Glendia BROCKS, MD;  Location: [facility];  Service: Urology;  Laterality: N/A;   CYSTOSCOPY WITH LITHOLAPAXY N/A 11/30/2018   Procedure: CYSTOSCOPY WITH LITHOLAPAXY;  Surgeon: Twylla Glendia BROCKS, MD;  Location: [facility];  Service: Urology;  Laterality: N/A;   CYSTOSCOPY/URETEROSCOPY/HOLMIUM LASER/STENT PLACEMENT Right 11/23/2018   Procedure: CYSTOSCOPY/URETEROSCOPY/HOLMIUM LASER/STENT PLACEMENT;  Surgeon: Twylla Glendia BROCKS, MD;  Location: [facility];  Service: Urology;  Laterality: Right;   ESOPHAGOGASTRODUODENOSCOPY N/A 02/15/2018   Procedure: ESOPHAGOGASTRODUODENOSCOPY (EGD);  Surgeon: Unk Corinn Skiff, MD;  Location: [facility];  Service: Gastroenterology;  Laterality: N/A;   TRANSURETHRAL RESECTION OF PROSTATE N/A 05/24/2019   Procedure: TRANSURETHRAL RESECTION OF THE PROSTATE (TURP);  Surgeon: Twylla Glendia BROCKS, MD;  Location: [facility];  Service: Urology;  Laterality: N/A;    Home Medications:  Allergies as of 03/18/2023   No Known Allergies      Medication List        Accurate as of March 17, 2023  1:13 PM. If you have any questions, ask your nurse or doctor.          albuterol  108 (90 Base) MCG/ACT inhaler Commonly known as: VENTOLIN  HFA Inhale 2 puffs into the lungs every 6 (six) hours as needed for wheezing or shortness of breath.   aspirin  EC 81 MG tablet Take 81 mg by mouth daily.   atorvastatin  20 MG tablet Commonly known as: LIPITOR Take 1 tablet by mouth daily.   Breztri  Aerosphere 160-9-4.8 MCG/ACT Aero Generic drug: Budeson-Glycopyrrol-Formoterol  Inhale 2 puffs into  the lungs in the morning and at bedtime.   budesonide-formoterol  160-4.5 MCG/ACT inhaler Commonly known as: SYMBICORT Inhale 2 puffs into the lungs 2 (two) times daily.   cyanocobalamin  1000 MCG tablet Commonly known as: VITAMIN B12 Take 1,000 mcg by mouth daily.   finasteride  5 MG tablet Commonly known as: PROSCAR  Take 5 mg by mouth every morning.   folic acid  1 MG tablet Commonly known as: FOLVITE  Take 1 tablet (1 mg total) by mouth daily.   loratadine  10 MG tablet Commonly known as: CLARITIN  Take 10 mg by mouth daily.   metoprolol  succinate 100 MG 24 hr tablet Commonly known as: TOPROL -XL Take 1 tablet (100 mg total) by mouth daily. Take with or immediately following a meal.   multivitamin with minerals Tabs tablet Take 1 tablet by mouth daily.   pantoprazole  40 MG tablet Commonly known as: PROTONIX  Take 1 tablet (40 mg total) by mouth 2 (two) times daily.   spironolactone  25 MG tablet Commonly known as: ALDACTONE  1 tablet daily.   vitamin B-1 250 MG tablet Take 250 mg by mouth daily.        Allergies: No Known Allergies  Family History: Family History  Problem Relation Age of Onset   Heart disease Father    Aneurysm Sister     Social History:  reports that he has never smoked. He has never used smokeless tobacco. He reports that he does not currently use alcohol. He reports that he does not use drugs.  ROS: Pertinent ROS in HPI  Physical Exam: There were no vitals taken for this visit.  Constitutional:  Well nourished. Alert and oriented, No acute distress. HEENT: [place] AT, moist mucus membranes.  Trachea midline, no masses. Cardiovascular: No clubbing, cyanosis, or edema. Respiratory: Normal respiratory effort, no increased work of breathing. GI: Abdomen is soft, non tender, non distended, no abdominal masses. Liver and spleen not palpable.  No hernias appreciated.  Stool sample for occult testing is not indicated.   GU: No CVA tenderness.  No  bladder fullness or masses.  Patient with circumcised/uncircumcised phallus. ***Foreskin easily retracted***  Urethral meatus is patent.  No penile discharge. No penile lesions or rashes. Scrotum without lesions, cysts, rashes and/or edema.  Testicles are located scrotally bilaterally. No masses are appreciated in the testicles. Left and right epididymis are normal. Rectal: Patient with  normal sphincter tone. Anus and perineum without scarring or rashes. No rectal masses are appreciated. Prostate is approximately *** grams, *** nodules are appreciated. Seminal vesicles are normal. Skin: No rashes, bruises or suspicious lesions. Lymph: No cervical or inguinal adenopathy. Neurologic: Grossly intact, no focal deficits, moving all 4 extremities. Psychiatric: Normal mood and affect.  Laboratory Data: Lab Results  Component Value Date   WBC 3.8 (L) 01/22/2023   HGB 9.4 (L) 01/22/2023   HCT 27.9 (L) 01/22/2023   MCV 132.9 (H) 01/22/2023   PLT 107 (L) 01/22/2023    Lab Results  Component  Value Date   CREATININE 1.43 (H) 01/22/2023    Lab Results  Component Value Date   AST 48 (H) 01/22/2023   Lab Results  Component Value Date   ALT 17 01/22/2023   Urinalysis See EPIC and HPI  I have reviewed the labs.   Pertinent Imaging: Narrative & Impression  CLINICAL DATA:  Lower abdominal pain.   EXAM: CT ABDOMEN AND PELVIS WITH CONTRAST   TECHNIQUE: Multidetector CT imaging of the abdomen and pelvis was performed using the standard protocol following bolus administration of intravenous contrast.   RADIATION DOSE REDUCTION: This exam was performed according to the departmental dose-optimization program which includes automated exposure control, adjustment of the mA and/or kV according to patient size and/or use of iterative reconstruction technique.   CONTRAST:  80mL OMNIPAQUE  IOHEXOL  300 MG/ML  SOLN   COMPARISON:  Jul 20, 2021.   FINDINGS: Lower chest: Small sliding-type hiatal  hernia is noted. 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   Hepatobiliary: Cholelithiasis. No biliary dilatation. Findings consistent with hepatic cirrhosis.   Pancreas: Unremarkable. No pancreatic ductal dilatation or surrounding inflammatory changes.   Spleen: Normal in size without focal abnormality.   Adrenals/Urinary Tract: Adrenal glands appear normal. Left renal cyst is noted for which no further follow-up is required. 2.0 x 1.3 cm staghorn type calculus is noted in lower pole collecting system of left kidney. No hydronephrosis or renal obstruction is noted. Urinary bladder is unremarkable.   Stomach/Bowel: Stomach is unremarkable. There is no evidence of bowel obstruction or inflammation. Appendix is unremarkable.   Vascular/Lymphatic: Aortic atherosclerosis. No enlarged abdominal or pelvic lymph nodes.   Reproductive: Status post prostatic surgery.   Other: Moderate sized right inguinal hernia is noted containing fat and fluid. Mild ascites is noted in the pelvis and around the liver and spleen.   Musculoskeletal: No acute or significant osseous findings.   IMPRESSION: Hepatic cirrhosis with mild ascites. 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   Cholelithiasis.   2 cm staghorn type calculus seen in lower pole collecting system of left kidney.   Moderate size right inguinal hernia containing fat and fluid.   Aortic Atherosclerosis (ICD10-I70.0).     Electronically Signed   By: Lynwood Landy Raddle M.D.   On: 01/22/2023 18:21  SD > 1500 HU  KUB ***, radiology interpretation still pending.   I have independently reviewed the films.    Assessment & Plan:  ***  1. Left staghorn stone -repeat BMP -discussed that the size and the location of the stone would require a PCNL to treat definitively  2.  BPH with LUTS -UA benign *** -PVR < 300 cc *** -symptoms - *** -most bothersome symptoms are *** -continue conservative management, avoiding bladder irritants and  timed voiding's -Initiate alpha-blocker (***), discussed side effects *** -Initiate 5 alpha reductase inhibitor (***), discussed side effects *** -Continue tamsulosin  0.4 mg daily, alfuzosin 10 mg daily, Rapaflo 8 mg daily, terazosin, doxazosin, Cialis 5 mg daily and finasteride  5 mg daily, dutasteride 0.5 mg daily***:refills given -Cannot tolerate medication or medication failure, schedule cystoscopy ***      No follow-ups on file.  These notes generated with voice recognition software. I apologize for typographical errors.  CLOTILDA HELON RIGGERS  [facility] [address]  [address] [place], [place] [address] [phone]

## 2023-03-18 ENCOUNTER — Ambulatory Visit: Payer: Medicare HMO | Admitting: Urology

## 2023-03-23 NOTE — Progress Notes (Deleted)
03/26/2023 4:21 PM   Marvin Ortega 05/10/1950 [id number]  Referring provider: Louis Matte, MD [address] [place],  [place] [address]  Urological history: 1. Nephrolithiasis -Ureteroscopic stone removal 11/2018 -Asymptomatic left lower pole partial staghorn calculus   2.  History bladder calculus -Cystolitholapaxy 11/2018 -Declined outlet procedure   3.  BPH with urinary retention -TURP 05/2019  No chief complaint on file.  HPI: Marvin Ortega is a 73 y.o. male who presents today for calculus.    Previous records reviewed.   He was last seen in our office by Dr. Lonna Cobb in 08/2021 after being admitted for epididymitis.  He was complaining of a slow urinary stream and was scheduled for a cystoscopy to evaluate for BOO or stricture, but he did not show for the appointment.   He also did not show for his one year follow up in 01/2022.    He was seen in the ED in November 2024 for bilateral lower abdominal pain worsening over the past week associated with loss of appetite.   He was diagnosed with a right inguinal hernia and instructed to follow up with his PCP.    There is a staghorn calculus located in his left lower pole.    KUB ***  UA ***  I PSS ***  PVR ***    Score:  1-7 Mild 8-19 Moderate 20-35 Severe     PMH: Past Medical History:  Diagnosis Date   Alcoholic cirrhosis of liver (HCC)    Anemia    Atrial fibrillation (HCC)    Atrial fibrillation (HCC)    BPH (benign prostatic hyperplasia)    COPD (chronic obstructive pulmonary disease) (HCC)    Dieulafoy lesion of stomach    GERD (gastroesophageal reflux disease)    h/o   GIB (gastrointestinal bleeding) 02/15/2018   Hemorrhagic shock (HCC)    History of kidney stones    Hypertension    Pre-diabetes    Sepsis (HCC)    Upper GI bleed     Surgical History: Past Surgical History:  Procedure Laterality Date   CYSTOSCOPY W/ RETROGRADES Right 11/23/2018   Procedure: CYSTOSCOPY  WITH RETROGRADE PYELOGRAM;  Surgeon: Riki Altes, MD;  Location: [facility];  Service: Urology;  Laterality: Right;   CYSTOSCOPY WITH HOLMIUM LASER LITHOTRIPSY  11/30/2018   Procedure: CYSTOSCOPY WITH HOLMIUM LASER LITHOTRIPSY;  Surgeon: Riki Altes, MD;  Location: [facility];  Service: Urology;;   CYSTOSCOPY WITH LITHOLAPAXY N/A 11/23/2018   Procedure: CYSTOSCOPY WITH LITHOLAPAXY;  Surgeon: Riki Altes, MD;  Location: [facility];  Service: Urology;  Laterality: N/A;   CYSTOSCOPY WITH LITHOLAPAXY N/A 11/30/2018   Procedure: CYSTOSCOPY WITH LITHOLAPAXY;  Surgeon: Riki Altes, MD;  Location: [facility];  Service: Urology;  Laterality: N/A;   CYSTOSCOPY/URETEROSCOPY/HOLMIUM LASER/STENT PLACEMENT Right 11/23/2018   Procedure: CYSTOSCOPY/URETEROSCOPY/HOLMIUM LASER/STENT PLACEMENT;  Surgeon: Riki Altes, MD;  Location: [facility];  Service: Urology;  Laterality: Right;   ESOPHAGOGASTRODUODENOSCOPY N/A 02/15/2018   Procedure: ESOPHAGOGASTRODUODENOSCOPY (EGD);  Surgeon: Toney Reil, MD;  Location: [facility];  Service: Gastroenterology;  Laterality: N/A;   TRANSURETHRAL RESECTION OF PROSTATE N/A 05/24/2019   Procedure: TRANSURETHRAL RESECTION OF THE PROSTATE (TURP);  Surgeon: Riki Altes, MD;  Location: [facility];  Service: Urology;  Laterality: N/A;    Home Medications:  Allergies as of 03/26/2023   No Known Allergies      Medication List        Accurate as of March 23, 2023  4:21 PM. If you have any questions, ask your nurse or doctor.          albuterol 108 (90 Base) MCG/ACT inhaler Commonly known as: VENTOLIN HFA Inhale 2 puffs into the lungs every 6 (six) hours as needed for wheezing or shortness of breath.   aspirin EC 81 MG tablet Take 81 mg by mouth daily.   atorvastatin 20 MG tablet Commonly known as: LIPITOR Take 1 tablet by mouth daily.   Breztri Aerosphere 160-9-4.8 MCG/ACT Aero Generic drug: Budeson-Glycopyrrol-Formoterol Inhale 2 puffs into  the lungs in the morning and at bedtime.   budesonide-formoterol 160-4.5 MCG/ACT inhaler Commonly known as: SYMBICORT Inhale 2 puffs into the lungs 2 (two) times daily.   cyanocobalamin 1000 MCG tablet Commonly known as: VITAMIN B12 Take 1,000 mcg by mouth daily.   finasteride 5 MG tablet Commonly known as: PROSCAR Take 5 mg by mouth every morning.   folic acid 1 MG tablet Commonly known as: FOLVITE Take 1 tablet (1 mg total) by mouth daily.   loratadine 10 MG tablet Commonly known as: CLARITIN Take 10 mg by mouth daily.   metoprolol succinate 100 MG 24 hr tablet Commonly known as: TOPROL-XL Take 1 tablet (100 mg total) by mouth daily. Take with or immediately following a meal.   multivitamin with minerals Tabs tablet Take 1 tablet by mouth daily.   pantoprazole 40 MG tablet Commonly known as: PROTONIX Take 1 tablet (40 mg total) by mouth 2 (two) times daily.   spironolactone 25 MG tablet Commonly known as: ALDACTONE 1 tablet daily.   vitamin B-1 250 MG tablet Take 250 mg by mouth daily.        Allergies: No Known Allergies  Family History: Family History  Problem Relation Age of Onset   Heart disease Father    Aneurysm Sister     Social History:  reports that he has never smoked. He has never used smokeless tobacco. He reports that he does not currently use alcohol. He reports that he does not use drugs.  ROS: Pertinent ROS in HPI  Physical Exam: There were no vitals taken for this visit.  Constitutional:  Well nourished. Alert and oriented, No acute distress. HEENT: [place] AT, moist mucus membranes.  Trachea midline, no masses. Cardiovascular: No clubbing, cyanosis, or edema. Respiratory: Normal respiratory effort, no increased work of breathing. GI: Abdomen is soft, non tender, non distended, no abdominal masses. Liver and spleen not palpable.  No hernias appreciated.  Stool sample for occult testing is not indicated.   GU: No CVA tenderness.  No  bladder fullness or masses.  Patient with circumcised/uncircumcised phallus. ***Foreskin easily retracted***  Urethral meatus is patent.  No penile discharge. No penile lesions or rashes. Scrotum without lesions, cysts, rashes and/or edema.  Testicles are located scrotally bilaterally. No masses are appreciated in the testicles. Left and right epididymis are normal. Rectal: Patient with  normal sphincter tone. Anus and perineum without scarring or rashes. No rectal masses are appreciated. Prostate is approximately *** grams, *** nodules are appreciated. Seminal vesicles are normal. Skin: No rashes, bruises or suspicious lesions. Lymph: No cervical or inguinal adenopathy. Neurologic: Grossly intact, no focal deficits, moving all 4 extremities. Psychiatric: Normal mood and affect.  Laboratory Data: Lab Results  Component Value Date   WBC 3.8 (L) 01/22/2023   HGB 9.4 (L) 01/22/2023   HCT 27.9 (L) 01/22/2023   MCV 132.9 (H) 01/22/2023   PLT 107 (L) 01/22/2023    Lab Results  Component  Value Date   CREATININE 1.43 (H) 01/22/2023    Lab Results  Component Value Date   AST 48 (H) 01/22/2023   Lab Results  Component Value Date   ALT 17 01/22/2023   Urinalysis See EPIC and HPI  I have reviewed the labs.   Pertinent Imaging: Narrative & Impression  CLINICAL DATA:  Lower abdominal pain.   EXAM: CT ABDOMEN AND PELVIS WITH CONTRAST   TECHNIQUE: Multidetector CT imaging of the abdomen and pelvis was performed using the standard protocol following bolus administration of intravenous contrast.   RADIATION DOSE REDUCTION: This exam was performed according to the departmental dose-optimization program which includes automated exposure control, adjustment of the mA and/or kV according to patient size and/or use of iterative reconstruction technique.   CONTRAST:  80mL OMNIPAQUE IOHEXOL 300 MG/ML  SOLN   COMPARISON:  Jul 20, 2021.   FINDINGS: Lower chest: Small sliding-type hiatal  hernia is noted. 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   Hepatobiliary: Cholelithiasis. No biliary dilatation. Findings consistent with hepatic cirrhosis.   Pancreas: Unremarkable. No pancreatic ductal dilatation or surrounding inflammatory changes.   Spleen: Normal in size without focal abnormality.   Adrenals/Urinary Tract: Adrenal glands appear normal. Left renal cyst is noted for which no further follow-up is required. 2.0 x 1.3 cm staghorn type calculus is noted in lower pole collecting system of left kidney. No hydronephrosis or renal obstruction is noted. Urinary bladder is unremarkable.   Stomach/Bowel: Stomach is unremarkable. There is no evidence of bowel obstruction or inflammation. Appendix is unremarkable.   Vascular/Lymphatic: Aortic atherosclerosis. No enlarged abdominal or pelvic lymph nodes.   Reproductive: Status post prostatic surgery.   Other: Moderate sized right inguinal hernia is noted containing fat and fluid. Mild ascites is noted in the pelvis and around the liver and spleen.   Musculoskeletal: No acute or significant osseous findings.   IMPRESSION: Hepatic cirrhosis with mild ascites. 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   Cholelithiasis.   2 cm staghorn type calculus seen in lower pole collecting system of left kidney.   Moderate size right inguinal hernia containing fat and fluid.   Aortic Atherosclerosis (ICD10-I70.0).     Electronically Signed   By: Lupita Raider M.D.   On: 01/22/2023 18:21  SD > 1500 HU  KUB ***, radiology interpretation still pending.   I have independently reviewed the films.    Assessment & Plan:  ***  1. Left staghorn stone -repeat BMP -discussed that the size and the location of the stone would require a PCNL to treat definitively  2.  BPH with LUTS -UA benign *** -PVR < 300 cc *** -symptoms - *** -most bothersome symptoms are *** -continue conservative management, avoiding bladder irritants and  timed voiding's -Initiate alpha-blocker (***), discussed side effects *** -Initiate 5 alpha reductase inhibitor (***), discussed side effects *** -Continue tamsulosin 0.4 mg daily, alfuzosin 10 mg daily, Rapaflo 8 mg daily, terazosin, doxazosin, Cialis 5 mg daily and finasteride 5 mg daily, dutasteride 0.5 mg daily***:refills given -Cannot tolerate medication or medication failure, schedule cystoscopy ***  No follow-ups on file.  These notes generated with voice recognition software. I apologize for typographical errors.  Cloretta Ned  [facility] [address]  [address] [place], [place] [address] [phone]

## 2023-03-26 ENCOUNTER — Ambulatory Visit: Payer: Medicare HMO | Admitting: Urology

## 2023-04-07 NOTE — Progress Notes (Unsigned)
04/09/2023 2:25 PM   [name] 1950-06-17 [id number]  Referring provider: Louis Matte, MD [address] [place],  [place] [address]  Urological history: 1. Nephrolithiasis -Ureteroscopic stone removal 11/2018 -Asymptomatic left lower pole partial staghorn calculus   2.  History bladder calculus -Cystolitholapaxy 11/2018 -Declined outlet procedure   3.  BPH with urinary retention -TURP 05/2019  No chief complaint on file.  HPI: ALLYN BERTONI is a 73 y.o. male who presents today for calculus.    Previous records reviewed.   He was last seen in our office by Dr. Lonna Cobb in 08/2021 after being admitted for epididymitis.  He was complaining of a slow urinary stream and was scheduled for a cystoscopy to evaluate for BOO or stricture, but he did not show for the appointment.   He also did not show for his one year follow up in 01/2022.    He was seen in the ED in November 2024 for bilateral lower abdominal pain worsening over the past week associated with loss of appetite.   He was diagnosed with a right inguinal hernia and instructed to follow up with his PCP.    There is a staghorn calculus located in his left lower pole.    KUB ***  UA ***  I PSS ***  PVR ***    Score:  1-7 Mild 8-19 Moderate 20-35 Severe     PMH: Past Medical History:  Diagnosis Date   Alcoholic cirrhosis of liver (HCC)    Anemia    Atrial fibrillation (HCC)    Atrial fibrillation (HCC)    BPH (benign prostatic hyperplasia)    COPD (chronic obstructive pulmonary disease) (HCC)    Dieulafoy lesion of stomach    GERD (gastroesophageal reflux disease)    h/o   GIB (gastrointestinal bleeding) 02/15/2018   Hemorrhagic shock (HCC)    History of kidney stones    Hypertension    Pre-diabetes    Sepsis (HCC)    Upper GI bleed     Surgical History: Past Surgical History:  Procedure Laterality Date   CYSTOSCOPY W/ RETROGRADES Right 11/23/2018   Procedure: CYSTOSCOPY  WITH RETROGRADE PYELOGRAM;  Surgeon: Riki Altes, MD;  Location: [facility] ORS;  Service: Urology;  Laterality: Right;   CYSTOSCOPY WITH HOLMIUM LASER LITHOTRIPSY  11/30/2018   Procedure: CYSTOSCOPY WITH HOLMIUM LASER LITHOTRIPSY;  Surgeon: Riki Altes, MD;  Location: [facility];  Service: Urology;;   CYSTOSCOPY WITH LITHOLAPAXY N/A 11/23/2018   Procedure: CYSTOSCOPY WITH LITHOLAPAXY;  Surgeon: Riki Altes, MD;  Location: [facility];  Service: Urology;  Laterality: N/A;   CYSTOSCOPY WITH LITHOLAPAXY N/A 11/30/2018   Procedure: CYSTOSCOPY WITH LITHOLAPAXY;  Surgeon: Riki Altes, MD;  Location: [facility];  Service: Urology;  Laterality: N/A;   CYSTOSCOPY/URETEROSCOPY/HOLMIUM LASER/STENT PLACEMENT Right 11/23/2018   Procedure: CYSTOSCOPY/URETEROSCOPY/HOLMIUM LASER/STENT PLACEMENT;  Surgeon: Riki Altes, MD;  Location: [facility];  Service: Urology;  Laterality: Right;   ESOPHAGOGASTRODUODENOSCOPY N/A 02/15/2018   Procedure: ESOPHAGOGASTRODUODENOSCOPY (EGD);  Surgeon: Toney Reil, MD;  Location: [facility];  Service: Gastroenterology;  Laterality: N/A;   TRANSURETHRAL RESECTION OF PROSTATE N/A 05/24/2019   Procedure: TRANSURETHRAL RESECTION OF THE PROSTATE (TURP);  Surgeon: Riki Altes, MD;  Location: [facility];  Service: Urology;  Laterality: N/A;    Home Medications:  Allergies as of 04/09/2023   No Known Allergies      Medication List        Accurate as of April 07, 2023  2:25 PM. If you have any questions, ask your nurse or doctor.          albuterol 108 (90 Base) MCG/ACT inhaler Commonly known as: VENTOLIN HFA Inhale 2 puffs into the lungs every 6 (six) hours as needed for wheezing or shortness of breath.   aspirin EC 81 MG tablet Take 81 mg by mouth daily.   atorvastatin 20 MG tablet Commonly known as: LIPITOR Take 1 tablet by mouth daily.   Breztri Aerosphere 160-9-4.8 MCG/ACT Aero Generic drug: Budeson-Glycopyrrol-Formoterol Inhale 2 puffs into  the lungs in the morning and at bedtime.   budesonide-formoterol 160-4.5 MCG/ACT inhaler Commonly known as: SYMBICORT Inhale 2 puffs into the lungs 2 (two) times daily.   cyanocobalamin 1000 MCG tablet Commonly known as: VITAMIN B12 Take 1,000 mcg by mouth daily.   finasteride 5 MG tablet Commonly known as: PROSCAR Take 5 mg by mouth every morning.   folic acid 1 MG tablet Commonly known as: FOLVITE Take 1 tablet (1 mg total) by mouth daily.   loratadine 10 MG tablet Commonly known as: CLARITIN Take 10 mg by mouth daily.   metoprolol succinate 100 MG 24 hr tablet Commonly known as: TOPROL-XL Take 1 tablet (100 mg total) by mouth daily. Take with or immediately following a meal.   multivitamin with minerals Tabs tablet Take 1 tablet by mouth daily.   pantoprazole 40 MG tablet Commonly known as: PROTONIX Take 1 tablet (40 mg total) by mouth 2 (two) times daily.   spironolactone 25 MG tablet Commonly known as: ALDACTONE 1 tablet daily.   vitamin B-1 250 MG tablet Take 250 mg by mouth daily.        Allergies: No Known Allergies  Family History: Family History  Problem Relation Age of Onset   Heart disease Father    Aneurysm Sister     Social History:  reports that he has never smoked. He has never used smokeless tobacco. He reports that he does not currently use alcohol. He reports that he does not use drugs.  ROS: Pertinent ROS in HPI  Physical Exam: There were no vitals taken for this visit.  Constitutional:  Well nourished. Alert and oriented, No acute distress. HEENT: [place] AT, moist mucus membranes.  Trachea midline, no masses. Cardiovascular: No clubbing, cyanosis, or edema. Respiratory: Normal respiratory effort, no increased work of breathing. GI: Abdomen is soft, non tender, non distended, no abdominal masses. Liver and spleen not palpable.  No hernias appreciated.  Stool sample for occult testing is not indicated.   GU: No CVA tenderness.  No  bladder fullness or masses.  Patient with circumcised/uncircumcised phallus. ***Foreskin easily retracted***  Urethral meatus is patent.  No penile discharge. No penile lesions or rashes. Scrotum without lesions, cysts, rashes and/or edema.  Testicles are located scrotally bilaterally. No masses are appreciated in the testicles. Left and right epididymis are normal. Rectal: Patient with  normal sphincter tone. Anus and perineum without scarring or rashes. No rectal masses are appreciated. Prostate is approximately *** grams, *** nodules are appreciated. Seminal vesicles are normal. Skin: No rashes, bruises or suspicious lesions. Lymph: No cervical or inguinal adenopathy. Neurologic: Grossly intact, no focal deficits, moving all 4 extremities. Psychiatric: Normal mood and affect.  Laboratory Data: Lab Results  Component Value Date   WBC 3.8 (L) 01/22/2023   HGB 9.4 (L) 01/22/2023   HCT 27.9 (L) 01/22/2023   MCV 132.9 (H) 01/22/2023   PLT 107 (L) 01/22/2023    Lab Results  Component  Value Date   CREATININE 1.43 (H) 01/22/2023    Lab Results  Component Value Date   AST 48 (H) 01/22/2023   Lab Results  Component Value Date   ALT 17 01/22/2023   Urinalysis See EPIC and HPI  I have reviewed the labs.   Pertinent Imaging: Narrative & Impression  CLINICAL DATA:  Lower abdominal pain.   EXAM: CT ABDOMEN AND PELVIS WITH CONTRAST   TECHNIQUE: Multidetector CT imaging of the abdomen and pelvis was performed using the standard protocol following bolus administration of intravenous contrast.   RADIATION DOSE REDUCTION: This exam was performed according to the departmental dose-optimization program which includes automated exposure control, adjustment of the mA and/or kV according to patient size and/or use of iterative reconstruction technique.   CONTRAST:  80mL OMNIPAQUE IOHEXOL 300 MG/ML  SOLN   COMPARISON:  Jul 20, 2021.   FINDINGS: Lower chest: Small sliding-type hiatal  hernia is noted. 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   Hepatobiliary: Cholelithiasis. No biliary dilatation. Findings consistent with hepatic cirrhosis.   Pancreas: Unremarkable. No pancreatic ductal dilatation or surrounding inflammatory changes.   Spleen: Normal in size without focal abnormality.   Adrenals/Urinary Tract: Adrenal glands appear normal. Left renal cyst is noted for which no further follow-up is required. 2.0 x 1.3 cm staghorn type calculus is noted in lower pole collecting system of left kidney. No hydronephrosis or renal obstruction is noted. Urinary bladder is unremarkable.   Stomach/Bowel: Stomach is unremarkable. There is no evidence of bowel obstruction or inflammation. Appendix is unremarkable.   Vascular/Lymphatic: Aortic atherosclerosis. No enlarged abdominal or pelvic lymph nodes.   Reproductive: Status post prostatic surgery.   Other: Moderate sized right inguinal hernia is noted containing fat and fluid. Mild ascites is noted in the pelvis and around the liver and spleen.   Musculoskeletal: No acute or significant osseous findings.   IMPRESSION: Hepatic cirrhosis with mild ascites. 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   Cholelithiasis.   2 cm staghorn type calculus seen in lower pole collecting system of left kidney.   Moderate size right inguinal hernia containing fat and fluid.   Aortic Atherosclerosis (ICD10-I70.0).     Electronically Signed   By: Lupita Raider M.D.   On: 01/22/2023 18:21  SD > 1500 HU  KUB ***, radiology interpretation still pending.   I have independently reviewed the films.    Assessment & Plan:  ***  1. Left staghorn stone -repeat BMP -discussed that the size and the location of the stone would require a PCNL to treat definitively  2.  BPH with LUTS -UA benign *** -PVR < 300 cc *** -symptoms - *** -most bothersome symptoms are *** -continue conservative management, avoiding bladder irritants and  timed voiding's -Initiate alpha-blocker (***), discussed side effects *** -Initiate 5 alpha reductase inhibitor (***), discussed side effects *** -Continue tamsulosin 0.4 mg daily, alfuzosin 10 mg daily, Rapaflo 8 mg daily, terazosin, doxazosin, Cialis 5 mg daily and finasteride 5 mg daily, dutasteride 0.5 mg daily***:refills given -Cannot tolerate medication or medication failure, schedule cystoscopy ***  No follow-ups on file.  These notes generated with voice recognition software. I apologize for typographical errors.  Cloretta Ned  [facility] [address]  [address] [place], [place] [address] [phone]

## 2023-04-09 ENCOUNTER — Ambulatory Visit
Admission: RE | Admit: 2023-04-09 | Discharge: 2023-04-09 | Disposition: A | Discharge status: Home / Self Care | Payer: Medicare HMO | Attending: Urology | Admitting: Urology

## 2023-04-09 ENCOUNTER — Other Ambulatory Visit: Payer: Self-pay | Admitting: Urology

## 2023-04-09 ENCOUNTER — Ambulatory Visit
Admission: RE | Admit: 2023-04-09 | Discharge: 2023-04-09 | Disposition: A | Discharge status: Home / Self Care | Payer: Medicare HMO | Source: Ambulatory Visit | Attending: Urology | Admitting: Urology

## 2023-04-09 ENCOUNTER — Ambulatory Visit: Payer: Medicare HMO | Admitting: Urology

## 2023-04-09 ENCOUNTER — Encounter: Payer: Self-pay | Admitting: Urology

## 2023-04-09 VITALS — BP 118/84 | HR 89 | Ht 66.0 in | Wt 117.0 lb

## 2023-04-09 DIAGNOSIS — N401 Enlarged prostate with lower urinary tract symptoms: Secondary | ICD-10-CM | POA: Diagnosis not present

## 2023-04-09 DIAGNOSIS — N200 Calculus of kidney: Secondary | ICD-10-CM

## 2023-04-09 DIAGNOSIS — R3912 Poor urinary stream: Secondary | ICD-10-CM

## 2023-04-09 DIAGNOSIS — R3129 Other microscopic hematuria: Secondary | ICD-10-CM | POA: Diagnosis not present

## 2023-04-09 LAB — URINALYSIS, COMPLETE
Bilirubin, UA: NEGATIVE
Nitrite, UA: NEGATIVE
Specific Gravity, UA: 1.025 (ref 1.005–1.030)
Urobilinogen, Ur: 0.2 mg/dL (ref 0.2–1.0)
pH, UA: 5 (ref 5.0–7.5)

## 2023-04-09 LAB — MICROSCOPIC EXAMINATION
Epithelial Cells (non renal): >10 /[HPF] — AB (ref 0–10)
WBC, UA: >30 /[HPF] — AB (ref 0–5)

## 2023-04-09 NOTE — Patient Instructions (Signed)

## 2023-04-10 LAB — BASIC METABOLIC PANEL
BUN/Creatinine Ratio: 12 (ref 10–24)
BUN: 17 mg/dL (ref 8–27)
CO2: 13 mmol/L — ABNORMAL LOW (ref 20–29)
Calcium: 8.9 mg/dL (ref 8.6–10.2)
Chloride: 104 mmol/L (ref 96–106)
Creatinine, Ser: 1.47 mg/dL — ABNORMAL HIGH (ref 0.76–1.27)
Glucose: 95 mg/dL (ref 70–99)
Potassium: 5 mmol/L (ref 3.5–5.2)
Sodium: 139 mmol/L (ref 134–144)
eGFR: 50 mL/min/{1.73_m2} — ABNORMAL LOW (ref >59)

## 2023-04-13 ENCOUNTER — Other Ambulatory Visit: Payer: Self-pay | Admitting: Urology

## 2023-04-13 ENCOUNTER — Other Ambulatory Visit: Payer: Self-pay | Admitting: *Deleted

## 2023-04-13 LAB — CULTURE, URINE COMPREHENSIVE

## 2023-04-13 MED ORDER — AMOXICILLIN-POT CLAVULANATE 500-125 MG PO TABS: 1.0000 | ORAL_TABLET | Freq: Two times a day (BID) | ORAL | 0 refills | Status: AC

## 2023-05-21 ENCOUNTER — Other Ambulatory Visit: Payer: Self-pay | Admitting: Urology

## 2023-07-14 ENCOUNTER — Emergency Department
Admission: EM | Admit: 2023-07-14 | Discharge: 2023-07-14 | Discharge status: Against Medical Advice | Attending: Emergency Medicine | Admitting: Emergency Medicine

## 2023-07-14 ENCOUNTER — Emergency Department

## 2023-07-14 ENCOUNTER — Encounter (HOSPITAL_COMMUNITY): Payer: Self-pay | Admitting: Emergency Medicine

## 2023-07-14 ENCOUNTER — Emergency Department (HOSPITAL_COMMUNITY)
Admission: EM | Admit: 2023-07-14 | Discharge: 2023-07-15 | Discharge status: Against Medical Advice | Source: Home / Self Care

## 2023-07-14 ENCOUNTER — Other Ambulatory Visit: Payer: Self-pay

## 2023-07-14 DIAGNOSIS — R0602 Shortness of breath: Secondary | ICD-10-CM | POA: Insufficient documentation

## 2023-07-14 DIAGNOSIS — R5383 Other fatigue: Secondary | ICD-10-CM | POA: Insufficient documentation

## 2023-07-14 DIAGNOSIS — L0231 Cutaneous abscess of buttock: Secondary | ICD-10-CM | POA: Insufficient documentation

## 2023-07-14 DIAGNOSIS — K641 Second degree hemorrhoids: Secondary | ICD-10-CM | POA: Diagnosis not present

## 2023-07-14 DIAGNOSIS — W19XXXA Unspecified fall, initial encounter: Secondary | ICD-10-CM | POA: Insufficient documentation

## 2023-07-14 DIAGNOSIS — M25511 Pain in right shoulder: Secondary | ICD-10-CM | POA: Insufficient documentation

## 2023-07-14 DIAGNOSIS — K529 Noninfective gastroenteritis and colitis, unspecified: Secondary | ICD-10-CM | POA: Diagnosis not present

## 2023-07-14 DIAGNOSIS — K4090 Unilateral inguinal hernia, without obstruction or gangrene, not specified as recurrent: Secondary | ICD-10-CM | POA: Insufficient documentation

## 2023-07-14 DIAGNOSIS — Z5321 Procedure and treatment not carried out due to patient leaving prior to being seen by health care provider: Secondary | ICD-10-CM | POA: Insufficient documentation

## 2023-07-14 LAB — BASIC METABOLIC PANEL WITH GFR
Anion gap: 8 (ref 5–15)
BUN: 33 mg/dL — ABNORMAL HIGH (ref 8–23)
CO2: 15 mmol/L — ABNORMAL LOW (ref 22–32)
Calcium: 8.7 mg/dL — ABNORMAL LOW (ref 8.9–10.3)
Chloride: 109 mmol/L (ref 98–111)
Creatinine, Ser: 2.12 mg/dL — ABNORMAL HIGH (ref 0.61–1.24)
GFR, Estimated: 32 mL/min — ABNORMAL LOW (ref >60)
Glucose, Bld: 68 mg/dL — ABNORMAL LOW (ref 70–99)
Potassium: 5.4 mmol/L — ABNORMAL HIGH (ref 3.5–5.1)
Sodium: 132 mmol/L — ABNORMAL LOW (ref 135–145)

## 2023-07-14 LAB — CBC
HCT: 28.6 % — ABNORMAL LOW (ref 39.0–52.0)
Hemoglobin: 9.3 g/dL — ABNORMAL LOW (ref 13.0–17.0)
MCH: 41.7 pg — ABNORMAL HIGH (ref 26.0–34.0)
MCHC: 32.5 g/dL (ref 30.0–36.0)
MCV: 128.3 fL — ABNORMAL HIGH (ref 80.0–100.0)
Platelets: 187 10*3/uL (ref 150–400)
RBC: 2.23 MIL/uL — ABNORMAL LOW (ref 4.22–5.81)
RDW: 13.2 % (ref 11.5–15.5)
WBC: 7.9 10*3/uL (ref 4.0–10.5)
nRBC: 0 % (ref 0.0–0.2)

## 2023-07-14 NOTE — ED Notes (Signed)
 Patient's wife to desk stating they no longer wanted to wait. Patient and family encouraged to stay but declined. Pt ambulatory out of ED without difficulty. NAD noted.

## 2023-07-14 NOTE — ED Triage Notes (Signed)
 Pt seen at [facility] yesterday due to lethargy and not feeling well. Family reports he fell last week and having right shoulder pain. Pt had labs and EKG done today at [facility]. Also reports abscess on bottom since first of the year.

## 2023-07-14 NOTE — ED Triage Notes (Signed)
 Pt presents to the ED POV from home with friend. Pt has a hernia on the right side of his groin area that pt reports hurts into his buttocks. Pt has had shortness of breath for months. Pt is a poor historian. A&Ox4.

## 2023-07-15 ENCOUNTER — Emergency Department (HOSPITAL_BASED_OUTPATIENT_CLINIC_OR_DEPARTMENT_OTHER): Admitting: Radiology

## 2023-07-15 ENCOUNTER — Telehealth: Payer: Self-pay | Admitting: Emergency Medicine

## 2023-07-15 ENCOUNTER — Emergency Department (HOSPITAL_BASED_OUTPATIENT_CLINIC_OR_DEPARTMENT_OTHER)

## 2023-07-15 ENCOUNTER — Other Ambulatory Visit: Payer: Self-pay

## 2023-07-15 ENCOUNTER — Encounter (HOSPITAL_BASED_OUTPATIENT_CLINIC_OR_DEPARTMENT_OTHER): Payer: Self-pay | Admitting: Emergency Medicine

## 2023-07-15 ENCOUNTER — Inpatient Hospital Stay (HOSPITAL_BASED_OUTPATIENT_CLINIC_OR_DEPARTMENT_OTHER)
Admission: EM | Admit: 2023-07-15 | Discharge: 2023-07-23 | DRG: 393 | Disposition: A | Discharge status: Home Health | Attending: Internal Medicine | Admitting: Internal Medicine

## 2023-07-15 DIAGNOSIS — D649 Anemia, unspecified: Secondary | ICD-10-CM | POA: Diagnosis present

## 2023-07-15 DIAGNOSIS — N183 Chronic kidney disease, stage 3 unspecified: Secondary | ICD-10-CM | POA: Diagnosis present

## 2023-07-15 DIAGNOSIS — E43 Unspecified severe protein-calorie malnutrition: Secondary | ICD-10-CM | POA: Diagnosis present

## 2023-07-15 DIAGNOSIS — E785 Hyperlipidemia, unspecified: Secondary | ICD-10-CM | POA: Diagnosis present

## 2023-07-15 DIAGNOSIS — K5730 Diverticulosis of large intestine without perforation or abscess without bleeding: Secondary | ICD-10-CM | POA: Diagnosis present

## 2023-07-15 DIAGNOSIS — Z8719 Personal history of other diseases of the digestive system: Secondary | ICD-10-CM

## 2023-07-15 DIAGNOSIS — K921 Melena: Secondary | ICD-10-CM

## 2023-07-15 DIAGNOSIS — E872 Acidosis, unspecified: Secondary | ICD-10-CM | POA: Diagnosis present

## 2023-07-15 DIAGNOSIS — Z681 Body mass index (BMI) 19 or less, adult: Secondary | ICD-10-CM

## 2023-07-15 DIAGNOSIS — D539 Nutritional anemia, unspecified: Secondary | ICD-10-CM | POA: Diagnosis present

## 2023-07-15 DIAGNOSIS — Z7982 Long term (current) use of aspirin: Secondary | ICD-10-CM

## 2023-07-15 DIAGNOSIS — D7589 Other specified diseases of blood and blood-forming organs: Secondary | ICD-10-CM | POA: Diagnosis present

## 2023-07-15 DIAGNOSIS — K2960 Other gastritis without bleeding: Secondary | ICD-10-CM | POA: Diagnosis present

## 2023-07-15 DIAGNOSIS — R627 Adult failure to thrive: Secondary | ICD-10-CM | POA: Diagnosis present

## 2023-07-15 DIAGNOSIS — E875 Hyperkalemia: Secondary | ICD-10-CM | POA: Diagnosis present

## 2023-07-15 DIAGNOSIS — E871 Hypo-osmolality and hyponatremia: Secondary | ICD-10-CM | POA: Diagnosis present

## 2023-07-15 DIAGNOSIS — N390 Urinary tract infection, site not specified: Secondary | ICD-10-CM | POA: Diagnosis present

## 2023-07-15 DIAGNOSIS — Z860101 Personal history of adenomatous and serrated colon polyps: Secondary | ICD-10-CM

## 2023-07-15 DIAGNOSIS — K2970 Gastritis, unspecified, without bleeding: Secondary | ICD-10-CM

## 2023-07-15 DIAGNOSIS — Z9079 Acquired absence of other genital organ(s): Secondary | ICD-10-CM

## 2023-07-15 DIAGNOSIS — K7031 Alcoholic cirrhosis of liver with ascites: Secondary | ICD-10-CM | POA: Diagnosis present

## 2023-07-15 DIAGNOSIS — K263 Acute duodenal ulcer without hemorrhage or perforation: Secondary | ICD-10-CM | POA: Diagnosis present

## 2023-07-15 DIAGNOSIS — N179 Acute kidney failure, unspecified: Secondary | ICD-10-CM | POA: Diagnosis present

## 2023-07-15 DIAGNOSIS — K219 Gastro-esophageal reflux disease without esophagitis: Secondary | ICD-10-CM | POA: Diagnosis present

## 2023-07-15 DIAGNOSIS — K449 Diaphragmatic hernia without obstruction or gangrene: Secondary | ICD-10-CM | POA: Diagnosis present

## 2023-07-15 DIAGNOSIS — Z8249 Family history of ischemic heart disease and other diseases of the circulatory system: Secondary | ICD-10-CM

## 2023-07-15 DIAGNOSIS — I480 Paroxysmal atrial fibrillation: Secondary | ICD-10-CM | POA: Diagnosis present

## 2023-07-15 DIAGNOSIS — R933 Abnormal findings on diagnostic imaging of other parts of digestive tract: Secondary | ICD-10-CM

## 2023-07-15 DIAGNOSIS — R5383 Other fatigue: Principal | ICD-10-CM

## 2023-07-15 DIAGNOSIS — K641 Second degree hemorrhoids: Principal | ICD-10-CM | POA: Diagnosis present

## 2023-07-15 DIAGNOSIS — K2990 Gastroduodenitis, unspecified, without bleeding: Secondary | ICD-10-CM | POA: Diagnosis present

## 2023-07-15 DIAGNOSIS — I129 Hypertensive chronic kidney disease with stage 1 through stage 4 chronic kidney disease, or unspecified chronic kidney disease: Secondary | ICD-10-CM | POA: Diagnosis present

## 2023-07-15 DIAGNOSIS — R1084 Generalized abdominal pain: Secondary | ICD-10-CM

## 2023-07-15 DIAGNOSIS — F1010 Alcohol abuse, uncomplicated: Secondary | ICD-10-CM | POA: Diagnosis present

## 2023-07-15 DIAGNOSIS — R64 Cachexia: Secondary | ICD-10-CM | POA: Diagnosis present

## 2023-07-15 DIAGNOSIS — E8809 Other disorders of plasma-protein metabolism, not elsewhere classified: Secondary | ICD-10-CM | POA: Diagnosis present

## 2023-07-15 DIAGNOSIS — K529 Noninfective gastroenteritis and colitis, unspecified: Secondary | ICD-10-CM | POA: Diagnosis present

## 2023-07-15 DIAGNOSIS — Z87442 Personal history of urinary calculi: Secondary | ICD-10-CM

## 2023-07-15 DIAGNOSIS — R54 Age-related physical debility: Secondary | ICD-10-CM | POA: Diagnosis present

## 2023-07-15 DIAGNOSIS — R112 Nausea with vomiting, unspecified: Secondary | ICD-10-CM

## 2023-07-15 DIAGNOSIS — R634 Abnormal weight loss: Secondary | ICD-10-CM

## 2023-07-15 DIAGNOSIS — K269 Duodenal ulcer, unspecified as acute or chronic, without hemorrhage or perforation: Secondary | ICD-10-CM

## 2023-07-15 DIAGNOSIS — J449 Chronic obstructive pulmonary disease, unspecified: Secondary | ICD-10-CM | POA: Diagnosis present

## 2023-07-15 DIAGNOSIS — N400 Enlarged prostate without lower urinary tract symptoms: Secondary | ICD-10-CM | POA: Diagnosis present

## 2023-07-15 LAB — CBC WITH DIFFERENTIAL/PLATELET
Abs Immature Granulocytes: 0.03 10*3/uL (ref 0.00–0.07)
Basophils Absolute: 0 10*3/uL (ref 0.0–0.1)
Basophils Relative: 1 %
Eosinophils Absolute: 0 10*3/uL (ref 0.0–0.5)
Eosinophils Relative: 1 %
HCT: 24 % — ABNORMAL LOW (ref 39.0–52.0)
Hemoglobin: 8 g/dL — ABNORMAL LOW (ref 13.0–17.0)
Immature Granulocytes: 1 %
Lymphocytes Relative: 23 %
Lymphs Abs: 1.5 10*3/uL (ref 0.7–4.0)
MCH: 41.9 pg — ABNORMAL HIGH (ref 26.0–34.0)
MCHC: 33.3 g/dL (ref 30.0–36.0)
MCV: 125.7 fL — ABNORMAL HIGH (ref 80.0–100.0)
Monocytes Absolute: 0.7 10*3/uL (ref 0.1–1.0)
Monocytes Relative: 10 %
Neutro Abs: 4.3 10*3/uL (ref 1.7–7.7)
Neutrophils Relative %: 64 %
Platelets: 152 10*3/uL (ref 150–400)
RBC: 1.91 MIL/uL — ABNORMAL LOW (ref 4.22–5.81)
RDW: 13.2 % (ref 11.5–15.5)
WBC: 6.5 10*3/uL (ref 4.0–10.5)
nRBC: 0 % (ref 0.0–0.2)

## 2023-07-15 LAB — URINALYSIS, ROUTINE W REFLEX MICROSCOPIC
Bacteria, UA: NONE SEEN
Bilirubin Urine: NEGATIVE
Glucose, UA: NEGATIVE mg/dL
Ketones, ur: NEGATIVE mg/dL
Nitrite: NEGATIVE
Specific Gravity, Urine: 1.018 (ref 1.005–1.030)
WBC, UA: >50 WBC/hpf (ref 0–5)
pH: 5.5 (ref 5.0–8.0)

## 2023-07-15 LAB — COMPREHENSIVE METABOLIC PANEL WITH GFR
ALT: 9 U/L (ref 0–44)
AST: 26 U/L (ref 15–41)
Albumin: 2.6 g/dL — ABNORMAL LOW (ref 3.5–5.0)
Alkaline Phosphatase: 68 U/L (ref 38–126)
Anion gap: 13 (ref 5–15)
BUN: 34 mg/dL — ABNORMAL HIGH (ref 8–23)
CO2: 14 mmol/L — ABNORMAL LOW (ref 22–32)
Calcium: 8.9 mg/dL (ref 8.9–10.3)
Chloride: 107 mmol/L (ref 98–111)
Creatinine, Ser: 1.94 mg/dL — ABNORMAL HIGH (ref 0.61–1.24)
GFR, Estimated: 36 mL/min — ABNORMAL LOW (ref >60)
Glucose, Bld: 73 mg/dL (ref 70–99)
Potassium: 5.2 mmol/L — ABNORMAL HIGH (ref 3.5–5.1)
Sodium: 134 mmol/L — ABNORMAL LOW (ref 135–145)
Total Bilirubin: 0.6 mg/dL (ref 0.0–1.2)
Total Protein: 6.3 g/dL — ABNORMAL LOW (ref 6.5–8.1)

## 2023-07-15 LAB — PRO BRAIN NATRIURETIC PEPTIDE: Pro Brain Natriuretic Peptide: 999 pg/mL — ABNORMAL HIGH (ref <300.0)

## 2023-07-15 LAB — PROTIME-INR
INR: 1.3 — ABNORMAL HIGH (ref 0.8–1.2)
Prothrombin Time: 16.7 s — ABNORMAL HIGH (ref 11.4–15.2)

## 2023-07-15 LAB — AMMONIA: Ammonia: 21 umol/L (ref 9–35)

## 2023-07-15 LAB — TROPONIN T, HIGH SENSITIVITY
Troponin T High Sensitivity: <15 ng/L (ref <19)
Troponin T High Sensitivity: <15 ng/L (ref <19)

## 2023-07-15 LAB — LACTIC ACID, PLASMA: Lactic Acid, Venous: 1.7 mmol/L (ref 0.5–1.9)

## 2023-07-15 MED ORDER — IOHEXOL 300 MG/ML  SOLN
75.0000 mL | Freq: Once | INTRAMUSCULAR | Status: AC | PRN
  Administered 2023-07-15: 75 mL via INTRAVENOUS

## 2023-07-15 MED ORDER — METRONIDAZOLE 500 MG/100ML IV SOLN
500.0000 mg | Freq: Once | INTRAVENOUS | Status: AC
  Administered 2023-07-15: 500 mg via INTRAVENOUS
  Filled 2023-07-15: qty 100

## 2023-07-15 MED ORDER — SODIUM CHLORIDE 0.9 % IV SOLN
2.0000 g | Freq: Once | INTRAVENOUS | Status: AC
  Administered 2023-07-15: 2 g via INTRAVENOUS
  Filled 2023-07-15: qty 20

## 2023-07-15 MED ORDER — LACTATED RINGERS IV BOLUS
1000.0000 mL | Freq: Once | INTRAVENOUS | Status: AC
  Administered 2023-07-15: 1000 mL via INTRAVENOUS

## 2023-07-15 NOTE — Telephone Encounter (Signed)
 Called patient due to left emergency department before provider exam to inquire about condition and follow up plans.   No answer on pt phone.  Marvin Ortega and she says they are going to either [facility] or [facility] when they get a  ride today.  I told her that he really does need to be seen, so that is good they are going.

## 2023-07-15 NOTE — ED Notes (Signed)
Called multiple times, no response.

## 2023-07-15 NOTE — ED Triage Notes (Signed)
 C/o lethargy and fatigue x 3 days. Patient states it is from his rectal abscess.  Denies fevers. Seen at [facility] and [facility] yesterday but LWBS both times.

## 2023-07-15 NOTE — Plan of Care (Signed)
 Plan of Care Note for accepted transfer   Patient name: Marvin Ortega UJW:[id number] DOB: Jul 27, 1950  Facility requesting transfer: [place] EMERGENCY DEPARTMENT AT [facility]  Requesting Provider: Dr. Tamela Fake Reason for transfer: Colitis, UTI Facility course: 73 year old male with history of alcoholic liver disease, anemia, paroxysmal A-fib not on anticoagulation due to presumed alcohol use, BPH, COPD, GERD, history of GI bleed, hypertension, prediabetes presented to the ED with complaints of generalized weakness/fatigue and poor p.o. intake over the past few days.  Patient also reported rectal pain and bleeding and history of perirectal abscess.  SBP as low as 80s but remainder of vital signs stable.  Labs showing no leukocytosis, hemoglobin 8.0 (baseline 8-9 range), sodium 134, potassium 5.2, bicarb 14, glucose 73, BUN 34, creatinine 1.9 (previously creatinine 1.4 in January 2025), albumin 2.6, normal LFTs, lactate normal, blood cultures in process, troponin negative x 2, proBNP 999, ammonia level normal.  UA with negative nitrite, large amount of leukocytes, and microscopy showing 11-20 RBCs, >50 WBCs, and no bacteria.  Chest x-ray and CT head negative for acute findings.    CT abdomen pelvis with contrast showing: "IMPRESSION: *Mucosal thickening of the cecum and ascending colon with subtle stranding of the mesenteric fat in the right lower quadrant with normal-appearing cecal appendix could correlate with colitis. *Heterogeneous nodular appearing liver could correlate with hepatocellular disease such as cirrhosis without parenchymal lesions or biliary dilatation. *Multiple gallstones without gallbladder inflammatory changes. *Small amount of fluid surrounding the liver correlates with minimal ascites. *Left nephrolithiasis without hydronephrosis. *Aortic atherosclerosis."  Patient was given ceftriaxone  and 1 L LR in the ED. BP improved, most recent 109/74. I have  requested EDP to add on flagyl for colitis.   Plan of care: The patient is accepted for admission to Progressive unit at [facility].  [facility] will assume care on arrival to accepting facility. Until arrival, care as per EDP. However, [facility] available 24/7 for questions and assistance.  Check [email or web address] for on-call coverage.  Nursing staff, please call [facility] Admits & Consults System-Wide number under Amion on patient's arrival so appropriate admitting provider can evaluate the pt. And CT head

## 2023-07-15 NOTE — ED Notes (Signed)
 Two sets of blood cultures collected before any abx started

## 2023-07-15 NOTE — ED Provider Notes (Signed)
 [place] EMERGENCY DEPARTMENT AT [facility] Provider Note   CSN: [id number] Arrival date & time: 07/15/23  1440     History {Add pertinent medical, surgical, social history, OB history to HPI:1} Chief Complaint  Patient presents with   Fatigue    YSIDORO AZIZI is a 73 y.o. male.  HPI      73 year old male with a history of alcoholic cirrhosis, atrial fibrillation not on anticoagulation due to presumed alcohol use, BPH, COPD, GERD, history of GI bleed, hypertension, prediabetes, admission for sepsis and epididymitis last year who presents with concern for generalized weakness.  He reports that he has been feeling unwell since January when he was diagnosed with a perirectal abscess.  He reports he told his doctor about this and was seen but not placed on antibiotics.  It is unclear if he was diagnosed with an abscess, but he reports he has been feeling sick since January and having rectal pain since then as well.  He also reports since January he has had nausea and vomiting approximately 3 times per day.  He also reports having diarrhea since then which varies in amounts, sometimes 3 times a day sometimes 5.  He denies any black tarry stool but reports that he does have bright red blood at times.  He acknowledges waxing and waning abdominal pain.  He reports he was diagnosed with a right inguinal hernia but has not been having significant pain in that area.  He has had chronic shortness of breath that he reports has not significantly worsened, however his ex-wife who is now caring for him reports she believes he has appeared more short of breath.  He denies any chest pain.  He had told her the other day he had shoulder pain when he checked in to [facility].  He reports he has not had alcohol in 4 days.  He does not believe he has had any withdrawal symptoms.  His ex-wife reports that he has been drinking alcohol that is lower in alcohol content.  While he has been having nausea  and vomiting, this has been ongoing for several months and not new recently, has not had new recent tremulousness or anxiety.  Has tingling of both hands at times chronically.     Past Medical History:  Diagnosis Date   Alcoholic cirrhosis of liver (HCC)    Anemia    Atrial fibrillation (HCC)    Atrial fibrillation (HCC)    BPH (benign prostatic hyperplasia)    COPD (chronic obstructive pulmonary disease) (HCC)    Dieulafoy lesion of stomach    GERD (gastroesophageal reflux disease)    h/o   GIB (gastrointestinal bleeding) 02/15/2018   Hemorrhagic shock (HCC)    History of kidney stones    Hypertension    Pre-diabetes    Sepsis (HCC)    Upper GI bleed         Home Medications Prior to Admission medications   Medication Sig Start Date End Date Taking? Authorizing Provider  albuterol  (VENTOLIN  HFA) 108 (90 Base) MCG/ACT inhaler Inhale 2 puffs into the lungs every 6 (six) hours as needed for wheezing or shortness of breath. 11/07/20   Marc Senior, MD  aspirin  EC 81 MG tablet Take 81 mg by mouth daily.    [provider]  atorvastatin  (LIPITOR) 20 MG tablet Take 1 tablet by mouth daily. 08/02/20 08/02/21  [provider]  cyanocobalamin (VITAMIN B12) 1000 MCG tablet Take 1,000 mcg by mouth daily.  [provider]  finasteride  (PROSCAR ) 5 MG tablet Take 5 mg by mouth every morning.     [provider]  metoprolol  succinate (TOPROL -XL) 25 MG 24 hr tablet Take 25 mg by mouth daily. 03/25/23   [provider]  pantoprazole  (PROTONIX ) 40 MG tablet Take 40 mg by mouth daily.    [provider]  spironolactone  (ALDACTONE ) 50 MG tablet Take 50 mg by mouth daily. 03/25/23   [provider]      Allergies    Patient has no known allergies.    Review of Systems   Review of Systems  Physical Exam Updated Vital Signs BP 96/68 (BP Location: Right Arm)   Pulse 84   Temp 97.9 F (36.6 C)   Resp 18   SpO2 95%   Physical Exam Vitals and nursing note reviewed.  Constitutional:      General: He is not in acute distress.    Appearance: He is well-developed. He is not diaphoretic.  HENT:     Head: Normocephalic and atraumatic.  Eyes:     Conjunctiva/sclera: Conjunctivae normal.  Cardiovascular:     Rate and Rhythm: Normal rate and regular rhythm.     Heart sounds: Normal heart sounds. No murmur heard.    No friction rub. No gallop.  Pulmonary:     Effort: Pulmonary effort is normal. No respiratory distress.     Breath sounds: Normal breath sounds. No wheezing or rales.  Abdominal:     General: There is no distension.     Palpations: Abdomen is soft.     Tenderness: There is abdominal tenderness. There is no guarding.  Genitourinary:    Comments: External hemorrhoids Narrow rectum with tenderness  Musculoskeletal:     Cervical back: Normal range of motion.  Skin:    General: Skin is warm and dry.  Neurological:     Mental Status: He is alert and oriented to person, place, and time.     ED Results / Procedures / Treatments   Labs (all labs ordered are listed, but only abnormal results are displayed) Labs Reviewed  CBC WITH DIFFERENTIAL/PLATELET  COMPREHENSIVE METABOLIC PANEL WITH GFR  LACTIC ACID, PLASMA  LACTIC ACID, PLASMA  URINALYSIS, ROUTINE W REFLEX MICROSCOPIC  PRO BRAIN NATRIURETIC PEPTIDE  TROPONIN T, HIGH SENSITIVITY    EKG None  Radiology DG Chest 2 View Result Date: 07/14/2023 CLINICAL DATA:  Shortness of breath. EXAM: CHEST - 2 VIEW COMPARISON:  06/02/2020. FINDINGS: Redemonstration of pre-existing 1.6 x 1.9 cm nodular opacity overlying the left mid lung zone, essentially similar to the prior study. The opacity corresponds to skin mole on the prior chest CT scan from 07/19/2020. Bilateral lung fields are otherwise essentially clear. No acute consolidation or lung collapse. Bilateral costophrenic angles are clear. Normal cardio-mediastinal silhouette. Note is made of  markedly tortuous descending thoracic aorta. No acute osseous abnormalities. The soft tissues are within normal limits. IMPRESSION: No active cardiopulmonary disease. Electronically Signed   By: Beula Brunswick M.D.   On: 07/14/2023 13:49    Procedures Procedures  {Document cardiac monitor, telemetry assessment procedure when appropriate:1}  Medications Ordered in ED Medications - No data to display  ED Course/ Medical Decision Making/ A&P   {   Click here for ABCD2, HEART and other calculatorsREFRESH Note before signing :1}                               73 year old  male with a history of alcoholic cirrhosis, atrial fibrillation not on anticoagulation due to presumed alcohol use, BPH, COPD, GERD, history of GI bleed, hypertension, prediabetes, admission for sepsis and epididymitis last year who presents with concern for generalized weakness, rectal pain, nausea and vomiting, diarrhea.   Differential diagnosis includes anemia/GI bleed, dehydration, gastroenteritis, rectal abscess, anal fissure, rectal mass, ACS, ICH, UTI, hernia, ***.  Blood pressures do appear to be lower than prior baseline in November 2024.  Given this, ordered blood cultures, lactic acid and other evaluation.  He is afebrile., not tachycardic and not clear sepsis at this time to give empiric abx on arrival.  EKG completed and personally evaluated by me shows sinus rhythm with no significant change in comparison to prior.  Labs completed and personally evaluated and interpreted by me show anemia.  Hemoglobin today is 8.0, yesterday was 9.3, in the past has been around 8.3-9.4.  Does not describe significant acute bleeding symptoms and given similar values between 8 and 9 in the past do not suspect acute GI bleed at this time as etiology of his symptoms.  CMP shows mild hyponatremia, hyperkalemia, and a nonanion gap metabolic acidosis.  His creatinine is slightly increased from prior values in January of 1.47.  Lactic acid  was within normal limits.  Urinalysis is consistent with urinary tract infection with greater than 50 white blood cells.  Troponin is negative and doubt ACS as etiology of his shoulder pain.  BNP is mildly elevated 999.  Will give Rocephin  with concern for urinary tract infection.  CT head and CT abdomen pelvis were evaluated by me and show***  {Document critical care time when appropriate:1} {Document review of labs and clinical decision tools ie heart score, Chads2Vasc2 etc:1}  {Document your independent review of radiology images, and any outside records:1} {Document your discussion with family members, caretakers, and with consultants:1} {Document social determinants of health affecting pt's care:1} {Document your decision making why or why not admission, treatments were needed:1} Final Clinical Impression(s) / ED Diagnoses Final diagnoses:  None    Rx / DC Orders ED Discharge Orders     None

## 2023-07-16 ENCOUNTER — Other Ambulatory Visit: Admitting: Urology

## 2023-07-16 DIAGNOSIS — F1090 Alcohol use, unspecified, uncomplicated: Secondary | ICD-10-CM

## 2023-07-16 DIAGNOSIS — K263 Acute duodenal ulcer without hemorrhage or perforation: Secondary | ICD-10-CM | POA: Diagnosis not present

## 2023-07-16 DIAGNOSIS — K529 Noninfective gastroenteritis and colitis, unspecified: Secondary | ICD-10-CM

## 2023-07-16 DIAGNOSIS — F1010 Alcohol abuse, uncomplicated: Secondary | ICD-10-CM | POA: Diagnosis not present

## 2023-07-16 DIAGNOSIS — Z681 Body mass index (BMI) 19 or less, adult: Secondary | ICD-10-CM | POA: Diagnosis not present

## 2023-07-16 DIAGNOSIS — D539 Nutritional anemia, unspecified: Secondary | ICD-10-CM

## 2023-07-16 DIAGNOSIS — R627 Adult failure to thrive: Secondary | ICD-10-CM | POA: Diagnosis not present

## 2023-07-16 DIAGNOSIS — R5383 Other fatigue: Secondary | ICD-10-CM

## 2023-07-16 DIAGNOSIS — R112 Nausea with vomiting, unspecified: Secondary | ICD-10-CM | POA: Diagnosis not present

## 2023-07-16 DIAGNOSIS — K2100 Gastro-esophageal reflux disease with esophagitis, without bleeding: Secondary | ICD-10-CM

## 2023-07-16 DIAGNOSIS — K7031 Alcoholic cirrhosis of liver with ascites: Secondary | ICD-10-CM

## 2023-07-16 DIAGNOSIS — R933 Abnormal findings on diagnostic imaging of other parts of digestive tract: Secondary | ICD-10-CM

## 2023-07-16 DIAGNOSIS — R1084 Generalized abdominal pain: Secondary | ICD-10-CM

## 2023-07-16 DIAGNOSIS — N390 Urinary tract infection, site not specified: Secondary | ICD-10-CM | POA: Diagnosis not present

## 2023-07-16 DIAGNOSIS — E875 Hyperkalemia: Secondary | ICD-10-CM | POA: Diagnosis not present

## 2023-07-16 DIAGNOSIS — E785 Hyperlipidemia, unspecified: Secondary | ICD-10-CM | POA: Diagnosis not present

## 2023-07-16 DIAGNOSIS — I129 Hypertensive chronic kidney disease with stage 1 through stage 4 chronic kidney disease, or unspecified chronic kidney disease: Secondary | ICD-10-CM | POA: Diagnosis not present

## 2023-07-16 DIAGNOSIS — K2990 Gastroduodenitis, unspecified, without bleeding: Secondary | ICD-10-CM | POA: Diagnosis not present

## 2023-07-16 DIAGNOSIS — N179 Acute kidney failure, unspecified: Secondary | ICD-10-CM | POA: Diagnosis not present

## 2023-07-16 DIAGNOSIS — R109 Unspecified abdominal pain: Secondary | ICD-10-CM

## 2023-07-16 DIAGNOSIS — E872 Acidosis, unspecified: Secondary | ICD-10-CM | POA: Diagnosis not present

## 2023-07-16 DIAGNOSIS — K641 Second degree hemorrhoids: Secondary | ICD-10-CM | POA: Diagnosis not present

## 2023-07-16 DIAGNOSIS — E871 Hypo-osmolality and hyponatremia: Secondary | ICD-10-CM | POA: Diagnosis not present

## 2023-07-16 DIAGNOSIS — E8809 Other disorders of plasma-protein metabolism, not elsewhere classified: Secondary | ICD-10-CM | POA: Diagnosis not present

## 2023-07-16 DIAGNOSIS — E43 Unspecified severe protein-calorie malnutrition: Secondary | ICD-10-CM | POA: Diagnosis not present

## 2023-07-16 DIAGNOSIS — R64 Cachexia: Secondary | ICD-10-CM | POA: Diagnosis not present

## 2023-07-16 DIAGNOSIS — I480 Paroxysmal atrial fibrillation: Secondary | ICD-10-CM | POA: Diagnosis not present

## 2023-07-16 DIAGNOSIS — N183 Chronic kidney disease, stage 3 unspecified: Secondary | ICD-10-CM | POA: Diagnosis not present

## 2023-07-16 DIAGNOSIS — J449 Chronic obstructive pulmonary disease, unspecified: Secondary | ICD-10-CM | POA: Diagnosis not present

## 2023-07-16 DIAGNOSIS — K921 Melena: Secondary | ICD-10-CM

## 2023-07-16 DIAGNOSIS — K219 Gastro-esophageal reflux disease without esophagitis: Secondary | ICD-10-CM | POA: Diagnosis not present

## 2023-07-16 DIAGNOSIS — R634 Abnormal weight loss: Secondary | ICD-10-CM

## 2023-07-16 LAB — CBC
HCT: 23.5 % — ABNORMAL LOW (ref 39.0–52.0)
Hemoglobin: 7.7 g/dL — ABNORMAL LOW (ref 13.0–17.0)
MCH: 41.8 pg — ABNORMAL HIGH (ref 26.0–34.0)
MCHC: 32.8 g/dL (ref 30.0–36.0)
MCV: 127.7 fL — ABNORMAL HIGH (ref 80.0–100.0)
Platelets: 153 10*3/uL (ref 150–400)
RBC: 1.84 MIL/uL — ABNORMAL LOW (ref 4.22–5.81)
RDW: 13.3 % (ref 11.5–15.5)
WBC: 5.5 10*3/uL (ref 4.0–10.5)
nRBC: 0 % (ref 0.0–0.2)

## 2023-07-16 LAB — LIPASE, BLOOD: Lipase: 48 U/L (ref 11–51)

## 2023-07-16 LAB — CREATININE, SERUM
Creatinine, Ser: 1.6 mg/dL — ABNORMAL HIGH (ref 0.61–1.24)
GFR, Estimated: 45 mL/min — ABNORMAL LOW (ref >60)

## 2023-07-16 LAB — C-REACTIVE PROTEIN: CRP: 0.8 mg/dL (ref <1.0)

## 2023-07-16 LAB — VITAMIN B12: Vitamin B-12: 3394 pg/mL — ABNORMAL HIGH (ref 180–914)

## 2023-07-16 LAB — SEDIMENTATION RATE: Sed Rate: 50 mm/h — ABNORMAL HIGH (ref 0–16)

## 2023-07-16 MED ORDER — ENOXAPARIN SODIUM 30 MG/0.3ML IJ SOSY
30.0000 mg | PREFILLED_SYRINGE | INTRAMUSCULAR | Status: DC
  Administered 2023-07-16: 30 mg via SUBCUTANEOUS
  Filled 2023-07-16: qty 0.3

## 2023-07-16 MED ORDER — ATORVASTATIN CALCIUM 10 MG PO TABS
20.0000 mg | ORAL_TABLET | Freq: Every day | ORAL | Status: DC
  Administered 2023-07-16 – 2023-07-23 (×7): 20 mg via ORAL
  Filled 2023-07-16 (×7): qty 2

## 2023-07-16 MED ORDER — METOPROLOL SUCCINATE ER 25 MG PO TB24
25.0000 mg | ORAL_TABLET | Freq: Every day | ORAL | Status: DC
  Administered 2023-07-16: 25 mg via ORAL
  Filled 2023-07-16: qty 1

## 2023-07-16 MED ORDER — METRONIDAZOLE 500 MG PO TABS
500.0000 mg | ORAL_TABLET | Freq: Two times a day (BID) | ORAL | Status: DC
  Administered 2023-07-16 – 2023-07-21 (×10): 500 mg via ORAL
  Filled 2023-07-16 (×10): qty 1

## 2023-07-16 MED ORDER — ALBUTEROL SULFATE (2.5 MG/3ML) 0.083% IN NEBU: 2.5000 mL | INHALATION_SOLUTION | Freq: Four times a day (QID) | RESPIRATORY_TRACT | Status: DC | PRN

## 2023-07-16 MED ORDER — SODIUM CHLORIDE 0.9 % IV SOLN: INTRAVENOUS | Status: AC

## 2023-07-16 MED ORDER — SODIUM CHLORIDE 0.9 % IV SOLN
2.0000 g | Freq: Every day | INTRAVENOUS | Status: DC
  Administered 2023-07-16 – 2023-07-21 (×6): 2 g via INTRAVENOUS
  Filled 2023-07-16 (×6): qty 20

## 2023-07-16 MED ORDER — FINASTERIDE 5 MG PO TABS
5.0000 mg | ORAL_TABLET | Freq: Every morning | ORAL | Status: DC
  Administered 2023-07-16 – 2023-07-23 (×7): 5 mg via ORAL
  Filled 2023-07-16 (×7): qty 1

## 2023-07-16 MED ORDER — PANTOPRAZOLE SODIUM 40 MG PO TBEC
40.0000 mg | DELAYED_RELEASE_TABLET | Freq: Every day | ORAL | Status: DC
  Administered 2023-07-16 – 2023-07-17 (×2): 40 mg via ORAL
  Filled 2023-07-16 (×2): qty 1

## 2023-07-16 MED ORDER — VITAMIN B-12 1000 MCG PO TABS
1000.0000 ug | ORAL_TABLET | Freq: Every day | ORAL | Status: DC
  Administered 2023-07-16 – 2023-07-23 (×7): 1000 ug via ORAL
  Filled 2023-07-16 (×7): qty 1

## 2023-07-16 NOTE — ED Notes (Signed)
 Bolivar Bushman, MD aware of BP. Pt A&O x4, afebrile, NSR.

## 2023-07-16 NOTE — ED Notes (Signed)
 CCMD called to add pt to monitor.

## 2023-07-16 NOTE — Progress Notes (Signed)
 Physical Therapy Evaluation Patient Details Name: Marvin Ortega MRN: [id number] DOB: 11-24-1950 Today's Date: 07/16/2023  History of Present Illness  73 y.o. male presented 5/7 to the ED with complaints of generalized weakness/fatigue.  Failure to thrive and Thickened cecum , p resumed colitis.  PMH: alcoholic liver disease, anemia, paroxysmal A-fib not on anticoagulation due to presumed alcohol use, BPH, COPD, GERD, history of GI bleed, hypertension, prediabetes.  Clinical Impression  Pt admitted with above diagnosis. Accompanied by ex-wife who reports pt could stay with her for a few days after d/c if needed but has been trying to arrange a [facility] aide for pt to help with IADLs at home. He was mod I PTA, not using assistive device but reports multiple falls at home. Able to ambulate with therapist today at a CGA level using RW. Pt would likely reduce fall risk significantly if using his rollator at home. Anticipate he will improve and benefit from HHPT at d/c if pt agreeable to use rollator to reduce fall risk. Will progress as tolerated during admission. Update recs as appropriate if higher level of care needed. Pt currently with functional limitations due to the deficits listed below (see PT Problem List). Pt will benefit from acute skilled PT to increase their independence and safety with mobility to allow discharge.           If plan is discharge home, recommend the following: A little help with walking and/or transfers;Assistance with cooking/housework;Assist for transportation   Can travel by private vehicle        Equipment Recommendations None recommended by PT  Recommendations for Other Services       Functional Status Assessment Patient has had a recent decline in their functional status and demonstrates the ability to make significant improvements in function in a reasonable and predictable amount of time.     Precautions / Restrictions Precautions Precautions: Fall Recall of  Precautions/Restrictions: Impaired Restrictions Weight Bearing Restrictions Per Provider Order: No      Mobility  Bed Mobility Overal bed mobility: Needs Assistance Bed Mobility: Supine to Sit, Sit to Supine     Supine to sit: Supervision Sit to supine: Supervision   General bed mobility comments: Supervision for safety, slow and effortful but without physical assist.    Transfers Overall transfer level: Needs assistance Equipment used: Rolling walker (2 wheels) Transfers: Sit to/from Stand Sit to Stand: Supervision           General transfer comment: Supervision for safety, RW for support, cues for hand placement, slight posterior lean upon rising.    Ambulation/Gait Ambulation/Gait assistance: Contact guard assist Gait Distance (Feet): 15 Feet Assistive device: Rolling walker (2 wheels) Gait Pattern/deviations: Step-through pattern, Decreased stride length, Drifts right/left, Leaning posteriorly Gait velocity: dec Gait velocity interpretation: <1.31 ft/sec, indicative of household ambulator   General Gait Details: CGA for safety, educated on AD with RW use to stabilize, demonstrates mild instability but corrects himself with reliance on RW. Leaning posteriorly at times. Denies dizziness.  Stairs            Wheelchair Mobility     Tilt Bed    Modified Rankin (Stroke Patients Only)       Balance Overall balance assessment: Needs assistance Sitting-balance support: No upper extremity supported, Feet supported Sitting balance-Leahy Scale: Good     Standing balance support: No upper extremity supported Standing balance-Leahy Scale: Fair Standing balance comment: increased sway without UE support.  Pertinent Vitals/Pain Pain Assessment Pain Assessment: No/denies pain    Home Living Family/patient expects to be discharged to:: Private residence Living Arrangements: Alone Available Help at Discharge:  Family;Available PRN/intermittently (ex wife can assist for a few days) Type of Home: Apartment Home Access: Level entry       Home Layout: One level Home Equipment: Rollator (4 wheels);Cane - quad;Shower seat      Prior Function Prior Level of Function : Independent/Modified Independent;History of Falls (last six months)             Mobility Comments: Reports not using his devices for gait. Multiple falls in the past 6 months. ADLs Comments: Mod I     Extremity/Trunk Assessment   Upper Extremity Assessment Upper Extremity Assessment: Defer to OT evaluation    Lower Extremity Assessment Lower Extremity Assessment: Generalized weakness       Communication   Communication Communication: No apparent difficulties    Cognition Arousal: Alert Behavior During Therapy: WFL for tasks assessed/performed   PT - Cognitive impairments: No apparent impairments                         Following commands: Impaired Following commands impaired: Only follows one step commands consistently, Follows one step commands with increased time     Cueing Cueing Techniques: Verbal cues     General Comments General comments (skin integrity, edema, etc.): See orthostatics tab, denies dizziness, RN notified.    Exercises     Assessment/Plan    PT Assessment Patient needs continued PT services  PT Problem List Decreased strength;Decreased activity tolerance;Decreased balance;Decreased mobility;Decreased cognition;Decreased knowledge of use of DME;Decreased knowledge of precautions;Decreased safety awareness;Cardiopulmonary status limiting activity       PT Treatment Interventions DME instruction;Gait training;Functional mobility training;Therapeutic activities;Therapeutic exercise;Balance training;Neuromuscular re-education;Patient/family education    PT Goals (Current goals can be found in the Care Plan section)  Acute Rehab PT Goals Patient Stated Goal: Get well PT Goal  Formulation: With patient Time For Goal Achievement: 07/30/23 Potential to Achieve Goals: Good    Frequency Min 2X/week     Co-evaluation               AM-PAC PT "6 Clicks" Mobility  Outcome Measure Help needed turning from your back to your side while in a flat bed without using bedrails?: None Help needed moving from lying on your back to sitting on the side of a flat bed without using bedrails?: A Little Help needed moving to and from a bed to a chair (including a wheelchair)?: A Little Help needed standing up from a chair using your arms (e.g., wheelchair or bedside chair)?: A Little Help needed to walk in hospital room?: A Little Help needed climbing 3-5 steps with a railing? : A Lot 6 Click Score: 18    End of Session Equipment Utilized During Treatment: Gait belt Activity Tolerance: Patient tolerated treatment well Patient left: in bed;with call bell/phone within reach;with bed alarm set;with family/visitor present Nurse Communication: Mobility status (orthostatics) PT Visit Diagnosis: Unsteadiness on feet (R26.81);Other abnormalities of gait and mobility (R26.89);Repeated falls (R29.6);Muscle weakness (generalized) (M62.81);History of falling (Z91.81)    Time: 4098-1191 PT Time Calculation (min) (ACUTE ONLY): 22 min   Charges:   PT Evaluation $PT Eval Low Complexity: 1 Low   PT General Charges $$ ACUTE PT VISIT: 1 Visit         Jory Ng, PT, DPT [facility]  Rehabilitation Services Physical Therapist Office: [phone]  Website: [email or web address]   Alinda Irani 07/16/2023, 3:23 PM

## 2023-07-16 NOTE — H&P (Signed)
 History and Physical    Patient: Marvin Ortega DOB: 08-Dec-1950 DOA: 07/15/2023 DOS: the patient was seen and examined on 07/16/2023 PCP: Benuel Brazier, MD  Patient coming from: Home  Chief Complaint:  Chief Complaint  Patient presents with   Fatigue   HPI: Marvin Ortega is a 73 y.o. male with medical history significant of  alcoholic liver disease, anemia, paroxysmal A-fib not on anticoagulation due to presumed alcohol use, BPH, COPD, GERD, history of GI bleed, hypertension, prediabetes presented to the ED with complaints of generalized weakness/fatigue and poor p.o. intake iso thickened cecum c/f infection vs malignancy on imaging.   Pt states that "since the beginning of the year", he has had decreased PO intake, abd pain, and weight loss. Pt reports that despite eating breakfast and dinner, his weight has decreased from 140lbs to 106 lbs. He attributes this weight loss to a poor appetite. Denies other type B symptoms, such as night sweats and fevers. In addition to weight loss and decreased PO intake, pt endorses fatigue and weakness, but no falls.  Of note, pt was admitted with sepsis 2/2 Serratia bacteremia iso epididymitis in 07/2021.  In the ED, pt labs were notable for K 5.2, Cr 1.94 (baseline 1.0), albumin 2.6, nl WBC (6.5), and Hb 8 and MCV 125. Pt was admitted to medicine for ongoing care.  Review of Systems: As mentioned in the history of present illness. All other systems reviewed and are negative. Past Medical History:  Diagnosis Date   Alcoholic cirrhosis of liver (HCC)    Anemia    Atrial fibrillation (HCC)    Atrial fibrillation (HCC)    BPH (benign prostatic hyperplasia)    COPD (chronic obstructive pulmonary disease) (HCC)    Dieulafoy lesion of stomach    GERD (gastroesophageal reflux disease)    h/o   GIB (gastrointestinal bleeding) 02/15/2018   Hemorrhagic shock (HCC)    History of kidney stones    Hypertension    Pre-diabetes     Sepsis (HCC)    Upper GI bleed    Past Surgical History:  Procedure Laterality Date   CYSTOSCOPY W/ RETROGRADES Right 11/23/2018   Procedure: CYSTOSCOPY WITH RETROGRADE PYELOGRAM;  Surgeon: Geraline Knapp, MD;  Location: [facility] ORS;  Service: Urology;  Laterality: Right;   CYSTOSCOPY WITH HOLMIUM LASER LITHOTRIPSY  11/30/2018   Procedure: CYSTOSCOPY WITH HOLMIUM LASER LITHOTRIPSY;  Surgeon: Geraline Knapp, MD;  Location: [facility];  Service: Urology;;   CYSTOSCOPY WITH LITHOLAPAXY N/A 11/23/2018   Procedure: CYSTOSCOPY WITH LITHOLAPAXY;  Surgeon: Geraline Knapp, MD;  Location: [facility];  Service: Urology;  Laterality: N/A;   CYSTOSCOPY WITH LITHOLAPAXY N/A 11/30/2018   Procedure: CYSTOSCOPY WITH LITHOLAPAXY;  Surgeon: Geraline Knapp, MD;  Location: [facility] OR[facility];  Service: Urology;  Laterality: N/A;   CYSTOSCOPY/URETEROSCOPY/HOLMIUM LASER/STENT PLACEMENT Right 11/23/2018   Procedure: CYSTOSCOPY/URETEROSCOPY/HOLMIUM LASER/STENT PLACEMENT;  Surgeon: Geraline Knapp, MD;  Location: [facility];  Service: Urology;  Laterality: Right;   ESOPHAGOGASTRODUODENOSCOPY N/A 02/15/2018   Procedure: ESOPHAGOGASTRODUODENOSCOPY (EGD);  Surgeon: Selena Daily, MD;  Location: [facility] ENDOSCOPY;  Service: Gastroenterology;  Laterality: N/A;   TRANSURETHRAL RESECTION OF PROSTATE N/A 05/24/2019   Procedure: TRANSURETHRAL RESECTION OF THE PROSTATE (TURP);  Surgeon: Geraline Knapp, MD;  Location: [facility] OR[facility];  Service: Urology;  Laterality: N/A;   Social History:  reports that he has never smoked. He has never used smokeless tobacco. He reports that he does not currently use alcohol. He reports that he  does not use drugs.  No Known Allergies  Family History  Problem Relation Age of Onset   Heart disease Father    Aneurysm Sister     Prior to Admission medications   Medication Sig Start Date End Date Taking? Authorizing Provider  albuterol  (VENTOLIN  HFA) 108 (90 Base) MCG/ACT inhaler Inhale 2 puffs into the lungs  every 6 (six) hours as needed for wheezing or shortness of breath. 11/07/20  Yes Marc Senior, MD  aspirin  EC 81 MG tablet Take 81 mg by mouth daily.   Yes [provider]  atorvastatin  (LIPITOR) 20 MG tablet Take 1 tablet by mouth daily. 08/02/20 07/16/23 Yes [provider]  cyanocobalamin (VITAMIN B12) 1000 MCG tablet Take 1,000 mcg by mouth daily.   Yes [provider]  finasteride  (PROSCAR ) 5 MG tablet Take 5 mg by mouth every morning.    Yes [provider]  metoprolol  succinate (TOPROL -XL) 25 MG 24 hr tablet Take 25 mg by mouth daily. 03/25/23  Yes [provider]  pantoprazole  (PROTONIX ) 40 MG tablet Take 40 mg by mouth daily.   Yes [provider]  spironolactone  (ALDACTONE ) 50 MG tablet Take 50 mg by mouth daily. 03/25/23  Yes [provider]    Physical Exam: Vitals:   07/16/23 0700 07/16/23 0819 07/16/23 0825 07/16/23 0836  BP: 92/72  99/75 99/75  Pulse: 77  75 77  Resp: (!) 9  17   Temp:   98.1 F (36.7 C)   TempSrc:   Oral   SpO2: 100%  96%   Weight:  47 kg    Height:  5\' 5"  (1.651 m)     General: Alert, oriented x3, resting comfortably in no acute distress Respiratory: Lungs clear to auscultation bilaterally with normal respiratory effort; no w/r/r Cardiovascular: Regular rate and rhythm w/o m/r/g Abdomen: Soft, nontender, nondistended. Positive bowel sounds  Data Reviewed:  Lab Results  Component Value Date   WBC 6.5 07/15/2023   HGB 8.0 (L) 07/15/2023   HCT 24.0 (L) 07/15/2023   MCV 125.7 (H) 07/15/2023   PLT 152 07/15/2023   Lab Results  Component Value Date   GLUCOSE 73 07/15/2023   CALCIUM  8.9 07/15/2023   NA 134 (L) 07/15/2023   K 5.2 (H) 07/15/2023   CO2 14 (L) 07/15/2023   CL 107 07/15/2023   BUN 34 (H) 07/15/2023   CREATININE 1.94 (H) 07/15/2023   Lab Results  Component Value Date   ALT 9 07/15/2023   AST 26 07/15/2023   ALKPHOS 68 07/15/2023   BILITOT 0.6 07/15/2023   Lab  Results  Component Value Date   INR 1.3 (H) 07/15/2023   INR 1.29 02/15/2018   INR 1.11 07/20/2009    Radiology: CT Head Wo Contrast Result Date: 07/15/2023 CLINICAL DATA:  Head trauma, altered mental status (Ped 0-17y) had a fall, hx of etoh use, fatigue, EXAM: CT HEAD WITHOUT CONTRAST TECHNIQUE: Contiguous axial images were obtained from the base of the skull through the vertex without intravenous contrast. RADIATION DOSE REDUCTION: This exam was performed according to the departmental dose-optimization program which includes automated exposure control, adjustment of the mA and/or kV according to patient size and/or use of iterative reconstruction technique. COMPARISON:  February 05, 2009 FINDINGS: Brain: Proportional prominence of the ventricles and sulci, consistent with diffuse cerebral parenchymal volume loss, which has significantly progressed in the interim. The ventricles otherwise maintained midline position without midline shift. periventricular white matter hypoattenuation, most consistent with changes of mild-to-moderate  chronic ischemic microvascular disease. No evidence of acute territorial infarction, extra-axial fluid collection, hemorrhage, or mass lesion. The basilar cisterns are patent without downward herniation. The cerebellar hemispheres and vermis are well formed without mass lesion. 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 No cerebellar tonsillar ectopia greater than 5 mm. Vascular: No hyperdense vessel. Skull: Normal. Negative for fracture or focal lesion. Sinuses/Orbits: The paranasal sinuses and mastoids are clear. The globes appear intact. No retrobulbar hematoma. Other: None. IMPRESSION: 1. No acute intracranial abnormality, specifically, no acute hemorrhage, territorial infarction, or intracranial mass. 2. Significantly progressed cerebral volume loss with sequelae of chronic ischemic microvascular disease. Electronically Signed   By: Rance Burrows M.D.   On:  07/15/2023 17:43   CT ABDOMEN PELVIS W CONTRAST Result Date: 07/15/2023 CLINICAL DATA:  Right lower quadrant abdominal pain EXAM: CT ABDOMEN AND PELVIS WITH CONTRAST TECHNIQUE: Multidetector CT imaging of the abdomen and pelvis was performed using the standard protocol following bolus administration of intravenous contrast. RADIATION DOSE REDUCTION: This exam was performed according to the departmental dose-optimization program which includes automated exposure control, adjustment of the mA and/or kV according to patient size and/or use of iterative reconstruction technique. CONTRAST:  75mL OMNIPAQUE  IOHEXOL  300 MG/ML  SOLN COMPARISON:  January 22, 2023 FINDINGS: Lower chest: No acute abnormality. Hepatobiliary: Heterogeneous nodular appearing liver could correlate with hepatocellular disease such as cirrhosis without parenchymal lesions or biliary dilatation. Multiple gallstones without gallbladder inflammatory changes. Small amount of fluid surrounding the liver correlates with minimal ascites. Portal vein is patent. No obvious varices. Pancreas: Unremarkable. No pancreatic ductal dilatation or surrounding inflammatory changes. Spleen: Normal in size without focal abnormality. Adrenals/Urinary Tract: Right kidney is normal. Adrenal glands are normal. Left kidney demonstrates a large calcification in the left renal pelvis measuring 1.4 x 1.4 cm without hydronephrosis consistent with left nephrolithiasis. And loosely formed staghorn calculus. Bladder unremarkable. Prostate gland within normal limits. Stomach/Bowel: Mucosal thickening of the cecum and ascending colon with subtle stranding of the mesenteric fat in the right lower quadrant with normal-appearing cecal appendix could correlate with colitis. Mild diffuse diverticulosis without diverticulitis Vascular/Lymphatic: Aortic atherosclerosis. No enlarged abdominal or pelvic lymph nodes. Reproductive: Prostate is unremarkable. Other: No abdominal wall hernia or  abnormality. No abdominopelvic ascites. Musculoskeletal: No acute or significant osseous findings. IMPRESSION: *Mucosal thickening of the cecum and ascending colon with subtle stranding of the mesenteric fat in the right lower quadrant with normal-appearing cecal appendix could correlate with colitis. *Heterogeneous nodular appearing liver could correlate with hepatocellular disease such as cirrhosis without parenchymal lesions or biliary dilatation. *Multiple gallstones without gallbladder inflammatory changes. *Small amount of fluid surrounding the liver correlates with minimal ascites. *Left nephrolithiasis without hydronephrosis. *Aortic atherosclerosis. Electronically Signed   By: Fredrich Jefferson M.D.   On: 07/15/2023 17:29   DG Chest 2 View Result Date: 07/15/2023 CLINICAL DATA:  May 6 2 in 25 EXAM: CHEST - 2 VIEW COMPARISON:  None Available. FINDINGS: The heart size and mediastinal contours are within normal limits. Both lungs are clear. The visualized skeletal structures are unremarkable. No change in the nodular density of the left upper lobe compared with prior examination earlier today. Correlation with CT chest recommended. IMPRESSION: *No acute cardiopulmonary process. *No change in the nodular density of the left upper lobe compared with prior examination earlier today. Correlation with CT chest recommended. Electronically Signed   By: Fredrich Jefferson M.D.   On: 07/15/2023 17:25     Assessment and Plan: 69M h/op alcoholic liver disease,  anemia, paroxysmal A-fib not on anticoagulation due to presumed alcohol use, BPH, COPD, GERD, history of GI bleed, hypertension, prediabetes presented to the ED with complaints of generalized weakness/fatigue and poor p.o. intake c/w FTT iso thickened cecum c/f infection vs malignancy on imaging.   #FTT Pt with fatigue and poor PO intake, as well as low albumin -PT consulted; apprec resc -SLP consulted; recs: pending -Nutrition consulted; recs: pending -MIVF:  NS at 40cc/h for 24h -F/u calorie count  #Thickened cecum #Presumed colitis Pt admitted with dramatic weight loss and thickened cecum. NO h/o CSY. CT demonstrated "Mucosal thickening of the cecum and ascending colon with subtle stranding of the mesenteric fat in the right lower quadrant with normal-appearing cecal appendix could correlate with colitis" -GI consulted; apprec recs -Continue IV CTX and flagyl for now -F/u blood cultures x2  #AKI Cr 1.9 on admission. Baseline 1.0. Presumed pre-renal. -MIVF per above -Daily BMP -Strict I&Os and daily weights (standing preferred) -Renally dose medications for CrCl -Avoid lovenox , NSAIDs, morphine , Fleet's phosphate enema, regular insulin, contrast; no gadolinium for MRI to avoid nephrogenic systemic fibrosis -Consider renal US  and nephrology consult if worsening AKI or lack of improvement  #Macrocytic anemia -F/u vitamin B12 and folate; supplement prn   Advance Care Planning:   Code Status: Full Code   Consults: GI  Family Communication: N/A  Severity of Illness: The appropriate patient status for this patient is INPATIENT. Inpatient status is judged to be reasonable and necessary in order to provide the required intensity of service to ensure the patient's safety. The patient's presenting symptoms, physical exam findings, and initial radiographic and laboratory data in the context of their chronic comorbidities is felt to place them at high risk for further clinical deterioration. Furthermore, it is not anticipated that the patient will be medically stable for discharge from the hospital within 2 midnights of admission.   * I certify that at the point of admission it is my clinical judgment that the patient will require inpatient hospital care spanning beyond 2 midnights from the point of admission due to high intensity of service, high risk for further deterioration and high frequency of surveillance required.*   ------- I spent 55  minutes reviewing previous labs/notes, obtaining separate history at the bedside, counseling/discussing the treatment plan outlined above, ordering medications/tests, and performing clinical documentation.  Author: Arne Langdon, MD 07/16/2023 9:45 AM  For on call review [email or web address].

## 2023-07-16 NOTE — ED Notes (Signed)
 Pt states last drink was yesterday afternoon

## 2023-07-16 NOTE — ED Notes (Signed)
-  Called [facility] at 644am for transportation to [facility] per EDP.

## 2023-07-16 NOTE — ED Notes (Addendum)
 Left voicemail with spouse, Delmus Ferri to update her on patients transfer. Informed her of ED phone number for an update.

## 2023-07-16 NOTE — Progress Notes (Signed)
 SLP Cancellation Note  Patient Details Name: LITTLETON IBARA MRN: [id number] DOB: Sep 28, 1950   Cancelled treatment:       Reason Eval/Treat Not Completed: Patient not medically ready. Pt in ED, on clear liquids and possible colonoscopy tomorrow. Full SLP eval when pt medically cleared to consume PO given that purpose of order is to investigate potential reasons for poor intake.    Hope Brandenburger, Hardin Leys 07/16/2023, 1:26 PM

## 2023-07-16 NOTE — ED Triage Notes (Signed)
 In and out of ED with gen weakness and rectal abscess.

## 2023-07-16 NOTE — Consult Note (Addendum)
 Attending physician's note   I have taken a history, reviewed the chart, and examined the patient. I performed a substantive portion of this encounter, including complete performance of at least one of the key components, in conjunction with the APP. I agree with the APP's note, impression, and recommendations with my edits.   73 year old male with medical history as outlined below reports abdominal pain, nausea/vomiting, and 30 pound weight loss over the last 5 months or so.  Has also been having intermittent BRBPR mixed in stool.  History of EtOH cirrhosis.  Patient is a poor historian and provides scattered, limited history.  Admission evaluation notable for the following: - CT: Mucosal thickening in the cecum and ascending colon with subtle mesenteric stranding in the RLQ, heterogenous nodular liver, gallstones without cholecystitis - CT head: No acute intracranial pathology - H/H 7.7/23.5 (baseline Hgb appears to be ~8.5-9.5).  MCV 125 - Normal lipase - ESR 50, CRP normal - Albumin 2.6, T. bili 0.6, normal liver enzymes - BUN/creatinine 34/1.94, NA 134, K5.2, CO2 14 - PT/INR 16.7/1.3 - B12 >3300 - Normal lactate, ammonia, troponin  1) Abdominal pain 2) Nausea/vomiting 3) Weight loss 4) Weakness/Fatigue 5) Colonic wall thickening on CT Given comorbidities, progressive symptoms, plan for expedited inpatient evaluation for vague abdominal/GI symptoms - EGD/colonoscopy to evaluate for mucosal extraluminal pathology - CLD today, bowel prep this evening, n.p.o. at midnight  6) Hematochezia 7) Acute on chronic macrocytic anemia Reports episodic BRB mixed in stool.  Plan to evaluate with EGD/colonoscopy tomorrow  8) GERD with history of erosive esophagitis -Started on high-dose PPI-started on high-dose PPI on admission - Evaluate further at time of EGD as above  9) EtOH cirrhosis - MELD 17 - Will evaluate for esophageal varices screening, portal hypertensive gastropathy, etc.  to have EGD - Was started on ceftriaxone /Flagyl on admission - Minimal perihepatic ascites on admission CT  The indications, risks, and benefits of EGD and colonoscopy were explained to the patient along with family member at bedside in detail. Risks include but are not limited to bleeding, perforation, adverse reaction to medications, and cardiopulmonary compromise. Sequelae include but are not limited to the possibility of surgery, hospitalization, and mortality. The patient verbalized understanding and wished to proceed. All questions answered.    [address], DO, FACG [phone] office         Consultation  Referring Provider: TRH/ MooreMD Primary Care Physician:  Benuel Brazier, MD Primary Gastroenterologist: Nobie Batch 2024.  Reason for Consultation: Weight loss, failure to thrive, abnormal CT with right lower quadrant inflammatory process  HPI: Marvin Ortega is a 73 y.o. male, who presented to the emergency room last night after he had been back and forth to a couple of ERs over the past 24 hours but had previously left without being evaluated. He presented with concerns about an abscess, he is a poor historian, has been feeling weakness and fatigue recently.  He is vague about GI symptoms but does admit to some abdominal discomfort, has been having intermittent nausea and vomiting and has lost about 30 pounds since the beginning of the year.  He says he is able to have bowel movements but has noticed some bright red blood mixed with his bowel movements off and on.  He denies any fever or chills. Patient of his history of EtOH related cirrhosis, continues to actively drink but says he has not had any in the past 5 days because he has been feeling so bad.  He has been evaluated by [facility] in the past.  Per notes from March 2024 at which time he was being scheduled for a colonoscopy it was noted that he does have history of adenomatous colon polyps at prior  colonoscopy in 2016 which also showed pandiverticulosis. 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  Labs here on arrival with WBC of 6.5/hemoglobin 8/hematocrit 24.0/MCV 125 Potassium 5.2/BUN 34/creatinine 1.94 LFTs within normal limits Albumin 2.6 Lactate 1.7 UA abnormal culture pending BNP 999 INR 1.3  Today hemoglobin 7.7/hematocrit 23.5/MCV 8127  CT of the abdomen and pelvis shows nodular liver consistent with cirrhosis, multiple gallstones, very minimal ascites around the liver, there is mucosal wall thickening of the cecum and ascending colon with subtle stranding of the mesenteric fat, normal-appearing appendix.  MELD 3.0: 17 at 07/16/2023  9:20 AM MELD-Na: 17 at 07/16/2023  9:20 AM Calculated from: Serum Creatinine: 1.6 mg/dL at 05/10/4399  0:27 AM Serum Sodium: 134 mmol/L at 07/15/2023  4:13 PM Total Bilirubin: 0.6 mg/dL (Using min of 1 mg/dL) at 04/14/3662  4:03 PM Serum Albumin: 2.6 g/dL at 06/14/4257  5:63 PM INR(ratio): 1.3 at 07/15/2023  4:13 PM Age at listing (hypothetical): 46 years Sex: Male at 07/16/2023  9:20 AM     Past Medical History:  Diagnosis Date   Alcoholic cirrhosis of liver (HCC)    Anemia    Atrial fibrillation (HCC)    Atrial fibrillation (HCC)    BPH (benign prostatic hyperplasia)    COPD (chronic obstructive pulmonary disease) (HCC)    Dieulafoy lesion of stomach    GERD (gastroesophageal reflux disease)    h/o   GIB (gastrointestinal bleeding) 02/15/2018   Hemorrhagic shock (HCC)    History of kidney stones    Hypertension    Pre-diabetes    Sepsis (HCC)    Upper GI bleed     Past Surgical History:  Procedure Laterality Date   CYSTOSCOPY W/ RETROGRADES Right 11/23/2018   Procedure: CYSTOSCOPY WITH RETROGRADE PYELOGRAM;   Surgeon: Geraline Knapp, MD;  Location: [facility];  Service: Urology;  Laterality: Right;   CYSTOSCOPY WITH HOLMIUM LASER LITHOTRIPSY  11/30/2018   Procedure: CYSTOSCOPY WITH HOLMIUM LASER LITHOTRIPSY;  Surgeon: Geraline Knapp, MD;  Location: [facility];  Service: Urology;;   CYSTOSCOPY WITH LITHOLAPAXY N/A 11/23/2018   Procedure: CYSTOSCOPY WITH LITHOLAPAXY;  Surgeon: Geraline Knapp, MD;  Location: [facility];  Service: Urology;  Laterality: N/A;   CYSTOSCOPY WITH LITHOLAPAXY N/A 11/30/2018   Procedure: CYSTOSCOPY WITH LITHOLAPAXY;  Surgeon: Geraline Knapp, MD;  Location: [facility];  Service: Urology;  Laterality: N/A;   CYSTOSCOPY/URETEROSCOPY/HOLMIUM LASER/STENT PLACEMENT Right 11/23/2018   Procedure: CYSTOSCOPY/URETEROSCOPY/HOLMIUM LASER/STENT PLACEMENT;  Surgeon: Geraline Knapp, MD;  Location: [facility];  Service: Urology;  Laterality: Right;   ESOPHAGOGASTRODUODENOSCOPY N/A 02/15/2018   Procedure: ESOPHAGOGASTRODUODENOSCOPY (EGD);  Surgeon: Selena Daily, MD;  Location: [facility];  Service: Gastroenterology;  Laterality: N/A;   TRANSURETHRAL RESECTION OF PROSTATE N/A 05/24/2019   Procedure: TRANSURETHRAL RESECTION OF THE PROSTATE (TURP);  Surgeon: Geraline Knapp, MD;  Location: [facility];  Service: Urology;  Laterality: N/A;    Prior to Admission medications   Medication Sig Start Date End Date Taking? Authorizing Provider  albuterol  (VENTOLIN  HFA) 108 (90 Base) MCG/ACT inhaler Inhale 2 puffs into  the lungs every 6 (six) hours as needed for wheezing or shortness of breath. 11/07/20  Yes Marc Senior, MD  aspirin  EC 81 MG tablet Take 81 mg by mouth daily.   Yes [provider]  atorvastatin  (LIPITOR) 20 MG tablet Take 1 tablet by mouth daily. 08/02/20 07/16/23 Yes [provider]  cyanocobalamin (VITAMIN B12) 1000 MCG tablet Take 1,000 mcg by mouth daily.   Yes [provider]  finasteride  (PROSCAR ) 5 MG tablet Take 5 mg by mouth every morning.    Yes  [provider]  metoprolol  succinate (TOPROL -XL) 25 MG 24 hr tablet Take 25 mg by mouth daily. 03/25/23  Yes [provider]  pantoprazole  (PROTONIX ) 40 MG tablet Take 40 mg by mouth daily.   Yes [provider]  spironolactone  (ALDACTONE ) 50 MG tablet Take 50 mg by mouth daily. 03/25/23  Yes [provider]    Current Facility-Administered Medications  Medication Dose Route Frequency Provider Last Rate Last Admin   0.9 %  sodium chloride  infusion   Intravenous Continuous Arne Langdon, MD 40 mL/hr at 07/16/23 1052 New Bag at 07/16/23 1052   albuterol  (PROVENTIL ) (2.5 MG/3ML) 0.083% nebulizer solution 2.5 mL  2.5 mL Inhalation Q6H PRN Arne Langdon, MD       atorvastatin  (LIPITOR) tablet 20 mg  20 mg Oral Daily Arne Langdon, MD   20 mg at 07/16/23 1004   cefTRIAXone  (ROCEPHIN ) 2 g in sodium chloride  0.9 % 100 mL IVPB  2 g Intravenous Daily Moore, Willie, MD       cyanocobalamin (VITAMIN B12) tablet 1,000 mcg  1,000 mcg Oral Daily Arne Langdon, MD   1,000 mcg at 07/16/23 1004   enoxaparin  (LOVENOX ) injection 30 mg  30 mg Subcutaneous Q24H Arne Langdon, MD   30 mg at 07/16/23 1004   finasteride  (PROSCAR ) tablet 5 mg  5 mg Oral q morning Arne Langdon, MD   5 mg at 07/16/23 1004   metoprolol  succinate (TOPROL -XL) 24 hr tablet 25 mg  25 mg Oral Daily Arne Langdon, MD   25 mg at 07/16/23 1004   metroNIDAZOLE (FLAGYL) tablet 500 mg  500 mg Oral Q12H Arne Langdon, MD   500 mg at 07/16/23 1004   pantoprazole  (PROTONIX ) EC tablet 40 mg  40 mg Oral Daily Moore, Willie, MD   40 mg at 07/16/23 1004   Current Outpatient Medications  Medication Sig Dispense Refill   albuterol  (VENTOLIN  HFA) 108 (90 Base) MCG/ACT inhaler Inhale 2 puffs into the lungs every 6 (six) hours as needed for wheezing or shortness of breath. 8 g 2   aspirin  EC 81 MG tablet Take 81 mg by mouth daily.     atorvastatin  (LIPITOR) 20 MG tablet Take 1 tablet by mouth daily.     cyanocobalamin  (VITAMIN B12) 1000 MCG tablet Take 1,000 mcg by mouth daily.     finasteride  (PROSCAR ) 5 MG tablet Take 5 mg by mouth every morning.      metoprolol  succinate (TOPROL -XL) 25 MG 24 hr tablet Take 25 mg by mouth daily.     pantoprazole  (PROTONIX ) 40 MG tablet Take 40 mg by mouth daily.     spironolactone  (ALDACTONE ) 50 MG tablet Take 50 mg by mouth daily.      Allergies as of 07/15/2023   (No Known Allergies)    Family History  Problem Relation Age of Onset   Heart disease Father    Aneurysm Sister     Social History   Socioeconomic  History   Marital status: Divorced    Spouse name: Not on file   Number of children: Not on file   Years of education: Not on file   Highest education level: Not on file  Occupational History   Not on file  Tobacco Use   Smoking status: Never   Smokeless tobacco: Never  Vaping Use   Vaping status: Never Used  Substance and Sexual Activity   Alcohol use: Not Currently   Drug use: Never   Sexual activity: Yes    Birth control/protection: None  Other Topics Concern   Not on file  Social History Narrative   Not on file   Social Drivers of Health   Financial Resource Strain: Not on file  Food Insecurity: Not on file  Transportation Needs: Not on file  Physical Activity: Not on file  Stress: Not on file  Social Connections: Not on file  Intimate Partner Violence: Not on file    Review of Systems: Pertinent positive and negative review of systems were noted in the above HPI section.  All other review of systems was otherwise negative.   Physical Exam: Vital signs in last 24 hours: Temp:  [97.9 F (36.6 C)-98.3 F (36.8 C)] 98.1 F (36.7 C) (05/08 0825) Pulse Rate:  [65-92] 87 (05/08 1130) Resp:  [8-22] 10 (05/08 1130) BP: (88-111)/(61-89) 106/74 (05/08 1130) SpO2:  [95 %-100 %] 100 % (05/08 1130) Weight:  [47 kg] 47 kg (05/08 0819) Last BM Date : 07/15/23 General:   Alert,  Well-developed, thin frail elderly African-American male  pleasant and cooperative in NAD Head:  Normocephalic and atraumatic. Eyes:  Sclera clear, no icterus.   Conjunctiva pale Ears:  Normal auditory acuity. Nose:  No deformity, discharge,  or lesions. Mouth:  No deformity or lesions.   Neck:  Supple; no masses or thyromegaly. Lungs:  Clear throughout to auscultation.   No wheezes, crackles, or rhonchi.  Heart:  irRegular rate and rhythm; no murmurs, clicks, rubs,  or gallops. Abdomen:  Soft, he is tender in the right lower quadrant, some guarding no rebound, BS active,nonpalp mass or hsm, no appreciable fluid wave Rectal: External exam, external hemorrhoid present, no evidence of perirectal abscess or "knot" Msk:  Symmetrical without gross deformities. . Pulses:  Normal pulses noted. Extremities:  Without clubbing or edema. Neurologic:  Alert and  oriented x4;  grossly normal neurologically. Skin:  Intact without significant lesions or rashes.. Psych:  Alert and cooperative. Normal mood and affect.  Intake/Output from previous day: 05/07 0701 - 05/08 0700 In: 1200 [IV Piggyback:1200] Out: 200 [Urine:200] Intake/Output this shift: Total I/O In: 480 [P.O.:480] Out: -   Lab Results: Recent Labs    07/14/23 1317 07/15/23 1613 07/16/23 0920  WBC 7.9 6.5 5.5  HGB 9.3* 8.0* 7.7*  HCT 28.6* 24.0* 23.5*  PLT 187 152 153   BMET Recent Labs    07/14/23 1317 07/15/23 1613 07/16/23 0920  NA 132* 134*  --   K 5.4* 5.2*  --   CL 109 107  --   CO2 15* 14*  --   GLUCOSE 68* 73  --   BUN 33* 34*  --   CREATININE 2.12* 1.94* 1.60*  CALCIUM  8.7* 8.9  --    LFT Recent Labs    07/15/23 1613  PROT 6.3*  ALBUMIN 2.6*  AST 26  ALT 9  ALKPHOS 68  BILITOT 0.6   PT/INR Recent Labs    07/15/23 1613  LABPROT 16.7*  INR  1.3*       IMPRESSION:  #8 73 year old African-American male, alcoholic with EtOH induced cirrhosis, MEL D NA=17 Patient presents with complaints of weakness and fatigue, weight loss of 30 pounds over the  past several months intermittent vomiting and admits to seeing blood in his stools intermittently.  CT of the abdomen and pelvis confirmed cirrhosis, multiple gallstones, no significant ascites and mucosal thickening of the cecum and ascending colon with subtle stranding of the mesenteric fat.  Is not clear whether the CT finding is responsible for his recent symptoms are not.  Sometimes colonic wall thickening can be seen in setting of cirrhosis/portal hypertension, however he is tender in this area, and will also need to consider neoplasm, versus IBD, versus other.  #2 anemia macrocytic #3 low albuminemia #4.  History of atrial fibrillation-not on anticoagulation #5.  Hypertension #6.  COPD #7 history of adenomatous colon polyps and previously documented pandiverticulosis last colonoscopy 2016 Previous EGD with grade D esophagitis 2019 no esophageal varices, he did have a bleeding Dieulafot lesion at that time  PLAN: Clear liquid diet Continue to trend labs, add CRP and sed rate, AFP He will need colonoscopy, we may be able to schedule this for tomorrow, if able to get him on the schedule for tomorrow then will proceed with bowel prep this evening.  This was discussed with the patient in detail today at bedside including indications risks and benefits.  He is agreeable to proceed. Will discuss with Dr. Karene Oto regarding adding on for screening EGD.  GI will follow with you   Amy EsterwoodPA-C  07/16/2023, 11:44 AM

## 2023-07-17 ENCOUNTER — Inpatient Hospital Stay (HOSPITAL_COMMUNITY)

## 2023-07-17 DIAGNOSIS — R627 Adult failure to thrive: Secondary | ICD-10-CM | POA: Diagnosis present

## 2023-07-17 DIAGNOSIS — K7031 Alcoholic cirrhosis of liver with ascites: Secondary | ICD-10-CM | POA: Diagnosis present

## 2023-07-17 DIAGNOSIS — K263 Acute duodenal ulcer without hemorrhage or perforation: Secondary | ICD-10-CM | POA: Diagnosis present

## 2023-07-17 DIAGNOSIS — D62 Acute posthemorrhagic anemia: Secondary | ICD-10-CM | POA: Diagnosis not present

## 2023-07-17 DIAGNOSIS — K2900 Acute gastritis without bleeding: Secondary | ICD-10-CM | POA: Diagnosis not present

## 2023-07-17 DIAGNOSIS — K921 Melena: Secondary | ICD-10-CM | POA: Diagnosis not present

## 2023-07-17 DIAGNOSIS — E43 Unspecified severe protein-calorie malnutrition: Secondary | ICD-10-CM | POA: Diagnosis present

## 2023-07-17 DIAGNOSIS — I480 Paroxysmal atrial fibrillation: Secondary | ICD-10-CM | POA: Diagnosis present

## 2023-07-17 DIAGNOSIS — N179 Acute kidney failure, unspecified: Secondary | ICD-10-CM | POA: Diagnosis present

## 2023-07-17 DIAGNOSIS — K2990 Gastroduodenitis, unspecified, without bleeding: Secondary | ICD-10-CM | POA: Diagnosis present

## 2023-07-17 DIAGNOSIS — D539 Nutritional anemia, unspecified: Secondary | ICD-10-CM | POA: Diagnosis present

## 2023-07-17 DIAGNOSIS — R1030 Lower abdominal pain, unspecified: Secondary | ICD-10-CM

## 2023-07-17 DIAGNOSIS — K219 Gastro-esophageal reflux disease without esophagitis: Secondary | ICD-10-CM | POA: Diagnosis present

## 2023-07-17 DIAGNOSIS — K641 Second degree hemorrhoids: Secondary | ICD-10-CM | POA: Diagnosis present

## 2023-07-17 DIAGNOSIS — K449 Diaphragmatic hernia without obstruction or gangrene: Secondary | ICD-10-CM | POA: Diagnosis not present

## 2023-07-17 DIAGNOSIS — E871 Hypo-osmolality and hyponatremia: Secondary | ICD-10-CM | POA: Diagnosis present

## 2023-07-17 DIAGNOSIS — J449 Chronic obstructive pulmonary disease, unspecified: Secondary | ICD-10-CM | POA: Diagnosis present

## 2023-07-17 DIAGNOSIS — E875 Hyperkalemia: Secondary | ICD-10-CM | POA: Diagnosis present

## 2023-07-17 DIAGNOSIS — R933 Abnormal findings on diagnostic imaging of other parts of digestive tract: Secondary | ICD-10-CM | POA: Diagnosis not present

## 2023-07-17 DIAGNOSIS — E8809 Other disorders of plasma-protein metabolism, not elsewhere classified: Secondary | ICD-10-CM | POA: Diagnosis present

## 2023-07-17 DIAGNOSIS — D649 Anemia, unspecified: Secondary | ICD-10-CM | POA: Diagnosis not present

## 2023-07-17 DIAGNOSIS — K269 Duodenal ulcer, unspecified as acute or chronic, without hemorrhage or perforation: Secondary | ICD-10-CM | POA: Diagnosis not present

## 2023-07-17 DIAGNOSIS — R634 Abnormal weight loss: Secondary | ICD-10-CM

## 2023-07-17 DIAGNOSIS — E872 Acidosis, unspecified: Secondary | ICD-10-CM | POA: Diagnosis present

## 2023-07-17 DIAGNOSIS — I129 Hypertensive chronic kidney disease with stage 1 through stage 4 chronic kidney disease, or unspecified chronic kidney disease: Secondary | ICD-10-CM | POA: Diagnosis present

## 2023-07-17 DIAGNOSIS — E785 Hyperlipidemia, unspecified: Secondary | ICD-10-CM | POA: Diagnosis present

## 2023-07-17 DIAGNOSIS — F1010 Alcohol abuse, uncomplicated: Secondary | ICD-10-CM | POA: Diagnosis present

## 2023-07-17 DIAGNOSIS — N390 Urinary tract infection, site not specified: Secondary | ICD-10-CM | POA: Diagnosis present

## 2023-07-17 DIAGNOSIS — I10 Essential (primary) hypertension: Secondary | ICD-10-CM | POA: Diagnosis not present

## 2023-07-17 DIAGNOSIS — K2960 Other gastritis without bleeding: Secondary | ICD-10-CM | POA: Diagnosis not present

## 2023-07-17 DIAGNOSIS — Z681 Body mass index (BMI) 19 or less, adult: Secondary | ICD-10-CM | POA: Diagnosis not present

## 2023-07-17 DIAGNOSIS — K625 Hemorrhage of anus and rectum: Secondary | ICD-10-CM | POA: Diagnosis not present

## 2023-07-17 DIAGNOSIS — N183 Chronic kidney disease, stage 3 unspecified: Secondary | ICD-10-CM | POA: Diagnosis present

## 2023-07-17 DIAGNOSIS — K5730 Diverticulosis of large intestine without perforation or abscess without bleeding: Secondary | ICD-10-CM | POA: Diagnosis not present

## 2023-07-17 DIAGNOSIS — R64 Cachexia: Secondary | ICD-10-CM | POA: Diagnosis present

## 2023-07-17 DIAGNOSIS — K529 Noninfective gastroenteritis and colitis, unspecified: Secondary | ICD-10-CM | POA: Diagnosis present

## 2023-07-17 LAB — COMPREHENSIVE METABOLIC PANEL WITH GFR
ALT: 10 U/L (ref 0–44)
AST: 23 U/L (ref 15–41)
Albumin: 1.8 g/dL — ABNORMAL LOW (ref 3.5–5.0)
Alkaline Phosphatase: 42 U/L (ref 38–126)
Anion gap: 7 (ref 5–15)
BUN: 22 mg/dL (ref 8–23)
CO2: 17 mmol/L — ABNORMAL LOW (ref 22–32)
Calcium: 8.3 mg/dL — ABNORMAL LOW (ref 8.9–10.3)
Chloride: 112 mmol/L — ABNORMAL HIGH (ref 98–111)
Creatinine, Ser: 1.5 mg/dL — ABNORMAL HIGH (ref 0.61–1.24)
GFR, Estimated: 49 mL/min — ABNORMAL LOW (ref >60)
Glucose, Bld: 71 mg/dL (ref 70–99)
Potassium: 4.3 mmol/L (ref 3.5–5.1)
Sodium: 136 mmol/L (ref 135–145)
Total Bilirubin: 0.9 mg/dL (ref 0.0–1.2)
Total Protein: 5.5 g/dL — ABNORMAL LOW (ref 6.5–8.1)

## 2023-07-17 LAB — CBC
HCT: 22.5 % — ABNORMAL LOW (ref 39.0–52.0)
Hemoglobin: 7.6 g/dL — ABNORMAL LOW (ref 13.0–17.0)
MCH: 42 pg — ABNORMAL HIGH (ref 26.0–34.0)
MCHC: 33.8 g/dL (ref 30.0–36.0)
MCV: 124.3 fL — ABNORMAL HIGH (ref 80.0–100.0)
Platelets: 138 10*3/uL — ABNORMAL LOW (ref 150–400)
RBC: 1.81 MIL/uL — ABNORMAL LOW (ref 4.22–5.81)
RDW: 13.4 % (ref 11.5–15.5)
WBC: 6 10*3/uL (ref 4.0–10.5)
nRBC: 0 % (ref 0.0–0.2)

## 2023-07-17 LAB — FOLATE: Folate: 6.6 ng/mL (ref >5.9)

## 2023-07-17 LAB — BRAIN NATRIURETIC PEPTIDE: B Natriuretic Peptide: 91.5 pg/mL (ref 0.0–100.0)

## 2023-07-17 LAB — RETICULOCYTES
Immature Retic Fract: 35.3 % — ABNORMAL HIGH (ref 2.3–15.9)
RBC.: 1.76 MIL/uL — ABNORMAL LOW (ref 4.22–5.81)
Retic Count, Absolute: 45.4 10*3/uL (ref 19.0–186.0)
Retic Ct Pct: 2.6 % (ref 0.4–3.1)

## 2023-07-17 LAB — T4, FREE: Free T4: 0.83 ng/dL (ref 0.61–1.12)

## 2023-07-17 LAB — PREPARE RBC (CROSSMATCH)

## 2023-07-17 LAB — SODIUM, URINE, RANDOM: Sodium, Ur: 82 mmol/L

## 2023-07-17 LAB — IRON AND TIBC
Iron: 57 ug/dL (ref 45–182)
Saturation Ratios: 33 % (ref 17.9–39.5)
TIBC: 175 ug/dL — ABNORMAL LOW (ref 250–450)
UIBC: 118 ug/dL

## 2023-07-17 LAB — CREATININE, URINE, RANDOM: Creatinine, Urine: 130 mg/dL

## 2023-07-17 LAB — FERRITIN: Ferritin: 168 ng/mL (ref 24–336)

## 2023-07-17 LAB — PROCALCITONIN: Procalcitonin: 0.34 ng/mL

## 2023-07-17 LAB — OSMOLALITY, URINE: Osmolality, Ur: 511 mosm/kg (ref 300–900)

## 2023-07-17 LAB — TSH: TSH: 2.521 u[IU]/mL (ref 0.350–4.500)

## 2023-07-17 LAB — MAGNESIUM: Magnesium: 1.3 mg/dL — ABNORMAL LOW (ref 1.7–2.4)

## 2023-07-17 LAB — VITAMIN B12: Vitamin B-12: 3026 pg/mL — ABNORMAL HIGH (ref 180–914)

## 2023-07-17 LAB — C-REACTIVE PROTEIN: CRP: 0.6 mg/dL (ref <1.0)

## 2023-07-17 LAB — CORTISOL: Cortisol, Plasma: 11.7 ug/dL

## 2023-07-17 LAB — OSMOLALITY: Osmolality: 290 mosm/kg (ref 275–295)

## 2023-07-17 MED ORDER — BOOST / RESOURCE BREEZE PO LIQD CUSTOM
1.0000 | Freq: Three times a day (TID) | ORAL | Status: DC
Start: 2023-07-17 — End: 2023-07-22
  Administered 2023-07-17 – 2023-07-21 (×8): 1 via ORAL

## 2023-07-17 MED ORDER — ADULT MULTIVITAMIN W/MINERALS CH
1.0000 | ORAL_TABLET | Freq: Every day | ORAL | Status: DC
  Administered 2023-07-17 – 2023-07-23 (×6): 1 via ORAL
  Filled 2023-07-17 (×6): qty 1

## 2023-07-17 MED ORDER — MAGNESIUM SULFATE 4 GM/100ML IV SOLN
4.0000 g | Freq: Once | INTRAVENOUS | Status: AC
  Administered 2023-07-17: 4 g via INTRAVENOUS
  Filled 2023-07-17: qty 100

## 2023-07-17 MED ORDER — ENSURE ENLIVE PO LIQD: 237.0000 mL | Freq: Two times a day (BID) | ORAL | Status: DC

## 2023-07-17 MED ORDER — PROSOURCE PLUS PO LIQD
30.0000 mL | Freq: Two times a day (BID) | ORAL | Status: DC
  Administered 2023-07-17: 30 mL via ORAL
  Filled 2023-07-17: qty 30

## 2023-07-17 MED ORDER — SODIUM CHLORIDE 0.9% IV SOLUTION
Freq: Once | INTRAVENOUS | Status: DC
Start: 2023-07-17 — End: 2023-07-17

## 2023-07-17 MED ORDER — NA SULFATE-K SULFATE-MG SULF 17.5-3.13-1.6 GM/177ML PO SOLN
0.5000 | Freq: Once | ORAL | Status: AC
  Administered 2023-07-17: 177 mL via ORAL
  Filled 2023-07-17: qty 1

## 2023-07-17 MED ORDER — NA SULFATE-K SULFATE-MG SULF 17.5-3.13-1.6 GM/177ML PO SOLN
0.5000 | Freq: Once | ORAL | Status: AC
  Administered 2023-07-17: 177 mL via ORAL

## 2023-07-17 MED ORDER — MIDODRINE HCL 5 MG PO TABS
10.0000 mg | ORAL_TABLET | Freq: Three times a day (TID) | ORAL | Status: DC
  Administered 2023-07-17 – 2023-07-23 (×18): 10 mg via ORAL
  Filled 2023-07-17 (×18): qty 2

## 2023-07-17 MED ORDER — THIAMINE MONONITRATE 100 MG PO TABS
100.0000 mg | ORAL_TABLET | Freq: Every day | ORAL | Status: DC
  Administered 2023-07-17 – 2023-07-19 (×2): 100 mg
  Filled 2023-07-17 (×2): qty 1

## 2023-07-17 MED ORDER — BISACODYL 5 MG PO TBEC
10.0000 mg | DELAYED_RELEASE_TABLET | Freq: Once | ORAL | Status: AC
  Administered 2023-07-17: 10 mg via ORAL
  Filled 2023-07-17: qty 2

## 2023-07-17 MED ORDER — PROSOURCE PLUS PO LIQD
30.0000 mL | Freq: Two times a day (BID) | ORAL | Status: DC
  Administered 2023-07-17 – 2023-07-23 (×10): 30 mL via ORAL
  Filled 2023-07-17 (×10): qty 30

## 2023-07-17 MED ORDER — ONDANSETRON HCL 4 MG/2ML IJ SOLN
4.0000 mg | Freq: Four times a day (QID) | INTRAMUSCULAR | Status: DC | PRN
  Administered 2023-07-17: 4 mg via INTRAVENOUS
  Filled 2023-07-17: qty 2

## 2023-07-17 NOTE — Progress Notes (Signed)
 Physical Therapy Treatment Patient Details Name: Marvin Ortega MRN: [id number] DOB: 11/11/1950 Today's Date: 07/17/2023   History of Present Illness 73 y.o. male presented 5/7 to the ED with complaints of generalized weakness/fatigue.  Failure to thrive and Thickened cecum , p resumed colitis.  PMH: alcoholic liver disease, anemia, paroxysmal A-fib not on anticoagulation due to presumed alcohol use, BPH, COPD, GERD, history of GI bleed, hypertension, prediabetes.    PT Comments  Feeling a bit unwell this afternoon. Lethargic initially, complaining of dizziness. Symptoms did not worsen with activity (SBP maintained >100 throughout session.) More alert with activity. Able to ambulate to restroom with CGA, and back to bed, alternating between hand held support and pushing IV pole. Declines to sit up in recliner at this time. Encouraged OOB as tolerated this evening. Patient will continue to benefit from skilled physical therapy services to further improve independence with functional mobility.     If plan is discharge home, recommend the following: A little help with walking and/or transfers;Assistance with cooking/housework;Assist for transportation   Can travel by private vehicle        Equipment Recommendations  None recommended by PT    Recommendations for Other Services       Precautions / Restrictions Precautions Precautions: Fall Recall of Precautions/Restrictions: Impaired Restrictions Weight Bearing Restrictions Per Provider Order: No     Mobility  Bed Mobility Overal bed mobility: Needs Assistance Bed Mobility: Supine to Sit, Sit to Supine     Supine to sit: Supervision Sit to supine: Supervision   General bed mobility comments: Supervision for safety, requires veral cues to facilitate    Transfers Overall transfer level: Needs assistance Equipment used: None, 1 person hand held assist Transfers: Sit to/from Stand Sit to Stand: Contact guard assist            General transfer comment: CGA for safety to rise from bed, supervision from toilet with rail for support. Cues for technique. Dizziness no worse with transitions.    Ambulation/Gait Ambulation/Gait assistance: Contact guard assist Gait Distance (Feet): 15 Feet (x2) Assistive device: 1 person hand held assist, IV Pole Gait Pattern/deviations: Step-through pattern, Decreased stride length, Drifts right/left Gait velocity: dec Gait velocity interpretation: <1.31 ft/sec, indicative of household ambulator   General Gait Details: Minor sway, CGA for safety. Hand held assist to restroom, pushing IV pole back to bed. Declined further gait as he is not feeling well at this time.   Stairs             Wheelchair Mobility     Tilt Bed    Modified Rankin (Stroke Patients Only)       Balance Overall balance assessment: Needs assistance Sitting-balance support: No upper extremity supported, Feet supported Sitting balance-Leahy Scale: Good     Standing balance support: No upper extremity supported Standing balance-Leahy Scale: Fair Standing balance comment: increased sway without UE support.                            Communication Communication Communication: No apparent difficulties  Cognition Arousal: Lethargic Behavior During Therapy: WFL for tasks assessed/performed   PT - Cognitive impairments: No family/caregiver present to determine baseline                       PT - Cognition Comments: Delayed, not very conversant today. Requires cues to facilitate. More alert once upright. Following commands: Impaired Following commands impaired: Only follows one  step commands consistently, Follows one step commands with increased time    Cueing Cueing Techniques: Verbal cues  Exercises      General Comments General comments (skin integrity, edema, etc.): BP: supine 103/67 HR 80, seated 102/72 HR 83, end of session back in bed 110/72.      Pertinent  Vitals/Pain Pain Assessment Pain Assessment: Faces Pain Location: Plantar surface of BIL feet. Pain Descriptors / Indicators: Sharp Pain Intervention(s): Monitored during session    Home Living                          Prior Function            PT Goals (current goals can now be found in the care plan section) Acute Rehab PT Goals Patient Stated Goal: Get well PT Goal Formulation: With patient Time For Goal Achievement: 07/30/23 Potential to Achieve Goals: Good Progress towards PT goals: Progressing toward goals    Frequency    Min 2X/week      PT Plan      Co-evaluation              AM-PAC PT "6 Clicks" Mobility   Outcome Measure  Help needed turning from your back to your side while in a flat bed without using bedrails?: None Help needed moving from lying on your back to sitting on the side of a flat bed without using bedrails?: A Little Help needed moving to and from a bed to a chair (including a wheelchair)?: A Little Help needed standing up from a chair using your arms (e.g., wheelchair or bedside chair)?: A Little Help needed to walk in hospital room?: A Little Help needed climbing 3-5 steps with a railing? : A Lot 6 Click Score: 18    End of Session Equipment Utilized During Treatment: Gait belt Activity Tolerance: Patient tolerated treatment well Patient left: in bed;with call bell/phone within reach;with bed alarm set Nurse Communication: Mobility status (orthostatics) PT Visit Diagnosis: Unsteadiness on feet (R26.81);Other abnormalities of gait and mobility (R26.89);Repeated falls (R29.6);Muscle weakness (generalized) (M62.81);History of falling (Z91.81)     Time: 1610-9604 PT Time Calculation (min) (ACUTE ONLY): 28 min  Charges:    $Gait Training: 8-22 mins $Therapeutic Activity: 8-22 mins PT General Charges $$ ACUTE PT VISIT: 1 Visit                     Jory Ng, PT, DPT [facility]  Rehabilitation Services Physical  Therapist Office: [phone] Website: [email or web address]    Alinda Irani 07/17/2023, 4:24 PM

## 2023-07-17 NOTE — Care Management Obs Status (Signed)
 MEDICARE OBSERVATION STATUS NOTIFICATION   Patient Details  Name: Marvin Ortega MRN: [id number] Date of Birth: [birth year]   Medicare Observation Status Notification Given:  Yes  Moon/obs letter sign and copy given Wynonia Hedges 07/17/2023, 9:57 AM

## 2023-07-17 NOTE — Progress Notes (Signed)
 SLP Cancellation Note  Patient Details Name: Marvin Ortega MRN: [id number] DOB: Mar 01, 1951   Cancelled treatment:       Reason Eval/Treat Not Completed: Patient at procedure or test/unavailable (ultrasound). GI PA states EGD and colonoscopy are planned for next date. Also discussed with attending MD and will hold swallowing evaluation until pt's diet can be advanced to solids for further assessment of potential reasons for poor oral intake, which is the purpose of the consult.   Amil Kale, M.A., CCC-SLP Speech Language Pathology, Acute Rehabilitation Services  Secure Chat preferred [phone]  07/17/2023, 3:16 PM

## 2023-07-17 NOTE — Progress Notes (Addendum)
 Nutrition Follow-up  DOCUMENTATION CODES:   Severe malnutrition in context of chronic illness  INTERVENTION:  Add Boost Breeze po TID, each supplement provides 250 kcal and 9 grams of protein  Continue 30 ml ProSource Plus BID, each supplement provides 100 kcals and 15 grams protein.   Monitor diet advancement and tolerance Attempt calorie count Monitor magnesium , potassium, and phosphorus daily for at least 3 days Add Thiamine  100 mg daily for 5 days  Upon diet advancement, recommend modifying to Ensure Enlive and Magic Cup to provide more nutrition support than current regimen ordered  NUTRITION DIAGNOSIS:  Severe Malnutrition related to chronic illness (alcoholic liver disease, COPD, hx GIB, HTN) as evidenced by severe fat depletion, severe muscle depletion, percent weight loss.  GOAL:  Patient will meet greater than or equal to 90% of their needs  MONITOR:  PO intake, Supplement acceptance, Diet advancement, Weight trends, Skin  REASON FOR ASSESSMENT:  Consult Assessment of nutrition requirement/status, Calorie Count  ASSESSMENT:   Pt with PMH significant for: alcoholic liver disease, anemia, paroxysmal afib, BPH, COPD, GERD, hx of GIB, HTN, prediabetes. Presented to ED with c/o generalized weakness/fatigue, poor PO intake. Imaging suggestive of thickening of cecum. Colitis vs malignancy?  5/8 admitted 5/9 transfusion  Patient is cachectic in appearance and appears very frail. Planning for EGD/colonoscopy potentially over the weekend. This will likely impact ability to completed calorie count ordered by MD. Receiving transfusion today as well.   Average Meal Intake No documentation to review  Patient endorses a reduction in intake over the last few months related eating corresponding with frequent loose stools thereafter. Endorses blood in stools. No difficulties with chewing or swallowing. Poor dentition on exam. Recommend electrolyte monitoring as he is at risk for  refeeding.  24 Hour Recall B: grits, sausage OR cereal and milk L: skips D: hamburger w/ chips Snacks: rarely  Has experienced significant and unintentional weight loss in last 2-3 months. Patient UBW reported as around 140lbs two months ago. He is now down to 103lbs. This is a 26% weight loss and considered clinically significant for the time frame. Per chart review, he has shown 11.5% weight loss in last three months, which is also considered clinically significant for the time frame. No edema on exam.   Admit/ Current Weight: 47kg  He resided at home alone prior to discharge. Has experienced multiple falls. Required magnesium  repletion today.   Meds: cyanocobalamin , ABX, pantoprazole  Drips: IV ABX 4g Mg sulfate x1  Labs: Na+ 132>134>136 (wdl) K+ 5.4>5.2>4.3 (wdl) Mg 1.3 (L) Hgb 7.6 (L) TIBC 175 (L) Sed Rate 50 (H) CBGs 71-73 x24 hours  NUTRITION - FOCUSED PHYSICAL EXAM:  Flowsheet Row Most Recent Value  Orbital Region Moderate depletion  Upper Arm Region Severe depletion  Thoracic and Lumbar Region Severe depletion  Buccal Region Moderate depletion  Temple Region Moderate depletion  Clavicle Bone Region Severe depletion  Clavicle and Acromion Bone Region Severe depletion  Scapular Bone Region Severe depletion  Dorsal Hand Severe depletion  Patellar Region Severe depletion  Anterior Thigh Region Severe depletion  Posterior Calf Region Severe depletion  Edema (RD Assessment) None  Hair Reviewed  Mouth Reviewed  [poor dentition]  Skin Reviewed  Nails Reviewed    Diet Order:   Diet Order             Diet full liquid Room service appropriate? Yes with Assist; Fluid consistency: Thin  Diet effective now            EDUCATION  NEEDS:   Education needs have been addressed  Skin:  Skin Assessment: Reviewed RN Assessment  Last BM:  5/8  Height:  Ht Readings from Last 1 Encounters:  07/16/23 5\' 5"  (1.651 m)   Weight:  Wt Readings from Last 1 Encounters:   07/16/23 47 kg   Ideal Body Weight:     BMI:  Body mass index is 17.24 kg/m.  Estimated Nutritional Needs:   Kcal:  1500-1700 kcals  Protein:  75-90g  Fluid:  >1.5L/day  Con Decant MS, RD, LDN Registered Dietitian Clinical Nutrition RD Inpatient Contact Info in Amion

## 2023-07-17 NOTE — Progress Notes (Signed)
 PROGRESS NOTE                                                                                                                                                                                                             Patient Demographics:    Marvin Ortega, is a 73 y.o. male, DOB - [birth year], ZOX:[id number]  Outpatient Primary MD for the patient is Entzminger, Iantha Mainland, MD    LOS - 0  Admit date - 07/15/2023    Chief Complaint  Patient presents with   Fatigue       Brief Narrative (HPI from H&P)    73 y.o. male with medical history significant of  alcoholic liver disease, anemia, paroxysmal A-fib not on anticoagulation due to presumed alcohol use, BPH, COPD, GERD, history of GI bleed, hypertension, prediabetes presented to the ED with complaints of generalized weakness/fatigue and poor p.o. intake iso thickened cecum c/f infection vs malignancy on imaging.    Subjective:    Marvin Ortega today has, No headache, No chest pain, No abdominal pain - No Nausea, No new weakness tingling or numbness, no SOB   Assessment  & Plan :    Pt with fatigue and poor PO intake, as well as low albumin, appears frail and cachectic, so has significant anemia, imaging suggestive of thickening in the cecum colitis versus malignancy. Presentation suspicious for severe protein calorie malnutrition, rule out malignancy, GI on board, planning for EGD and colonoscopy he is overall hypotensive with significant anemia, will go ahead and transfuse 1 unit of packed RBC, question intermittent chronic occult blood loss.  Surprisingly his anemia panel appears stable for now.  Will add midodrine and gentle IV fluids for blood pressure support.   #Thickened cecum #Presumed colitis Please review above, GI on board, placed on Rocephin  and Flagyl  in case this is colitis.   #AKI Cr 1.9 on admission. Baseline 1.0. Presumed pre-renal.  Hydrate, transfuse,  check urine electrolytes and renal ultrasound and monitor.   #Macrocytic anemia - Able B12 and folate, check TSH.   Hypomagnesemia.  Replaced.  Severe PCM.  Protein supplement.  Dyslipidemia.  On statin.       Condition - Extremely Guarded  Family Communication  : Niece Gabriel John [phone]  over the phone on 07/17/2023 at 9:19 AM.  Message left  Code Status :  Full  Consults  :  GI  PUD Prophylaxis : PPI   Procedures  :     CT Head - Non acute  CT Abd Pelvis - Mucosal thickening of the cecum and ascending colon with subtle stranding of the mesenteric fat in the right lower quadrant with normal-appearing cecal appendix could correlate with colitis. *Heterogeneous nodular appearing liver could correlate with hepatocellular disease such as cirrhosis without parenchymal lesions or biliary dilatation. *Multiple gallstones without gallbladder inflammatory changes. *Small amount of fluid surrounding the liver correlates with minimal ascites. *Left nephrolithiasis without hydronephrosis. *Aortic atherosclerosis.      Disposition Plan  :    Status is: Observation   DVT Prophylaxis  :    SCDs  Lab Results  Component Value Date   PLT 138 (L) 07/17/2023    Diet :  Diet Order             Diet full liquid Room service appropriate? Yes with Assist; Fluid consistency: Thin  Diet effective now                    Inpatient Medications  Scheduled Meds:  atorvastatin   20 mg Oral Daily   cyanocobalamin   1,000 mcg Oral Daily   finasteride   5 mg Oral q morning   metoprolol  succinate  25 mg Oral Daily   metroNIDAZOLE   500 mg Oral Q12H   midodrine  10 mg Oral TID WC   pantoprazole   40 mg Oral Daily   Continuous Infusions:  sodium chloride  40 mL/hr at 07/17/23 0400   cefTRIAXone  (ROCEPHIN )  IV Stopped (07/16/23 1741)   PRN Meds:.albuterol   Antibiotics  :    Anti-infectives (From admission, onward)    Start     Dose/Rate Route Frequency Ordered Stop   07/16/23 1800   cefTRIAXone  (ROCEPHIN ) 2 g in sodium chloride  0.9 % 100 mL IVPB        2 g 200 mL/hr over 30 Minutes Intravenous Daily 07/16/23 0921     07/16/23 1000  metroNIDAZOLE  (FLAGYL ) tablet 500 mg        500 mg Oral Every 12 hours 07/16/23 0921     07/15/23 2100  metroNIDAZOLE  (FLAGYL ) IVPB 500 mg        500 mg 100 mL/hr over 60 Minutes Intravenous  Once 07/15/23 2059 07/15/23 2231   07/15/23 1730  cefTRIAXone  (ROCEPHIN ) 2 g in sodium chloride  0.9 % 100 mL IVPB        2 g 200 mL/hr over 30 Minutes Intravenous  Once 07/15/23 1727 07/15/23 1843         Objective:   Vitals:   07/16/23 1600 07/16/23 2006 07/16/23 2331 07/17/23 0401  BP: 97/66  91/65 (!) 87/60  Pulse: 86   81  Resp: 10   12  Temp:  98.1 F (36.7 C) 98.2 F (36.8 C) 98 F (36.7 C)  TempSrc:  Oral Oral Oral  SpO2: 100%   100%  Weight:      Height:        Wt Readings from Last 3 Encounters:  07/16/23 47 kg  07/14/23 48.2 kg  07/14/23 48.3 kg     Intake/Output Summary (Last 24 hours) at 07/17/2023 1610 Last data filed at 07/17/2023 0400 Gross per 24 hour  Intake 1208.09 ml  Output 350 ml  Net 858.09 ml     Physical Exam  Frail cachectic elderly African-American male, awake Alert, No new F.N deficits, Normal affect [place].AT,PERRAL Supple Neck, No JVD,   Symmetrical  Chest wall movement, Good air movement bilaterally, CTAB RRR,No Gallops,Rubs or new Murmurs,  +ve B.Sounds, Abd Soft, No tenderness,   No Cyanosis, Clubbing or edema      Data Review:    Recent Labs  Lab 07/14/23 1317 07/15/23 1613 07/16/23 0920 07/17/23 0621  WBC 7.9 6.5 5.5 6.0  HGB 9.3* 8.0* 7.7* 7.6*  HCT 28.6* 24.0* 23.5* 22.5*  PLT 187 152 153 138*  MCV 128.3* 125.7* 127.7* 124.3*  MCH 41.7* 41.9* 41.8* 42.0*  MCHC 32.5 33.3 32.8 33.8  RDW 13.2 13.2 13.3 13.4  LYMPHSABS  --  1.5  --   --   MONOABS  --  0.7  --   --   EOSABS  --  0.0  --   --   BASOSABS  --  0.0  --   --     Recent Labs  Lab 07/14/23 1317 07/15/23 1613  07/16/23 0920 07/16/23 1519 07/17/23 0621  NA 132* 134*  --   --  136  K 5.4* 5.2*  --   --  4.3  CL 109 107  --   --  112*  CO2 15* 14*  --   --  17*  ANIONGAP 8 13  --   --  7  GLUCOSE 68* 73  --   --  71  BUN 33* 34*  --   --  22  CREATININE 2.12* 1.94* 1.60*  --  1.50*  AST  --  26  --   --  23  ALT  --  9  --   --  10  ALKPHOS  --  68  --   --  42  BILITOT  --  0.6  --   --  0.9  ALBUMIN  --  2.6*  --   --  1.8*  CRP  --   --   --  0.8 0.6  LATICACIDVEN  --  1.7  --   --   --   INR  --  1.3*  --   --   --   AMMONIA  --  21  --   --   --   BNP  --   --   --   --  91.5  MG  --   --   --   --  1.3*  CALCIUM  8.7* 8.9  --   --  8.3*      Recent Labs  Lab 07/14/23 1317 07/15/23 1613 07/16/23 1519 07/17/23 0621  CRP  --   --  0.8 0.6  LATICACIDVEN  --  1.7  --   --   INR  --  1.3*  --   --   AMMONIA  --  21  --   --   BNP  --   --   --  91.5  MG  --   --   --  1.3*  CALCIUM  8.7* 8.9  --  8.3*    --------------------------------------------------------------------------------------------------------------- Lab Results  Component Value Date   CHOL 129 08/17/2012   HDL 91 (A) 08/17/2012   LDLCALC 24 08/17/2012   TRIG 70 08/17/2012   CHOLHDL 1.9 07/20/2009    Lab Results  Component Value Date   HGBA1C 6.0 (H) 07/20/2021   No results for input(s): "TSH", "T4TOTAL", "FREET4", "T3FREE", "THYROIDAB" in the last 72 hours. Recent Labs    07/16/23 1006 07/17/23 0621  VITAMINB12 3,394* 3,026*  FOLATE  --  6.6  FERRITIN  --  168  TIBC  --  175*  IRON  --  57  RETICCTPCT  --  2.6   ------------------------------------------------------------------------------------------------------------------ Cardiac Enzymes No results for input(s): "CKMB", "TROPONINI", "MYOGLOBIN" in the last 168 hours.  Invalid input(s): "CK"  Micro Results Recent Results (from the past 240 hours)  Blood culture (routine x 2)     Status: None (Preliminary result)   Collection Time:  07/15/23  4:05 PM   Specimen: BLOOD  Result Value Ref Range Status   Specimen Description   Final    BLOOD LEFT ANTECUBITAL Performed at [facility], [address], [place], [place] [address]    Special Requests   Final    Blood Culture adequate volume BOTTLES DRAWN AEROBIC AND ANAEROBIC Performed at [facility], [address], [place], [place] [address]    Culture   Final    NO GROWTH 2 DAYS Performed at [facility], [address], [place], [place] [address]    Report Status PENDING  Incomplete  Blood culture (routine x 2)     Status: None (Preliminary result)   Collection Time: 07/15/23  4:10 PM   Specimen: BLOOD  Result Value Ref Range Status   Specimen Description   Final    BLOOD RIGHT ANTECUBITAL Performed at [facility], [address] [place], [place] [address]    Special Requests   Final    Blood Culture adequate volume BOTTLES DRAWN AEROBIC AND ANAEROBIC Performed at [facility], [address], [place], [place] [address]    Culture   Final    NO GROWTH 2 DAYS Performed at [facility], [address], [place], [place] [address]    Report Status PENDING  Incomplete    Radiology Report CT Head Wo Contrast Result Date: 07/15/2023 CLINICAL DATA:  Head trauma, altered mental status (Ped 0-17y) had a fall, hx of etoh use, fatigue, EXAM: CT HEAD WITHOUT CONTRAST TECHNIQUE: Contiguous axial images were obtained from the base of the skull through the vertex without intravenous contrast. RADIATION DOSE REDUCTION: This exam was performed according to the departmental dose-optimization program which includes automated exposure control, adjustment of the mA and/or kV according to patient size and/or use of iterative reconstruction technique. COMPARISON:  February 05, 2009 FINDINGS: Brain: Proportional prominence of the ventricles and sulci, consistent with diffuse cerebral  parenchymal volume loss, which has significantly progressed in the interim. The ventricles otherwise maintained midline position without midline shift. 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 No evidence of acute territorial infarction, extra-axial fluid collection, hemorrhage, or mass lesion. The basilar cisterns are patent without downward herniation. The cerebellar hemispheres and vermis are well formed without mass lesion. 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 No cerebellar tonsillar ectopia greater than 5 mm. Vascular: No hyperdense vessel. Skull: Normal. Negative for fracture or focal lesion. Sinuses/Orbits: The paranasal sinuses and mastoids are clear. The globes appear intact. No retrobulbar hematoma. Other: None. IMPRESSION: 1. No acute intracranial abnormality, specifically, no acute hemorrhage, territorial infarction, or intracranial mass. 2. Significantly progressed cerebral volume loss with sequelae of chronic ischemic microvascular disease. Electronically Signed   By: Rance Burrows M.D.   On: 07/15/2023 17:43   CT ABDOMEN PELVIS W CONTRAST Result Date: 07/15/2023 CLINICAL DATA:  Right lower quadrant abdominal pain EXAM: CT ABDOMEN AND PELVIS WITH CONTRAST TECHNIQUE: Multidetector CT imaging of the abdomen and pelvis was performed using the standard protocol following bolus administration of intravenous  contrast. RADIATION DOSE REDUCTION: This exam was performed according to the departmental dose-optimization program which includes automated exposure control, adjustment of the mA and/or kV according to patient size and/or use of iterative reconstruction technique. CONTRAST:  75mL OMNIPAQUE  IOHEXOL  300 MG/ML  SOLN COMPARISON:  January 22, 2023 FINDINGS: Lower chest: No acute abnormality. Hepatobiliary: Heterogeneous nodular appearing liver could correlate with hepatocellular disease  such as cirrhosis without parenchymal lesions or biliary dilatation. Multiple gallstones without gallbladder inflammatory changes. Small amount of fluid surrounding the liver correlates with minimal ascites. Portal vein is patent. No obvious varices. Pancreas: Unremarkable. No pancreatic ductal dilatation or surrounding inflammatory changes. Spleen: Normal in size without focal abnormality. Adrenals/Urinary Tract: Right kidney is normal. Adrenal glands are normal. Left kidney demonstrates a large calcification in the left renal pelvis measuring 1.4 x 1.4 cm without hydronephrosis consistent with left nephrolithiasis. And loosely formed staghorn calculus. Bladder unremarkable. Prostate gland within normal limits. Stomach/Bowel: Mucosal thickening of the cecum and ascending colon with subtle stranding of the mesenteric fat in the right lower quadrant with normal-appearing cecal appendix could correlate with colitis. Mild diffuse diverticulosis without diverticulitis Vascular/Lymphatic: Aortic atherosclerosis. No enlarged abdominal or pelvic lymph nodes. Reproductive: Prostate is unremarkable. Other: No abdominal wall hernia or abnormality. No abdominopelvic ascites. Musculoskeletal: No acute or significant osseous findings. IMPRESSION: *Mucosal thickening of the cecum and ascending colon with subtle stranding of the mesenteric fat in the right lower quadrant with normal-appearing cecal appendix could correlate with colitis. *Heterogeneous nodular appearing liver could correlate with hepatocellular disease such as cirrhosis without parenchymal lesions or biliary dilatation. *Multiple gallstones without gallbladder inflammatory changes. *Small amount of fluid surrounding the liver correlates with minimal ascites. *Left nephrolithiasis without hydronephrosis. *Aortic atherosclerosis. Electronically Signed   By: Fredrich Jefferson M.D.   On: 07/15/2023 17:29   DG Chest 2 View Result Date: 07/15/2023 CLINICAL DATA:  May 6 2  in 25 EXAM: CHEST - 2 VIEW COMPARISON:  None Available. FINDINGS: The heart size and mediastinal contours are within normal limits. Both lungs are clear. The visualized skeletal structures are unremarkable. No change in the nodular density of the left upper lobe compared with prior examination earlier today. Correlation with CT chest recommended. IMPRESSION: *No acute cardiopulmonary process. *No change in the nodular density of the left upper lobe compared with prior examination earlier today. Correlation with CT chest recommended. Electronically Signed   By: Fredrich Jefferson M.D.   On: 07/15/2023 17:25     Signature  -   Lynnwood Sauer M.D on 07/17/2023 at 9:17 AM   -  To page go to [email or web address]

## 2023-07-17 NOTE — H&P (View-Only) (Signed)
 Patient ID: Marvin Ortega, male   DOB: 09-Sep-1950, 73 y.o.   MRN: [id number]     Attending physician's note   I have taken a history, reviewed the chart, and examined the patient. I performed a substantive portion of this encounter, including complete performance of at least one of the key components, in conjunction with the APP. I agree with the APP's note, impression, and recommendations with my edits.   Tentative plan for EGD/colonoscopy tomorrow provided he can tolerate bowel preparation.  BP stable this morning to start slow prep this afternoon. - CLD today - Prep this afternoon/evening and overnight - N.p.o. except medications and bowel preparation starting at midnight - Plan for EGD/colonoscopy with MAC tomorrow  Harry Lindau, DO, FACG [phone] office          Progress Note   Subjective   Day # 2 CC; weight loss, failure to thrive, abnormal CT with right lower quadrant/ascending colon inflammatory process, cirrhosis  Patient has no specific complaints today,  Renal ultrasound-minimal perihepatic and pelvic floor ascites nodular hepatic contour consistent with cirrhosis no acute abnormality of the kidneys 12 mm nonobstructing left renal stone  Labs today-WBC 6.0/hemoglobin 7.6/hematocrit 22.5/MCV 124/platelets 138 Potassium 4.3/BUN 22/creatinine 1.5 albumin 1.8 LFTs normal AFP pending CRP 0.6 BNP 91.5 B12 3026/folate within normal limits,'s ferritin 168     Objective   Vital signs in last 24 hours: Temp:  [97.7 F (36.5 C)-98.4 F (36.9 C)] 98.4 F (36.9 C) (05/09 1238) Pulse Rate:  [81-100] 85 (05/09 1238) Resp:  [10-19] 18 (05/09 1238) BP: (86-117)/(60-91) 89/60 (05/09 1238) SpO2:  [90 %-100 %] 100 % (05/09 0800) Last BM Date : 07/16/23 General:    Early African-American male in NAD Heart:  Regular rate and rhythm; no murmurs Lungs: Respirations even and unlabored, lungs CTA bilaterally Abdomen:  Soft, some tenderness in the lower abdomen,  right lower quadrant no guarding or rebound no palpable mass or hepatosplenomegaly. Extremities:  Without edema. Neurologic:  Alert and oriented,  grossly normal neurologically. Psych:  Cooperative. Normal mood and affect.  Intake/Output from previous day: 05/08 0701 - 05/09 0700 In: 1208.1 [P.O.:480; I.V.:626.8; IV Piggyback:101.3] Out: 350 [Urine:350] Intake/Output this shift: No intake/output data recorded.  Lab Results: Recent Labs    07/15/23 1613 07/16/23 0920 07/17/23 0621  WBC 6.5 5.5 6.0  HGB 8.0* 7.7* 7.6*  HCT 24.0* 23.5* 22.5*  PLT 152 153 138*   BMET Recent Labs    07/14/23 1317 07/15/23 1613 07/16/23 0920 07/17/23 0621  NA 132* 134*  --  136  K 5.4* 5.2*  --  4.3  CL 109 107  --  112*  CO2 15* 14*  --  17*  GLUCOSE 68* 73  --  71  BUN 33* 34*  --  22  CREATININE 2.12* 1.94* 1.60* 1.50*  CALCIUM  8.7* 8.9  --  8.3*   LFT Recent Labs    07/17/23 0621  PROT 5.5*  ALBUMIN 1.8*  AST 23  ALT 10  ALKPHOS 42  BILITOT 0.9   PT/INR Recent Labs    07/15/23 1613  LABPROT 16.7*  INR 1.3*    Studies/Results: US  RENAL Result Date: 07/17/2023 CLINICAL DATA:  Acute kidney injury EXAM: RENAL / URINARY TRACT ULTRASOUND COMPLETE COMPARISON:  CT abdomen pelvis 07/15/2023 FINDINGS: Right Kidney: Renal measurements: 9.4 x 5.2 x 4.5 cm = volume: 112 mL. Echogenicity within normal limits. No mass or hydronephrosis visualized. Left Kidney: Renal measurements: 10.0 x 4.2 x 4.5 cm =  volume: 96 mL. Echogenicity within normal limits. No mass or hydronephrosis visualized. 12 mm calculus seen in the lower pole. Bladder: Appears normal for degree of bladder distention. Other: Minimal perihepatic and pelvic floor ascites is present. Nodular patent contours consistent with cirrhosis. IMPRESSION: 1. No acute abnormality of the kidneys. 2. 12 mm nonobstructing left renal calculus. Electronically Signed   By: Elester Grim M.D.   On: 07/17/2023 11:37   CT Head Wo Contrast Result  Date: 07/15/2023 CLINICAL DATA:  Head trauma, altered mental status (Ped 0-17y) had a fall, hx of etoh use, fatigue, EXAM: CT HEAD WITHOUT CONTRAST TECHNIQUE: Contiguous axial images were obtained from the base of the skull through the vertex without intravenous contrast. RADIATION DOSE REDUCTION: This exam was performed according to the departmental dose-optimization program which includes automated exposure control, adjustment of the mA and/or kV according to patient size and/or use of iterative reconstruction technique. COMPARISON:  February 05, 2009 FINDINGS: Brain: Proportional prominence of the ventricles and sulci, consistent with diffuse cerebral parenchymal volume loss, which has significantly progressed in the interim. The ventricles otherwise maintained midline position without midline shift. 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 No evidence of acute territorial infarction, extra-axial fluid collection, hemorrhage, or mass lesion. The basilar cisterns are patent without downward herniation. The cerebellar hemispheres and vermis are well formed without mass lesion. 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 No cerebellar tonsillar ectopia greater than 5 mm. Vascular: No hyperdense vessel. Skull: Normal. Negative for fracture or focal lesion. Sinuses/Orbits: The paranasal sinuses and mastoids are clear. The globes appear intact. No retrobulbar hematoma. Other: None. IMPRESSION: 1. No acute intracranial abnormality, specifically, no acute hemorrhage, territorial infarction, or intracranial mass. 2. Significantly progressed cerebral volume loss with sequelae of chronic ischemic microvascular disease. Electronically Signed   By: Rance Burrows M.D.   On: 07/15/2023 17:43   CT ABDOMEN PELVIS W CONTRAST Result Date: 07/15/2023 CLINICAL DATA:  Right lower quadrant abdominal pain EXAM: CT ABDOMEN AND  PELVIS WITH CONTRAST TECHNIQUE: Multidetector CT imaging of the abdomen and pelvis was performed using the standard protocol following bolus administration of intravenous contrast. RADIATION DOSE REDUCTION: This exam was performed according to the departmental dose-optimization program which includes automated exposure control, adjustment of the mA and/or kV according to patient size and/or use of iterative reconstruction technique. CONTRAST:  75mL OMNIPAQUE  IOHEXOL  300 MG/ML  SOLN COMPARISON:  January 22, 2023 FINDINGS: Lower chest: No acute abnormality. Hepatobiliary: Heterogeneous nodular appearing liver could correlate with hepatocellular disease such as cirrhosis without parenchymal lesions or biliary dilatation. Multiple gallstones without gallbladder inflammatory changes. Small amount of fluid surrounding the liver correlates with minimal ascites. Portal vein is patent. No obvious varices. Pancreas: Unremarkable. No pancreatic ductal dilatation or surrounding inflammatory changes. Spleen: Normal in size without focal abnormality. Adrenals/Urinary Tract: Right kidney is normal. Adrenal glands are normal. Left kidney demonstrates a large calcification in the left renal pelvis measuring 1.4 x 1.4 cm without hydronephrosis consistent with left nephrolithiasis. And loosely formed staghorn calculus. Bladder unremarkable. Prostate gland within normal limits. Stomach/Bowel: Mucosal thickening of the cecum and ascending colon with subtle stranding of the mesenteric fat in the right lower quadrant with normal-appearing cecal appendix could correlate with colitis. Mild diffuse diverticulosis without diverticulitis Vascular/Lymphatic: Aortic atherosclerosis. No enlarged abdominal or pelvic lymph nodes. Reproductive: Prostate is unremarkable. Other: No abdominal wall hernia or abnormality. No abdominopelvic ascites. Musculoskeletal: No acute or significant osseous findings. IMPRESSION: *Mucosal thickening  of the cecum  and ascending colon with subtle stranding of the mesenteric fat in the right lower quadrant with normal-appearing cecal appendix could correlate with colitis. *Heterogeneous nodular appearing liver could correlate with hepatocellular disease such as cirrhosis without parenchymal lesions or biliary dilatation. *Multiple gallstones without gallbladder inflammatory changes. *Small amount of fluid surrounding the liver correlates with minimal ascites. *Left nephrolithiasis without hydronephrosis. *Aortic atherosclerosis. Electronically Signed   By: Fredrich Jefferson M.D.   On: 07/15/2023 17:29   DG Chest 2 View Result Date: 07/15/2023 CLINICAL DATA:  May 6 2 in 25 EXAM: CHEST - 2 VIEW COMPARISON:  None Available. FINDINGS: The heart size and mediastinal contours are within normal limits. Both lungs are clear. The visualized skeletal structures are unremarkable. No change in the nodular density of the left upper lobe compared with prior examination earlier today. Correlation with CT chest recommended. IMPRESSION: *No acute cardiopulmonary process. *No change in the nodular density of the left upper lobe compared with prior examination earlier today. Correlation with CT chest recommended. Electronically Signed   By: Fredrich Jefferson M.D.   On: 07/15/2023 17:25       Assessment / Plan:    #35 73 year old male with history of EtOH abuse, and EtOH induced cirrhosis presenting to the ER with complaints of weight loss, abdominal discomfort intermittent nausea and vomiting  CT of the abdomen pelvis yesterday showed mucosal thickening in the cecum and ascending colon with subtle mesenteric stranding and a nodular liver consistent with cirrhosis, gallstones without cholecystitis.  Sed rate elevated at 50, Patient is anemic-hemoglobin 7.7 today macrocytic-B12 and folate within normal limits, likely secondary to chronic EtOH use.  Patient does have lower abdominal discomfort on exam.  It is unclear if the abnormalities on  CT scan are responsible for patient's current symptoms, nevertheless he will require colonoscopy to further assess.  Also he has not had screening EGD in setting of EtOH induced cirrhosis and that can be done at the same setting.  #2 acute kidney injury-probably on chronic kidney disease #3 hypoalbuminemia #4.  Malnutrition #5.  History of atrial fibrillation not on anticoagulation #6.  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  History of hypertension but blood pressures have been on the lower side here  Plan; liquid diet today, n.p.o. after midnight Prep this evening and patient has been scheduled for colonoscopy and EGD with Dr. Karene Oto for tomorrow 07/18/2023.  Both procedures have been discussed in detail with the patient including indications risks and benefits and he is agreeable to proceed. Further recommendations pending findings at EGD and colonoscopy.    Principal Problem:   Colitis Active Problems:   AKI (acute kidney injury) (HCC)   FTT (failure to thrive) in adult   Hematochezia   Abnormal finding on GI tract imaging   Generalized abdominal pain     LOS: 0 days   Amy Esterwood PA-C 07/17/2023, 12:43 PM

## 2023-07-17 NOTE — Plan of Care (Signed)

## 2023-07-17 NOTE — Progress Notes (Signed)
 PT Cancellation Note  Patient Details Name: Marvin Ortega MRN: [id number] DOB: 1950/03/11   Cancelled Treatment:    Reason Eval/Treat Not Completed: Patient at procedure or test/unavailable  Off unit earlier when I stopped by for PT follow-up. Back to unit but about to receive blood transfusion. Will check back again this afternoon as schedule permits.  Jory Ng, PT, DPT [facility] Physical Therapist Office: [phone] Website: [email or web address]   Alinda Irani 07/17/2023, 11:55 AM

## 2023-07-17 NOTE — Progress Notes (Signed)
   07/17/23 1155  TOC Brief Assessment  Insurance and Status Reviewed ([employer] HMO)  Patient has primary care physician Yes (Entzminger, Iantha Mainland, MD)  Home environment has been reviewed From home alone  Prior level of function: independent   Uses no DME  Prior/Current Home Services No current home services (Has had [facility] in 2020)  Social Drivers of Health Review SDOH reviewed no interventions necessary  Readmission risk has been reviewed Yes (17%)  Transition of care needs no transition of care needs at this time   No TOC needs at this time. Please place [facility] consult if needs are identified

## 2023-07-17 NOTE — Progress Notes (Addendum)
 Patient ID: Marvin Ortega, male   DOB: 09-Sep-1950, 73 y.o.   MRN: [id number]     Attending physician's note   I have taken a history, reviewed the chart, and examined the patient. I performed a substantive portion of this encounter, including complete performance of at least one of the key components, in conjunction with the APP. I agree with the APP's note, impression, and recommendations with my edits.   Tentative plan for EGD/colonoscopy tomorrow provided he can tolerate bowel preparation.  BP stable this morning to start slow prep this afternoon. - CLD today - Prep this afternoon/evening and overnight - N.p.o. except medications and bowel preparation starting at midnight - Plan for EGD/colonoscopy with MAC tomorrow  Harry Lindau, DO, FACG [phone] office          Progress Note   Subjective   Day # 2 CC; weight loss, failure to thrive, abnormal CT with right lower quadrant/ascending colon inflammatory process, cirrhosis  Patient has no specific complaints today,  Renal ultrasound-minimal perihepatic and pelvic floor ascites nodular hepatic contour consistent with cirrhosis no acute abnormality of the kidneys 12 mm nonobstructing left renal stone  Labs today-WBC 6.0/hemoglobin 7.6/hematocrit 22.5/MCV 124/platelets 138 Potassium 4.3/BUN 22/creatinine 1.5 albumin 1.8 LFTs normal AFP pending CRP 0.6 BNP 91.5 B12 3026/folate within normal limits,'s ferritin 168     Objective   Vital signs in last 24 hours: Temp:  [97.7 F (36.5 C)-98.4 F (36.9 C)] 98.4 F (36.9 C) (05/09 1238) Pulse Rate:  [81-100] 85 (05/09 1238) Resp:  [10-19] 18 (05/09 1238) BP: (86-117)/(60-91) 89/60 (05/09 1238) SpO2:  [90 %-100 %] 100 % (05/09 0800) Last BM Date : 07/16/23 General:    Early African-American male in NAD Heart:  Regular rate and rhythm; no murmurs Lungs: Respirations even and unlabored, lungs CTA bilaterally Abdomen:  Soft, some tenderness in the lower abdomen,  right lower quadrant no guarding or rebound no palpable mass or hepatosplenomegaly. Extremities:  Without edema. Neurologic:  Alert and oriented,  grossly normal neurologically. Psych:  Cooperative. Normal mood and affect.  Intake/Output from previous day: 05/08 0701 - 05/09 0700 In: 1208.1 [P.O.:480; I.V.:626.8; IV Piggyback:101.3] Out: 350 [Urine:350] Intake/Output this shift: No intake/output data recorded.  Lab Results: Recent Labs    07/15/23 1613 07/16/23 0920 07/17/23 0621  WBC 6.5 5.5 6.0  HGB 8.0* 7.7* 7.6*  HCT 24.0* 23.5* 22.5*  PLT 152 153 138*   BMET Recent Labs    07/14/23 1317 07/15/23 1613 07/16/23 0920 07/17/23 0621  NA 132* 134*  --  136  K 5.4* 5.2*  --  4.3  CL 109 107  --  112*  CO2 15* 14*  --  17*  GLUCOSE 68* 73  --  71  BUN 33* 34*  --  22  CREATININE 2.12* 1.94* 1.60* 1.50*  CALCIUM  8.7* 8.9  --  8.3*   LFT Recent Labs    07/17/23 0621  PROT 5.5*  ALBUMIN 1.8*  AST 23  ALT 10  ALKPHOS 42  BILITOT 0.9   PT/INR Recent Labs    07/15/23 1613  LABPROT 16.7*  INR 1.3*    Studies/Results: US  RENAL Result Date: 07/17/2023 CLINICAL DATA:  Acute kidney injury EXAM: RENAL / URINARY TRACT ULTRASOUND COMPLETE COMPARISON:  CT abdomen pelvis 07/15/2023 FINDINGS: Right Kidney: Renal measurements: 9.4 x 5.2 x 4.5 cm = volume: 112 mL. Echogenicity within normal limits. No mass or hydronephrosis visualized. Left Kidney: Renal measurements: 10.0 x 4.2 x 4.5 cm =  volume: 96 mL. Echogenicity within normal limits. No mass or hydronephrosis visualized. 12 mm calculus seen in the lower pole. Bladder: Appears normal for degree of bladder distention. Other: Minimal perihepatic and pelvic floor ascites is present. Nodular patent contours consistent with cirrhosis. IMPRESSION: 1. No acute abnormality of the kidneys. 2. 12 mm nonobstructing left renal calculus. Electronically Signed   By: Elester Grim M.D.   On: 07/17/2023 11:37   CT Head Wo Contrast Result  Date: 07/15/2023 CLINICAL DATA:  Head trauma, altered mental status (Ped 0-17y) had a fall, hx of etoh use, fatigue, EXAM: CT HEAD WITHOUT CONTRAST TECHNIQUE: Contiguous axial images were obtained from the base of the skull through the vertex without intravenous contrast. RADIATION DOSE REDUCTION: This exam was performed according to the departmental dose-optimization program which includes automated exposure control, adjustment of the mA and/or kV according to patient size and/or use of iterative reconstruction technique. COMPARISON:  February 05, 2009 FINDINGS: Brain: Proportional prominence of the ventricles and sulci, consistent with diffuse cerebral parenchymal volume loss, which has significantly progressed in the interim. The ventricles otherwise maintained midline position without midline shift. 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 No evidence of acute territorial infarction, extra-axial fluid collection, hemorrhage, or mass lesion. The basilar cisterns are patent without downward herniation. The cerebellar hemispheres and vermis are well formed without mass lesion. 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 No cerebellar tonsillar ectopia greater than 5 mm. Vascular: No hyperdense vessel. Skull: Normal. Negative for fracture or focal lesion. Sinuses/Orbits: The paranasal sinuses and mastoids are clear. The globes appear intact. No retrobulbar hematoma. Other: None. IMPRESSION: 1. No acute intracranial abnormality, specifically, no acute hemorrhage, territorial infarction, or intracranial mass. 2. Significantly progressed cerebral volume loss with sequelae of chronic ischemic microvascular disease. Electronically Signed   By: Rance Burrows M.D.   On: 07/15/2023 17:43   CT ABDOMEN PELVIS W CONTRAST Result Date: 07/15/2023 CLINICAL DATA:  Right lower quadrant abdominal pain EXAM: CT ABDOMEN AND  PELVIS WITH CONTRAST TECHNIQUE: Multidetector CT imaging of the abdomen and pelvis was performed using the standard protocol following bolus administration of intravenous contrast. RADIATION DOSE REDUCTION: This exam was performed according to the departmental dose-optimization program which includes automated exposure control, adjustment of the mA and/or kV according to patient size and/or use of iterative reconstruction technique. CONTRAST:  75mL OMNIPAQUE  IOHEXOL  300 MG/ML  SOLN COMPARISON:  January 22, 2023 FINDINGS: Lower chest: No acute abnormality. Hepatobiliary: Heterogeneous nodular appearing liver could correlate with hepatocellular disease such as cirrhosis without parenchymal lesions or biliary dilatation. Multiple gallstones without gallbladder inflammatory changes. Small amount of fluid surrounding the liver correlates with minimal ascites. Portal vein is patent. No obvious varices. Pancreas: Unremarkable. No pancreatic ductal dilatation or surrounding inflammatory changes. Spleen: Normal in size without focal abnormality. Adrenals/Urinary Tract: Right kidney is normal. Adrenal glands are normal. Left kidney demonstrates a large calcification in the left renal pelvis measuring 1.4 x 1.4 cm without hydronephrosis consistent with left nephrolithiasis. And loosely formed staghorn calculus. Bladder unremarkable. Prostate gland within normal limits. Stomach/Bowel: Mucosal thickening of the cecum and ascending colon with subtle stranding of the mesenteric fat in the right lower quadrant with normal-appearing cecal appendix could correlate with colitis. Mild diffuse diverticulosis without diverticulitis Vascular/Lymphatic: Aortic atherosclerosis. No enlarged abdominal or pelvic lymph nodes. Reproductive: Prostate is unremarkable. Other: No abdominal wall hernia or abnormality. No abdominopelvic ascites. Musculoskeletal: No acute or significant osseous findings. IMPRESSION: *Mucosal thickening  of the cecum  and ascending colon with subtle stranding of the mesenteric fat in the right lower quadrant with normal-appearing cecal appendix could correlate with colitis. *Heterogeneous nodular appearing liver could correlate with hepatocellular disease such as cirrhosis without parenchymal lesions or biliary dilatation. *Multiple gallstones without gallbladder inflammatory changes. *Small amount of fluid surrounding the liver correlates with minimal ascites. *Left nephrolithiasis without hydronephrosis. *Aortic atherosclerosis. Electronically Signed   By: Fredrich Jefferson M.D.   On: 07/15/2023 17:29   DG Chest 2 View Result Date: 07/15/2023 CLINICAL DATA:  May 6 2 in 25 EXAM: CHEST - 2 VIEW COMPARISON:  None Available. FINDINGS: The heart size and mediastinal contours are within normal limits. Both lungs are clear. The visualized skeletal structures are unremarkable. No change in the nodular density of the left upper lobe compared with prior examination earlier today. Correlation with CT chest recommended. IMPRESSION: *No acute cardiopulmonary process. *No change in the nodular density of the left upper lobe compared with prior examination earlier today. Correlation with CT chest recommended. Electronically Signed   By: Fredrich Jefferson M.D.   On: 07/15/2023 17:25       Assessment / Plan:    #35 73 year old male with history of EtOH abuse, and EtOH induced cirrhosis presenting to the ER with complaints of weight loss, abdominal discomfort intermittent nausea and vomiting  CT of the abdomen pelvis yesterday showed mucosal thickening in the cecum and ascending colon with subtle mesenteric stranding and a nodular liver consistent with cirrhosis, gallstones without cholecystitis.  Sed rate elevated at 50, Patient is anemic-hemoglobin 7.7 today macrocytic-B12 and folate within normal limits, likely secondary to chronic EtOH use.  Patient does have lower abdominal discomfort on exam.  It is unclear if the abnormalities on  CT scan are responsible for patient's current symptoms, nevertheless he will require colonoscopy to further assess.  Also he has not had screening EGD in setting of EtOH induced cirrhosis and that can be done at the same setting.  #2 acute kidney injury-probably on chronic kidney disease #3 hypoalbuminemia #4.  Malnutrition #5.  History of atrial fibrillation not on anticoagulation #6.  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  History of hypertension but blood pressures have been on the lower side here  Plan; liquid diet today, n.p.o. after midnight Prep this evening and patient has been scheduled for colonoscopy and EGD with Dr. Karene Oto for tomorrow 07/18/2023.  Both procedures have been discussed in detail with the patient including indications risks and benefits and he is agreeable to proceed. Further recommendations pending findings at EGD and colonoscopy.    Principal Problem:   Colitis Active Problems:   AKI (acute kidney injury) (HCC)   FTT (failure to thrive) in adult   Hematochezia   Abnormal finding on GI tract imaging   Generalized abdominal pain     LOS: 0 days   Amy Esterwood PA-C 07/17/2023, 12:43 PM

## 2023-07-17 NOTE — Anesthesia Preprocedure Evaluation (Signed)
 Anesthesia Evaluation  Patient identified by MRN, date of birth, ID band Patient awake    Reviewed: Allergy & Precautions, NPO status , Patient's Chart, lab work & pertinent test results, reviewed documented beta blocker date and time   History of Anesthesia Complications Negative for: history of anesthetic complications  Airway Mallampati: I  TM Distance: >3 FB Neck ROM: Full    Dental  (+) Poor Dentition, Missing, Loose, Chipped, Dental Advisory Given   Pulmonary COPD,  COPD inhaler   breath sounds clear to auscultation       Cardiovascular hypertension, Pt. on medications and Pt. on home beta blockers (-) angina + dysrhythmias Atrial Fibrillation  Rhythm:Regular Rate:Normal  '21 ECHO: EF 60 to 65%.  1. The LV has normal function, no LVH, no regional wall motion abnormalities.  2. Global right ventricle has normal systolic function.The right  ventricular size is normal. No increase in right ventricular wall  thickness.  3. Left atrial size was mildly dilated.  4. Right atrial size was mildly dilated.  5. The mitral valve is normal in structure. Trivial MR. No evidence of mitral stenosis.  6. The tricuspid valve is normal in structure.  7. The tricuspid valve is normal in structure. Trivial TR.  8. The aortic valve is normal in structure. Aortic valve regurgitation is  not visualized. No evidence of aortic valve sclerosis or stenosis.     Neuro/Psych negative neurological ROS     GI/Hepatic ,GERD  Medicated and Controlled,,(+) Cirrhosis     substance abuse  alcohol useEmesis subsided  Lower GI bleed   Endo/Other  negative endocrine ROS    Renal/GU Renal InsufficiencyRenal disease     Musculoskeletal   Abdominal   Peds  Hematology  (+) Blood dyscrasia (Hb 7.6, plt 138k), anemia   Anesthesia Other Findings   Reproductive/Obstetrics                             Anesthesia  Physical Anesthesia Plan  ASA: 3  Anesthesia Plan: MAC   Post-op Pain Management: Minimal or no pain anticipated   Induction:   PONV Risk Score and Plan: 1  Airway Management Planned: Natural Airway and Simple Face Mask  Additional Equipment: None  Intra-op Plan:   Post-operative Plan:   Informed Consent: I have reviewed the patients History and Physical, chart, labs and discussed the procedure including the risks, benefits and alternatives for the proposed anesthesia with the patient or authorized representative who has indicated his/her understanding and acceptance.     Dental advisory given  Plan Discussed with: CRNA and Surgeon  Anesthesia Plan Comments:         Anesthesia Quick Evaluation

## 2023-07-18 ENCOUNTER — Encounter (HOSPITAL_COMMUNITY): Payer: Self-pay

## 2023-07-18 ENCOUNTER — Encounter (HOSPITAL_COMMUNITY)
Admission: EM | Disposition: A | Discharge status: Home / Self Care | Payer: Self-pay | Source: Home / Self Care | Attending: Internal Medicine

## 2023-07-18 ENCOUNTER — Inpatient Hospital Stay (HOSPITAL_COMMUNITY): Payer: Self-pay | Admitting: Anesthesiology

## 2023-07-18 DIAGNOSIS — I10 Essential (primary) hypertension: Secondary | ICD-10-CM

## 2023-07-18 DIAGNOSIS — K641 Second degree hemorrhoids: Secondary | ICD-10-CM | POA: Diagnosis not present

## 2023-07-18 DIAGNOSIS — K529 Noninfective gastroenteritis and colitis, unspecified: Secondary | ICD-10-CM | POA: Diagnosis not present

## 2023-07-18 DIAGNOSIS — K5730 Diverticulosis of large intestine without perforation or abscess without bleeding: Secondary | ICD-10-CM | POA: Diagnosis not present

## 2023-07-18 DIAGNOSIS — K2900 Acute gastritis without bleeding: Secondary | ICD-10-CM | POA: Diagnosis not present

## 2023-07-18 DIAGNOSIS — K449 Diaphragmatic hernia without obstruction or gangrene: Secondary | ICD-10-CM | POA: Diagnosis not present

## 2023-07-18 DIAGNOSIS — K269 Duodenal ulcer, unspecified as acute or chronic, without hemorrhage or perforation: Secondary | ICD-10-CM | POA: Diagnosis not present

## 2023-07-18 DIAGNOSIS — E43 Unspecified severe protein-calorie malnutrition: Secondary | ICD-10-CM | POA: Insufficient documentation

## 2023-07-18 DIAGNOSIS — R1030 Lower abdominal pain, unspecified: Secondary | ICD-10-CM | POA: Diagnosis not present

## 2023-07-18 DIAGNOSIS — R112 Nausea with vomiting, unspecified: Secondary | ICD-10-CM

## 2023-07-18 DIAGNOSIS — K921 Melena: Secondary | ICD-10-CM | POA: Diagnosis not present

## 2023-07-18 DIAGNOSIS — K2970 Gastritis, unspecified, without bleeding: Secondary | ICD-10-CM

## 2023-07-18 HISTORY — PX: ESOPHAGOGASTRODUODENOSCOPY: SHX5428

## 2023-07-18 HISTORY — PX: COLONOSCOPY: SHX5424

## 2023-07-18 LAB — C-REACTIVE PROTEIN: CRP: 0.7 mg/dL (ref <1.0)

## 2023-07-18 LAB — COMPREHENSIVE METABOLIC PANEL WITH GFR
ALT: 9 U/L (ref 0–44)
AST: 25 U/L (ref 15–41)
Albumin: 1.8 g/dL — ABNORMAL LOW (ref 3.5–5.0)
Alkaline Phosphatase: 41 U/L (ref 38–126)
Anion gap: 8 (ref 5–15)
BUN: 20 mg/dL (ref 8–23)
CO2: 15 mmol/L — ABNORMAL LOW (ref 22–32)
Calcium: 8.3 mg/dL — ABNORMAL LOW (ref 8.9–10.3)
Chloride: 115 mmol/L — ABNORMAL HIGH (ref 98–111)
Creatinine, Ser: 1.38 mg/dL — ABNORMAL HIGH (ref 0.61–1.24)
GFR, Estimated: 54 mL/min — ABNORMAL LOW (ref >60)
Glucose, Bld: 87 mg/dL (ref 70–99)
Potassium: 4.1 mmol/L (ref 3.5–5.1)
Sodium: 138 mmol/L (ref 135–145)
Total Bilirubin: 1.4 mg/dL — ABNORMAL HIGH (ref 0.0–1.2)
Total Protein: 5.7 g/dL — ABNORMAL LOW (ref 6.5–8.1)

## 2023-07-18 LAB — CBC WITH DIFFERENTIAL/PLATELET
Abs Immature Granulocytes: 0 10*3/uL (ref 0.00–0.07)
Basophils Absolute: 0 10*3/uL (ref 0.0–0.1)
Basophils Relative: 0 %
Eosinophils Absolute: 0.1 10*3/uL (ref 0.0–0.5)
Eosinophils Relative: 1 %
HCT: 28.5 % — ABNORMAL LOW (ref 39.0–52.0)
Hemoglobin: 9.7 g/dL — ABNORMAL LOW (ref 13.0–17.0)
Lymphocytes Relative: 4 %
Lymphs Abs: 0.3 10*3/uL — ABNORMAL LOW (ref 0.7–4.0)
MCH: 37.5 pg — ABNORMAL HIGH (ref 26.0–34.0)
MCHC: 34 g/dL (ref 30.0–36.0)
MCV: 110 fL — ABNORMAL HIGH (ref 80.0–100.0)
Monocytes Absolute: 0.1 10*3/uL (ref 0.1–1.0)
Monocytes Relative: 2 %
Neutro Abs: 5.9 10*3/uL (ref 1.7–7.7)
Neutrophils Relative %: 93 %
Platelets: 139 10*3/uL — ABNORMAL LOW (ref 150–400)
RBC: 2.59 MIL/uL — ABNORMAL LOW (ref 4.22–5.81)
WBC: 6.3 10*3/uL (ref 4.0–10.5)
nRBC: 0 % (ref 0.0–0.2)
nRBC: 0 /100{WBCs}

## 2023-07-18 LAB — PROCALCITONIN: Procalcitonin: 0.27 ng/mL

## 2023-07-18 LAB — BRAIN NATRIURETIC PEPTIDE: B Natriuretic Peptide: 145 pg/mL — ABNORMAL HIGH (ref 0.0–100.0)

## 2023-07-18 LAB — PHOSPHORUS: Phosphorus: 2.9 mg/dL (ref 2.5–4.6)

## 2023-07-18 LAB — MAGNESIUM: Magnesium: 2.3 mg/dL (ref 1.7–2.4)

## 2023-07-18 LAB — AFP TUMOR MARKER: AFP, Serum, Tumor Marker: 5 ng/mL (ref 0.0–8.4)

## 2023-07-18 SURGERY — COLONOSCOPY
Anesthesia: Monitor Anesthesia Care

## 2023-07-18 MED ORDER — SODIUM CHLORIDE 0.9 % IV SOLN
INTRAVENOUS | Status: AC | PRN
  Administered 2023-07-18: 1000 mL via INTRAVENOUS

## 2023-07-18 MED ORDER — PROPOFOL 500 MG/50ML IV EMUL
INTRAVENOUS | Status: DC | PRN
  Administered 2023-07-18: 125 ug/kg/min via INTRAVENOUS

## 2023-07-18 MED ORDER — LACTATED RINGERS IV BOLUS
1000.0000 mL | Freq: Once | INTRAVENOUS | Status: AC
  Administered 2023-07-18: 1000 mL via INTRAVENOUS

## 2023-07-18 MED ORDER — LACTATED RINGERS IV SOLN: INTRAVENOUS | Status: AC

## 2023-07-18 MED ORDER — SUCRALFATE 1 GM/10ML PO SUSP
1.0000 g | Freq: Three times a day (TID) | ORAL | Status: DC
  Administered 2023-07-18 – 2023-07-23 (×19): 1 g via ORAL
  Filled 2023-07-18 (×19): qty 10

## 2023-07-18 MED ORDER — PHENYLEPHRINE 80 MCG/ML (10ML) SYRINGE FOR IV PUSH (FOR BLOOD PRESSURE SUPPORT)
PREFILLED_SYRINGE | INTRAVENOUS | Status: DC | PRN
  Administered 2023-07-18 (×2): 80 ug via INTRAVENOUS

## 2023-07-18 MED ORDER — PANTOPRAZOLE SODIUM 40 MG PO TBEC
40.0000 mg | DELAYED_RELEASE_TABLET | Freq: Two times a day (BID) | ORAL | Status: DC
  Administered 2023-07-18 – 2023-07-23 (×10): 40 mg via ORAL
  Filled 2023-07-18 (×10): qty 1

## 2023-07-18 MED ORDER — PROPOFOL 10 MG/ML IV BOLUS
INTRAVENOUS | Status: DC | PRN
  Administered 2023-07-18 (×2): 20 mg via INTRAVENOUS

## 2023-07-18 MED ORDER — LIDOCAINE 2% (20 MG/ML) 5 ML SYRINGE
INTRAMUSCULAR | Status: DC | PRN
  Administered 2023-07-18: 50 mg via INTRAVENOUS

## 2023-07-18 NOTE — Op Note (Signed)
 [facility] Patient Name: Abdel Decaprio Procedure Date : 07/18/2023 MRN: [id number] Attending MD: Harry Lindau , MD, [id number] Date of Birth: [birth year] CSN: [id number] Age: 73 Admit Type: Inpatient Procedure:                Upper GI endoscopy Indications:              Generalized abdominal pain, Cirrhosis rule out                            esophageal varices, Nausea with vomiting, Weight                            loss Providers:                Harry Lindau, MD, Bradley Caffey, Rinda Cheers,                            Technician Referring MD:              Medicines:                Monitored Anesthesia Care Complications:            No immediate complications. Estimated Blood Loss:     Estimated blood loss was minimal. Procedure:                Pre-Anesthesia Assessment:                           - Prior to the procedure, a History and Physical                            was performed, and patient medications and                            allergies were reviewed. The patient's tolerance of                            previous anesthesia was also reviewed. The risks                            and benefits of the procedure and the sedation                            options and risks were discussed with the patient.                            All questions were answered, and informed consent                            was obtained. Prior Anticoagulants: The patient has                            taken no anticoagulant or antiplatelet agents. ASA                            Grade Assessment: III -  A patient with severe                            systemic disease. After reviewing the risks and                            benefits, the patient was deemed in satisfactory                            condition to undergo the procedure.                           After obtaining informed consent, the endoscope was                            passed under direct vision.  Throughout the                            procedure, the patient's blood pressure, pulse, and                            oxygen saturations were monitored continuously. The                            [id number]-[id number] ([id number]) Olympus endoscope was introduced                            through the mouth, and advanced to the third part                            of duodenum. The upper GI endoscopy was                            accomplished without difficulty. The patient                            tolerated the procedure well. Scope In: Scope Out: Findings:      A 5 cm hiatal hernia was present.      The Z-line was regular and was found 30 cm from the incisors. The       esophagus was otherwise normal appearing. No esophageal varices noted on       this study.      Diffuse mild inflammation characterized by congestion (edema) was found       in the gastric body, at the incisura and in the gastric antrum. Biopsies       were taken with a cold forceps for histology and Helicobacter pylori       testing. Estimated blood loss was minimal.      Two non-bleeding cratered duodenal ulcers with no stigmata of bleeding       were found in the duodenal bulb. The largest lesion was 6 mm in largest       dimension. Biopsies were taken with a cold forceps for histology.       Estimated blood loss was minimal.      The second portion of the duodenum and third  portion of the duodenum       were normal. Impression:               - 5 cm hiatal hernia.                           - Z-line regular, 30 cm from the incisors.                           - Gastritis. Biopsied.                           - Non-bleeding duodenal ulcers with no stigmata of                            bleeding. Biopsied.                           - Normal second portion of the duodenum and third                            portion of the duodenum. Recommendation:           - Return patient to hospital ward for ongoing care.                            - Advance diet as tolerated.                           - Use Protonix  (pantoprazole ) 40 mg PO BID for 8                            weeks, then reduce to 40 mg daily..                           - Use sucralfate suspension 1 gram PO BID for 2                            weeks.                           - Start folic acid .                           - Await pathology results.                           - Perform a colonoscopy today. Additional                            recommendations for ongoing inpatient management                            pending colonoscopy findings. Procedure Code(s):        --- Professional ---  [address], Esophagogastroduodenoscopy, flexible,                            transoral; with biopsy, single or multiple Diagnosis Code(s):        --- Professional ---                           K44.9, Diaphragmatic hernia without obstruction or                            gangrene                           K29.70, Gastritis, unspecified, without bleeding                           K26.9, Duodenal ulcer, unspecified as acute or                            chronic, without hemorrhage or perforation                           R10.84, Generalized abdominal pain                           K74.60, Unspecified cirrhosis of liver                           R11.2, Nausea with vomiting, unspecified                           R63.4, Abnormal weight loss CPT copyright 2022 American Medical Association. All rights reserved. The codes documented in this report are preliminary and upon coder review may  be revised to meet current compliance requirements. Harry Lindau, MD 07/18/2023 9:57:19 AM Number of Addenda: 0

## 2023-07-18 NOTE — Interval H&P Note (Signed)
 History and Physical Interval Note:  07/18/2023 9:01 AM  Marvin Ortega  has presented today for surgery, with the diagnosis of Cirrhosis, weight loss, lower abdominal pain, abnormal CT.  The various methods of treatment have been discussed with the patient and family. After consideration of risks, benefits and other options for treatment, the patient has consented to  Procedure(s): COLONOSCOPY (N/A) EGD (ESOPHAGOGASTRODUODENOSCOPY) (N/A) as a surgical intervention.  The patient's history has been reviewed, patient examined, no change in status, stable for surgery.  I have reviewed the patient's chart and labs.  Questions were answered to the patient's satisfaction.     Laquetta Plank Dencil Cayson

## 2023-07-18 NOTE — Transfer of Care (Signed)
 Immediate Anesthesia Transfer of Care Note  Patient: Marvin Ortega  Procedure(s) Performed: COLONOSCOPY EGD (ESOPHAGOGASTRODUODENOSCOPY)  Patient Location: PACU  Anesthesia Type:MAC  Level of Consciousness: drowsy and patient cooperative  Airway & Oxygen Therapy: Patient Spontanous Breathing and Patient connected to face mask oxygen  Post-op Assessment: Report given to RN, Post -op Vital signs reviewed and stable, and Patient moving all extremities  Post vital signs: Reviewed and stable  Last Vitals:  Vitals Value Taken Time  BP 95/74 07/18/23 0954  Temp    Pulse 80 07/18/23 0956  Resp 17 07/18/23 0956  SpO2 93 % 07/18/23 0956  Vitals shown include unfiled device data.  Last Pain:  Vitals:   07/18/23 0954  TempSrc:   PainSc: Asleep         Complications: No notable events documented.

## 2023-07-18 NOTE — Plan of Care (Signed)
   Problem: Clinical Measurements: Goal: Diagnostic test results will improve Outcome: Progressing Goal: Respiratory complications will improve Outcome: Progressing   Problem: Safety: Goal: Ability to remain free from injury will improve Outcome: Progressing

## 2023-07-18 NOTE — Plan of Care (Signed)

## 2023-07-18 NOTE — Progress Notes (Signed)
 PROGRESS NOTE                                                                                                                                                                                                             Patient Demographics:    Marvin Ortega, is a 73 y.o. male, DOB - 07-Oct-1950, UJW:[id number]  Outpatient Primary MD for the patient is Entzminger, Iantha Mainland, MD    LOS - 1  Admit date - 07/15/2023    Chief Complaint  Patient presents with   Fatigue       Brief Narrative (HPI from H&P)    73 y.o. male with medical history significant of  alcoholic liver disease, anemia, paroxysmal A-fib not on anticoagulation due to presumed alcohol use, BPH, COPD, GERD, history of GI bleed, hypertension, prediabetes presented to the ED with complaints of generalized weakness/fatigue and poor p.o. intake iso thickened cecum c/f infection vs malignancy on imaging.    Subjective:   Patient in bed, appears comfortable, denies any headache, no fever, no chest pain or pressure, no shortness of breath , no abdominal pain. No new focal weakness.    Assessment  & Plan :    Pt with fatigue and poor PO intake, as well as low albumin, appears frail and cachectic, so has significant anemia, imaging suggestive of thickening in the cecum colitis versus malignancy. Presentation suspicious for severe protein calorie malnutrition, underwent EGD and colonoscopy on 07/18/2023, EGD showing 2 duodenal bulb ulcers but they were not bleeding, colonoscopy showed diverticulosis with internal hemorrhoids which were likely the source of bleeding, he is s/p 1 unit of packed RBC transfusion on 07/17/2023, question intermittent chronic occult blood loss.  Surprisingly his anemia panel appears stable for now.  Continue gentle IV fluids and midodrine for blood pressure support.  Encourage oral intake.   #Thickened cecum #Presumed colitis Please review above,  GI on board, placed on Rocephin  and Flagyl  in case this is colitis.   #AKI Cr 1.9 on admission. Baseline 1.0. Presumed pre-renal.  Transfused, hydrated, renal function improving renal ultrasound nonacute.  #Macrocytic anemia - Stable B12, folate and TSH.   Hypomagnesemia.  Replaced.  Severe PCM.  Protein supplement.  Dyslipidemia.  On statin.       Condition - Extremely Guarded  Family Communication  : Niece Gabriel John [phone]  over the  phone on 07/17/2023 at 9:19 AM.  Message left  Code Status :  Full  Consults  :  GI  PUD Prophylaxis : PPI   Procedures  :     EGD & colonoscopy on 07/18/2023.    EGD with 2 duodenal bulb ulcers, both clean-based and without high-grade stigmata of bleeding (biopsied), gastritis (biopsied), large 5 cm hiatal hernia, but no esophageal varices. Colonoscopy with diverticulosis and internal hemorrhoids (likely source of bleeding was hemorrhoidal) without any blood throughout the GI lumen. Normal terminal ileum   CT Head - Non acute  CT Abd Pelvis - Mucosal thickening of the cecum and ascending colon with subtle stranding of the mesenteric fat in the right lower quadrant with normal-appearing cecal appendix could correlate with colitis. *Heterogeneous nodular appearing liver could correlate with hepatocellular disease such as cirrhosis without parenchymal lesions or biliary dilatation. *Multiple gallstones without gallbladder inflammatory changes. *Small amount of fluid surrounding the liver correlates with minimal ascites. *Left nephrolithiasis without hydronephrosis. *Aortic atherosclerosis.      Disposition Plan  :    Status is: Observation   DVT Prophylaxis  :    SCDs  Lab Results  Component Value Date   PLT 139 (L) 07/18/2023    Diet :  Diet Order             Diet NPO time specified Except for: Sips with Meds  Diet effective midnight                    Inpatient Medications  Scheduled Meds:  [MAR Hold] (feeding  supplement) PROSource Plus  30 mL Oral BID AC & HS   [MAR Hold] atorvastatin   20 mg Oral Daily   [MAR Hold] cyanocobalamin   1,000 mcg Oral Daily   [MAR Hold] feeding supplement  1 Container Oral TID BM   [MAR Hold] finasteride   5 mg Oral q morning   [MAR Hold] metroNIDAZOLE   500 mg Oral Q12H   [MAR Hold] midodrine  10 mg Oral TID WC   [MAR Hold] multivitamin with minerals  1 tablet Oral Daily   [MAR Hold] pantoprazole   40 mg Oral Daily   [MAR Hold] thiamine   100 mg Per Tube Daily   Continuous Infusions:  [MAR Hold] cefTRIAXone  (ROCEPHIN )  IV 2 g (07/17/23 1139)   PRN Meds:.[MAR Hold] albuterol , [MAR Hold] ondansetron   Antibiotics  :    Anti-infectives (From admission, onward)    Start     Dose/Rate Route Frequency Ordered Stop   07/16/23 1800  [MAR Hold]  cefTRIAXone  (ROCEPHIN ) 2 g in sodium chloride  0.9 % 100 mL IVPB        (MAR Hold since Sat 07/18/2023 at 0853.Hold Reason: Transfer to a Procedural area)   2 g 200 mL/hr over 30 Minutes Intravenous Daily 07/16/23 0921     07/16/23 1000  [MAR Hold]  metroNIDAZOLE  (FLAGYL ) tablet 500 mg        (MAR Hold since Sat 07/18/2023 at 0853.Hold Reason: Transfer to a Procedural area)   500 mg Oral Every 12 hours 07/16/23 0921     07/15/23 2100  metroNIDAZOLE  (FLAGYL ) IVPB 500 mg        500 mg 100 mL/hr over 60 Minutes Intravenous  Once 07/15/23 2059 07/15/23 2231   07/15/23 1730  cefTRIAXone  (ROCEPHIN ) 2 g in sodium chloride  0.9 % 100 mL IVPB        2 g 200 mL/hr over 30 Minutes Intravenous  Once 07/15/23 1727 07/15/23 1843  Objective:   Vitals:   07/18/23 0800 07/18/23 0807 07/18/23 0857 07/18/23 0954  BP: (!) 104/58 106/75 102/71 95/74  Pulse: 68 77 76 82  Resp: 13 10 10 17   Temp:  97.9 F (36.6 C)    TempSrc:  Oral    SpO2: 100% 100% 100% 93%  Weight:      Height:        Wt Readings from Last 3 Encounters:  07/16/23 47 kg  07/14/23 48.2 kg  07/14/23 48.3 kg     Intake/Output Summary (Last 24 hours) at  07/18/2023 0959 Last data filed at 07/18/2023 0950 Gross per 24 hour  Intake 1836.86 ml  Output --  Net 1836.86 ml     Physical Exam  Frail cachectic elderly African-American male, awake Alert, No new F.N deficits, Normal affect [place].AT,PERRAL Supple Neck, No JVD,   Symmetrical Chest wall movement, Good air movement bilaterally, CTAB RRR,No Gallops,Rubs or new Murmurs,  +ve B.Sounds, Abd Soft, No tenderness,   No Cyanosis, Clubbing or edema      Data Review:    Recent Labs  Lab 07/14/23 1317 07/15/23 1613 07/16/23 0920 07/17/23 0621 07/18/23 0637  WBC 7.9 6.5 5.5 6.0 6.3  HGB 9.3* 8.0* 7.7* 7.6* 9.7*  HCT 28.6* 24.0* 23.5* 22.5* 28.5*  PLT 187 152 153 138* 139*  MCV 128.3* 125.7* 127.7* 124.3* 110.0*  MCH 41.7* 41.9* 41.8* 42.0* 37.5*  MCHC 32.5 33.3 32.8 33.8 34.0  RDW 13.2 13.2 13.3 13.4 Not Measured  LYMPHSABS  --  1.5  --   --  0.3*  MONOABS  --  0.7  --   --  0.1  EOSABS  --  0.0  --   --  0.1  BASOSABS  --  0.0  --   --  0.0    Recent Labs  Lab 07/14/23 1317 07/15/23 1613 07/16/23 0920 07/16/23 1519 07/17/23 0621 07/17/23 1016 07/18/23 0637  NA 132* 134*  --   --  136  --  138  K 5.4* 5.2*  --   --  4.3  --  4.1  CL 109 107  --   --  112*  --  115*  CO2 15* 14*  --   --  17*  --  15*  ANIONGAP 8 13  --   --  7  --  8  GLUCOSE 68* 73  --   --  71  --  87  BUN 33* 34*  --   --  22  --  20  CREATININE 2.12* 1.94* 1.60*  --  1.50*  --  1.38*  AST  --  26  --   --  23  --  25  ALT  --  9  --   --  10  --  9  ALKPHOS  --  68  --   --  42  --  41  BILITOT  --  0.6  --   --  0.9  --  1.4*  ALBUMIN  --  2.6*  --   --  1.8*  --  1.8*  CRP  --   --   --  0.8 0.6  --  0.7  PROCALCITON  --   --   --   --  0.34  --  0.27  LATICACIDVEN  --  1.7  --   --   --   --   --   INR  --  1.3*  --   --   --   --   --  TSH  --   --   --   --   --  2.521  --   AMMONIA  --  21  --   --   --   --   --   BNP  --   --   --   --  91.5  --  145.0*  MG  --   --   --   --   1.3*  --  2.3  PHOS  --   --   --   --   --   --  2.9  CALCIUM  8.7* 8.9  --   --  8.3*  --  8.3*      Recent Labs  Lab 07/14/23 1317 07/15/23 1613 07/16/23 1519 07/17/23 0621 07/17/23 1016 07/18/23 0637  CRP  --   --  0.8 0.6  --  0.7  PROCALCITON  --   --   --  0.34  --  0.27  LATICACIDVEN  --  1.7  --   --   --   --   INR  --  1.3*  --   --   --   --   TSH  --   --   --   --  2.521  --   AMMONIA  --  21  --   --   --   --   BNP  --   --   --  91.5  --  145.0*  MG  --   --   --  1.3*  --  2.3  CALCIUM  8.7* 8.9  --  8.3*  --  8.3*    --------------------------------------------------------------------------------------------------------------- Lab Results  Component Value Date   CHOL 129 08/17/2012   HDL 91 (A) 08/17/2012   LDLCALC 24 08/17/2012   TRIG 70 08/17/2012   CHOLHDL 1.9 07/20/2009    Lab Results  Component Value Date   HGBA1C 6.0 (H) 07/20/2021   Recent Labs    07/17/23 1016  TSH 2.521  FREET4 0.83   Recent Labs    07/16/23 1006 07/17/23 0621  VITAMINB12 3,394* 3,026*  FOLATE  --  6.6  FERRITIN  --  168  TIBC  --  175*  IRON  --  57  RETICCTPCT  --  2.6   ------------------------------------------------------------------------------------------------------------------ Cardiac Enzymes No results for input(s): "CKMB", "TROPONINI", "MYOGLOBIN" in the last 168 hours.  Invalid input(s): "CK"  Micro Results Recent Results (from the past 240 hours)  Blood culture (routine x 2)     Status: None (Preliminary result)   Collection Time: 07/15/23  4:05 PM   Specimen: BLOOD  Result Value Ref Range Status   Specimen Description   Final    BLOOD LEFT ANTECUBITAL Performed at [facility], [address] [place], [place] [address]    Special Requests   Final    Blood Culture adequate volume BOTTLES DRAWN AEROBIC AND ANAEROBIC Performed at [facility], [address] [place], [place] [address]    Culture    Final    NO GROWTH 3 DAYS Performed at [facility], [address] [place], [place] [address]    Report Status PENDING  Incomplete  Blood culture (routine x 2)     Status: None (Preliminary result)   Collection Time: 07/15/23  4:10 PM   Specimen: BLOOD  Result Value Ref Range Status   Specimen Description   Final    BLOOD RIGHT ANTECUBITAL Performed at [facility], [address] [place], [place] [address]    Special Requests   Final    Blood Culture adequate volume BOTTLES DRAWN AEROBIC AND ANAEROBIC Performed at [facility], [address] [place], [place] [address]    Culture   Final    NO GROWTH 3 DAYS Performed at [facility], [address] [place], [place] [address]    Report Status PENDING  Incomplete    Radiology Report US  RENAL Result Date: 07/17/2023 CLINICAL DATA:  Acute kidney injury EXAM: RENAL / URINARY TRACT ULTRASOUND COMPLETE COMPARISON:  CT abdomen pelvis 07/15/2023 FINDINGS: Right Kidney: Renal measurements: 9.4 x 5.2 x 4.5 cm = volume: 112 mL. Echogenicity within normal limits. No mass or hydronephrosis visualized. Left Kidney: Renal measurements: 10.0 x 4.2 x 4.5 cm = volume: 96 mL. Echogenicity within normal limits. No mass or hydronephrosis visualized. 12 mm calculus seen in the lower pole. Bladder: Appears normal for degree of bladder distention. Other: Minimal perihepatic and pelvic floor ascites is present. Nodular patent contours consistent with cirrhosis. IMPRESSION: 1. No acute abnormality of the kidneys. 2. 12 mm nonobstructing left renal calculus. Electronically Signed   By: Elester Grim M.D.   On: 07/17/2023 11:37     Signature  -   Lynnwood Sauer M.D on 07/18/2023 at 9:59 AM   -  To page go to [email or web address]

## 2023-07-18 NOTE — Progress Notes (Signed)
 Patient gave RN permission to talk to Delmus Ferri, significant other to provide update of plan of care. RN answered all questions in concern to plan of care. Patient made aware.

## 2023-07-18 NOTE — Op Note (Signed)
 [facility] Patient Name: Marvin Ortega Procedure Date : 07/18/2023 MRN: [id number] Attending MD: Harry Lindau , MD, [id number] Date of Birth: Mar 09, 1951 CSN: [id number] Age: 73 Admit Type: Inpatient Procedure:                Colonoscopy Indications:              Generalized abdominal pain, Hematochezia, Abnormal                            CT of the GI tract (inflammation in the ascending                            colon), Change in bowel habits, Weight loss Providers:                Harry Lindau, MD, Bradley Caffey, Rinda Cheers,                            Technician Referring MD:              Medicines:                Monitored Anesthesia Care Complications:            No immediate complications. Estimated Blood Loss:     Estimated blood loss was minimal. Procedure:                Pre-Anesthesia Assessment:                           - Prior to the procedure, a History and Physical                            was performed, and patient medications and                            allergies were reviewed. The patient's tolerance of                            previous anesthesia was also reviewed. The risks                            and benefits of the procedure and the sedation                            options and risks were discussed with the patient.                            All questions were answered, and informed consent                            was obtained. Prior Anticoagulants: The patient has                            taken no anticoagulant or antiplatelet agents. ASA  Grade Assessment: III - A patient with severe                            systemic disease. After reviewing the risks and                            benefits, the patient was deemed in satisfactory                            condition to undergo the procedure.                           After obtaining informed consent, the colonoscope                             was passed under direct vision. Throughout the                            procedure, the patient's blood pressure, pulse, and                            oxygen saturations were monitored continuously. The                            [id number]-HQ190TL ([id number]) Olympus peds colonoscope was                            introduced through the anus and advanced to the 10                            cm into the ileum. The colonoscopy was performed                            without difficulty. The patient tolerated the                            procedure well. The quality of the bowel                            preparation was fair. The terminal ileum, ileocecal                            valve, appendiceal orifice, and rectum were                            photographed. Scope In: 9:35:35 AM Scope Out: 9:48:44 AM Scope Withdrawal Time: 0 hours 11 minutes 8 seconds  Total Procedure Duration: 0 hours 13 minutes 9 seconds  Findings:      Hemorrhoids were found on perianal exam.      Multiple large-mouthed and small-mouthed diverticula were found in the       entire colon. Lavage of the area was performed using copious amounts of       sterile water, resulting in clearance with adequate visualization.      A  moderate amount of semi-liquid stool was found in the entire colon.       Lavage of the area was performed using copious amounts of sterile water,       resulting in clearance with adequate visualization.      The visualized mucosa was otherwise normal-appearing throughout the       colon. No areas of active bleeding or stigmata of recent bleeding. No       blood noted throughout the lower GI lumen. Biopsies were taken with a       cold forceps for histology. Estimated blood loss was minimal.      The terminal ileum appeared normal.      Non-bleeding internal hemorrhoids were found during retroflexion. The       hemorrhoids were Grade II (internal hemorrhoids that prolapse but reduce        spontaneously). Impression:               - Preparation of the colon was fair, but adequate                            for the purposes of this study.                           - Hemorrhoids found on perianal exam.                           - Diverticulosis in the entire examined colon.                           - Stool in the entire examined colon.                           - Normal mucosa in the entire examined colon.                            Biopsied.                           - The examined portion of the ileum was normal.                           - Non-bleeding internal hemorrhoids. Recommendation:           - Patient has a contact number available for                            emergencies. The signs and symptoms of potential                            delayed complications were discussed with the                            patient. Return to normal activities tomorrow.                            Written discharge instructions were provided to the  patient.                           - Advance diet as tolerated.                           - Continue present medications.                           - Await pathology results.                           - Inpatient GI service will sign off at this time.                            Please do not hesitate to contact us  with                            additional questions or concerns. Procedure Code(s):        --- Professional ---                           [id number], Colonoscopy, flexible; with biopsy, single                            or multiple Diagnosis Code(s):        --- Professional ---                           K64.1, Second degree hemorrhoids                           R10.84, Generalized abdominal pain                           K92.1, Melena (includes Hematochezia)                           R19.4, Change in bowel habit                           R63.4, Abnormal weight loss                           K57.30,  Diverticulosis of large intestine without                            perforation or abscess without bleeding                           R93.3, Abnormal findings on diagnostic imaging of                            other parts of digestive tract CPT copyright 2022 [employer]. All rights reserved. The codes documented in this report are preliminary and upon coder review may  be revised to meet current compliance requirements. Harry Lindau, MD 07/18/2023 10:11:40 AM Number of Addenda: 0

## 2023-07-18 NOTE — Anesthesia Procedure Notes (Signed)
 Procedure Name: MAC Date/Time: 07/18/2023 9:13 AM  Performed by: Zita Hind, CRNAPre-anesthesia Checklist: Patient identified, Emergency Drugs available, Suction available and Patient being monitored Patient Re-evaluated:Patient Re-evaluated prior to induction Oxygen Delivery Method: Nasal cannula Preoxygenation: Pre-oxygenation with 100% oxygen Induction Type: IV induction Dental Injury: Teeth and Oropharynx as per pre-operative assessment

## 2023-07-18 NOTE — Progress Notes (Signed)
 Patient is alert and oriented x4. He is scheduled for an EGD and colonoscopy today. Consent signed and stool is running clear/bile. Denied pain.

## 2023-07-18 NOTE — Anesthesia Postprocedure Evaluation (Signed)
 Anesthesia Post Note  Patient: Marvin Ortega  Procedure(s) Performed: COLONOSCOPY EGD (ESOPHAGOGASTRODUODENOSCOPY)     Patient location during evaluation: Endoscopy Anesthesia Type: MAC Level of consciousness: sedated, patient cooperative and oriented Pain management: pain level controlled Vital Signs Assessment: post-procedure vital signs reviewed and stable Respiratory status: spontaneous breathing, nonlabored ventilation, respiratory function stable and patient connected to nasal cannula oxygen Cardiovascular status: blood pressure returned to baseline and stable Postop Assessment: no apparent nausea or vomiting Anesthetic complications: no   No notable events documented.  Last Vitals:  Vitals:   07/18/23 1000 07/18/23 1010  BP: 114/73 92/70  Pulse: 82 82  Resp: (!) 21 17  Temp:    SpO2: 91% 96%    Last Pain:  Vitals:   07/18/23 1010  TempSrc:   PainSc: 0-No pain                 Nikeya Maxim,E. Mallerie Blok

## 2023-07-19 ENCOUNTER — Encounter (HOSPITAL_COMMUNITY): Payer: Self-pay | Admitting: Gastroenterology

## 2023-07-19 DIAGNOSIS — K529 Noninfective gastroenteritis and colitis, unspecified: Secondary | ICD-10-CM | POA: Diagnosis not present

## 2023-07-19 LAB — CBC WITH DIFFERENTIAL/PLATELET
Abs Immature Granulocytes: 0 10*3/uL (ref 0.00–0.07)
Basophils Absolute: 0 10*3/uL (ref 0.0–0.1)
Basophils Relative: 0 %
Eosinophils Absolute: 0.1 10*3/uL (ref 0.0–0.5)
Eosinophils Relative: 2 %
HCT: 27.3 % — ABNORMAL LOW (ref 39.0–52.0)
Hemoglobin: 9.3 g/dL — ABNORMAL LOW (ref 13.0–17.0)
Lymphocytes Relative: 6 %
Lymphs Abs: 0.4 10*3/uL — ABNORMAL LOW (ref 0.7–4.0)
MCH: 38.4 pg — ABNORMAL HIGH (ref 26.0–34.0)
MCHC: 34.1 g/dL (ref 30.0–36.0)
MCV: 112.8 fL — ABNORMAL HIGH (ref 80.0–100.0)
Monocytes Absolute: 0.1 10*3/uL (ref 0.1–1.0)
Monocytes Relative: 1 %
Neutro Abs: 6.7 10*3/uL (ref 1.7–7.7)
Neutrophils Relative %: 91 %
Platelets: 116 10*3/uL — ABNORMAL LOW (ref 150–400)
RBC: 2.42 MIL/uL — ABNORMAL LOW (ref 4.22–5.81)
WBC: 7.4 10*3/uL (ref 4.0–10.5)
nRBC: 0 % (ref 0.0–0.2)
nRBC: 1 /100{WBCs} — ABNORMAL HIGH

## 2023-07-19 LAB — COMPREHENSIVE METABOLIC PANEL WITH GFR
ALT: 8 U/L (ref 0–44)
AST: 21 U/L (ref 15–41)
Albumin: 1.7 g/dL — ABNORMAL LOW (ref 3.5–5.0)
Alkaline Phosphatase: 37 U/L — ABNORMAL LOW (ref 38–126)
Anion gap: 7 (ref 5–15)
BUN: 17 mg/dL (ref 8–23)
CO2: 15 mmol/L — ABNORMAL LOW (ref 22–32)
Calcium: 7.9 mg/dL — ABNORMAL LOW (ref 8.9–10.3)
Chloride: 117 mmol/L — ABNORMAL HIGH (ref 98–111)
Creatinine, Ser: 1.24 mg/dL (ref 0.61–1.24)
GFR, Estimated: >60 mL/min (ref >60)
Glucose, Bld: 72 mg/dL (ref 70–99)
Potassium: 4 mmol/L (ref 3.5–5.1)
Sodium: 139 mmol/L (ref 135–145)
Total Bilirubin: 0.8 mg/dL (ref 0.0–1.2)
Total Protein: 4.9 g/dL — ABNORMAL LOW (ref 6.5–8.1)

## 2023-07-19 LAB — PROCALCITONIN: Procalcitonin: 0.17 ng/mL

## 2023-07-19 LAB — C-REACTIVE PROTEIN: CRP: 0.9 mg/dL (ref <1.0)

## 2023-07-19 LAB — BRAIN NATRIURETIC PEPTIDE: B Natriuretic Peptide: 133.1 pg/mL — ABNORMAL HIGH (ref 0.0–100.0)

## 2023-07-19 LAB — PHOSPHORUS: Phosphorus: 2.7 mg/dL (ref 2.5–4.6)

## 2023-07-19 LAB — MAGNESIUM: Magnesium: 1.8 mg/dL (ref 1.7–2.4)

## 2023-07-19 MED ORDER — OXYCODONE HCL 5 MG PO TABS: 5.0000 mg | ORAL_TABLET | Freq: Once | ORAL | Status: DC

## 2023-07-19 MED ORDER — ALBUMIN HUMAN 25 % IV SOLN
50.0000 g | Freq: Two times a day (BID) | INTRAVENOUS | Status: AC
  Administered 2023-07-19 (×2): 50 g via INTRAVENOUS
  Filled 2023-07-19 (×2): qty 200

## 2023-07-19 MED ORDER — THIAMINE MONONITRATE 100 MG PO TABS
100.0000 mg | ORAL_TABLET | Freq: Every day | ORAL | Status: AC
  Administered 2023-07-20 – 2023-07-21 (×2): 100 mg via ORAL
  Filled 2023-07-19 (×2): qty 1

## 2023-07-19 MED ORDER — ACETAMINOPHEN 325 MG PO TABS
650.0000 mg | ORAL_TABLET | Freq: Four times a day (QID) | ORAL | Status: DC | PRN
  Administered 2023-07-20: 650 mg via ORAL
  Filled 2023-07-19: qty 2

## 2023-07-19 MED ORDER — OXYCODONE HCL 5 MG PO TABS: 5.0000 mg | ORAL_TABLET | Freq: Once | ORAL | Status: DC | PRN

## 2023-07-19 MED ORDER — NALOXONE HCL 0.4 MG/ML IJ SOLN: 0.4000 mg | INTRAMUSCULAR | Status: DC | PRN

## 2023-07-19 NOTE — Plan of Care (Signed)
  Problem: Education: Goal: Knowledge of General Education information will improve Description: Including pain rating scale, medication(s)/side effects and non-pharmacologic comfort measures 07/19/2023 0417 by Jerie Montana, RN Outcome: Progressing 07/19/2023 0417 by Jerie Montana, RN Outcome: Progressing   Problem: Health Behavior/Discharge Planning: Goal: Ability to manage health-related needs will improve 07/19/2023 0417 by Jerie Montana, RN Outcome: Progressing 07/19/2023 0417 by Jerie Montana, RN Outcome: Progressing   Problem: Clinical Measurements: Goal: Ability to maintain clinical measurements within normal limits will improve 07/19/2023 0417 by Jerie Montana, RN Outcome: Progressing 07/19/2023 0417 by Jerie Montana, RN Outcome: Progressing Goal: Will remain free from infection 07/19/2023 0417 by Jerie Montana, RN Outcome: Progressing 07/19/2023 0417 by Jerie Montana, RN Outcome: Progressing Goal: Diagnostic test results will improve 07/19/2023 0417 by Jerie Montana, RN Outcome: Progressing 07/19/2023 0417 by Jerie Montana, RN Outcome: Progressing Goal: Respiratory complications will improve 07/19/2023 0417 by Jerie Montana, RN Outcome: Progressing 07/19/2023 0417 by Jerie Montana, RN Outcome: Progressing Goal: Cardiovascular complication will be avoided 07/19/2023 0417 by Jerie Montana, RN Outcome: Progressing 07/19/2023 0417 by Jerie Montana, RN Outcome: Progressing   Problem: Activity: Goal: Risk for activity intolerance will decrease 07/19/2023 0417 by Jerie Montana, RN Outcome: Progressing 07/19/2023 0417 by Jerie Montana, RN Outcome: Progressing   Problem: Nutrition: Goal: Adequate nutrition will be maintained 07/19/2023 0417 by Jerie Montana, RN Outcome: Progressing 07/19/2023 0417 by Jerie Montana, RN Outcome: Progressing   Problem: Coping: Goal: Level of anxiety will decrease 07/19/2023 0417 by Jerie Montana, RN Outcome: Progressing 07/19/2023 0417 by Jerie Montana, RN Outcome: Progressing   Problem: Elimination: Goal: Will not experience complications related to bowel motility Outcome: Progressing Goal: Will not experience complications related to urinary retention Outcome: Progressing   Problem: Pain Managment: Goal: General experience of comfort will improve and/or be controlled Outcome: Progressing   Problem: Safety: Goal: Ability to remain free from injury will improve Outcome: Progressing   Problem: Skin Integrity: Goal: Risk for impaired skin integrity will decrease Outcome: Progressing

## 2023-07-19 NOTE — Evaluation (Signed)
 Clinical/Bedside Swallow Evaluation Patient Details  Name: Marvin Ortega MRN: [id number] Date of Birth: 03-Jul-1950  Today's Date: 07/19/2023 Time: SLP Start Time (ACUTE ONLY): 4098 SLP Stop Time (ACUTE ONLY): 0942 SLP Time Calculation (min) (ACUTE ONLY): 13 min  Past Medical History:  Past Medical History:  Diagnosis Date   Alcoholic cirrhosis of liver (HCC)    Anemia    Atrial fibrillation (HCC)    Atrial fibrillation (HCC)    BPH (benign prostatic hyperplasia)    COPD (chronic obstructive pulmonary disease) (HCC)    Dieulafoy lesion of stomach    GERD (gastroesophageal reflux disease)    h/o   GIB (gastrointestinal bleeding) 02/15/2018   Hemorrhagic shock (HCC)    History of kidney stones    Hypertension    Pre-diabetes    Sepsis (HCC)    Upper GI bleed    Past Surgical History:  Past Surgical History:  Procedure Laterality Date   CYSTOSCOPY W/ RETROGRADES Right 11/23/2018   Procedure: CYSTOSCOPY WITH RETROGRADE PYELOGRAM;  Surgeon: Geraline Knapp, MD;  Location: [facility] ORS;  Service: Urology;  Laterality: Right;   CYSTOSCOPY WITH HOLMIUM LASER LITHOTRIPSY  11/30/2018   Procedure: CYSTOSCOPY WITH HOLMIUM LASER LITHOTRIPSY;  Surgeon: Geraline Knapp, MD;  Location: [facility];  Service: Urology;;   CYSTOSCOPY WITH LITHOLAPAXY N/A 11/23/2018   Procedure: CYSTOSCOPY WITH LITHOLAPAXY;  Surgeon: Geraline Knapp, MD;  Location: [facility];  Service: Urology;  Laterality: N/A;   CYSTOSCOPY WITH LITHOLAPAXY N/A 11/30/2018   Procedure: CYSTOSCOPY WITH LITHOLAPAXY;  Surgeon: Geraline Knapp, MD;  Location: [facility] OR[facility];  Service: Urology;  Laterality: N/A;   CYSTOSCOPY/URETEROSCOPY/HOLMIUM LASER/STENT PLACEMENT Right 11/23/2018   Procedure: CYSTOSCOPY/URETEROSCOPY/HOLMIUM LASER/STENT PLACEMENT;  Surgeon: Geraline Knapp, MD;  Location: [facility];  Service: Urology;  Laterality: Right;   ESOPHAGOGASTRODUODENOSCOPY N/A 02/15/2018   Procedure: ESOPHAGOGASTRODUODENOSCOPY (EGD);  Surgeon: Selena Daily, MD;  Location: [facility] END[facility]COPY;  Service: Gastroenterology;  Laterality: N/A;   TRANSURETHRAL RESECTION OF PROSTATE N/A 05/24/2019   Procedure: TRANSURETHRAL RESECTION OF THE PROSTATE (TURP);  Surgeon: Geraline Knapp, MD;  Location: [facility] ORS;  Service: Urology;  Laterality: N/A;   HPI:  73 y.o. male presented 5/7 to the ED with complaints of generalized weakness/fatigue.  Failure to thrive and Thickened cecum , presumed colitis. Pt underwent EGD and colonoscopy 5/10 revealing: 5 cm hiatal hernia, gastritis, duodenal ulcers, diverticulosis with internal hemorrhoids. PMH: alcoholic liver disease, anemia, paroxysmal A-fib not on anticoagulation due to presumed alcohol use, BPH, COPD, GERD, history of GI bleed, hypertension, prediabetes.    Assessment / Plan / Recommendation  Clinical Impression  Pt reports soreness of his teeth s/p procedures on previous date and his tongue is noted to be tremulous upon protrusion. His dentition overall is limited and with poor quality but he says this does not limit him from consuming any specific textures. Otherwise, his oral motor exam is [facility], he has no overt signs of an oropharyngeal dysphagia, and he denies any subjective complaints. No signs of aspiration. Suspect more of a GI source for his poor intake, and they have already evaluated him. SLP provided education about esophageal precautions but would defer any additional f/u to GI.   SLP Visit Diagnosis: Dysphagia, unspecified (R13.10)    Aspiration Risk       Diet Recommendation Regular;Thin liquid    Liquid Administration via: Cup;Straw Medication Administration: Whole meds with liquid Supervision: Patient able to self feed Compensations: Slow rate;Small sips/bites;Follow solids with liquid Postural Changes: Seated upright  at 90 degrees;Remain upright for at least 30 minutes after po intake    Other  Recommendations Oral Care Recommendations: Oral care BID    Recommendations for follow  up therapy are one component of a multi-disciplinary discharge planning process, led by the attending physician.  Recommendations may be updated based on patient status, additional functional criteria and insurance authorization.  Follow up Recommendations No SLP follow up      Assistance Recommended at Discharge    Functional Status Assessment Patient has not had a recent decline in their functional status  Frequency and Duration            Prognosis        Swallow Study   General HPI: 73 y.o. male presented 5/7 to the ED with complaints of generalized weakness/fatigue.  Failure to thrive and Thickened cecum , presumed colitis. Pt underwent EGD and colonoscopy 5/10 revealing: 5 cm hiatal hernia, gastritis, duodenal ulcers, diverticulosis with internal hemorrhoids. PMH: alcoholic liver disease, anemia, paroxysmal A-fib not on anticoagulation due to presumed alcohol use, BPH, COPD, GERD, history of GI bleed, hypertension, prediabetes. Type of Study: Bedside Swallow Evaluation Previous Swallow Assessment: none in chart from SLP - see HPI for GI findings Diet Prior to this Study: Regular;Thin liquids (Level 0) Temperature Spikes Noted: No Respiratory Status: Room air History of Recent Intubation: No Behavior/Cognition: Alert;Cooperative;Pleasant mood Oral Cavity Assessment: Within Functional Limits Oral Care Completed by SLP: No Oral Cavity - Dentition: Poor condition;Missing dentition;Other (Comment) (no upper dentition and pt says he lost his upper dentures a while ago so does not use them for eating; does say his teeth are sore s/p procedures on previous date) Vision: Functional for self-feeding Self-Feeding Abilities: Able to feed self Patient Positioning: Upright in bed Baseline Vocal Quality: Normal Volitional Cough: Weak Volitional Swallow: Able to elicit    Oral/Motor/Sensory Function Overall Oral Motor/Sensory Function: Other (comment) (tongue is tremulous upon protrusion;  otherwise WFL)   Ice Chips Ice chips: Not tested   Thin Liquid Thin Liquid: Within functional limits Presentation: Cup;Self Fed;Straw    Nectar Thick Nectar Thick Liquid: Not tested   Honey Thick Honey Thick Liquid: Not tested   Puree Puree: Within functional limits Presentation: Self Fed;Spoon   Solid     Solid: Within functional limits Presentation: Self Fed      Beth Brooke., M.A. CCC-SLP Acute Rehabilitation Services Office: [phone]  Secure chat preferred  07/19/2023,11:05 AM

## 2023-07-19 NOTE — Progress Notes (Signed)
 Mobility Specialist Progress Note:   07/19/23 1000  Mobility  Activity Ambulated with assistance in hallway  Level of Assistance Contact guard assist, steadying assist  Assistive Device Other (Comment) (IV Pole)  Distance Ambulated (ft) 60 ft  Activity Response Tolerated well  Mobility Referral Yes  Mobility visit 1 Mobility  Mobility Specialist Start Time (ACUTE ONLY) 0840  Mobility Specialist Stop Time (ACUTE ONLY) 0854  Mobility Specialist Time Calculation (min) (ACUTE ONLY) 14 min    Pre Mobility: 116 HR During Mobility: 150 HR Post Mobility:  106 HR  Pt received in chair and agreeable. HR peaked at 150 bpm during ambulation. Took x1 standing rest break. Asymptomatic throughout w/ no complaints. Pt left in bed with call bell and all needs met. Bed alarm on.  [facility]'Vante Nolon Baxter Mobility Specialist Please contact via Special educational needs teacher or Rehab office at [phone]

## 2023-07-19 NOTE — Progress Notes (Signed)
 PROGRESS NOTE                                                                                                                                                                                                             Patient Demographics:    Marvin Ortega, is a 73 y.o. male, DOB - 1950/10/11, WUJ:[id number]  Outpatient Primary MD for the patient is Entzminger, Iantha Mainland, MD    LOS - 2  Admit date - 07/15/2023    Chief Complaint  Patient presents with   Fatigue       Brief Narrative (HPI from H&P)    73 y.o. male with medical history significant of  alcoholic liver disease, anemia, paroxysmal A-fib not on anticoagulation due to presumed alcohol use, BPH, COPD, GERD, history of GI bleed, hypertension, prediabetes presented to the ED with complaints of generalized weakness/fatigue and poor p.o. intake iso thickened cecum c/f infection vs malignancy on imaging.    Subjective:   Patient in bed, appears comfortable, denies any headache, no fever, no chest pain or pressure, no shortness of breath , no abdominal pain. No new focal weakness.   Assessment  & Plan :    Pt with fatigue and poor PO intake, as well as low albumin, appears frail and cachectic, so has significant anemia, imaging suggestive of thickening in the cecum colitis versus malignancy. Presentation suspicious for severe protein calorie malnutrition, underwent EGD and colonoscopy on 07/18/2023, EGD showing 2 duodenal bulb ulcers but they were not bleeding, colonoscopy showed diverticulosis with internal hemorrhoids which were likely the source of bleeding, he is s/p 1 unit of packed RBC transfusion on 07/17/2023, question intermittent chronic occult blood loss.  Surprisingly his anemia panel appears stable for now.  Continue gentle IV fluids and midodrine for blood pressure support.  Encourage oral intake.   #Thickened cecum - #Presumed colitis -  Please review above,  GI on board, placed on Rocephin  and Flagyl  in case this is colitis.   #AKI - Cr 1.9 on admission. Baseline 1.0. Presumed pre-renal.  Transfused, hydrated, renal function improving renal ultrasound nonacute.  #Macrocytic anemia -  Stable B12, folate and TSH.   Hypomagnesemia.  Replaced.  Severe PCM.  Protein supplement.  Dyslipidemia.  On statin.       Condition - Extremely Guarded  Family Communication  : Niece Gabriel John  [phone]  over the phone on 07/17/2023, message left 07/19/2023 at 9 AM  Code Status :  Full  Consults  :  GI  PUD Prophylaxis : PPI   Procedures  :     EGD & colonoscopy on 07/18/2023.    EGD with 2 duodenal bulb ulcers, both clean-based and without high-grade stigmata of bleeding (biopsied), gastritis (biopsied), large 5 cm hiatal hernia, but no esophageal varices. Colonoscopy with diverticulosis and internal hemorrhoids (likely source of bleeding was hemorrhoidal) without any blood throughout the GI lumen. Normal terminal ileum   CT Head - Non acute  CT Abd Pelvis - Mucosal thickening of the cecum and ascending colon with subtle stranding of the mesenteric fat in the right lower quadrant with normal-appearing cecal appendix could correlate with colitis. *Heterogeneous nodular appearing liver could correlate with hepatocellular disease such as cirrhosis without parenchymal lesions or biliary dilatation. *Multiple gallstones without gallbladder inflammatory changes. *Small amount of fluid surrounding the liver correlates with minimal ascites. *Left nephrolithiasis without hydronephrosis. *Aortic atherosclerosis.      Disposition Plan  :    Status is: Observation   DVT Prophylaxis  :    SCDs  Lab Results  Component Value Date   PLT 116 (L) 07/19/2023    Diet :  Diet Order             DIET SOFT Fluid consistency: Thin  Diet effective now                    Inpatient Medications  Scheduled Meds:  (feeding supplement) PROSource Plus  30 mL  Oral BID AC & HS   atorvastatin   20 mg Oral Daily   cyanocobalamin   1,000 mcg Oral Daily   feeding supplement  1 Container Oral TID BM   finasteride   5 mg Oral q morning   metroNIDAZOLE   500 mg Oral Q12H   midodrine  10 mg Oral TID WC   multivitamin with minerals  1 tablet Oral Daily   pantoprazole   40 mg Oral BID   sucralfate  1 g Oral TID WC & HS   thiamine   100 mg Per Tube Daily   Continuous Infusions:  cefTRIAXone  (ROCEPHIN )  IV 2 g (07/18/23 1249)   PRN Meds:.albuterol , ondansetron   Antibiotics  :    Anti-infectives (From admission, onward)    Start     Dose/Rate Route Frequency Ordered Stop   07/16/23 1800  cefTRIAXone  (ROCEPHIN ) 2 g in sodium chloride  0.9 % 100 mL IVPB        2 g 200 mL/hr over 30 Minutes Intravenous Daily 07/16/23 0921     07/16/23 1000  metroNIDAZOLE  (FLAGYL ) tablet 500 mg        500 mg Oral Every 12 hours 07/16/23 0921     07/15/23 2100  metroNIDAZOLE  (FLAGYL ) IVPB 500 mg        500 mg 100 mL/hr over 60 Minutes Intravenous  Once 07/15/23 2059 07/15/23 2231   07/15/23 1730  cefTRIAXone  (ROCEPHIN ) 2 g in sodium chloride  0.9 % 100 mL IVPB        2 g 200 mL/hr over 30 Minutes Intravenous  Once 07/15/23 1727 07/15/23 1843         Objective:   Vitals:   07/18/23 2000 07/18/23 2350 07/19/23 0359 07/19/23 0751  BP: 97/63  93/62 (!) 98/57  Pulse: 81   89  Resp: 11  10 (!) 25  Temp: 98.2 F (36.8 C) 98.5 F (36.9 C) 98.1 F (  36.7 C) (!) 97.5 F (36.4 C)  TempSrc: Oral Oral Oral Oral  SpO2: 97%   100%  Weight:      Height:        Wt Readings from Last 3 Encounters:  07/16/23 47 kg  07/14/23 48.2 kg  07/14/23 48.3 kg     Intake/Output Summary (Last 24 hours) at 07/19/2023 0911 Last data filed at 07/18/2023 2225 Gross per 24 hour  Intake 1455.95 ml  Output 250 ml  Net 1205.95 ml     Physical Exam  Frail cachectic elderly African-American male, awake Alert, No new F.N deficits, Normal affect [place].AT,PERRAL Supple Neck, No JVD,    Symmetrical Chest wall movement, Good air movement bilaterally, CTAB RRR,No Gallops,Rubs or new Murmurs,  +ve B.Sounds, Abd Soft, No tenderness,   No Cyanosis, Clubbing or edema      Data Review:    Recent Labs  Lab 07/15/23 1613 07/16/23 0920 07/17/23 0621 07/18/23 0637 07/19/23 0634  WBC 6.5 5.5 6.0 6.3 7.4  HGB 8.0* 7.7* 7.6* 9.7* 9.3*  HCT 24.0* 23.5* 22.5* 28.5* 27.3*  PLT 152 153 138* 139* 116*  MCV 125.7* 127.7* 124.3* 110.0* 112.8*  MCH 41.9* 41.8* 42.0* 37.5* 38.4*  MCHC 33.3 32.8 33.8 34.0 34.1  RDW 13.2 13.3 13.4 Not Measured Not Measured  LYMPHSABS 1.5  --   --  0.3* 0.4*  MONOABS 0.7  --   --  0.1 0.1  EOSABS 0.0  --   --  0.1 0.1  BASOSABS 0.0  --   --  0.0 0.0    Recent Labs  Lab 07/14/23 1317 07/15/23 1613 07/16/23 0920 07/16/23 1519 07/17/23 0621 07/17/23 1016 07/18/23 0637 07/19/23 0634  NA 132* 134*  --   --  136  --  138 139  K 5.4* 5.2*  --   --  4.3  --  4.1 4.0  CL 109 107  --   --  112*  --  115* 117*  CO2 15* 14*  --   --  17*  --  15* 15*  ANIONGAP 8 13  --   --  7  --  8 7  GLUCOSE 68* 73  --   --  71  --  87 72  BUN 33* 34*  --   --  22  --  20 17  CREATININE 2.12* 1.94* 1.60*  --  1.50*  --  1.38* 1.24  AST  --  26  --   --  23  --  25 21  ALT  --  9  --   --  10  --  9 8  ALKPHOS  --  68  --   --  42  --  41 37*  BILITOT  --  0.6  --   --  0.9  --  1.4* 0.8  ALBUMIN  --  2.6*  --   --  1.8*  --  1.8* 1.7*  CRP  --   --   --  0.8 0.6  --  0.7 0.9  PROCALCITON  --   --   --   --  0.34  --  0.27 0.17  LATICACIDVEN  --  1.7  --   --   --   --   --   --   INR  --  1.3*  --   --   --   --   --   --   TSH  --   --   --   --   --  2.521  --   --   AMMONIA  --  21  --   --   --   --   --   --   BNP  --   --   --   --  91.5  --  145.0* 133.1*  MG  --   --   --   --  1.3*  --  2.3 1.8  PHOS  --   --   --   --   --   --  2.9 2.7  CALCIUM  8.7* 8.9  --   --  8.3*  --  8.3* 7.9*      Recent Labs  Lab 07/14/23 1317 07/15/23 1613  07/16/23 1519 07/17/23 0621 07/17/23 1016 07/18/23 0637 07/19/23 0634  CRP  --   --  0.8 0.6  --  0.7 0.9  PROCALCITON  --   --   --  0.34  --  0.27 0.17  LATICACIDVEN  --  1.7  --   --   --   --   --   INR  --  1.3*  --   --   --   --   --   TSH  --   --   --   --  2.521  --   --   AMMONIA  --  21  --   --   --   --   --   BNP  --   --   --  91.5  --  145.0* 133.1*  MG  --   --   --  1.3*  --  2.3 1.8  CALCIUM  8.7* 8.9  --  8.3*  --  8.3* 7.9*    --------------------------------------------------------------------------------------------------------------- Lab Results  Component Value Date   CHOL 129 08/17/2012   HDL 91 (A) 08/17/2012   LDLCALC 24 08/17/2012   TRIG 70 08/17/2012   CHOLHDL 1.9 07/20/2009    Lab Results  Component Value Date   HGBA1C 6.0 (H) 07/20/2021   Recent Labs    07/17/23 1016  TSH 2.521  FREET4 0.83   Recent Labs    07/16/23 1006 07/17/23 0621  VITAMINB12 3,394* 3,026*  FOLATE  --  6.6  FERRITIN  --  168  TIBC  --  175*  IRON  --  57  RETICCTPCT  --  2.6   ------------------------------------------------------------------------------------------------------------------ Cardiac Enzymes No results for input(s): "CKMB", "TROPONINI", "MYOGLOBIN" in the last 168 hours.  Invalid input(s): "CK"  Micro Results Recent Results (from the past 240 hours)  Blood culture (routine x 2)     Status: None (Preliminary result)   Collection Time: 07/15/23  4:05 PM   Specimen: BLOOD  Result Value Ref Range Status   Specimen Description   Final    BLOOD LEFT ANTECUBITAL Performed at [facility], [address], [place], [place] [address]    Special Requests   Final    Blood Culture adequate volume BOTTLES DRAWN AEROBIC AND ANAEROBIC Performed at [facility], [address], [place], [place] [address]    Culture   Final    NO GROWTH 4 DAYS Performed at [facility], [address], [place],  [place] [address]    Report Status PENDING  Incomplete  Blood culture (routine x 2)     Status: None (Preliminary result)   Collection Time: 07/15/23  4:10 PM   Specimen: BLOOD  Result Value Ref Range Status   Specimen Description  Final    BLOOD RIGHT ANTECUBITAL Performed at [facility], [address], [place], [place] [address]    Special Requests   Final    Blood Culture adequate volume BOTTLES DRAWN AEROBIC AND ANAEROBIC Performed at [facility], [address], [place], [place] [address]    Culture   Final    NO GROWTH 4 DAYS Performed at [facility], [address] [place], [place] [address]    Report Status PENDING  Incomplete    Radiology Report US  RENAL Result Date: 07/17/2023 CLINICAL DATA:  Acute kidney injury EXAM: RENAL / URINARY TRACT ULTRASOUND COMPLETE COMPARISON:  CT abdomen pelvis 07/15/2023 FINDINGS: Right Kidney: Renal measurements: 9.4 x 5.2 x 4.5 cm = volume: 112 mL. Echogenicity within normal limits. No mass or hydronephrosis visualized. Left Kidney: Renal measurements: 10.0 x 4.2 x 4.5 cm = volume: 96 mL. Echogenicity within normal limits. No mass or hydronephrosis visualized. 12 mm calculus seen in the lower pole. Bladder: Appears normal for degree of bladder distention. Other: Minimal perihepatic and pelvic floor ascites is present. Nodular patent contours consistent with cirrhosis. IMPRESSION: 1. No acute abnormality of the kidneys. 2. 12 mm nonobstructing left renal calculus. Electronically Signed   By: Elester Grim M.D.   On: 07/17/2023 11:37     Signature  -   Lynnwood Sauer M.D on 07/19/2023 at 9:11 AM   -  To page go to [email or web address]

## 2023-07-20 DIAGNOSIS — N179 Acute kidney failure, unspecified: Secondary | ICD-10-CM

## 2023-07-20 DIAGNOSIS — D7589 Other specified diseases of blood and blood-forming organs: Secondary | ICD-10-CM

## 2023-07-20 DIAGNOSIS — K625 Hemorrhage of anus and rectum: Secondary | ICD-10-CM | POA: Diagnosis not present

## 2023-07-20 DIAGNOSIS — K269 Duodenal ulcer, unspecified as acute or chronic, without hemorrhage or perforation: Secondary | ICD-10-CM

## 2023-07-20 DIAGNOSIS — D62 Acute posthemorrhagic anemia: Secondary | ICD-10-CM | POA: Diagnosis not present

## 2023-07-20 DIAGNOSIS — K641 Second degree hemorrhoids: Secondary | ICD-10-CM | POA: Diagnosis not present

## 2023-07-20 DIAGNOSIS — R188 Other ascites: Secondary | ICD-10-CM

## 2023-07-20 DIAGNOSIS — K529 Noninfective gastroenteritis and colitis, unspecified: Secondary | ICD-10-CM | POA: Diagnosis not present

## 2023-07-20 LAB — COMPREHENSIVE METABOLIC PANEL WITH GFR
ALT: 7 U/L (ref 0–44)
AST: 17 U/L (ref 15–41)
Albumin: 3 g/dL — ABNORMAL LOW (ref 3.5–5.0)
Alkaline Phosphatase: 25 U/L — ABNORMAL LOW (ref 38–126)
Anion gap: 11 (ref 5–15)
BUN: 20 mg/dL (ref 8–23)
CO2: 14 mmol/L — ABNORMAL LOW (ref 22–32)
Calcium: 8.4 mg/dL — ABNORMAL LOW (ref 8.9–10.3)
Chloride: 110 mmol/L (ref 98–111)
Creatinine, Ser: 1.42 mg/dL — ABNORMAL HIGH (ref 0.61–1.24)
GFR, Estimated: 52 mL/min — ABNORMAL LOW (ref >60)
Glucose, Bld: 76 mg/dL (ref 70–99)
Potassium: 3.7 mmol/L (ref 3.5–5.1)
Sodium: 135 mmol/L (ref 135–145)
Total Bilirubin: 0.9 mg/dL (ref 0.0–1.2)
Total Protein: 5 g/dL — ABNORMAL LOW (ref 6.5–8.1)

## 2023-07-20 LAB — CBC
HCT: 25.1 % — ABNORMAL LOW (ref 39.0–52.0)
HCT: 22.5 % — ABNORMAL LOW (ref 39.0–52.0)
Hemoglobin: 8.1 g/dL — ABNORMAL LOW (ref 13.0–17.0)
Hemoglobin: 7.4 g/dL — ABNORMAL LOW (ref 13.0–17.0)
MCH: 37.7 pg — ABNORMAL HIGH (ref 26.0–34.0)
MCH: 38.3 pg — ABNORMAL HIGH (ref 26.0–34.0)
MCHC: 32.3 g/dL (ref 30.0–36.0)
MCHC: 32.9 g/dL (ref 30.0–36.0)
MCV: 116.7 fL — ABNORMAL HIGH (ref 80.0–100.0)
MCV: 116.6 fL — ABNORMAL HIGH (ref 80.0–100.0)
Platelets: 114 10*3/uL — ABNORMAL LOW (ref 150–400)
Platelets: 89 10*3/uL — ABNORMAL LOW (ref 150–400)
RBC: 2.15 MIL/uL — ABNORMAL LOW (ref 4.22–5.81)
RBC: 1.93 MIL/uL — ABNORMAL LOW (ref 4.22–5.81)
RDW: 22.8 % — ABNORMAL HIGH (ref 11.5–15.5)
RDW: 22.3 % — ABNORMAL HIGH (ref 11.5–15.5)
WBC: 4.9 10*3/uL (ref 4.0–10.5)
WBC: 5 10*3/uL (ref 4.0–10.5)
nRBC: 0 % (ref 0.0–0.2)
nRBC: 0 % (ref 0.0–0.2)

## 2023-07-20 LAB — BRAIN NATRIURETIC PEPTIDE: B Natriuretic Peptide: 784.4 pg/mL — ABNORMAL HIGH (ref 0.0–100.0)

## 2023-07-20 LAB — PREPARE RBC (CROSSMATCH)

## 2023-07-20 LAB — C-REACTIVE PROTEIN: CRP: 0.5 mg/dL (ref <1.0)

## 2023-07-20 LAB — CBC WITH DIFFERENTIAL/PLATELET
Abs Immature Granulocytes: 0.03 10*3/uL (ref 0.00–0.07)
Basophils Absolute: 0 10*3/uL (ref 0.0–0.1)
Basophils Relative: 1 %
Eosinophils Absolute: 0.1 10*3/uL (ref 0.0–0.5)
Eosinophils Relative: 2 %
HCT: 19.8 % — ABNORMAL LOW (ref 39.0–52.0)
Hemoglobin: 6.6 g/dL — CL (ref 13.0–17.0)
Immature Granulocytes: 1 %
Lymphocytes Relative: 21 %
Lymphs Abs: 1 10*3/uL (ref 0.7–4.0)
MCH: 37.7 pg — ABNORMAL HIGH (ref 26.0–34.0)
MCHC: 33.3 g/dL (ref 30.0–36.0)
MCV: 113.1 fL — ABNORMAL HIGH (ref 80.0–100.0)
Monocytes Absolute: 0.5 10*3/uL (ref 0.1–1.0)
Monocytes Relative: 10 %
Neutro Abs: 3.1 10*3/uL (ref 1.7–7.7)
Neutrophils Relative %: 65 %
Platelets: 83 10*3/uL — ABNORMAL LOW (ref 150–400)
RBC: 1.75 MIL/uL — ABNORMAL LOW (ref 4.22–5.81)
RDW: 22.3 % — ABNORMAL HIGH (ref 11.5–15.5)
WBC: 4.7 10*3/uL (ref 4.0–10.5)
nRBC: 0 % (ref 0.0–0.2)

## 2023-07-20 LAB — CULTURE, BLOOD (ROUTINE X 2)
Culture: NO GROWTH
Culture: NO GROWTH
Special Requests: ADEQUATE
Special Requests: ADEQUATE

## 2023-07-20 LAB — PHOSPHORUS: Phosphorus: 1.7 mg/dL — ABNORMAL LOW (ref 2.5–4.6)

## 2023-07-20 LAB — MAGNESIUM: Magnesium: 1.6 mg/dL — ABNORMAL LOW (ref 1.7–2.4)

## 2023-07-20 LAB — PROCALCITONIN: Procalcitonin: 0.19 ng/mL

## 2023-07-20 LAB — UREA NITROGEN, URINE: Urea Nitrogen, Ur: 529 mg/dL

## 2023-07-20 MED ORDER — SODIUM CHLORIDE 0.9% IV SOLUTION: Freq: Once | INTRAVENOUS | Status: DC

## 2023-07-20 MED ORDER — ALBUMIN HUMAN 25 % IV SOLN
50.0000 g | Freq: Once | INTRAVENOUS | Status: AC
  Administered 2023-07-20: 12.5 g via INTRAVENOUS
  Filled 2023-07-20: qty 200

## 2023-07-20 MED ORDER — MAGNESIUM SULFATE 4 GM/100ML IV SOLN
4.0000 g | Freq: Once | INTRAVENOUS | Status: AC
  Administered 2023-07-20: 4 g via INTRAVENOUS
  Filled 2023-07-20: qty 100

## 2023-07-20 MED ORDER — HYDROCORTISONE ACETATE 25 MG RE SUPP
25.0000 mg | Freq: Two times a day (BID) | RECTAL | Status: AC
  Administered 2023-07-20 – 2023-07-21 (×2): 25 mg via RECTAL
  Filled 2023-07-20 (×4): qty 1

## 2023-07-20 MED ORDER — SODIUM CHLORIDE 0.9 % IV BOLUS
1000.0000 mL | Freq: Once | INTRAVENOUS | Status: AC
  Administered 2023-07-20: 1000 mL via INTRAVENOUS

## 2023-07-20 MED ORDER — POTASSIUM PHOSPHATES 15 MMOLE/5ML IV SOLN
30.0000 mmol | Freq: Once | INTRAVENOUS | Status: AC
  Administered 2023-07-20: 30 mmol via INTRAVENOUS
  Filled 2023-07-20: qty 10

## 2023-07-20 NOTE — Care Management Important Message (Signed)
 Important Message  Patient Details  Name: Marvin Ortega MRN: [id number] Date of Birth: 16-Jun-1950   Important Message Given:  Yes - Medicare IM     Wynonia Hedges 07/20/2023, 3:14 PM

## 2023-07-20 NOTE — Progress Notes (Signed)
 PROGRESS NOTE                                                                                                                                                                                                             Patient Demographics:    Marvin Ortega, is a 73 y.o. male, DOB - 1950-06-05, NGE:[id number]  Outpatient Primary MD for the patient is Entzminger, Iantha Mainland, MD    LOS - 3  Admit date - 07/15/2023    Chief Complaint  Patient presents with   Fatigue       Brief Narrative (HPI from H&P)    73 y.o. male with medical history significant of  alcoholic liver disease, anemia, paroxysmal A-fib not on anticoagulation due to presumed alcohol use, BPH, COPD, GERD, history of GI bleed, hypertension, prediabetes presented to the ED with complaints of generalized weakness/fatigue and poor p.o. intake iso thickened cecum c/f infection vs malignancy on imaging.    Subjective:   Patient in bed, appears comfortable, denies any headache, no fever, no chest pain or pressure, no shortness of breath , no abdominal pain. No focal weakness.   Assessment  & Plan :    Pt with fatigue and poor PO intake, as well as low albumin, appears frail and cachectic, so has significant anemia, imaging suggestive of thickening in the cecum colitis versus malignancy. Presentation suspicious for severe protein calorie malnutrition, underwent EGD and colonoscopy on 07/18/2023, EGD showing 2 duodenal bulb ulcers but they were not bleeding, colonoscopy showed diverticulosis with internal hemorrhoids which were likely the source of bleeding, he is s/p 1 unit of packed RBC transfusion on 07/17/2023, question intermittent chronic occult blood loss.  Surprisingly his anemia panel stable, he was doing well unfortunately had melanotic stools again early morning 07/20/2023 with drop in CBC, clear liquid diet, continue PPI, 2 units of packed RBC again on 07/20/2023.   GI informed.   #Thickened cecum - #Presumed colitis -  Please review above, GI on board, placed on Rocephin  and Flagyl  in case this is colitis.   #AKI - Cr 1.9 on admission. Baseline 1.0. Presumed pre-renal.  Transfused, hydrated, renal function improving renal ultrasound nonacute.  #Macrocytic anemia -  Stable B12, folate and TSH.   Hypomagnesemia hypophosphatemia.  Replaced.  Severe PCM.  Protein supplement.  Dyslipidemia.  On statin.  Condition - Extremely Guarded  Family Communication  : Niece Gabriel John [phone]  over the phone on 07/17/2023, message left 07/19/2023 at 9 AM  Code Status :  Full  Consults  :  GI  PUD Prophylaxis : PPI   Procedures  :     EGD & colonoscopy on 07/18/2023.    EGD with 2 duodenal bulb ulcers, both clean-based and without high-grade stigmata of bleeding (biopsied), gastritis (biopsied), large 5 cm hiatal hernia, but no esophageal varices. Colonoscopy with diverticulosis and internal hemorrhoids (likely source of bleeding was hemorrhoidal) without any blood throughout the GI lumen. Normal terminal ileum   CT Head - Non acute  CT Abd Pelvis - Mucosal thickening of the cecum and ascending colon with subtle stranding of the mesenteric fat in the right lower quadrant with normal-appearing cecal appendix could correlate with colitis. *Heterogeneous nodular appearing liver could correlate with hepatocellular disease such as cirrhosis without parenchymal lesions or biliary dilatation. *Multiple gallstones without gallbladder inflammatory changes. *Small amount of fluid surrounding the liver correlates with minimal ascites. *Left nephrolithiasis without hydronephrosis. *Aortic atherosclerosis.      Disposition Plan  :    Status is: Observation   DVT Prophylaxis  :    SCDs  Lab Results  Component Value Date   PLT 83 (L) 07/20/2023    Diet :  Diet Order             Diet clear liquid Room service appropriate? Yes; Fluid consistency: Thin   Diet effective now                    Inpatient Medications  Scheduled Meds:  (feeding supplement) PROSource Plus  30 mL Oral BID AC & HS   sodium chloride    Intravenous Once   sodium chloride    Intravenous Once   atorvastatin   20 mg Oral Daily   cyanocobalamin   1,000 mcg Oral Daily   feeding supplement  1 Container Oral TID BM   finasteride   5 mg Oral q morning   metroNIDAZOLE   500 mg Oral Q12H   midodrine  10 mg Oral TID WC   multivitamin with minerals  1 tablet Oral Daily   pantoprazole   40 mg Oral BID   sucralfate  1 g Oral TID WC & HS   thiamine   100 mg Oral Daily   Continuous Infusions:  cefTRIAXone  (ROCEPHIN )  IV 2 g (07/19/23 0954)   sodium chloride      PRN Meds:.acetaminophen , albuterol , naLOXone (NARCAN)  injection, ondansetron , oxyCODONE   Antibiotics  :    Anti-infectives (From admission, onward)    Start     Dose/Rate Route Frequency Ordered Stop   07/16/23 1800  cefTRIAXone  (ROCEPHIN ) 2 g in sodium chloride  0.9 % 100 mL IVPB        2 g 200 mL/hr over 30 Minutes Intravenous Daily 07/16/23 0921     07/16/23 1000  metroNIDAZOLE  (FLAGYL ) tablet 500 mg        500 mg Oral Every 12 hours 07/16/23 0921     07/15/23 2100  metroNIDAZOLE  (FLAGYL ) IVPB 500 mg        500 mg 100 mL/hr over 60 Minutes Intravenous  Once 07/15/23 2059 07/15/23 2231   07/15/23 1730  cefTRIAXone  (ROCEPHIN ) 2 g in sodium chloride  0.9 % 100 mL IVPB        2 g 200 mL/hr over 30 Minutes Intravenous  Once 07/15/23 1727 07/15/23 1843         Objective:  Vitals:   07/19/23 2120 07/20/23 0000 07/20/23 0400 07/20/23 0624  BP: 102/63 (!) 85/54 (!) 89/57 (!) 81/50  Pulse: 83 63 74 69  Resp: 12 12 13 15   Temp:  97.7 F (36.5 C) 97.8 F (36.6 C)   TempSrc:  Oral Axillary   SpO2: 96% 100% 97% 95%  Weight:      Height:        Wt Readings from Last 3 Encounters:  07/16/23 47 kg  07/14/23 48.2 kg  07/14/23 48.3 kg     Intake/Output Summary (Last 24 hours) at 07/20/2023 0806 Last  data filed at 07/19/2023 1400 Gross per 24 hour  Intake 650 ml  Output --  Net 650 ml     Physical Exam  Frail cachectic elderly African-American male, awake Alert, No new F.N deficits, Normal affect [place].AT,PERRAL Supple Neck, No JVD,   Symmetrical Chest wall movement, Good air movement bilaterally, CTAB RRR,No Gallops,Rubs or new Murmurs,  +ve B.Sounds, Abd Soft, No tenderness,   No Cyanosis, Clubbing or edema      Data Review:    Recent Labs  Lab 07/15/23 1613 07/16/23 0920 07/17/23 0621 07/18/23 0637 07/19/23 0634 07/20/23 0439  WBC 6.5 5.5 6.0 6.3 7.4 4.7  HGB 8.0* 7.7* 7.6* 9.7* 9.3* 6.6*  HCT 24.0* 23.5* 22.5* 28.5* 27.3* 19.8*  PLT 152 153 138* 139* 116* 83*  MCV 125.7* 127.7* 124.3* 110.0* 112.8* 113.1*  MCH 41.9* 41.8* 42.0* 37.5* 38.4* 37.7*  MCHC 33.3 32.8 33.8 34.0 34.1 33.3  RDW 13.2 13.3 13.4 Not Measured Not Measured 22.3*  LYMPHSABS 1.5  --   --  0.3* 0.4* 1.0  MONOABS 0.7  --   --  0.1 0.1 0.5  EOSABS 0.0  --   --  0.1 0.1 0.1  BASOSABS 0.0  --   --  0.0 0.0 0.0    Recent Labs  Lab 07/15/23 1613 07/16/23 0920 07/16/23 1519 07/17/23 0621 07/17/23 1016 07/18/23 0637 07/19/23 0634 07/20/23 0439  NA 134*  --   --  136  --  138 139 135  K 5.2*  --   --  4.3  --  4.1 4.0 3.7  CL 107  --   --  112*  --  115* 117* 110  CO2 14*  --   --  17*  --  15* 15* 14*  ANIONGAP 13  --   --  7  --  8 7 11   GLUCOSE 73  --   --  71  --  87 72 76  BUN 34*  --   --  22  --  20 17 20   CREATININE 1.94* 1.60*  --  1.50*  --  1.38* 1.24 1.42*  AST 26  --   --  23  --  25 21 17   ALT 9  --   --  10  --  9 8 7   ALKPHOS 68  --   --  42  --  41 37* 25*  BILITOT 0.6  --   --  0.9  --  1.4* 0.8 0.9  ALBUMIN 2.6*  --   --  1.8*  --  1.8* 1.7* 3.0*  CRP  --   --  0.8 0.6  --  0.7 0.9 0.5  PROCALCITON  --   --   --  0.34  --  0.27 0.17 0.19  LATICACIDVEN 1.7  --   --   --   --   --   --   --  INR 1.3*  --   --   --   --   --   --   --   TSH  --   --   --   --  2.521   --   --   --   AMMONIA 21  --   --   --   --   --   --   --   BNP  --   --   --  91.5  --  145.0* 133.1*  --   MG  --   --   --  1.3*  --  2.3 1.8 1.6*  PHOS  --   --   --   --   --  2.9 2.7 1.7*  CALCIUM  8.9  --   --  8.3*  --  8.3* 7.9* 8.4*      Recent Labs  Lab 07/15/23 1613 07/16/23 1519 07/17/23 0621 07/17/23 1016 07/18/23 0637 07/19/23 0634 07/20/23 0439  CRP  --  0.8 0.6  --  0.7 0.9 0.5  PROCALCITON  --   --  0.34  --  0.27 0.17 0.19  LATICACIDVEN 1.7  --   --   --   --   --   --   INR 1.3*  --   --   --   --   --   --   TSH  --   --   --  2.521  --   --   --   AMMONIA 21  --   --   --   --   --   --   BNP  --   --  91.5  --  145.0* 133.1*  --   MG  --   --  1.3*  --  2.3 1.8 1.6*  CALCIUM  8.9  --  8.3*  --  8.3* 7.9* 8.4*    --------------------------------------------------------------------------------------------------------------- Lab Results  Component Value Date   CHOL 129 08/17/2012   HDL 91 (A) 08/17/2012   LDLCALC 24 08/17/2012   TRIG 70 08/17/2012   CHOLHDL 1.9 07/20/2009    Lab Results  Component Value Date   HGBA1C 6.0 (H) 07/20/2021   Recent Labs    07/17/23 1016  TSH 2.521  FREET4 0.83   No results for input(s): "VITAMINB12", "FOLATE", "FERRITIN", "TIBC", "IRON", "RETICCTPCT" in the last 72 hours.  ------------------------------------------------------------------------------------------------------------------ Cardiac Enzymes No results for input(s): "CKMB", "TROPONINI", "MYOGLOBIN" in the last 168 hours.  Invalid input(s): "CK"  Micro Results Recent Results (from the past 240 hours)  Blood culture (routine x 2)     Status: None (Preliminary result)   Collection Time: 07/15/23  4:05 PM   Specimen: BLOOD  Result Value Ref Range Status   Specimen Description   Final    BLOOD LEFT ANTECUBITAL Performed at [facility], [address], [place], [place] [address]    Special Requests   Final    Blood Culture  adequate volume BOTTLES DRAWN AEROBIC AND ANAEROBIC Performed at [facility], [address] [place], [place] [address]    Culture   Final    NO GROWTH 4 DAYS Performed at [facility], [address], [place], [place] [address]    Report Status PENDING  Incomplete  Blood culture (routine x 2)     Status: None (Preliminary result)   Collection Time: 07/15/23  4:10 PM   Specimen: BLOOD  Result Value Ref Range Status   Specimen Description  Final    BLOOD RIGHT ANTECUBITAL Performed at [facility], [address] [place], [place] [address]    Special Requests   Final    Blood Culture adequate volume BOTTLES DRAWN AEROBIC AND ANAEROBIC Performed at [facility], [address], [place], [place] [address]    Culture   Final    NO GROWTH 4 DAYS Performed at [facility], [address], [place], [place] [address]    Report Status PENDING  Incomplete    Radiology Report No results found.    Signature  -   Lynnwood Sauer M.D on 07/20/2023 at 8:06 AM   -  To page go to [email or web address]

## 2023-07-20 NOTE — TOC Progression Note (Signed)
 Transition of Care [facility]) - Progression Note    Patient Details  Name: Marvin Ortega MRN: [id number] Date of Birth: [birth year]  [facility] CM/SW Contact  Eusebio High, RN Phone Number: 07/20/2023, 3:44 PM  Clinical Narrative:     [facility] No DME has been recommended at this time. TOC will continue to follow patient for any additional discharge needs   AVS has been updated          Expected Discharge Plan and Services                                               Social Determinants of Health (SDOH) Interventions SDOH Screenings   Food Insecurity: No Food Insecurity (07/17/2023)  Housing: Low Risk  (07/17/2023)  Transportation Needs: No Transportation Needs (07/17/2023)  Utilities: Not At Risk (07/17/2023)  Social Connections: Patient Declined (07/17/2023)  Tobacco Use: Low Risk  (07/18/2023)    Readmission Risk Interventions     No data to display

## 2023-07-20 NOTE — Plan of Care (Signed)

## 2023-07-20 NOTE — Progress Notes (Signed)
 Reconsult  DOA: 07/15/2023 Hospital Day: 6   Chief Complaint:  Reconsult for drop in hemoglobin  ASSESSMENT    73 y.o. year old with a history not limited to Etoh liver disease ( probable cirrhosis), cholelithiasis, GERD / severe erosive esophagitis, Dieeulafoy lesion, PAF, COPD, BPH , HTN. Admitted 5/8 with FTT, N,V, abnormal colon on CT scan and anemia, blood in stool. Started on Antibiotics out of concern for colitis / abscess. See 5/8 GI consult note  # Blood in stool / acute on chronic anemia  ( this admission) with macrocytosis.   5/9>>Required 1 u RBCs for decline in hgb to 7.6 ( from baseline of around 9). Post transfusion hgb improved to mid 9 range.  Today: Recurrent decline in hgb. This am hgb 6.6.  Orders for transfusion placed. Patient states he had an unformed brown BM this am but did see some blood on tissue. He is a limited historian. Suspect minor bleeding from hemorrhoids.   # Abnormal CT scan with mucosal thickening of the cecum and ascending colon. No abnormalities on colonoscopy this admission but biopsies still pending  # Duodenal ulcer, non-bleeding on EGD this admission. Biopsies pending.   # Presumed cirrhosis ( CT scan), ? Etoh related. No varices EGD this admission. Minimal ascites on CT scan.  MELD 3.0: 16 at 07/17/2023  6:21 AM Normal AFP  # AKI Cr 1.4  # Right inguinal hernia on exam ?    Principal Problem:   Colitis Active Problems:   AKI (acute kidney injury) (HCC)   FTT (failure to thrive) in adult   Hematochezia   Abnormal finding on GI tract imaging   Generalized abdominal pain   Loss of weight   Protein-calorie malnutrition, severe   Hiatal hernia   Duodenal ulcer   Gastritis and gastroduodenitis   Nausea and vomiting   Diverticulosis of colon without hemorrhage   Grade II internal hemorrhoids   PLAN   --Repeating CBC now to check accuracy of hgb prior to RBC transfusion --RBC transfusion orders already given  --Further  evaluation / management of cirrhosis as outpatient.  --Can advance for full liquids --Continue BID PO Pantoprazole .  --Continue Carafate for now ( monitor renal function) --Trial of Anusol cream for hemorrhoids --Need to continue flagyl  and rocephin  ?  No endoscopic evidence of colitis on colonoscopy  --Awaiting gastric / colon biopsies  Subjective   No abdominal pain. Tolerating clears. Describes brown BM with red blood on tissue when wiping.   Objective   GI Studies:  Labs this admission B12 3026 Folate 6.6 Ferritin 168 TIBC 175, iron sat 33%  EGD 07/18/23 Pertinent findings : 5 cm HH, gastritis ( bx),  non -bleeding duodenal ulcers, normal second portion of duodenum  Colonoscopy 07/18/23 Pertinent findings: Fair prep. Hemorrhoids, multiple large and small mouthed diverticula in entire colon. Moderate amount of semi-liquid stool in entire colon - lavaged with adequate visualization. No areas of active bleeding or stigmata of recent bleeding. Biopsies taken throughout.   EGD December 2019 for GI bleeding showed a Dieulafoy lesion in the cardia hiatal hernia and grade D erosive esophagitis, no varices   Recent Labs    07/18/23 0637 07/19/23 0634 07/20/23 0439  WBC 6.3 7.4 4.7  HGB 9.7* 9.3* 6.6*  HCT 28.5* 27.3* 19.8*  MCV 110.0* 112.8* 113.1*  PLT 139* 116* 83*   No results for input(s): "FOLATE", "VITAMINB12", "FERRITIN", "TIBC", "IRONPCTSAT" in the last 72 hours. Recent Labs    07/18/23 [phone]  07/19/23 0634 07/20/23 0439  NA 138 139 135  K 4.1 4.0 3.7  CL 115* 117* 110  CO2 15* 15* 14*  GLUCOSE 87 72 76  BUN 20 17 20   CREATININE 1.38* 1.24 1.42*  CALCIUM  8.3* 7.9* 8.4*   Recent Labs    07/18/23 0637 07/19/23 0634 07/20/23 0439  PROT 5.7* 4.9* 5.0*  ALBUMIN 1.8* 1.7* 3.0*  AST 25 21 17   ALT 9 8 7   ALKPHOS 41 37* 25*  BILITOT 1.4* 0.8 0.9   Imaging:  US  RENAL CLINICAL DATA:  Acute kidney injury  EXAM: RENAL / URINARY TRACT ULTRASOUND  COMPLETE  COMPARISON:  CT abdomen pelvis 07/15/2023  FINDINGS: Right Kidney:  Renal measurements: 9.4 x 5.2 x 4.5 cm = volume: 112 mL. Echogenicity within normal limits. No mass or hydronephrosis visualized.  Left Kidney:  Renal measurements: 10.0 x 4.2 x 4.5 cm = volume: 96 mL. Echogenicity within normal limits. No mass or hydronephrosis visualized. 12 mm calculus seen in the lower pole.  Bladder:  Appears normal for degree of bladder distention.  Other:  Minimal perihepatic and pelvic floor ascites is present. Nodular patent contours consistent with cirrhosis.  IMPRESSION: 1. No acute abnormality of the kidneys. 2. 12 mm nonobstructing left renal calculus.  Electronically Signed   By: Elester Grim M.D.   On: 07/17/2023 11:37     Scheduled inpatient medications:   (feeding supplement) PROSource Plus  30 mL Oral BID AC & HS   sodium chloride    Intravenous Once   sodium chloride    Intravenous Once   atorvastatin   20 mg Oral Daily   cyanocobalamin   1,000 mcg Oral Daily   feeding supplement  1 Container Oral TID BM   finasteride   5 mg Oral q morning   metroNIDAZOLE   500 mg Oral Q12H   midodrine  10 mg Oral TID WC   multivitamin with minerals  1 tablet Oral Daily   pantoprazole   40 mg Oral BID   sucralfate  1 g Oral TID WC & HS   thiamine   100 mg Oral Daily   Continuous inpatient infusions:   cefTRIAXone  (ROCEPHIN )  IV 2 g (07/20/23 0828)   magnesium  sulfate bolus IVPB     potassium PHOSPHATE IVPB (in mmol)     PRN inpatient medications: acetaminophen , albuterol , naLOXone (NARCAN)  injection, ondansetron , oxyCODONE   Vital signs in last 24 hours: Temp:  [97.6 F (36.4 C)-98.3 F (36.8 C)] 98.3 F (36.8 C) (05/12 0808) Pulse Rate:  [63-104] 69 (05/12 0624) Resp:  [12-23] 23 (05/12 0808) BP: (81-116)/(50-79) 84/59 (05/12 0808) SpO2:  [92 %-100 %] 97 % (05/12 0808) Last BM Date : 07/20/23  Intake/Output Summary (Last 24 hours) at 07/20/2023 0848 Last data  filed at 07/19/2023 1400 Gross per 24 hour  Intake 325 ml  Output --  Net 325 ml    Intake/Output from previous day: 05/11 0701 - 05/12 0700 In: 650 [P.O.:650] Out: -  Intake/Output this shift: No intake/output data recorded.   Physical Exam:  General: Alert male in NAD Heart:  Regular rate and rhythm.  Pulmonary: Normal respiratory effort Abdomen: Soft, nondistended, nontender. Normal bowel sounds. Soft bulge in R groin area Extremities: No lower extremity edema  Neurologic: Alert and oriented Psych: Pleasant. Cooperative     LOS: 3 days   Mai Schwalbe ,NP 07/20/2023, 8:48 AM

## 2023-07-20 NOTE — Progress Notes (Signed)
 Physical Therapy Treatment Patient Details Name: Marvin Ortega MRN: [id number] DOB: [birth year] Today's Date: 07/20/2023   History of Present Illness 73 y.o. male presented 5/7 to the ED with complaints of generalized weakness/fatigue.  Failure to thrive and Thickened cecum , p resumed colitis.  PMH: alcoholic liver disease, anemia, paroxysmal A-fib not on anticoagulation due to presumed alcohol use, BPH, COPD, GERD, history of GI bleed, hypertension, prediabetes.    PT Comments  Progressing towards acute functional goals. Able to ambulate 150 feet today with Rw for support. Minor instability in congested areas with turns (eg. bathroom) requiring CGA for safety. No overt LOB or buckling noted. He did require min assist to rise from low toilet, using rail in restroom. CGA from bed with RW to stabilize. Reviewed LE exercises. Would like to see pt at supervision level for safe D/c home with HHPT unless ex wife still willing to assist at home for a while. Patient will continue to benefit from skilled physical therapy services to further improve independence with functional mobility.    If plan is discharge home, recommend the following: A little help with walking and/or transfers;Assistance with cooking/housework;Assist for transportation   Can travel by private vehicle        Equipment Recommendations  None recommended by PT    Recommendations for Other Services       Precautions / Restrictions Precautions Precautions: Fall Recall of Precautions/Restrictions: Impaired Restrictions Weight Bearing Restrictions Per Provider Order: No     Mobility  Bed Mobility Overal bed mobility: Needs Assistance Bed Mobility: Supine to Sit, Sit to Supine     Supine to sit: Supervision Sit to supine: Supervision   General bed mobility comments: Supervision for safety. Extra time. Assisted with multiple lines/leads.    Transfers Overall transfer level: Needs assistance Equipment used: Rolling  walker (2 wheels) Transfers: Sit to/from Stand Sit to Stand: Contact guard assist, Min assist           General transfer comment: CGA for safety from bed, min assist from toilet. Cues for technique.    Ambulation/Gait Ambulation/Gait assistance: Contact guard assist Gait Distance (Feet): 150 Feet Assistive device: Rolling walker (2 wheels) Gait Pattern/deviations: Step-through pattern, Decreased stride length, Drifts right/left Gait velocity: dec Gait velocity interpretation: <1.31 ft/sec, indicative of household ambulator   General Gait Details: Educated on safe AD use with RW for support. Minor instability navigating congested area into/out of bathroom. Stable throughout hallways. CGA for safety. No overt LOB or buckling. Denies dizziness.   Stairs             Wheelchair Mobility     Tilt Bed    Modified Rankin (Stroke Patients Only)       Balance Overall balance assessment: Needs assistance Sitting-balance support: No upper extremity supported, Feet supported Sitting balance-Leahy Scale: Good     Standing balance support: No upper extremity supported Standing balance-Leahy Scale: Fair Standing balance comment: increased sway without UE support.                            Communication Communication Communication: No apparent difficulties  Cognition Arousal: Lethargic, Alert Behavior During Therapy: WFL for tasks assessed/performed   PT - Cognitive impairments: No family/caregiver present to determine baseline, Initiation                       PT - Cognition Comments: Delayed, but following commands. Following commands: Impaired Following commands  impaired: Follows multi-step commands with increased time, Follows multi-step commands inconsistently    Cueing Cueing Techniques: Verbal cues  Exercises General Exercises - Lower Extremity Ankle Circles/Pumps: AROM, Both, 10 reps, Supine Quad Sets: Strengthening, Both, 10 reps,  Supine Gluteal Sets: Strengthening, Both, 10 reps, Supine    General Comments General comments (skin integrity, edema, etc.): BP: supine 98/65, seated 95/63. Had BM, small amount of bright red blood on toilet paper only. RN notified to observe.      Pertinent Vitals/Pain Pain Assessment Pain Assessment: No/denies pain    Home Living                          Prior Function            PT Goals (current goals can now be found in the care plan section) Acute Rehab PT Goals Patient Stated Goal: Get well PT Goal Formulation: With patient Time For Goal Achievement: 07/30/23 Potential to Achieve Goals: Good Progress towards PT goals: Progressing toward goals    Frequency    Min 2X/week      PT Plan      Co-evaluation              AM-PAC PT "6 Clicks" Mobility   Outcome Measure  Help needed turning from your back to your side while in a flat bed without using bedrails?: None Help needed moving from lying on your back to sitting on the side of a flat bed without using bedrails?: A Little Help needed moving to and from a bed to a chair (including a wheelchair)?: A Little Help needed standing up from a chair using your arms (e.g., wheelchair or bedside chair)?: A Little Help needed to walk in hospital room?: A Little Help needed climbing 3-5 steps with a railing? : A Lot 6 Click Score: 18    End of Session Equipment Utilized During Treatment: Gait belt Activity Tolerance: Patient tolerated treatment well Patient left: in bed;with call bell/phone within reach;with bed alarm set Nurse Communication: Mobility status PT Visit Diagnosis: Unsteadiness on feet (R26.81);Other abnormalities of gait and mobility (R26.89);Repeated falls (R29.6);Muscle weakness (generalized) (M62.81);History of falling (Z91.81)     Time: 1610-9604 PT Time Calculation (min) (ACUTE ONLY): 25 min  Charges:    $Gait Training: 8-22 mins $Therapeutic Activity: 8-22 mins PT General  Charges $$ ACUTE PT VISIT: 1 Visit                     Jory Ng, PT, DPT [facility]  Rehabilitation Services Physical Therapist Office: [phone] Website: [email or web address]    Alinda Irani 07/20/2023, 6:12 PM

## 2023-07-20 NOTE — Plan of Care (Signed)

## 2023-07-21 ENCOUNTER — Ambulatory Visit: Payer: Self-pay | Admitting: Gastroenterology

## 2023-07-21 ENCOUNTER — Inpatient Hospital Stay (HOSPITAL_COMMUNITY)

## 2023-07-21 DIAGNOSIS — K2960 Other gastritis without bleeding: Secondary | ICD-10-CM | POA: Diagnosis not present

## 2023-07-21 DIAGNOSIS — D649 Anemia, unspecified: Secondary | ICD-10-CM

## 2023-07-21 DIAGNOSIS — K529 Noninfective gastroenteritis and colitis, unspecified: Secondary | ICD-10-CM | POA: Diagnosis not present

## 2023-07-21 DIAGNOSIS — K269 Duodenal ulcer, unspecified as acute or chronic, without hemorrhage or perforation: Secondary | ICD-10-CM | POA: Diagnosis not present

## 2023-07-21 DIAGNOSIS — R933 Abnormal findings on diagnostic imaging of other parts of digestive tract: Secondary | ICD-10-CM | POA: Diagnosis not present

## 2023-07-21 LAB — COMPREHENSIVE METABOLIC PANEL WITH GFR
ALT: 9 U/L (ref 0–44)
AST: 27 U/L (ref 15–41)
Albumin: 3.7 g/dL (ref 3.5–5.0)
Alkaline Phosphatase: 25 U/L — ABNORMAL LOW (ref 38–126)
Anion gap: 9 (ref 5–15)
BUN: 18 mg/dL (ref 8–23)
CO2: 16 mmol/L — ABNORMAL LOW (ref 22–32)
Calcium: 8.6 mg/dL — ABNORMAL LOW (ref 8.9–10.3)
Chloride: 109 mmol/L (ref 98–111)
Creatinine, Ser: 1.23 mg/dL (ref 0.61–1.24)
GFR, Estimated: >60 mL/min (ref >60)
Glucose, Bld: 96 mg/dL (ref 70–99)
Potassium: 4.4 mmol/L (ref 3.5–5.1)
Sodium: 134 mmol/L — ABNORMAL LOW (ref 135–145)
Total Bilirubin: 2.2 mg/dL — ABNORMAL HIGH (ref 0.0–1.2)
Total Protein: 5.8 g/dL — ABNORMAL LOW (ref 6.5–8.1)

## 2023-07-21 LAB — CBC WITH DIFFERENTIAL/PLATELET
Abs Immature Granulocytes: 0.02 10*3/uL (ref 0.00–0.07)
Basophils Absolute: 0 10*3/uL (ref 0.0–0.1)
Basophils Relative: 1 %
Eosinophils Absolute: 0.1 10*3/uL (ref 0.0–0.5)
Eosinophils Relative: 2 %
HCT: 25.8 % — ABNORMAL LOW (ref 39.0–52.0)
Hemoglobin: 8.9 g/dL — ABNORMAL LOW (ref 13.0–17.0)
Immature Granulocytes: 0 %
Lymphocytes Relative: 14 %
Lymphs Abs: 0.8 10*3/uL (ref 0.7–4.0)
MCH: 36.2 pg — ABNORMAL HIGH (ref 26.0–34.0)
MCHC: 34.5 g/dL (ref 30.0–36.0)
MCV: 104.9 fL — ABNORMAL HIGH (ref 80.0–100.0)
Monocytes Absolute: 0.5 10*3/uL (ref 0.1–1.0)
Monocytes Relative: 10 %
Neutro Abs: 4 10*3/uL (ref 1.7–7.7)
Neutrophils Relative %: 73 %
Platelets: 92 10*3/uL — ABNORMAL LOW (ref 150–400)
RBC: 2.46 MIL/uL — ABNORMAL LOW (ref 4.22–5.81)
WBC: 5.5 10*3/uL (ref 4.0–10.5)
nRBC: 0 % (ref 0.0–0.2)

## 2023-07-21 LAB — TYPE AND SCREEN
ABO/RH(D): B POS
Antibody Screen: NEGATIVE
Unit division: 0
Unit division: 0

## 2023-07-21 LAB — BPAM RBC
Blood Product Expiration Date: 202505172359
Blood Product Expiration Date: 202506052359
ISSUE DATE / TIME: 202505121649
ISSUE DATE / TIME: 202505091210
Unit Type and Rh: 7300
Unit Type and Rh: 7300

## 2023-07-21 LAB — GLUCOSE, CAPILLARY: Glucose-Capillary: 136 mg/dL — ABNORMAL HIGH (ref 70–99)

## 2023-07-21 LAB — BRAIN NATRIURETIC PEPTIDE: B Natriuretic Peptide: 1546.2 pg/mL — ABNORMAL HIGH (ref 0.0–100.0)

## 2023-07-21 LAB — C-REACTIVE PROTEIN: CRP: <0.5 mg/dL (ref <1.0)

## 2023-07-21 LAB — PHOSPHORUS: Phosphorus: 2.4 mg/dL — ABNORMAL LOW (ref 2.5–4.6)

## 2023-07-21 LAB — MAGNESIUM: Magnesium: 2.6 mg/dL — ABNORMAL HIGH (ref 1.7–2.4)

## 2023-07-21 LAB — SURGICAL PATHOLOGY

## 2023-07-21 LAB — PROCALCITONIN: Procalcitonin: 0.2 ng/mL

## 2023-07-21 LAB — TROPONIN I (HIGH SENSITIVITY)
Troponin I (High Sensitivity): 31 ng/L — ABNORMAL HIGH (ref <18)
Troponin I (High Sensitivity): 23 ng/L — ABNORMAL HIGH (ref <18)

## 2023-07-21 MED ORDER — DICLOFENAC SODIUM 1 % EX GEL: 2.0000 g | Freq: Three times a day (TID) | CUTANEOUS | Status: DC | PRN

## 2023-07-21 MED ORDER — K PHOS MONO-SOD PHOS DI & MONO 155-852-130 MG PO TABS
500.0000 mg | ORAL_TABLET | Freq: Two times a day (BID) | ORAL | Status: AC
Start: 2023-07-21 — End: 2023-07-23
  Administered 2023-07-21 – 2023-07-23 (×4): 500 mg via ORAL
  Filled 2023-07-21 (×4): qty 2

## 2023-07-21 MED ORDER — ALUM & MAG HYDROXIDE-SIMETH 200-200-20 MG/5ML PO SUSP
30.0000 mL | Freq: Three times a day (TID) | ORAL | Status: AC
  Administered 2023-07-21 (×3): 30 mL via ORAL
  Filled 2023-07-21 (×3): qty 30

## 2023-07-21 MED ORDER — FUROSEMIDE 20 MG PO TABS
20.0000 mg | ORAL_TABLET | Freq: Once | ORAL | Status: AC
  Administered 2023-07-21: 20 mg via ORAL
  Filled 2023-07-21: qty 1

## 2023-07-21 MED ORDER — THIAMINE MONONITRATE 100 MG PO TABS
100.0000 mg | ORAL_TABLET | Freq: Every day | ORAL | Status: DC
Start: 2023-07-22 — End: 2023-08-01
  Administered 2023-07-22 – 2023-07-23 (×2): 100 mg via ORAL
  Filled 2023-07-21 (×2): qty 1

## 2023-07-21 NOTE — Progress Notes (Signed)
 Pt c/o left side chest pain. BP 94/59 (70). HR 77. Stat EKG done. Troponin ordered. Dr. Michell Ahumada notified.

## 2023-07-21 NOTE — Progress Notes (Addendum)
 Nutrition Follow-up  DOCUMENTATION CODES:   Severe malnutrition in context of chronic illness  INTERVENTION:  Continue Boost Breeze po TID, each supplement provides 250 kcal and 9 grams of protein  Continue 30 ml ProSource Plus BID, each supplement provides 100 kcals and 15 grams protein.   Monitor diet advancement and tolerance Re-attempt calorie count when diet advanced Upon diet advancement, recommend modifying to Ensure Enlive and Magic Cup to provide more nutrition support than current regimen ordered Collect new weight to assess trend  NUTRITION DIAGNOSIS:  Severe Malnutrition related to chronic illness (alcoholic liver disease, COPD, hx GIB, HTN) as evidenced by severe fat depletion, severe muscle depletion, percent weight loss. - remains applicable  GOAL:  Patient will meet greater than or equal to 90% of their needs - progressing  MONITOR:  PO intake, Supplement acceptance, Diet advancement, Weight trends, Skin  REASON FOR ASSESSMENT:  Consult Assessment of nutrition requirement/status, Calorie Count  ASSESSMENT:  Pt with PMH significant for: alcoholic liver disease, anemia, paroxysmal afib, BPH, COPD, GERD, hx of GIB, HTN, prediabetes. Presented to ED with c/o generalized weakness/fatigue, poor PO intake. Imaging suggestive of thickening of cecum. Colitis vs malignancy?  5/8 admitted, CT abd/pelvis: mucosal thickening of cecum and ascending colon 5/9 transfusion 5/10 EGD/colonoscopy: 2 duodenal bulb ulcers, gastritis, 5cm hiatal hernia, diverticulosis and internal hemorrhoids 5/10 advanced to GI soft diet 5/11 advanced to regular texture diet 5/12 melanotic stools, clear liquid diet, transfused  Calorie count impacted by colonoscopy/EGD over the weekend as well as downgrade in diet d/t melanotic stools on 5/12. Has required another transfusion. Colonoscopy unremarkable, however biopsies still pending. EGD showed non-bleeding duodenal ulcers. Biopsies also pending.     Average Meal Intake 5/10: 0% x1 documented meal 5/11: 50% x2 documented meals   Given that he continues on clear liquid diet, it can be ascertained that he is not adequately meeting his needs and calorie count is currently contraindicated due to this barrier. Will re-initiate when diet advanced. Accepting ProSource and Boost Breeze well. Encouraged continued trend.  Admit/ Current Weight: 47kg - needs new weight collected to assess trend, order entered   Needs new weight to assess trend this admission. Order entered. No edema on exam. Bowels active, however melanotic. No new or significant skin integrity issues.   Meds: cyanocobalamin , pantoprazole , metronidazole , MVI, sucralfate Drips: IV ceftriaxone    Labs: Na+ 139>135>134 (L) K+ 4.4 (wdl) PHOS 2.7>1.7>2.4 (L) Mg 1.8>1.6>2.6 (H) Hgb 6.6>8.1>7.4>8.9 (L) CBGs 76-96 x24 hours  Diet Order:   Diet Order             Diet clear liquid Room service appropriate? Yes; Fluid consistency: Thin  Diet effective now            EDUCATION NEEDS:  Education needs have been addressed  Skin:  Skin Assessment: Reviewed RN Assessment  Last BM:  5/12 x1 (bloody)  Height:  Ht Readings from Last 1 Encounters:  07/16/23 5\' 5"  (1.651 m)   Weight:  Wt Readings from Last 1 Encounters:  07/16/23 47 kg   Ideal Body Weight:     BMI:  Body mass index is 17.24 kg/m.  Estimated Nutritional Needs:   Kcal:  1500-1700 kcals  Protein:  75-90g  Fluid:  >1.5L/day  Con Decant MS, RD, LDN Registered Dietitian Clinical Nutrition RD Inpatient Contact Info in Amion

## 2023-07-21 NOTE — Progress Notes (Addendum)
 PROGRESS NOTE                                                                                                                                                                                                             Patient Demographics:    Marvin Ortega, is a 73 y.o. male, DOB - 24-Nov-1950, WGN:[id number]  Outpatient Primary MD for the patient is Entzminger, Iantha Mainland, MD    LOS - 4  Admit date - 07/15/2023    Chief Complaint  Patient presents with   Fatigue       Brief Narrative (HPI from H&P)    73 y.o. male with medical history significant of  alcoholic liver disease, anemia, paroxysmal A-fib not on anticoagulation due to presumed alcohol use, BPH, COPD, GERD, history of GI bleed, hypertension, prediabetes presented to the ED with complaints of generalized weakness/fatigue and poor p.o. intake iso thickened cecum c/f infection vs malignancy on imaging.    Subjective:   Patient in bed, appears comfortable, denies any headache, no fever, some substernal pressure, no shortness of breath , no abdominal pain. No new focal weakness.   Assessment  & Plan :    Pt with fatigue and poor PO intake, as well as low albumin, appears frail and cachectic, so has significant anemia, imaging suggestive of thickening in the cecum colitis versus malignancy. Presentation suspicious for severe protein calorie malnutrition, underwent EGD and colonoscopy on 07/18/2023, EGD showing 2 duodenal bulb ulcers but they were not bleeding, colonoscopy showed diverticulosis with internal hemorrhoids which were likely the source of bleeding, he is s/p 1 unit of packed RBC transfusion on 07/17/2023, question intermittent chronic occult blood loss.  Surprisingly his anemia panel stable, he was doing well unfortunately had melanotic stools again early morning 07/20/2023 with drop in CBC, see with 1 unit of packed RBC on 07/20/2023, CBC currently stable, continue  to monitor another 24 to 48 hours.  GI on board.   #Thickened cecum - #Presumed colitis -  Please review above, GI on board, placed on Rocephin  and Flagyl  in case this is colitis.   #AKI - Cr 1.9 on admission. Baseline 1.0. Presumed pre-renal.  Transfused, hydrated, renal function improving renal ultrasound nonacute.  #Macrocytic anemia -  Stable B12, folate and TSH.   Hypomagnesemia hypophosphatemia.  Replaced.  Severe PCM.  Protein supplement.  Dyslipidemia.  On statin.  Nonspecific substernal pressure.  Likely reflux related, EKG and troponin x 2 stable, blood pressure too low for nitroglycerin trial, trial of Maalox and monitor.       Condition - Extremely Guarded  Family Communication  : Niece Gabriel John [phone]  over the phone on 07/17/2023, message left 07/19/2023 at 9 AM  Code Status :  Full  Consults  :  GI  PUD Prophylaxis : PPI   Procedures  :     EGD & colonoscopy on 07/18/2023.    EGD with 2 duodenal bulb ulcers, both clean-based and without high-grade stigmata of bleeding (biopsied), gastritis (biopsied), large 5 cm hiatal hernia, but no esophageal varices. Colonoscopy with diverticulosis and internal hemorrhoids (likely source of bleeding was hemorrhoidal) without any blood throughout the GI lumen. Normal terminal ileum   CT Head - Non acute  CT Abd Pelvis - Mucosal thickening of the cecum and ascending colon with subtle stranding of the mesenteric fat in the right lower quadrant with normal-appearing cecal appendix could correlate with colitis. *Heterogeneous nodular appearing liver could correlate with hepatocellular disease such as cirrhosis without parenchymal lesions or biliary dilatation. *Multiple gallstones without gallbladder inflammatory changes. *Small amount of fluid surrounding the liver correlates with minimal ascites. *Left nephrolithiasis without hydronephrosis. *Aortic atherosclerosis.      Disposition Plan  :    Status is: Observation   DVT  Prophylaxis  :    SCDs  Lab Results  Component Value Date   PLT 92 (L) 07/21/2023    Diet :  Diet Order             Diet clear liquid Room service appropriate? Yes; Fluid consistency: Thin  Diet effective now                    Inpatient Medications  Scheduled Meds:  (feeding supplement) PROSource Plus  30 mL Oral BID AC & HS   sodium chloride    Intravenous Once   sodium chloride    Intravenous Once   alum & mag hydroxide-simeth  30 mL Oral TID   atorvastatin   20 mg Oral Daily   cyanocobalamin   1,000 mcg Oral Daily   feeding supplement  1 Container Oral TID BM   finasteride   5 mg Oral q morning   hydrocortisone  25 mg Rectal BID   metroNIDAZOLE   500 mg Oral Q12H   midodrine  10 mg Oral TID WC   multivitamin with minerals  1 tablet Oral Daily   pantoprazole   40 mg Oral BID   sucralfate  1 g Oral TID WC & HS   Continuous Infusions:  cefTRIAXone  (ROCEPHIN )  IV 2 g (07/21/23 0846)   PRN Meds:.acetaminophen , albuterol , naLOXone (NARCAN)  injection, ondansetron   Antibiotics  :    Anti-infectives (From admission, onward)    Start     Dose/Rate Route Frequency Ordered Stop   07/16/23 1800  cefTRIAXone  (ROCEPHIN ) 2 g in sodium chloride  0.9 % 100 mL IVPB        2 g 200 mL/hr over 30 Minutes Intravenous Daily 07/16/23 0921     07/16/23 1000  metroNIDAZOLE  (FLAGYL ) tablet 500 mg        500 mg Oral Every 12 hours 07/16/23 0921     07/15/23 2100  metroNIDAZOLE  (FLAGYL ) IVPB 500 mg        500 mg 100 mL/hr over 60 Minutes Intravenous  Once 07/15/23 2059 07/15/23 2231   07/15/23 1730  cefTRIAXone  (ROCEPHIN ) 2 g  in sodium chloride  0.9 % 100 mL IVPB        2 g 200 mL/hr over 30 Minutes Intravenous  Once 07/15/23 1727 07/15/23 1843         Objective:   Vitals:   07/20/23 2321 07/21/23 0400 07/21/23 0800 07/21/23 0918  BP: (!) 94/59 99/66 100/69   Pulse: 76 71 77   Resp: 17 17 16    Temp: 98.9 F (37.2 C) 98.5 F (36.9 C) 98 F (36.7 C)   TempSrc: Oral Oral Oral    SpO2: 98% 95%    Weight:    52.4 kg  Height:        Wt Readings from Last 3 Encounters:  07/21/23 52.4 kg  07/14/23 48.2 kg  07/14/23 48.3 kg     Intake/Output Summary (Last 24 hours) at 07/21/2023 0932 Last data filed at 07/21/2023 0914 Gross per 24 hour  Intake 1843.12 ml  Output 200 ml  Net 1643.12 ml     Physical Exam  Frail cachectic elderly African-American male, awake Alert, No new F.N deficits, Normal affect [place].AT,PERRAL Supple Neck, No JVD,   Symmetrical Chest wall movement, Good air movement bilaterally, CTAB RRR,No Gallops,Rubs or new Murmurs,  +ve B.Sounds, Abd Soft, No tenderness,   No Cyanosis, Clubbing or edema      Data Review:    Recent Labs  Lab 07/15/23 1613 07/16/23 0920 07/18/23 2841 07/19/23 0634 07/20/23 0439 07/20/23 1018 07/20/23 1535 07/21/23 0044  WBC 6.5   < > 6.3 7.4 4.7 4.9 5.0 5.5  HGB 8.0*   < > 9.7* 9.3* 6.6* 8.1* 7.4* 8.9*  HCT 24.0*   < > 28.5* 27.3* 19.8* 25.1* 22.5* 25.8*  PLT 152   < > 139* 116* 83* 114* 89* 92*  MCV 125.7*   < > 110.0* 112.8* 113.1* 116.7* 116.6* 104.9*  MCH 41.9*   < > 37.5* 38.4* 37.7* 37.7* 38.3* 36.2*  MCHC 33.3   < > 34.0 34.1 33.3 32.3 32.9 34.5  RDW 13.2   < > Not Measured Not Measured 22.3* 22.8* 22.3* Not Measured  LYMPHSABS 1.5  --  0.3* 0.4* 1.0  --   --  0.8  MONOABS 0.7  --  0.1 0.1 0.5  --   --  0.5  EOSABS 0.0  --  0.1 0.1 0.1  --   --  0.1  BASOSABS 0.0  --  0.0 0.0 0.0  --   --  0.0   < > = values in this interval not displayed.    Recent Labs  Lab 07/15/23 1613 07/16/23 0920 07/17/23 3244 07/17/23 1016 07/18/23 0637 07/19/23 0634 07/20/23 0439 07/21/23 0044  NA 134*  --  136  --  138 139 135 134*  K 5.2*  --  4.3  --  4.1 4.0 3.7 4.4  CL 107  --  112*  --  115* 117* 110 109  CO2 14*  --  17*  --  15* 15* 14* 16*  ANIONGAP 13  --  7  --  8 7 11 9   GLUCOSE 73  --  71  --  87 72 76 96  BUN 34*  --  22  --  20 17 20 18   CREATININE 1.94*   < > 1.50*  --  1.38* 1.24 1.42*  1.23  AST 26  --  23  --  25 21 17 27   ALT 9  --  10  --  9 8 7 9   ALKPHOS  68  --  42  --  41 37* 25* 25*  BILITOT 0.6  --  0.9  --  1.4* 0.8 0.9 2.2*  ALBUMIN 2.6*  --  1.8*  --  1.8* 1.7* 3.0* 3.7  CRP  --    < > 0.6  --  0.7 0.9 0.5 <0.5  PROCALCITON  --   --  0.34  --  0.27 0.17 0.19 0.20  LATICACIDVEN 1.7  --   --   --   --   --   --   --   INR 1.3*  --   --   --   --   --   --   --   TSH  --   --   --  2.521  --   --   --   --   AMMONIA 21  --   --   --   --   --   --   --   BNP  --   --  91.5  --  145.0* 133.1* 784.4* 1,546.2*  MG  --   --  1.3*  --  2.3 1.8 1.6* 2.6*  PHOS  --   --   --   --  2.9 2.7 1.7* 2.4*  CALCIUM  8.9  --  8.3*  --  8.3* 7.9* 8.4* 8.6*   < > = values in this interval not displayed.      Recent Labs  Lab 07/15/23 1613 07/16/23 1519 07/17/23 0621 07/17/23 1016 07/18/23 0637 07/19/23 0634 07/20/23 0439 07/21/23 0044  CRP  --    < > 0.6  --  0.7 0.9 0.5 <0.5  PROCALCITON  --   --  0.34  --  0.27 0.17 0.19 0.20  LATICACIDVEN 1.7  --   --   --   --   --   --   --   INR 1.3*  --   --   --   --   --   --   --   TSH  --   --   --  2.521  --   --   --   --   AMMONIA 21  --   --   --   --   --   --   --   BNP  --   --  91.5  --  145.0* 133.1* 784.4* 1,546.2*  MG  --   --  1.3*  --  2.3 1.8 1.6* 2.6*  CALCIUM  8.9  --  8.3*  --  8.3* 7.9* 8.4* 8.6*   < > = values in this interval not displayed.    --------------------------------------------------------------------------------------------------------------- Lab Results  Component Value Date   CHOL 129 08/17/2012   HDL 91 (A) 08/17/2012   LDLCALC 24 08/17/2012   TRIG 70 08/17/2012   CHOLHDL 1.9 07/20/2009    Lab Results  Component Value Date   HGBA1C 6.0 (H) 07/20/2021   No results for input(s): "TSH", "T4TOTAL", "FREET4", "T3FREE", "THYROIDAB" in the last 72 hours.  No results for input(s): "VITAMINB12", "FOLATE", "FERRITIN", "TIBC", "IRON", "RETICCTPCT" in the last 72  hours.  ------------------------------------------------------------------------------------------------------------------ Cardiac Enzymes No results for input(s): "CKMB", "TROPONINI", "MYOGLOBIN" in the last 168 hours.  Invalid input(s): "CK"  Micro Results Recent Results (from the past 240 hours)  Blood culture (routine x 2)     Status: None   Collection Time: 07/15/23  4:05 PM   Specimen: BLOOD  Result Value Ref Range Status  Specimen Description   Final    BLOOD LEFT ANTECUBITAL Performed at [facility], [address] [place], [place] [address]    Special Requests   Final    Blood Culture adequate volume BOTTLES DRAWN AEROBIC AND ANAEROBIC Performed at [facility], [address], [place], [place] [address]    Culture   Final    NO GROWTH 5 DAYS Performed at [facility], [address] [place], [place] [address]    Report Status 07/20/2023 FINAL  Final  Blood culture (routine x 2)     Status: None   Collection Time: 07/15/23  4:10 PM   Specimen: BLOOD  Result Value Ref Range Status   Specimen Description   Final    BLOOD RIGHT ANTECUBITAL Performed at [facility], [address] [place], [place] [address]    Special Requests   Final    Blood Culture adequate volume BOTTLES DRAWN AEROBIC AND ANAEROBIC Performed at [facility], [address] [place], [place] [address]    Culture   Final    NO GROWTH 5 DAYS Performed at [facility], [address] [place], [place] [address]    Report Status 07/20/2023 FINAL  Final    Radiology Report DG Chest Port 1 View Result Date: 07/21/2023 CLINICAL DATA:  73 year old male with shortness of breath. EXAM: PORTABLE CHEST 1 VIEW COMPARISON:  Chest radiographs and CT Abdomen and Pelvis 07/15/2023 and earlier. FINDINGS: Portable AP semi upright view at 0628 hours. Lower lung volumes. Tortuous descending thoracic aorta and small  hiatal hernia confirmed on 2022 chest CT, recent CT Abdomen and Pelvis. Stable cardiac size and mediastinal contours. Visualized tracheal air column is within normal limits. Allowing for portable technique the lungs are clear. No pneumothorax or pleural effusion. Negative visible bowel gas. Stable visualized osseous structures. IMPRESSION: Lower lung volumes, no acute cardiopulmonary abnormality. Electronically Signed   By: Marlise Simpers M.D.   On: 07/21/2023 07:08      Signature  -   Lynnwood Sauer M.D on 07/21/2023 at 9:32 AM   -  To page go to [email or web address]

## 2023-07-21 NOTE — Plan of Care (Signed)
 Pt has rested quietly throughout the night with no distress noted. Alert and oriented. ON room air. SR on the monitor. Voids per urinal. Up to BR with assist and walker for BM. Had short episode of chest pain early in night and stat EKG done and troponin. MD aware. No  other complaints voiced.     Problem: Education: Goal: Knowledge of General Education information will improve Description: Including pain rating scale, medication(s)/side effects and non-pharmacologic comfort measures Outcome: Progressing   Problem: Health Behavior/Discharge Planning: Goal: Ability to manage health-related needs will improve Outcome: Progressing   Problem: Clinical Measurements: Goal: Respiratory complications will improve Outcome: Progressing Goal: Cardiovascular complication will be avoided Outcome: Progressing   Problem: Coping: Goal: Level of anxiety will decrease Outcome: Progressing   Problem: Pain Managment: Goal: General experience of comfort will improve and/or be controlled Outcome: Progressing

## 2023-07-21 NOTE — Progress Notes (Signed)
 Daily Progress Note  DOA: 07/15/2023 Hospital Day: 7   Chief Complaint:  declining hgb  ASSESSMENT    73 y.o. year old with a history not limited to Etoh liver disease ( probable cirrhosis), cholelithiasis, GERD / severe erosive esophagitis, Dieulafoy lesion, PAF, COPD, BPH , HTN. Admitted 5/8 with FTT, N,V, abnormal colon on CT scan and anemia, blood in stool. Started on Antibiotics out of concern for colitis / abscess. See 5/8 GI consult note   # GI bleed / anemia / duodenal ulcers / erosive gastritis   Has required 2 u RBCs thus far. Got 2nd one yesterday.  Duodenal and gastric biopsy show healing erosive disease. Negative for H.pylori.  Today: Hgb has been fluctuating. It improved from 7.4 to 8.9 post unit of blood yesterday. He is a limited historian but states he had a brown BM this am but saw a small amount of red blood with wiping.  Suspect minor bleeding from hemorrhoids. He is macrocytic  without evidence for B12 / folate  deficiency.    # Abnormal CT scan with mucosal thickening of the cecum and ascending colon. No abnormalities on colonoscopy this admission and biopsies normal.    # Presumed cirrhosis ( on CT scan) ? Etoh related.  MELD 3.0: 16 at 07/17/2023  6:21 AM Minimal ascites, mild coagulopathy. Normal mentation.  Platelets are low in absence of splenomegaly HCC screening: Normal AFP. No obvious liver lesions on CT Varices screening; none on EGD this admission.    # AKI Cr has normalized   # Right inguinal hernia on exam ?     PLAN   --Further evaluation / management of cirrhosis as outpatient.  --Can advance to solid diet --Continue BID PO Pantoprazole .  --Continue Carafate for total of 10 days -- Continue Anusol cream for hemorrhoids for total of 7 days -- I am going to D/C cipro and flagyl  in absence of colitis    Subjective   Sitting up in chair. No complaints. Tells me he had a brown BM today with some red blood when wiping.   Objective   GI  Studies:   EGD 07/18/23 Pertinent findings : 5 cm HH, gastritis ( bx),  non -bleeding duodenal ulcers, normal second portion of duodenum   Colonoscopy 07/18/23 Pertinent findings: Fair prep. Hemorrhoids, multiple large and small mouthed diverticula in entire colon. Moderate amount of semi-liquid stool in entire colon - lavaged with adequate visualization. No areas of active bleeding or stigmata of recent bleeding. Biopsies taken throughout.   FINAL MICROSCOPIC DIAGNOSIS:   A. DUODENAL ULCER, BIOPSY:  - Healing erosive duodenitis.  - Negative for dysplasia and malignancy.   B. STOMACH, BIOPSY:  - Reactive and healing erosive gastritis.  - Negative for H. pylori, intestinal metaplasia, dysplasia, and  malignancy.   C. COLON, RANDOM, BIOPSY:  - Unremarkable colonic mucosa.  - Negative for microscopic colitis, dysplasia, and malignancy.    EGD December 2019 for GI bleeding showed a Dieulafoy lesion in the cardia hiatal hernia and grade D erosive esophagitis, no varices    Recent Labs    07/20/23 1018 07/20/23 1535 07/21/23 0044  WBC 4.9 5.0 5.5  HGB 8.1* 7.4* 8.9*  HCT 25.1* 22.5* 25.8*  MCV 116.7* 116.6* 104.9*  PLT 114* 89* 92*   Recent Labs    07/19/23 0634 07/20/23 0439 07/21/23 0044  NA 139 135 134*  K 4.0 3.7 4.4  CL 117* 110 109  CO2 15* 14* 16*  GLUCOSE 72  76 96  BUN 17 20 18   CREATININE 1.24 1.42* 1.23  CALCIUM  7.9* 8.4* 8.6*   Recent Labs    07/19/23 0634 07/20/23 0439 07/21/23 0044  PROT 4.9* 5.0* 5.8*  ALBUMIN 1.7* 3.0* 3.7  AST 21 17 27   ALT 8 7 9   ALKPHOS 37* 25* 25*  BILITOT 0.8 0.9 2.2*   Imaging:  DG Chest Port 1 View CLINICAL DATA:  73 year old male with shortness of breath.  EXAM: PORTABLE CHEST 1 VIEW  COMPARISON:  Chest radiographs and CT Abdomen and Pelvis 07/15/2023 and earlier.  FINDINGS: Portable AP semi upright view at 0628 hours. Lower lung volumes. Tortuous descending thoracic aorta and small hiatal hernia  confirmed on 2022 chest CT, recent CT Abdomen and Pelvis. Stable cardiac size and mediastinal contours. Visualized tracheal air column is within normal limits. Allowing for portable technique the lungs are clear. No pneumothorax or pleural effusion. Negative visible bowel gas. Stable visualized osseous structures.  IMPRESSION: Lower lung volumes, no acute cardiopulmonary abnormality.  Electronically Signed   By: Marlise Simpers M.D.   On: 07/21/2023 07:08     Scheduled inpatient medications:   (feeding supplement) PROSource Plus  30 mL Oral BID AC & HS   sodium chloride    Intravenous Once   sodium chloride    Intravenous Once   alum & mag hydroxide-simeth  30 mL Oral TID   atorvastatin   20 mg Oral Daily   cyanocobalamin   1,000 mcg Oral Daily   feeding supplement  1 Container Oral TID BM   finasteride   5 mg Oral q morning   hydrocortisone  25 mg Rectal BID   metroNIDAZOLE   500 mg Oral Q12H   midodrine  10 mg Oral TID WC   multivitamin with minerals  1 tablet Oral Daily   pantoprazole   40 mg Oral BID   sucralfate  1 g Oral TID WC & HS   Continuous inpatient infusions:   cefTRIAXone  (ROCEPHIN )  IV 2 g (07/21/23 0846)   PRN inpatient medications: acetaminophen , albuterol , diclofenac Sodium, naLOXone (NARCAN)  injection, ondansetron   Vital signs in last 24 hours: Temp:  [98 F (36.7 C)-98.9 F (37.2 C)] 98 F (36.7 C) (05/13 0800) Pulse Rate:  [69-77] 77 (05/13 0800) Resp:  [15-18] 16 (05/13 0800) BP: (90-103)/(58-69) 100/69 (05/13 0800) SpO2:  [95 %-100 %] 95 % (05/13 0400) Weight:  [52.4 kg] 52.4 kg (05/13 0918) Last BM Date : 07/20/23  Intake/Output Summary (Last 24 hours) at 07/21/2023 1241 Last data filed at 07/21/2023 0914 Gross per 24 hour  Intake 1843.12 ml  Output 200 ml  Net 1643.12 ml    Intake/Output from previous day: 05/12 0701 - 05/13 0700 In: 1603.1 [P.O.:360; Blood:340; IV Piggyback:903.1] Out: 200 [Urine:200] Intake/Output this shift: Total I/O In: 240  [P.O.:240] Out: -    Physical Exam:  General: Alert male in NAD Heart:  Regular rate and rhythm.  Pulmonary: Normal respiratory effort Abdomen: Soft, nondistended, nontender. Normal bowel sounds. Neurologic: Alert and oriented Psych: Pleasant. Cooperative     LOS: 4 days   Mai Schwalbe ,NP 07/21/2023, 12:41 PM

## 2023-07-21 NOTE — Progress Notes (Addendum)
   07/21/23 1002  Mobility  Activity Ambulated with assistance in hallway  Level of Assistance Standby assist, set-up cues, supervision of patient - no hands on  Assistive Device Front wheel walker  Distance Ambulated (ft) 150 ft  Activity Response Tolerated fair  Mobility Referral Yes  Mobility visit 1 Mobility  Mobility Specialist Start Time (ACUTE ONLY) 1002  Mobility Specialist Stop Time (ACUTE ONLY) 1030  Mobility Specialist Time Calculation (min) (ACUTE ONLY) 28 min   Mobility Specialist: Progress Note-Visits:2  Pre-Mobility:      HR 83 During Mobility: HR 126, SpO2 98% RA Post-Mobility:    HR 87  Pt agreeable to mobility session - received in bed. C/o his feet "hurt.". Returned to chair with all needs met - call bell within reach. Chair alarm on.  -----------------------------------------------------------  Pre-Mobility:      HR 84 During Mobility: HR 116 Post-Mobility:    HR 93  Pt agreeable to mobility session - received in bed. Pt stated his stomach feels better after he ate a grilled cheese sandwich. Returned to bed with all needs met - call bell within reach.   [name], BS Mobility Specialist Please contact via SecureChat or  Rehab office at [phone].

## 2023-07-22 DIAGNOSIS — D649 Anemia, unspecified: Secondary | ICD-10-CM | POA: Diagnosis not present

## 2023-07-22 LAB — CBC WITH DIFFERENTIAL/PLATELET
Abs Immature Granulocytes: 0.03 10*3/uL (ref 0.00–0.07)
Basophils Absolute: 0.1 10*3/uL (ref 0.0–0.1)
Basophils Relative: 1 %
Eosinophils Absolute: 0.1 10*3/uL (ref 0.0–0.5)
Eosinophils Relative: 2 %
HCT: 24.2 % — ABNORMAL LOW (ref 39.0–52.0)
Hemoglobin: 8.5 g/dL — ABNORMAL LOW (ref 13.0–17.0)
Immature Granulocytes: 1 %
Lymphocytes Relative: 17 %
Lymphs Abs: 1.1 10*3/uL (ref 0.7–4.0)
MCH: 36 pg — ABNORMAL HIGH (ref 26.0–34.0)
MCHC: 35.1 g/dL (ref 30.0–36.0)
MCV: 102.5 fL — ABNORMAL HIGH (ref 80.0–100.0)
Monocytes Absolute: 0.4 10*3/uL (ref 0.1–1.0)
Monocytes Relative: 6 %
Neutro Abs: 4.7 10*3/uL (ref 1.7–7.7)
Neutrophils Relative %: 73 %
Platelets: 92 10*3/uL — ABNORMAL LOW (ref 150–400)
RBC: 2.36 MIL/uL — ABNORMAL LOW (ref 4.22–5.81)
WBC: 6.4 10*3/uL (ref 4.0–10.5)
nRBC: 0 % (ref 0.0–0.2)

## 2023-07-22 LAB — COMPREHENSIVE METABOLIC PANEL WITH GFR
ALT: 8 U/L (ref 0–44)
AST: 21 U/L (ref 15–41)
Albumin: 2.7 g/dL — ABNORMAL LOW (ref 3.5–5.0)
Alkaline Phosphatase: 26 U/L — ABNORMAL LOW (ref 38–126)
Anion gap: 5 (ref 5–15)
BUN: 17 mg/dL (ref 8–23)
CO2: 16 mmol/L — ABNORMAL LOW (ref 22–32)
Calcium: 8.2 mg/dL — ABNORMAL LOW (ref 8.9–10.3)
Chloride: 115 mmol/L — ABNORMAL HIGH (ref 98–111)
Creatinine, Ser: 1.21 mg/dL (ref 0.61–1.24)
GFR, Estimated: >60 mL/min (ref >60)
Glucose, Bld: 89 mg/dL (ref 70–99)
Potassium: 3.5 mmol/L (ref 3.5–5.1)
Sodium: 136 mmol/L (ref 135–145)
Total Bilirubin: 0.9 mg/dL (ref 0.0–1.2)
Total Protein: 5.1 g/dL — ABNORMAL LOW (ref 6.5–8.1)

## 2023-07-22 LAB — PROCALCITONIN: Procalcitonin: 0.15 ng/mL

## 2023-07-22 LAB — GLUCOSE, CAPILLARY
Glucose-Capillary: 109 mg/dL — ABNORMAL HIGH (ref 70–99)
Glucose-Capillary: 91 mg/dL (ref 70–99)

## 2023-07-22 LAB — MAGNESIUM: Magnesium: 2.2 mg/dL (ref 1.7–2.4)

## 2023-07-22 MED ORDER — ENSURE ENLIVE PO LIQD
237.0000 mL | Freq: Two times a day (BID) | ORAL | Status: DC
  Administered 2023-07-22 – 2023-07-23 (×2): 237 mL via ORAL

## 2023-07-22 NOTE — Progress Notes (Signed)
 PROGRESS NOTE                                                                                                                                                                                                             Patient Demographics:    Marvin Ortega, is a 73 y.o. male, DOB - Nov 30, 1950, ZOX:[id number]  Outpatient Primary MD for the patient is Entzminger, Iantha Mainland, MD    LOS - 5  Admit date - 07/15/2023    Chief Complaint  Patient presents with   Fatigue       Brief Narrative (HPI from H&P)     73 y.o. male with medical history significant of  alcoholic liver disease, anemia, paroxysmal A-fib not on anticoagulation due to presumed alcohol use, BPH, COPD, GERD, history of GI bleed, hypertension, prediabetes presented to the ED with complaints of generalized weakness/fatigue and poor p.o. intake iso thickened cecum c/f infection vs malignancy on imaging.    Subjective:   Patient in bed, no chest pain, no shortness of breath   Assessment  & Plan :   GI bleed anemia Duodenal ulcer/erosive gastritis/internal hemorrhoids -Pt with fatigue and poor PO intake, as well as low albumin, appears frail and cachectic, so has significant anemia, imaging suggestive of thickening in the cecum colitis versus malignancy. -Presentation suspicious for severe protein calorie malnutrition, underwent EGD and colonoscopy on 07/18/2023, EGD showing 2 duodenal bulb ulcers, colonoscopy showed diverticulosis with internal hemorrhoids  - he is s/p 1 unit of packed RBC transfusion on 07/17/2023, question intermittent chronic occult blood loss.  Surprisingly his anemia panel stable, he was doing well unfortunately  Recommendations per GI --Continue BID PO Pantoprazole .  --Continue Carafate for total of 10 days -- Continue Anusol cream for hemorrhoids for total of 7 days  Thickened cecum /Presumed colitis -  Please review above, GI on board,  placed on Rocephin  and Flagyl  in case this is colitis.  Antibiotic has been stopped per GI recommendations  -Abnormal CT scan with mucosal thickening of the cecum and ascending colon. No abnormalities on colonoscopy this admission and biopsies normal.    AKI - Cr 1.9 on admission. Baseline 1.0. Presumed pre-renal.  Transfused, hydrated, renal function improving renal ultrasound nonacute.  Macrocytic anemia -  Stable B12, folate and TSH.   Hypomagnesemia hypophosphatemia.  Replaced.  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 Normal mentation.  Platelets are low in absence of splenomegaly HCC screening: Normal AFP. No obvious liver lesions on CT Varices screening; none on EGD this admission.    Severe PCM.  Protein supplement.  Dyslipidemia.  On statin.  Nonspecific substernal pressure.  Likely reflux related, EKG and troponin x 2 stable, blood pressure too low for nitroglycerin trial, trial of Maalox and monitor.       Condition - Extremely Guarded  Family Communication  : None at bedside  Code Status :  Full  Consults  :  GI  PUD Prophylaxis : PPI   Procedures  :     EGD & colonoscopy on 07/18/2023.    EGD with 2 duodenal bulb ulcers, both clean-based and without high-grade stigmata of bleeding (biopsied), gastritis (biopsied), large 5 cm hiatal hernia, but no esophageal varices. Colonoscopy with diverticulosis and internal hemorrhoids (likely source of bleeding was hemorrhoidal) without any blood throughout the GI lumen. Normal terminal ileum   CT Head - Non acute  CT Abd Pelvis - Mucosal thickening of the cecum and ascending colon with subtle stranding of the mesenteric fat in the right lower quadrant with normal-appearing cecal appendix could correlate with colitis. *Heterogeneous nodular appearing liver could correlate with hepatocellular disease such as cirrhosis without parenchymal lesions or biliary  dilatation. *Multiple gallstones without gallbladder inflammatory changes. *Small amount of fluid surrounding the liver correlates with minimal ascites. *Left nephrolithiasis without hydronephrosis. *Aortic atherosclerosis.      Disposition Plan  :     DVT Prophylaxis  :    SCDs  Lab Results  Component Value Date   PLT 92 (L) 07/22/2023    Diet :  Diet Order             Diet Heart Room service appropriate? Yes; Fluid consistency: Thin  Diet effective now                    Inpatient Medications  Scheduled Meds:  (feeding supplement) PROSource Plus  30 mL Oral BID AC & HS   sodium chloride    Intravenous Once   sodium chloride    Intravenous Once   atorvastatin   20 mg Oral Daily   cyanocobalamin   1,000 mcg Oral Daily   feeding supplement  237 mL Oral BID BM   finasteride   5 mg Oral q morning   midodrine  10 mg Oral TID WC   multivitamin with minerals  1 tablet Oral Daily   pantoprazole   40 mg Oral BID   phosphorus  500 mg Oral BID   sucralfate  1 g Oral TID WC & HS   thiamine   100 mg Oral Daily   Continuous Infusions:   PRN Meds:.acetaminophen , albuterol , diclofenac Sodium, naLOXone (NARCAN)  injection, ondansetron   Antibiotics  :    Anti-infectives (From admission, onward)    Start     Dose/Rate Route Frequency Ordered Stop   07/16/23 1800  cefTRIAXone  (ROCEPHIN ) 2 g in sodium chloride  0.9 % 100 mL IVPB  Status:  Discontinued        2 g 200 mL/hr over 30 Minutes Intravenous Daily 07/16/23 0921 07/21/23 1302   07/16/23 1000  metroNIDAZOLE  (FLAGYL ) tablet 500 mg  Status:  Discontinued        500 mg Oral Every 12 hours 07/16/23 0921 07/21/23 1302   07/15/23 2100  metroNIDAZOLE  (FLAGYL ) IVPB 500 mg        500 mg  100 mL/hr over 60 Minutes Intravenous  Once 07/15/23 2059 07/15/23 2231   07/15/23 1730  cefTRIAXone  (ROCEPHIN ) 2 g in sodium chloride  0.9 % 100 mL IVPB        2 g 200 mL/hr over 30 Minutes Intravenous  Once 07/15/23 1727 07/15/23 1843          Objective:   Vitals:   07/22/23 0400 07/22/23 0423 07/22/23 0731 07/22/23 1140  BP:   96/67 104/66  Pulse:   95 94  Resp: 18  18 16   Temp:  98.5 F (36.9 C) 98.2 F (36.8 C) 98 F (36.7 C)  TempSrc:  Oral Oral Oral  SpO2:      Weight:      Height:        Wt Readings from Last 3 Encounters:  07/21/23 52.4 kg  07/14/23 48.2 kg  07/14/23 48.3 kg     Intake/Output Summary (Last 24 hours) at 07/22/2023 1307 Last data filed at 07/22/2023 0540 Gross per 24 hour  Intake 1080 ml  Output 200 ml  Net 880 ml     Physical Exam  Awake Alert, Oriented X 3, frail, deconditioned, chronically ill-appearing Symmetrical Chest wall movement, Good air movement bilaterally, CTAB RRR,No Gallops,Rubs or new Murmurs, No Parasternal Heave +ve B.Sounds, Abd Soft, No tenderness, No rebound - guarding or rigidity. No Cyanosis, Clubbing or edema, No new Rash or bruise        Data Review:    Recent Labs  Lab 07/18/23 0637 07/19/23 0634 07/20/23 0439 07/20/23 1018 07/20/23 1535 07/21/23 0044 07/22/23 0433  WBC 6.3 7.4 4.7 4.9 5.0 5.5 6.4  HGB 9.7* 9.3* 6.6* 8.1* 7.4* 8.9* 8.5*  HCT 28.5* 27.3* 19.8* 25.1* 22.5* 25.8* 24.2*  PLT 139* 116* 83* 114* 89* 92* 92*  MCV 110.0* 112.8* 113.1* 116.7* 116.6* 104.9* 102.5*  MCH 37.5* 38.4* 37.7* 37.7* 38.3* 36.2* 36.0*  MCHC 34.0 34.1 33.3 32.3 32.9 34.5 35.1  RDW Not Measured Not Measured 22.3* 22.8* 22.3* Not Measured Not Measured  LYMPHSABS 0.3* 0.4* 1.0  --   --  0.8 PENDING  MONOABS 0.1 0.1 0.5  --   --  0.5 PENDING  EOSABS 0.1 0.1 0.1  --   --  0.1 PENDING  BASOSABS 0.0 0.0 0.0  --   --  0.0 PENDING    Recent Labs  Lab 07/15/23 1613 07/16/23 0920 07/17/23 0621 07/17/23 1016 07/18/23 0637 07/19/23 0634 07/20/23 0439 07/21/23 0044 07/22/23 0433  NA 134*  --  136  --  138 139 135 134* 136  K 5.2*  --  4.3  --  4.1 4.0 3.7 4.4 3.5  CL 107  --  112*  --  115* 117* 110 109 115*  CO2 14*  --  17*  --  15* 15* 14* 16* 16*  ANIONGAP  13  --  7  --  8 7 11 9 5   GLUCOSE 73  --  71  --  87 72 76 96 89  BUN 34*  --  22  --  20 17 20 18 17   CREATININE 1.94*   < > 1.50*  --  1.38* 1.24 1.42* 1.23 1.21  AST 26  --  23  --  25 21 17 27 21   ALT 9  --  10  --  9 8 7 9 8   ALKPHOS 68  --  42  --  41 37* 25* 25* 26*  BILITOT 0.6  --  0.9  --  1.4* 0.8 0.9 2.2* 0.9  ALBUMIN 2.6*  --  1.8*  --  1.8* 1.7* 3.0* 3.7 2.7*  CRP  --    < > 0.6  --  0.7 0.9 0.5 <0.5  --   PROCALCITON  --    < > 0.34  --  0.27 0.17 0.19 0.20 0.15  LATICACIDVEN 1.7  --   --   --   --   --   --   --   --   INR 1.3*  --   --   --   --   --   --   --   --   TSH  --   --   --  2.521  --   --   --   --   --   AMMONIA 21  --   --   --   --   --   --   --   --   BNP  --   --  91.5  --  145.0* 133.1* 784.4* 1,546.2*  --   MG  --    < > 1.3*  --  2.3 1.8 1.6* 2.6* 2.2  PHOS  --   --   --   --  2.9 2.7 1.7* 2.4*  --   CALCIUM  8.9  --  8.3*  --  8.3* 7.9* 8.4* 8.6* 8.2*   < > = values in this interval not displayed.      Recent Labs  Lab 07/15/23 1613 07/16/23 1519 07/17/23 0621 07/17/23 1016 07/18/23 1610 07/19/23 0634 07/20/23 0439 07/21/23 0044 07/22/23 0433  CRP  --    < > 0.6  --  0.7 0.9 0.5 <0.5  --   PROCALCITON  --    < > 0.34  --  0.27 0.17 0.19 0.20 0.15  LATICACIDVEN 1.7  --   --   --   --   --   --   --   --   INR 1.3*  --   --   --   --   --   --   --   --   TSH  --   --   --  2.521  --   --   --   --   --   AMMONIA 21  --   --   --   --   --   --   --   --   BNP  --   --  91.5  --  145.0* 133.1* 784.4* 1,546.2*  --   MG  --    < > 1.3*  --  2.3 1.8 1.6* 2.6* 2.2  CALCIUM  8.9  --  8.3*  --  8.3* 7.9* 8.4* 8.6* 8.2*   < > = values in this interval not displayed.    --------------------------------------------------------------------------------------------------------------- Lab Results  Component Value Date   CHOL 129 08/17/2012   HDL 91 (A) 08/17/2012   LDLCALC 24 08/17/2012   TRIG 70 08/17/2012   CHOLHDL 1.9 07/20/2009    Lab  Results  Component Value Date   HGBA1C 6.0 (H) 07/20/2021   No results for input(s): "TSH", "T4TOTAL", "FREET4", "T3FREE", "THYROIDAB" in the last 72 hours.  No results for input(s): "VITAMINB12", "FOLATE", "FERRITIN", "TIBC", "IRON", "RETICCTPCT" in the last 72 hours.  ------------------------------------------------------------------------------------------------------------------ Cardiac Enzymes No results for input(s): "CKMB", "TROPONINI", "MYOGLOBIN" in the last 168 hours.  Invalid input(s): "CK"  Micro Results Recent Results (from the past 240 hours)  Blood culture (routine x 2)     Status: None   Collection Time: 07/15/23  4:05 PM   Specimen: BLOOD  Result Value Ref Range Status   Specimen Description   Final    BLOOD LEFT ANTECUBITAL Performed at [facility], [address], [place], [place] [address]    Special Requests   Final    Blood Culture adequate volume BOTTLES DRAWN AEROBIC AND ANAEROBIC Performed at [facility], [address] [place], [place] [address]    Culture   Final    NO GROWTH 5 DAYS Performed at [facility], [address] [place], [place] [address]    Report Status 07/20/2023 FINAL  Final  Blood culture (routine x 2)     Status: None   Collection Time: 07/15/23  4:10 PM   Specimen: BLOOD  Result Value Ref Range Status   Specimen Description   Final    BLOOD RIGHT ANTECUBITAL Performed at [facility], [address] [place], [place] [address]    Special Requests   Final    Blood Culture adequate volume BOTTLES DRAWN AEROBIC AND ANAEROBIC Performed at [facility], [address], [place], [place] [address]    Culture   Final    NO GROWTH 5 DAYS Performed at [facility], [address] [place], [place] [address]    Report Status 07/20/2023 FINAL  Final    Radiology Report DG Chest Port 1 View Result Date: 07/21/2023 CLINICAL DATA:   73 year old male with shortness of breath. EXAM: PORTABLE CHEST 1 VIEW COMPARISON:  Chest radiographs and CT Abdomen and Pelvis 07/15/2023 and earlier. FINDINGS: Portable AP semi upright view at 0628 hours. Lower lung volumes. Tortuous descending thoracic aorta and small hiatal hernia confirmed on 2022 chest CT, recent CT Abdomen and Pelvis. Stable cardiac size and mediastinal contours. Visualized tracheal air column is within normal limits. Allowing for portable technique the lungs are clear. No pneumothorax or pleural effusion. Negative visible bowel gas. Stable visualized osseous structures. IMPRESSION: Lower lung volumes, no acute cardiopulmonary abnormality. Electronically Signed   By: Marlise Simpers M.D.   On: 07/21/2023 07:08      Signature  -   Kelleen Patee Donya Tomaro M.D on 07/22/2023 at 1:07 PM   -  To page go to [email or web address]

## 2023-07-22 NOTE — Plan of Care (Signed)
 Pt has rested quietly throughout the night with no distress noted. Alert and oriented. On room air. SR on the monitor. Voids per urinal. Up to BR for BM with walker and standby assist. No complaints voiced.     Problem: Education: Goal: Knowledge of General Education information will improve Description: Including pain rating scale, medication(s)/side effects and non-pharmacologic comfort measures Outcome: Progressing   Problem: Health Behavior/Discharge Planning: Goal: Ability to manage health-related needs will improve Outcome: Progressing   Problem: Clinical Measurements: Goal: Respiratory complications will improve Outcome: Progressing Goal: Cardiovascular complication will be avoided Outcome: Progressing   Problem: Pain Managment: Goal: General experience of comfort will improve and/or be controlled Outcome: Progressing

## 2023-07-22 NOTE — Progress Notes (Signed)
 Nutrition Brief Note  Patient advanced to heart healthy, regular texture diet yesterday. Consumed 100% of his dinner meal with tolerance. Will adjust supplements to more appropriate option now that he is not limited by clear liquid designation. He is accepting  both Prosource and Boost Breeze. Will modify Boost Breeze TID to Ensure Enlive BID to avoid supplement overlap. Will also add Magic Cup to meal trays.  INTERVENTION:  Discontinue Boost Breeze po TID, each supplement provides 250 kcal and 9 grams of protein  Continue 30 ml ProSource Plus BID before breakfast and at bedtime, each supplement provides 100 kcals and 15 grams protein.   Add Ensure Enlive po BID, each supplement provides 350 kcal and 20 grams of protein.  Add Magic cup TID with meals, each supplement provides 290 kcal and 9 grams of protein  Assess documented intake and re-attempt calorie count if documentation inadequate  Wt Readings from Last 15 Encounters:  07/21/23 52.4 kg  07/14/23 48.2 kg  07/14/23 48.3 kg  04/09/23 53.1 kg  01/22/23 63.5 kg  08/08/21 65.3 kg  07/24/21 71.2 kg  01/07/21 72.6 kg  11/07/20 72.3 kg  09/14/20 71 kg  06/02/20 72.6 kg  12/14/19 71 kg  06/25/19 66.7 kg  06/08/19 66.7 kg  05/24/19 66.6 kg   NUTRITION DIAGNOSIS:  Severe Malnutrition related to chronic illness (alcoholic liver disease, COPD, hx GIB, HTN) as evidenced by severe fat depletion, severe muscle depletion, percent weight loss. - remains applicable   GOAL:  Patient will meet greater than or equal to 90% of their needs - progressing   MONITOR:  PO intake, Supplement acceptance, Diet advancement, Weight trends, Skin    Con Decant MS, RD, LDN Registered Dietitian Clinical Nutrition RD Inpatient Contact Info in Amion

## 2023-07-22 NOTE — Progress Notes (Signed)
 Physical Therapy Treatment Patient Details Name: Marvin Ortega MRN: [id number] DOB: 07/29/1950 Today's Date: 07/22/2023   History of Present Illness 73 y.o. male presented 5/7 to the ED with complaints of generalized weakness/fatigue.  Failure to thrive and Thickened cecum , p resumed colitis.  PMH: alcoholic liver disease, anemia, paroxysmal A-fib not on anticoagulation due to presumed alcohol use, BPH, COPD, GERD, history of GI bleed, hypertension, prediabetes.    PT Comments  Good progress, functioning grossly at a supervision level with mobility today. Using RW for support, minor challenge in congested areas but able to self correct. Fatigued at 150' distance. Declined to sit in recliner. Encouraged OOB for 3 meals/day at minimum. BP 93/61 to 103/71. HR 122 with gait. 94 at rest. during session.Red tinge in toilet after BM attempt (mostly gas,) RN notified, preserved for visual.     If plan is discharge home, recommend the following: A little help with walking and/or transfers;Assistance with cooking/housework;Assist for transportation   Can travel by private vehicle        Equipment Recommendations  None recommended by PT    Recommendations for Other Services       Precautions / Restrictions Precautions Precautions: Fall Recall of Precautions/Restrictions: Impaired Restrictions Weight Bearing Restrictions Per Provider Order: No     Mobility  Bed Mobility Overal bed mobility: Needs Assistance Bed Mobility: Supine to Sit, Sit to Supine     Supine to sit: Modified independent (Device/Increase time), HOB elevated Sit to supine: Modified independent (Device/Increase time)   General bed mobility comments: No assist needed, slower with some effortb.    Transfers Overall transfer level: Needs assistance Equipment used: Rolling walker (2 wheels) Transfers: Sit to/from Stand Sit to Stand: Supervision           General transfer comment: Supervision for safety from bed  and toilet today with rail use in bathroom. No physical assist. Stable with RW for support.    Ambulation/Gait Ambulation/Gait assistance: Supervision Gait Distance (Feet): 150 Feet Assistive device: Rolling walker (2 wheels) Gait Pattern/deviations: Step-through pattern, Decreased stride length, Drifts right/left Gait velocity: dec Gait velocity interpretation: <1.8 ft/sec, indicate of risk for recurrent falls   General Gait Details: Supervision for safety. Minor instability in congested areas but able to self correct, RW for support. HR to 122 with gait, settles quickly to baseline upon sitting. Declined further distance due to fatigue.   Stairs             Wheelchair Mobility     Tilt Bed    Modified Rankin (Stroke Patients Only)       Balance Overall balance assessment: Needs assistance Sitting-balance support: No upper extremity supported, Feet supported Sitting balance-Leahy Scale: Good     Standing balance support: No upper extremity supported Standing balance-Leahy Scale: Fair Standing balance comment: increased sway without UE support.                            Communication Communication Communication: No apparent difficulties  Cognition Arousal: Alert Behavior During Therapy: WFL for tasks assessed/performed   PT - Cognitive impairments: No family/caregiver present to determine baseline                         Following commands: Impaired Following commands impaired: Follows multi-step commands with increased time, Follows multi-step commands inconsistently    Cueing Cueing Techniques: Verbal cues  Exercises      General  Comments General comments (skin integrity, edema, etc.): BP: Supine 93/61 HR 94. Able to use toilet and wipe independently. BP sitting after activity 103/71 HR 103. Some blood mied in toilet after BM attempt (mostly gas), RN notified and left for her to view.      Pertinent Vitals/Pain Pain  Assessment Pain Assessment: No/denies pain    Home Living                          Prior Function            PT Goals (current goals can now be found in the care plan section) Acute Rehab PT Goals Patient Stated Goal: Get well PT Goal Formulation: With patient Time For Goal Achievement: 07/30/23 Potential to Achieve Goals: Good Progress towards PT goals: Progressing toward goals    Frequency    Min 2X/week      PT Plan      Co-evaluation              AM-PAC PT "6 Clicks" Mobility   Outcome Measure  Help needed turning from your back to your side while in a flat bed without using bedrails?: None Help needed moving from lying on your back to sitting on the side of a flat bed without using bedrails?: A Little Help needed moving to and from a bed to a chair (including a wheelchair)?: A Little Help needed standing up from a chair using your arms (e.g., wheelchair or bedside chair)?: A Little Help needed to walk in hospital room?: A Little Help needed climbing 3-5 steps with a railing? : A Lot 6 Click Score: 18    End of Session Equipment Utilized During Treatment: Gait belt Activity Tolerance: Patient tolerated treatment well Patient left: in bed;with call bell/phone within reach;with bed alarm set (Declined to sit in recliner.)   PT Visit Diagnosis: Unsteadiness on feet (R26.81);Other abnormalities of gait and mobility (R26.89);Repeated falls (R29.6);Muscle weakness (generalized) (M62.81);History of falling (Z91.81)     Time: 7846-9629 PT Time Calculation (min) (ACUTE ONLY): 19 min  Charges:    $Gait Training: 8-22 mins PT General Charges $$ ACUTE PT VISIT: 1 Visit                     Jory Ng, PT, DPT [facility]  Rehabilitation Services Physical Therapist Office: [phone] Website: [email or web address]    Alinda Irani 07/22/2023, 10:55 AM

## 2023-07-23 ENCOUNTER — Other Ambulatory Visit (HOSPITAL_COMMUNITY): Payer: Self-pay

## 2023-07-23 LAB — CBC
HCT: 23.3 % — ABNORMAL LOW (ref 39.0–52.0)
Hemoglobin: 8.2 g/dL — ABNORMAL LOW (ref 13.0–17.0)
MCH: 36.3 pg — ABNORMAL HIGH (ref 26.0–34.0)
MCHC: 35.2 g/dL (ref 30.0–36.0)
MCV: 103.1 fL — ABNORMAL HIGH (ref 80.0–100.0)
Platelets: 92 10*3/uL — ABNORMAL LOW (ref 150–400)
RBC: 2.26 MIL/uL — ABNORMAL LOW (ref 4.22–5.81)
WBC: 6.7 10*3/uL (ref 4.0–10.5)
nRBC: 0 % (ref 0.0–0.2)

## 2023-07-23 LAB — BASIC METABOLIC PANEL WITH GFR
Anion gap: 8 (ref 5–15)
BUN: 18 mg/dL (ref 8–23)
CO2: 17 mmol/L — ABNORMAL LOW (ref 22–32)
Calcium: 8 mg/dL — ABNORMAL LOW (ref 8.9–10.3)
Chloride: 113 mmol/L — ABNORMAL HIGH (ref 98–111)
Creatinine, Ser: 1.18 mg/dL (ref 0.61–1.24)
GFR, Estimated: >60 mL/min (ref >60)
Glucose, Bld: 92 mg/dL (ref 70–99)
Potassium: 3.3 mmol/L — ABNORMAL LOW (ref 3.5–5.1)
Sodium: 138 mmol/L (ref 135–145)

## 2023-07-23 MED ORDER — ADULT MULTIVITAMIN W/MINERALS CH
1.0000 | ORAL_TABLET | Freq: Every day | ORAL | 0 refills | Status: AC
  Filled 2023-07-23: qty 30, 30d supply, fill #0

## 2023-07-23 MED ORDER — SUCRALFATE 1 G PO TABS
1.0000 g | ORAL_TABLET | Freq: Four times a day (QID) | ORAL | 0 refills | Status: AC
  Filled 2023-07-23: qty 40, 10d supply, fill #0

## 2023-07-23 MED ORDER — FOLIC ACID 1 MG PO TABS
1.0000 mg | ORAL_TABLET | Freq: Every day | ORAL | 0 refills | Status: AC
  Filled 2023-07-23: qty 30, 30d supply, fill #0

## 2023-07-23 MED ORDER — ENSURE ENLIVE PO LIQD: 237.0000 mL | Freq: Two times a day (BID) | ORAL | Status: AC

## 2023-07-23 MED ORDER — PROSOURCE PLUS PO LIQD: 30.0000 mL | Freq: Two times a day (BID) | ORAL | Status: AC

## 2023-07-23 MED ORDER — MIDODRINE HCL 10 MG PO TABS
10.0000 mg | ORAL_TABLET | Freq: Three times a day (TID) | ORAL | 0 refills | Status: AC
Start: 2023-07-23 — End: ?
  Filled 2023-07-23: qty 90, 30d supply, fill #0

## 2023-07-23 MED ORDER — POTASSIUM CHLORIDE CRYS ER 20 MEQ PO TBCR
40.0000 meq | EXTENDED_RELEASE_TABLET | Freq: Four times a day (QID) | ORAL | Status: DC
  Administered 2023-07-23: 40 meq via ORAL
  Filled 2023-07-23: qty 2

## 2023-07-23 MED ORDER — THIAMINE HCL 100 MG PO TABS
100.0000 mg | ORAL_TABLET | Freq: Every day | ORAL | 0 refills | Status: AC
Start: 2023-07-24 — End: ?
  Filled 2023-07-23: qty 30, 30d supply, fill #0

## 2023-07-23 MED ORDER — PANTOPRAZOLE SODIUM 40 MG PO TBEC
40.0000 mg | DELAYED_RELEASE_TABLET | Freq: Two times a day (BID) | ORAL | 0 refills | Status: AC
  Filled 2023-07-23: qty 60, 30d supply, fill #0

## 2023-07-23 NOTE — Progress Notes (Signed)
   07/23/23 0855  Mobility  Activity Ambulated with assistance in hallway  Level of Assistance Standby assist, set-up cues, supervision of patient - no hands on  Assistive Device Front wheel walker  Distance Ambulated (ft) 150 ft  Activity Response Tolerated fair  Mobility Referral Yes  Mobility visit 1 Mobility  Mobility Specialist Start Time (ACUTE ONLY) 1010  Mobility Specialist Stop Time (ACUTE ONLY) 1035  Mobility Specialist Time Calculation (min) (ACUTE ONLY) 25 min   Mobility Specialist: Progress Note  Pre-Mobility:      HR 72 During Mobility: HR 110 Post-Mobility:    HR 94  Pt agreeable to mobility session - received in bed. C/o both feet aching. Returned to bed with all needs met - call bell within reach. Bed alarm on.   [name], BS Mobility Specialist Please contact via SecureChat or  Rehab office at [phone].

## 2023-07-23 NOTE — Plan of Care (Signed)
 Pt has rested quietly throughout the night with no distress noted. Alert and oriented. On room air. SR on the monitor. Voids per urinal. No complaints voiced.     Problem: Clinical Measurements: Goal: Respiratory complications will improve Outcome: Progressing Goal: Cardiovascular complication will be avoided Outcome: Progressing   Problem: Pain Managment: Goal: General experience of comfort will improve and/or be controlled Outcome: Progressing

## 2023-07-23 NOTE — Discharge Instructions (Addendum)
 Follow with Primary MD Entzminger, Iantha Mainland, MD in 7 days   Get CBC, CMP, checked  by Primary MD next visit.    Activity: As tolerated with Full fall precautions use walker/cane & assistance as needed   Disposition Home    Diet: Heart Healthy , with feeding assistance and aspiration precautions.  For Heart failure patients - Check your Weight same time everyday, if you gain over 2 pounds, or you develop in leg swelling, experience more shortness of breath or chest pain, call your Primary MD immediately. Follow Cardiac Low Salt Diet and 1.5 lit/day fluid restriction.   On your next visit with your primary care physician please Get Medicines reviewed and adjusted.   Please request your Prim.MD to go over all Hospital Tests and Procedure/Radiological results at the follow up, please get all Hospital records sent to your Prim MD by signing hospital release before you go home.   If you experience worsening of your admission symptoms, develop shortness of breath, life threatening emergency, suicidal or homicidal thoughts you must seek medical attention immediately by calling 911 or calling your MD immediately  if symptoms less severe.  You Must read complete instructions/literature along with all the possible adverse reactions/side effects for all the Medicines you take and that have been prescribed to you. Take any new Medicines after you have completely understood and accpet all the possible adverse reactions/side effects.   Do not drive, operating heavy machinery, perform activities at heights, swimming or participation in water activities or provide baby sitting services if your were admitted for syncope or siezures until you have seen by Primary MD or a Neurologist and advised to do so again.  Do not drive when taking Pain medications.    Do not take more than prescribed Pain, Sleep and Anxiety Medications  Special Instructions: If you have smoked or chewed Tobacco  in the last 2  yrs please stop smoking, stop any regular Alcohol  and or any Recreational drug use.  Wear Seat belts while driving.   Please note  You were cared for by a hospitalist during your hospital stay. If you have any questions about your discharge medications or the care you received while you were in the hospital after you are discharged, you can call the unit and asked to speak with the hospitalist on call if the hospitalist that took care of you is not available. Once you are discharged, your primary care physician will handle any further medical issues. Please note that NO REFILLS for any discharge medications will be authorized once you are discharged, as it is imperative that you return to your primary care physician (or establish a relationship with a primary care physician if you do not have one) for your aftercare needs so that they can reassess your need for medications and monitor your lab values.

## 2023-07-23 NOTE — TOC Transition Note (Signed)
 Transition of Care [facility]) - Discharge Note   Patient Details  Name: Marvin Ortega MRN: [id number] Date of Birth: August 10, 1950  Transition of [facility]) CM/SW Contact:  Eusebio High, RN Phone Number: 07/23/2023, 11:08 AM   Clinical Narrative:     Patient will DC today to S/O home' Marvin Orte[facility]. [phone]  [address] [place], [place]  [address]  Marvin Ortega will be picking up patient today. [facility] will provide HH PT and OT . AVS updated   No additional TOC needs           Patient Goals and CMS Choice            Discharge Placement                       Discharge Plan and Services Additional resources added to the After Visit Summary for                                       Social Drivers of Health (SDOH) Interventions SDOH Screenings   Food Insecurity: No Food Insecurity (07/17/2023)  Housing: Low Risk  (07/17/2023)  Transportation Needs: No Transportation Needs (07/17/2023)  Utilities: Not At Risk (07/17/2023)  Social Connections: Patient Declined (07/17/2023)  Tobacco Use: Low Risk  (07/18/2023)     Readmission Risk Interventions     No data to display

## 2023-07-23 NOTE — Discharge Summary (Signed)
 Physician Discharge Summary  Marvin Ortega:[id number] DOB: 09-06-1950 DOA: 07/15/2023  PCP: Benuel Brazier, MD  Admit date: 07/15/2023 Discharge date: 07/23/2023  Admitted From: (Home) Disposition:  (Home )  Recommendations for Outpatient Follow-up:  Follow up with PCP in 1-2 weeks Please obtain BMP/CBC in one week   Home Health: (YES/NO)  Diet recommendation: Heart Healthy   Brief/Interim Summary: 73 y.o. male with medical history significant of  alcoholic liver disease, anemia, paroxysmal A-fib not on anticoagulation due to presumed alcohol use, BPH, COPD, GERD, history of GI bleed, hypertension, prediabetes presented to the ED with complaints of generalized weakness/fatigue and poor p.o. intake iso thickened cecum c/f infection vs malignancy on imaging.  Patient is admitted for further workup, please see discussion below  GI bleed anemia Duodenal ulcer/erosive gastritis/internal hemorrhoids -Pt with fatigue and poor PO intake, as well as low albumin, appears frail and cachectic, so has significant anemia, imaging suggestive of thickening in the cecum colitis versus malignancy. -Presentation suspicious for severe protein calorie malnutrition, underwent EGD and colonoscopy on 07/18/2023, EGD showing 2 duodenal bulb ulcers, colonoscopy showed diverticulosis with internal hemorrhoids  - he is s/p 1 unit of packed RBC transfusion on 07/17/2023, question intermittent chronic occult blood loss.   Recommendations per GI --Continue BID PO Pantoprazole .  --Continue Carafate for total of 10 days -- Continue Anusol cream for hemorrhoids for total of 7 days   Thickened cecum /Presumed colitis(colitis ruled out)-  Please review above, GI on board, placed on Rocephin  and Flagyl  in case this is colitis.  Antibiotic has been stopped per GI recommendations  -Abnormal CT scan with mucosal thickening of the cecum and ascending colon. No abnormalities on colonoscopy this admission and biopsies  normal.    AKI - Cr 1.9 on admission. Baseline 1.0. Presumed pre-renal.  Transfused, hydrated, renal function improving renal ultrasound nonacute.  This has resolved.   Macrocytic anemia -  Stable B12, folate and TSH.  This is secondary to alcohol abuse   Hypomagnesemia hypophosphatemia.  Replaced.   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 Normal mentation.  Platelets are low in absence of splenomegaly HCC screening: Normal AFP. No obvious liver lesions on CT Varices screening; none on EGD this admission.    Alcohol abuse -He was kept on CIWA protocol, discharged on Librium taper   Severe PCM.  Protein supplement.   Dyslipidemia.  On statin.  Hypertension/hypotension - Does appear blood pressure chronically soft, he was started on midodrine, metoprolol  has been held during hospital stay and discontinued on discharge  Nonspecific substernal pressure.  Likely reflux related, EKG and troponin x 2 stable, blood pressure too low for nitroglycerin trial, trial of Maalox and monitor.  Discharge Diagnoses:  Principal Problem:   Colitis Active Problems:   Anemia   AKI (acute kidney injury) (HCC)   FTT (failure to thrive) in adult   Hematochezia   Abnormal finding on GI tract imaging   Generalized abdominal pain   Loss of weight   Protein-calorie malnutrition, severe   Hiatal hernia   Duodenal ulcer   Gastritis and gastroduodenitis   Nausea and vomiting   Diverticulosis of colon without hemorrhage   Grade II internal hemorrhoids    Discharge Instructions  Discharge Instructions     Diet - low sodium heart healthy   Complete by: As directed    Discharge instructions   Complete by: As directed    Follow with Primary MD Entzminger,  Iantha Mainland, MD in 7 days   Get CBC, CMP, checked  by Primary MD next visit.    Activity: As tolerated with Full fall precautions use walker/cane & assistance as  needed   Disposition Home    Diet: Heart Healthy , with feeding assistance and aspiration precautions.  For Heart failure patients - Check your Weight same time everyday, if you gain over 2 pounds, or you develop in leg swelling, experience more shortness of breath or chest pain, call your Primary MD immediately. Follow Cardiac Low Salt Diet and 1.5 lit/day fluid restriction.   On your next visit with your primary care physician please Get Medicines reviewed and adjusted.   Please request your Prim.MD to go over all Hospital Tests and Procedure/Radiological results at the follow up, please get all Hospital records sent to your Prim MD by signing hospital release before you go home.   If you experience worsening of your admission symptoms, develop shortness of breath, life threatening emergency, suicidal or homicidal thoughts you must seek medical attention immediately by calling 911 or calling your MD immediately  if symptoms less severe.  You Must read complete instructions/literature along with all the possible adverse reactions/side effects for all the Medicines you take and that have been prescribed to you. Take any new Medicines after you have completely understood and accpet all the possible adverse reactions/side effects.   Do not drive, operating heavy machinery, perform activities at heights, swimming or participation in water activities or provide baby sitting services if your were admitted for syncope or siezures until you have seen by Primary MD or a Neurologist and advised to do so again.  Do not drive when taking Pain medications.    Do not take more than prescribed Pain, Sleep and Anxiety Medications  Special Instructions: If you have smoked or chewed Tobacco  in the last 2 yrs please stop smoking, stop any regular Alcohol  and or any Recreational drug use.  Wear Seat belts while driving.   Please note  You were cared for by a hospitalist during your hospital stay.  If you have any questions about your discharge medications or the care you received while you were in the hospital after you are discharged, you can call the unit and asked to speak with the hospitalist on call if the hospitalist that took care of you is not available. Once you are discharged, your primary care physician will handle any further medical issues. Please note that NO REFILLS for any discharge medications will be authorized once you are discharged, as it is imperative that you return to your primary care physician (or establish a relationship with a primary care physician if you do not have one) for your aftercare needs so that they can reassess your need for medications and monitor your lab values.   Increase activity slowly   Complete by: As directed       Allergies as of 07/23/2023   No Known Allergies      Medication List     STOP taking these medications    aspirin  EC 81 MG tablet   atorvastatin  20 MG tablet Commonly known as: LIPITOR   metoprolol  succinate 25 MG 24 hr tablet Commonly known as: TOPROL -XL   spironolactone  50 MG tablet Commonly known as: ALDACTONE        TAKE these medications    (feeding supplement) PROSource Plus liquid Take 30 mLs by mouth 2 (two) times daily at 8 am and 10 pm.   feeding  supplement Liqd Take 237 mLs by mouth 2 (two) times daily between meals.   albuterol  108 (90 Base) MCG/ACT inhaler Commonly known as: VENTOLIN  HFA Inhale 2 puffs into the lungs every 6 (six) hours as needed for wheezing or shortness of breath.   CertaVite/Antioxidants Tabs Take 1 tablet by mouth daily. Start taking on: Jul 24, 2023   cyanocobalamin  1000 MCG tablet Commonly known as: VITAMIN B12 Take 1,000 mcg by mouth daily.   finasteride  5 MG tablet Commonly known as: PROSCAR  Take 5 mg by mouth every morning.   folic acid  1 MG tablet Commonly known as: FOLVITE  Take 1 tablet (1 mg total) by mouth daily.   midodrine 10 MG tablet Commonly known  as: PROAMATINE Take 1 tablet (10 mg total) by mouth 3 (three) times daily with meals.   pantoprazole  40 MG tablet Commonly known as: PROTONIX  Take 1 tablet (40 mg total) by mouth 2 (two) times daily. What changed: when to take this   sucralfate 1 g tablet Commonly known as: Carafate Take 1 tablet (1 g total) by mouth 4 (four) times daily for 10 days.   thiamine  100 MG tablet Commonly known as: VITAMIN B1 Take 1 tablet (100 mg total) by mouth daily. Start taking on: Jul 24, 2023        Follow-up Information     [facility] Follow [facility].   Specialty: Home Health Services Why: [facility] will Contact S/O within 48 hours of discharge to arrange a home health visit Contact information: [address] [place] [address] [phone]                No Known Allergies  Consultations: Gastroenterology   Procedures/Studies: DG Chest Port 1 View Result Date: 07/21/2023 CLINICAL DATA:  73 year old male with shortness of breath. EXAM: PORTABLE CHEST 1 VIEW COMPARISON:  Chest radiographs and CT Abdomen and Pelvis 07/15/2023 and earlier. FINDINGS: Portable AP semi upright view at 0628 hours. Lower lung volumes. Tortuous descending thoracic aorta and small hiatal hernia confirmed on 2022 chest CT, recent CT Abdomen and Pelvis. Stable cardiac size and mediastinal contours. Visualized tracheal air column is within normal limits. Allowing for portable technique the lungs are clear. No pneumothorax or pleural effusion. Negative visible bowel gas. Stable visualized osseous structures. IMPRESSION: Lower lung volumes, no acute cardiopulmonary abnormality. Electronically Signed   By: Marlise Simpers M.D.   On: 07/21/2023 07:08   US  RENAL Result Date: 07/17/2023 CLINICAL DATA:  Acute kidney injury EXAM: RENAL / URINARY TRACT ULTRASOUND COMPLETE COMPARISON:  CT abdomen pelvis 07/15/2023 FINDINGS: Right Kidney: Renal measurements: 9.4 x 5.2 x 4.5 cm = volume: 112 mL. Echogenicity  within normal limits. No mass or hydronephrosis visualized. Left Kidney: Renal measurements: 10.0 x 4.2 x 4.5 cm = volume: 96 mL. Echogenicity within normal limits. No mass or hydronephrosis visualized. 12 mm calculus seen in the lower pole. Bladder: Appears normal for degree of bladder distention. Other: Minimal perihepatic and pelvic floor ascites is present. Nodular patent contours consistent with cirrhosis. IMPRESSION: 1. No acute abnormality of the kidneys. 2. 12 mm nonobstructing left renal calculus. Electronically Signed   By: Elester Grim M.D.   On: 07/17/2023 11:37   CT Head Wo Contrast Result Date: 07/15/2023 CLINICAL DATA:  Head trauma, altered mental status (Ped 0-17y) had a fall, hx of etoh use, fatigue, EXAM: CT HEAD WITHOUT CONTRAST TECHNIQUE: Contiguous axial images were obtained from the base of the skull through the vertex without intravenous contrast. RADIATION  DOSE REDUCTION: This exam was performed according to the departmental dose-optimization program which includes automated exposure control, adjustment of the mA and/or kV according to patient size and/or use of iterative reconstruction technique. COMPARISON:  February 05, 2009 FINDINGS: Brain: Proportional prominence of the ventricles and sulci, consistent with diffuse cerebral parenchymal volume loss, which has significantly progressed in the interim. The ventricles otherwise maintained midline position without midline shift. 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 No evidence of acute territorial infarction, extra-axial fluid collection, hemorrhage, or mass lesion. The basilar cisterns are patent without downward herniation. The cerebellar hemispheres and vermis are well formed without mass lesion. 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 No cerebellar tonsillar ectopia greater than 5 mm. Vascular: No hyperdense vessel. Skull:  Normal. Negative for fracture or focal lesion. Sinuses/Orbits: The paranasal sinuses and mastoids are clear. The globes appear intact. No retrobulbar hematoma. Other: None. IMPRESSION: 1. No acute intracranial abnormality, specifically, no acute hemorrhage, territorial infarction, or intracranial mass. 2. Significantly progressed cerebral volume loss with sequelae of chronic ischemic microvascular disease. Electronically Signed   By: Rance Burrows M.D.   On: 07/15/2023 17:43   CT ABDOMEN PELVIS W CONTRAST Result Date: 07/15/2023 CLINICAL DATA:  Right lower quadrant abdominal pain EXAM: CT ABDOMEN AND PELVIS WITH CONTRAST TECHNIQUE: Multidetector CT imaging of the abdomen and pelvis was performed using the standard protocol following bolus administration of intravenous contrast. RADIATION DOSE REDUCTION: This exam was performed according to the departmental dose-optimization program which includes automated exposure control, adjustment of the mA and/or kV according to patient size and/or use of iterative reconstruction technique. CONTRAST:  75mL OMNIPAQUE  IOHEXOL  300 MG/ML  SOLN COMPARISON:  January 22, 2023 FINDINGS: Lower chest: No acute abnormality. Hepatobiliary: Heterogeneous nodular appearing liver could correlate with hepatocellular disease such as cirrhosis without parenchymal lesions or biliary dilatation. Multiple gallstones without gallbladder inflammatory changes. Small amount of fluid surrounding the liver correlates with minimal ascites. Portal vein is patent. No obvious varices. Pancreas: Unremarkable. No pancreatic ductal dilatation or surrounding inflammatory changes. Spleen: Normal in size without focal abnormality. Adrenals/Urinary Tract: Right kidney is normal. Adrenal glands are normal. Left kidney demonstrates a large calcification in the left renal pelvis measuring 1.4 x 1.4 cm without hydronephrosis consistent with left nephrolithiasis. And loosely formed staghorn calculus. Bladder  unremarkable. Prostate gland within normal limits. Stomach/Bowel: Mucosal thickening of the cecum and ascending colon with subtle stranding of the mesenteric fat in the right lower quadrant with normal-appearing cecal appendix could correlate with colitis. Mild diffuse diverticulosis without diverticulitis Vascular/Lymphatic: Aortic atherosclerosis. No enlarged abdominal or pelvic lymph nodes. Reproductive: Prostate is unremarkable. Other: No abdominal wall hernia or abnormality. No abdominopelvic ascites. Musculoskeletal: No acute or significant osseous findings. IMPRESSION: *Mucosal thickening of the cecum and ascending colon with subtle stranding of the mesenteric fat in the right lower quadrant with normal-appearing cecal appendix could correlate with colitis. *Heterogeneous nodular appearing liver could correlate with hepatocellular disease such as cirrhosis without parenchymal lesions or biliary dilatation. *Multiple gallstones without gallbladder inflammatory changes. *Small amount of fluid surrounding the liver correlates with minimal ascites. *Left nephrolithiasis without hydronephrosis. *Aortic atherosclerosis. Electronically Signed   By: Fredrich Jefferson M.D.   On: 07/15/2023 17:29   DG Chest 2 View Result Date: 07/15/2023 CLINICAL DATA:  May 6 2 in 25 EXAM: CHEST - 2 VIEW COMPARISON:  None Available. FINDINGS: The heart size and mediastinal contours are within normal limits. Both lungs are clear. The  visualized skeletal structures are unremarkable. No change in the nodular density of the left upper lobe compared with prior examination earlier today. Correlation with CT chest recommended. IMPRESSION: *No acute cardiopulmonary process. *No change in the nodular density of the left upper lobe compared with prior examination earlier today. Correlation with CT chest recommended. Electronically Signed   By: Fredrich Jefferson M.D.   On: 07/15/2023 17:25   DG Chest 2 View Result Date: 07/14/2023 CLINICAL DATA:   Shortness of breath. EXAM: CHEST - 2 VIEW COMPARISON:  06/02/2020. FINDINGS: Redemonstration of pre-existing 1.6 x 1.9 cm nodular opacity overlying the left mid lung zone, essentially similar to the prior study. The opacity corresponds to skin mole on the prior chest CT scan from 07/19/2020. Bilateral lung fields are otherwise essentially clear. No acute consolidation or lung collapse. Bilateral costophrenic angles are clear. Normal cardio-mediastinal silhouette. Note is made of markedly tortuous descending thoracic aorta. No acute osseous abnormalities. The soft tissues are within normal limits. IMPRESSION: No active cardiopulmonary disease. Electronically Signed   By: Beula Brunswick M.D.   On: 07/14/2023 13:49      Subjective: No significant events overnight as discussed with staff, he denies any complaints today, patient ambulated with mobility specialist today with no complaints.  Discharge Exam: Vitals:   07/23/23 0800 07/23/23 1124  BP: (!) 87/64 105/78  Pulse:    Resp: 15 15  Temp: 97.8 F (36.6 C)   SpO2:     Vitals:   07/22/23 2337 07/23/23 0341 07/23/23 0800 07/23/23 1124  BP: (!) 90/57 109/83 (!) 87/64 105/78  Pulse: 75 79    Resp: 15 17 15 15   Temp: 98.2 F (36.8 C) 98 F (36.7 C) 97.8 F (36.6 C)   TempSrc: Oral Oral Axillary   SpO2:      Weight:      Height:        General: Pt is alert, awake, not in acute distress Cardiovascular: RRR, S1/S2 +, no rubs, no gallops Respiratory: CTA bilaterally, no wheezing, no rhonchi Abdominal: Soft, NT, ND, bowel sounds + Extremities: no edema, no cyanosis    The results of significant diagnostics from this hospitalization (including imaging, microbiology, ancillary and laboratory) are listed below for reference.     Microbiology: Recent Results (from the past 240 hours)  Blood culture (routine x 2)     Status: None   Collection Time: 07/15/23  4:05 PM   Specimen: BLOOD  Result Value Ref Range Status   Specimen  Description   Final    BLOOD LEFT ANTECUBITAL Performed at [facility], [address], [place], [place] [address]    Special Requests   Final    Blood Culture adequate volume BOTTLES DRAWN AEROBIC AND ANAEROBIC Performed at [facility], [address] [place], [place] [address]    Culture   Final    NO GROWTH 5 DAYS Performed at [facility], [address], [place], [place] [address]    Report Status 07/20/2023 FINAL  Final  Blood culture (routine x 2)     Status: None   Collection Time: 07/15/23  4:10 PM   Specimen: BLOOD  Result Value Ref Range Status   Specimen Description   Final    BLOOD RIGHT ANTECUBITAL Performed at [facility], [address], [place], [place] [address]    Special Requests   Final    Blood Culture adequate volume BOTTLES DRAWN AEROBIC AND ANAEROBIC Performed at [facility],  [address] [place], [place] [address]    Culture   Final    NO GROWTH 5 DAYS Performed at [facility], [address], [place], [place] [address]    Report Status 07/20/2023 FINAL  Final     Labs: BNP (last 3 results) Recent Labs    07/19/23 0634 07/20/23 0439 07/21/23 0044  BNP 133.1* 784.4* 1,546.2*   Basic Metabolic Panel: Recent Labs  Lab 07/18/23 0637 07/19/23 0634 07/20/23 0439 07/21/23 0044 07/22/23 0433 07/23/23 0446  NA 138 139 135 134* 136 138  K 4.1 4.0 3.7 4.4 3.5 3.3*  CL 115* 117* 110 109 115* 113*  CO2 15* 15* 14* 16* 16* 17*  GLUCOSE 87 72 76 96 89 92  BUN 20 17 20 18 17 18   CREATININE 1.38* 1.24 1.42* 1.23 1.21 1.18  CALCIUM  8.3* 7.9* 8.4* 8.6* 8.2* 8.0*  MG 2.3 1.8 1.6* 2.6* 2.2  --   PHOS 2.9 2.7 1.7* 2.4*  --   --    Liver Function Tests: Recent Labs  Lab 07/18/23 0637 07/19/23 0634 07/20/23 0439 07/21/23 0044 07/22/23 0433  AST 25 21 17 27 21   ALT 9 8 7 9 8   ALKPHOS 41 37* 25* 25* 26*  BILITOT 1.4* 0.8 0.9 2.2* 0.9  PROT 5.7*  4.9* 5.0* 5.8* 5.1*  ALBUMIN 1.8* 1.7* 3.0* 3.7 2.7*   No results for input(s): "LIPASE", "AMYLASE" in the last 168 hours. No results for input(s): "AMMONIA" in the last 168 hours. CBC: Recent Labs  Lab 07/18/23 0637 07/19/23 0634 07/20/23 0439 07/20/23 1018 07/20/23 1535 07/21/23 0044 07/22/23 0433 07/23/23 0446  WBC 6.3 7.4 4.7 4.9 5.0 5.5 6.4 6.7  NEUTROABS 5.9 6.7 3.1  --   --  4.0 4.7  --   HGB 9.7* 9.3* 6.6* 8.1* 7.4* 8.9* 8.5* 8.2*  HCT 28.5* 27.3* 19.8* 25.1* 22.5* 25.8* 24.2* 23.3*  MCV 110.0* 112.8* 113.1* 116.7* 116.6* 104.9* 102.5* 103.1*  PLT 139* 116* 83* 114* 89* 92* 92* 92*   Cardiac Enzymes: No results for input(s): "CKTOTAL", "CKMB", "CKMBINDEX", "TROPONINI" in the last 168 hours. BNP: Invalid input(s): "POCBNP" CBG: Recent Labs  Lab 07/21/23 2122 07/22/23 0031 07/22/23 0422  GLUCAP 136* 109* 91   D-Dimer No results for input(s): "DDIMER" in the last 72 hours. Hgb A1c No results for input(s): "HGBA1C" in the last 72 hours. Lipid Profile No results for input(s): "CHOL", "HDL", "LDLCALC", "TRIG", "CHOLHDL", "LDLDIRECT" in the last 72 hours. Thyroid function studies No results for input(s): "TSH", "T4TOTAL", "T3FREE", "THYROIDAB" in the last 72 hours.  Invalid input(s): "FREET3" Anemia work up No results for input(s): "VITAMINB12", "FOLATE", "FERRITIN", "TIBC", "IRON", "RETICCTPCT" in the last 72 hours. Urinalysis    Component Value Date/Time   COLORURINE YELLOW 07/15/2023 1613   APPEARANCEUR CLEAR 07/15/2023 1613   APPEARANCEUR Hazy (A) 04/09/2023 1021   LABSPEC 1.018 07/15/2023 1613   PHURINE 5.5 07/15/2023 1613   GLUCOSEU NEGATIVE 07/15/2023 1613   HGBUR MODERATE (A) 07/15/2023 1613   BILIRUBINUR NEGATIVE 07/15/2023 1613   BILIRUBINUR Negative 04/09/2023 1021   KETONESUR NEGATIVE 07/15/2023 1613   PROTEINUR TRACE (A) 07/15/2023 1613   NITRITE NEGATIVE 07/15/2023 1613   LEUKOCYTESUR LARGE (A) 07/15/2023 1613   Sepsis Labs Recent Labs   Lab 07/20/23 1535 07/21/23 0044 07/22/23 0433 07/23/23 0446  WBC 5.0 5.5 6.4 6.7   Microbiology Recent Results (from the past 240 hours)  Blood culture (routine x 2)     Status: None   Collection  Time: 07/15/23  4:05 PM   Specimen: BLOOD  Result Value Ref Range Status   Specimen Description   Final    BLOOD LEFT ANTECUBITAL Performed at [facility], [address], [place], [place] [address]    Special Requests   Final    Blood Culture adequate volume BOTTLES DRAWN AEROBIC AND ANAEROBIC Performed at [facility], [address] [place], [place] [address]    Culture   Final    NO GROWTH 5 DAYS Performed at [facility], [address] [place], [place] [address]    Report Status 07/20/2023 FINAL  Final  Blood culture (routine x 2)     Status: None   Collection Time: 07/15/23  4:10 PM   Specimen: BLOOD  Result Value Ref Range Status   Specimen Description   Final    BLOOD RIGHT ANTECUBITAL Performed at [facility], [address] [place], [place] [address]    Special Requests   Final    Blood Culture adequate volume BOTTLES DRAWN AEROBIC AND ANAEROBIC Performed at [facility], [address] [place], [place] [address]    Culture   Final    NO GROWTH 5 DAYS Performed at [facility], [address], [place], [place] [address]    Report Status 07/20/2023 FINAL  Final     Time coordinating discharge: Over 30 minutes  SIGNED:   Seena Dadds, MD  [facility] 07/23/2023, 12:58 PM Pager   If 7PM-7AM, please contact night-coverage [email or web address]

## 2023-09-01 ENCOUNTER — Other Ambulatory Visit (HOSPITAL_COMMUNITY): Payer: Self-pay

## 2023-09-02 ENCOUNTER — Other Ambulatory Visit (HOSPITAL_COMMUNITY): Payer: Self-pay

## 2023-09-06 ENCOUNTER — Emergency Department

## 2023-09-06 ENCOUNTER — Emergency Department
Admission: EM | Admit: 2023-09-06 | Discharge: 2023-09-06 | Disposition: A | Discharge status: Home / Self Care | Attending: Emergency Medicine | Admitting: Emergency Medicine

## 2023-09-06 ENCOUNTER — Other Ambulatory Visit: Payer: Self-pay

## 2023-09-06 DIAGNOSIS — I10 Essential (primary) hypertension: Secondary | ICD-10-CM | POA: Diagnosis not present

## 2023-09-06 DIAGNOSIS — R1084 Generalized abdominal pain: Secondary | ICD-10-CM | POA: Diagnosis present

## 2023-09-06 DIAGNOSIS — J449 Chronic obstructive pulmonary disease, unspecified: Secondary | ICD-10-CM | POA: Diagnosis not present

## 2023-09-06 DIAGNOSIS — Z7901 Long term (current) use of anticoagulants: Secondary | ICD-10-CM | POA: Diagnosis not present

## 2023-09-06 DIAGNOSIS — K7031 Alcoholic cirrhosis of liver with ascites: Secondary | ICD-10-CM | POA: Diagnosis not present

## 2023-09-06 HISTORY — DX: Disorder of kidney and ureter, unspecified: N28.9

## 2023-09-06 LAB — CBC
HCT: 29.8 % — ABNORMAL LOW (ref 39.0–52.0)
Hemoglobin: 9.9 g/dL — ABNORMAL LOW (ref 13.0–17.0)
MCH: 36.1 pg — ABNORMAL HIGH (ref 26.0–34.0)
MCHC: 33.2 g/dL (ref 30.0–36.0)
MCV: 108.8 fL — ABNORMAL HIGH (ref 80.0–100.0)
Platelets: 171 10*3/uL (ref 150–400)
RBC: 2.74 MIL/uL — ABNORMAL LOW (ref 4.22–5.81)
RDW: 18.8 % — ABNORMAL HIGH (ref 11.5–15.5)
WBC: 5.8 10*3/uL (ref 4.0–10.5)
nRBC: 0 % (ref 0.0–0.2)

## 2023-09-06 LAB — COMPREHENSIVE METABOLIC PANEL WITH GFR
ALT: 15 U/L (ref 0–44)
AST: 38 U/L (ref 15–41)
Albumin: 3.3 g/dL — ABNORMAL LOW (ref 3.5–5.0)
Alkaline Phosphatase: 97 U/L (ref 38–126)
Anion gap: 7 (ref 5–15)
BUN: 24 mg/dL — ABNORMAL HIGH (ref 8–23)
CO2: 22 mmol/L (ref 22–32)
Calcium: 9.4 mg/dL (ref 8.9–10.3)
Chloride: 106 mmol/L (ref 98–111)
Creatinine, Ser: 1 mg/dL (ref 0.61–1.24)
GFR, Estimated: >60 mL/min (ref >60)
Glucose, Bld: 106 mg/dL — ABNORMAL HIGH (ref 70–99)
Potassium: 5 mmol/L (ref 3.5–5.1)
Sodium: 135 mmol/L (ref 135–145)
Total Bilirubin: 1.2 mg/dL (ref 0.0–1.2)
Total Protein: 7.4 g/dL (ref 6.5–8.1)

## 2023-09-06 LAB — URINALYSIS, ROUTINE W REFLEX MICROSCOPIC
Bacteria, UA: NONE SEEN
Bilirubin Urine: NEGATIVE
Glucose, UA: NEGATIVE mg/dL
Hgb urine dipstick: NEGATIVE
Ketones, ur: NEGATIVE mg/dL
Nitrite: NEGATIVE
Protein, ur: NEGATIVE mg/dL
Specific Gravity, Urine: 1.015 (ref 1.005–1.030)
pH: 6 (ref 5.0–8.0)

## 2023-09-06 LAB — LIPASE, BLOOD: Lipase: 69 U/L — ABNORMAL HIGH (ref 11–51)

## 2023-09-06 MED ORDER — SODIUM CHLORIDE 0.9 % IV BOLUS
1000.0000 mL | Freq: Once | INTRAVENOUS | Status: AC
  Administered 2023-09-06: 1000 mL via INTRAVENOUS

## 2023-09-06 MED ORDER — ONDANSETRON HCL 4 MG/2ML IJ SOLN
4.0000 mg | Freq: Once | INTRAMUSCULAR | Status: AC
  Administered 2023-09-06: 4 mg via INTRAVENOUS
  Filled 2023-09-06: qty 2

## 2023-09-06 MED ORDER — IOHEXOL 300 MG/ML  SOLN
80.0000 mL | Freq: Once | INTRAMUSCULAR | Status: AC | PRN
  Administered 2023-09-06: 80 mL via INTRAVENOUS

## 2023-09-06 MED ORDER — MORPHINE SULFATE (PF) 4 MG/ML IV SOLN
4.0000 mg | Freq: Once | INTRAVENOUS | Status: AC
  Administered 2023-09-06: 4 mg via INTRAVENOUS
  Filled 2023-09-06: qty 1

## 2023-09-06 NOTE — ED Triage Notes (Signed)
 First nurse note: Pt here via [facility] home with c/o of ABD pain since 03/11/23, pain worse today inhibiting him to walk.   133/87 HR: 82 100% RA  Pt claims abdominal pain is since this AM. Pain is to mid abdomen. Pt unable to describe pain. Pain is bilateral and lower.

## 2023-09-06 NOTE — ED Triage Notes (Signed)
 First nurse note: Pt here via [facility] home with c/o of ABD pain since 03/11/23, pain worse today inhibiting him to walk.  133/87 HR: 82 100% RA

## 2023-09-06 NOTE — ED Notes (Signed)
 Pt to CT

## 2023-09-06 NOTE — ED Provider Notes (Signed)
 [facility] Provider Note    Event Date/Time   First MD Initiated Contact with Patient 09/06/23 1054     (approximate)   History   Chief Complaint Abdominal Pain   HPI  Marvin Ortega is a 73 y.o. male with past medical history of hypertension, atrial fibrillation, COPD, alcohol abuse, and cirrhosis who presents to the ED complaining of abdominal pain.  Patient reports that he has been dealing with intermittent mild abdominal pain for multiple months, but states it got acutely worse earlier today.  He describes sharp pain across his entire abdomen, which seems to be worse in his upper abdomen.  He has been feeling nauseous but has not vomited, denies any changes in his bowel movements.  He denies any dysuria, hematuria, or flank pain.  He has not had any pain in his chest or difficulty breathing, denies blood in his stool.  He does admit to ongoing alcohol consumption, describes a small amount of liquor each day.     Physical Exam   Triage Vital Signs: ED Triage Vitals  Encounter Vitals Group     BP 09/06/23 1019 (!) 140/81     Girls Systolic BP Percentile --      Girls Diastolic BP Percentile --      Boys Systolic BP Percentile --      Boys Diastolic BP Percentile --      Pulse Rate 09/06/23 1019 78     Resp 09/06/23 1019 20     Temp 09/06/23 1019 98.4 F (36.9 C)     Temp Source 09/06/23 1019 Oral     SpO2 09/06/23 1019 97 %     Weight 09/06/23 1018 115 lb (52.2 kg)     Height 09/06/23 1018 5' 6 (1.676 m)     Head Circumference --      Peak Flow --      Pain Score 09/06/23 1019 5     Pain Loc --      Pain Education --      Exclude from Growth Chart --     Most recent vital signs: Vitals:   09/06/23 1019 09/06/23 1112  BP: (!) 140/81   Pulse: 78   Resp: 20   Temp: 98.4 F (36.9 C) 98.1 F (36.7 C)  SpO2: 97%     Constitutional: Alert and oriented. Eyes: Conjunctivae are normal. Head: Atraumatic. Nose: No  congestion/rhinnorhea. Mouth/Throat: Mucous membranes are moist.  Cardiovascular: Normal rate, regular rhythm. Grossly normal heart sounds.  2+ radial pulses bilaterally. Respiratory: Normal respiratory effort.  No retractions. Lungs CTAB. Gastrointestinal: Soft and diffusely tender to palpation with no rebound or guarding. No distention. Musculoskeletal: No lower extremity tenderness nor edema.  Neurologic:  Normal speech and language. No gross focal neurologic deficits are appreciated.    ED Results / Procedures / Treatments   Labs (all labs ordered are listed, but only abnormal results are displayed) Labs Reviewed  LIPASE, BLOOD - Abnormal; Notable for the following components:      Result Value   Lipase 69 (*)    All other components within normal limits  COMPREHENSIVE METABOLIC PANEL WITH GFR - Abnormal; Notable for the following components:   Glucose, Bld 106 (*)    BUN 24 (*)    Albumin  3.3 (*)    All other components within normal limits  CBC - Abnormal; Notable for the following components:   RBC 2.74 (*)    Hemoglobin 9.9 (*)    HCT  29.8 (*)    MCV 108.8 (*)    MCH 36.1 (*)    RDW 18.8 (*)    All other components within normal limits  URINALYSIS, ROUTINE W REFLEX MICROSCOPIC    RADIOLOGY CT abdomen/pelvis reviewed and interpreted by me with no infiltrate, edema, or effusion.  PROCEDURES:  Critical Care performed: No  Procedures   MEDICATIONS ORDERED IN ED: Medications  morphine  (PF) 4 MG/ML injection 4 mg (4 mg Intravenous Given 09/06/23 1134)  ondansetron  (ZOFRAN ) injection 4 mg (4 mg Intravenous Given 09/06/23 1134)  sodium chloride  0.9 % bolus 1,000 mL (0 mLs Intravenous Stopped 09/06/23 1240)  iohexol  (OMNIPAQUE ) 300 MG/ML solution 80 mL (80 mLs Intravenous Contrast Given 09/06/23 1245)     IMPRESSION / MDM / ASSESSMENT AND PLAN / ED COURSE  I reviewed the triage vital signs and the nursing notes.                              73 y.o. male with past  medical history of hypertension, atrial fibrillation, COPD, alcohol abuse, and cirrhosis who presents to the ED with diffuse abdominal pain starting earlier this morning.  Patient's presentation is most consistent with acute presentation with potential threat to life or bodily function.  Differential diagnosis includes, but is not limited to, bowel obstruction, pancreatitis, hepatitis, cholecystitis, biliary colic, anemia, electrolyte abnormality, AKI.  Patient nontoxic-appearing and in no acute distress, vital signs are unremarkable.  His abdomen is soft but he does have diffuse tenderness to palpation, will further assess with CT imaging.  Lab results are pending at this time, will treat symptomatically with IV morphine  and Zofran , hydrate with IV fluids and reassess.  Labs with stable chronic anemia, no significant leukocytosis, electrolyte abnormality, or AKI.  He has mild elevation in his lipase, LFTs are unremarkable.  CT abdomen/pelvis shows cirrhosis and varices as well as signs of chronic colitis, but no acute finding noted.  He does have gallstones but no finding concerning for cholecystitis.  On reassessment, patient's pain is improved and he is tolerating oral intake without difficulty.  He is appropriate for outpatient follow-up with GI and PCP, referrals provided.  He was counseled to cut back on alcohol consumption and to return to the ED for new or worsening symptoms, patient and spouse agree with plan.      FINAL CLINICAL IMPRESSION(S) / ED DIAGNOSES   Final diagnoses:  Generalized abdominal pain  Alcoholic cirrhosis of liver with ascites (HCC)     Rx / DC Orders   ED Discharge Orders     None        Note:  This document was prepared using Dragon voice recognition software and may include unintentional dictation errors.   Willo Dunnings, MD 09/06/23 1330

## 2023-09-06 NOTE — ED Notes (Signed)
 Fall precautions in place for Pt. This RN placed fall band, fall grip socks, bed alarm and fall sign.

## 2023-09-10 ENCOUNTER — Other Ambulatory Visit (HOSPITAL_COMMUNITY): Payer: Self-pay

## 2024-02-01 ENCOUNTER — Other Ambulatory Visit (HOSPITAL_COMMUNITY): Payer: Self-pay
